# Patient Record
Sex: Male | Born: 1990 | Race: White | Hispanic: No | Marital: Single | Smoking: Former smoker
Health system: Southern US, Community
[De-identification: ages and names within clinical notes are randomized; demographics above are authoritative.]

## PROBLEM LIST (undated history)

## (undated) ENCOUNTER — Ambulatory Visit: Payer: Self-pay

## (undated) DIAGNOSIS — F988 Other specified behavioral and emotional disorders with onset usually occurring in childhood and adolescence: Secondary | ICD-10-CM

## (undated) DIAGNOSIS — Z789 Other specified health status: Secondary | ICD-10-CM

## (undated) DIAGNOSIS — F1910 Other psychoactive substance abuse, uncomplicated: Secondary | ICD-10-CM

## (undated) HISTORY — PX: NO PAST SURGERIES: SHX2092

---
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
## 2011-02-25 NOTE — Op Note (Signed)
NAMENIEVES, BARBERI               ACCOUNT NO.:  [id number]   MEDICAL RECORD NO.:  [id number]          PATIENT TYPE:  AMB   LOCATION:  DSC                          FACILITY:  [facility]   PHYSICIAN:  Etter Sjogren, M.D.     DATE OF BIRTH:  13-May-1991   DATE OF PROCEDURE:  07/23/2004  DATE OF DISCHARGE:                                 OPERATIVE REPORT   PREOPERATIVE DIAGNOSIS:  Lesion of undetermined behavior skin greater than  0.5 cm.   POSTOPERATIVE DIAGNOSES:  1.  Lesion of undetermined behavior of skin greater than 0.5 cm.  2.  Complicated wound of the lip greater than 1.0 cm.   PROCEDURES:  1.  Excision of lesion of undetermined behavior, lip, greater than 0.5 cm.  2.  Complex closure of greater than 1.0 cm.   SURGEON:  Etter Sjogren, M.D.   ANESTHESIA:  1% Xylocaine with epinephrine plus bicarbonate.   BRIEF HISTORY:  This is a 20 year old with a pigmented lesion that has  enlarged and his dermatologist recommended excision.  The nature of the  procedure and risks were well understood by the mother including scarring  and the possibility of further surgery depending upon healing as well as  final pathology report.  They wish to proceed.   DESCRIPTION OF PROCEDURE:  The patient was placed supine. He was prepped  with Betadine and draped with sterile drapes.  Local anesthesia was  performed.  The excision was performed stopping short of the vermilion  border up the lip.  Thorough irrigation and layered closure with 5-0  Monocryl interrupted deep sutures and 5-0 Monocryl interrupted deep dermal  sutures and 6-0 Prolene simple interrupted sutures.  Antibiotic ointment was  applied.  The patient tolerated the procedure well.  He will be checked in  the office next week.       DB/MEDQ  D:  07/23/2004  T:  07/23/2004  Job:  [id number]

## 2011-02-25 NOTE — Op Note (Signed)
NAMEGEOVANNIE, Brett Sanford               ACCOUNT NO.:  [id number]   MEDICAL RECORD NO.:  [id number]          PATIENT TYPE:  AMB   LOCATION:  DSC                          FACILITY:  [facility]   PHYSICIAN:  Etter Sjogren, M.D.     DATE OF BIRTH:  1991-06-04   DATE OF PROCEDURE:  01/07/2005  DATE OF DISCHARGE:                                 OPERATIVE REPORT   PREOPERATIVE DIAGNOSES:  1.  Lesion, neck, undetermined behavior, pigmented, greater than 0.5 cm.  2.  Lesion, abdomen, 0.5 cm, pigmented, undetermined behavior.   POSTOPERATIVE DIAGNOSES:  1.  Lesion, neck, undetermined behavior, pigmented, greater than 0.5 cm.  2.  Lesion, abdomen, 0.5 cm, pigmented, undetermined behavior.  3.  Complicated open wound, neck, 1.8 cm.  4.  Complicated wound, abdomen, 2.0 cm.   PROCEDURES PERFORMED:  1.  Excision lesion, neck, undetermined behavior, greater than 0.5 cm.  2.  Excision lesion, abdomen, undetermined behavior, 0.5 cm.  3.  Complex wound closure, neck, 1.8 cm.  4.  Complex wound closure, abdomen, 2.0 cm.   ANESTHESIA:  1% Xylocaine with epinephrine plus bicarb.   CLINICAL NOTE:  This 20 year old boy has been seen by his pediatrician with  two pigmented lesions that have enlarged and changed and he has been  recommended that these be excised.  Procedure and risks were discussed with  is mother, and she understood those risks and wished to proceed.   DESCRIPTION OF PROCEDURE:  The patient  was placed supine.  He was prepped  with Betadine and draped with sterile drapes.  Satisfactory local anesthesia  was achieved.  Elliptical incisions were performed.  The wounds were  irrigated thoroughly and excellent hemostasis had been confirmed.  Layered  closure was performed, using 5-0 Monocryl interrupted deep sutures, 5-0  Monocryl interrupted inverted dermal sutures and 6-0 Prolene simple running  suture.  Antibiotic ointment and dry sterile dressing were applied.  The  patient tolerated the  procedure well.   DISPOSITION:  Recheck in six to eight weeks in the office.      DB/MEDQ  D:  01/07/2005  T:  01/07/2005  Job:  [id number]

## 2011-03-09 ENCOUNTER — Emergency Department (HOSPITAL_COMMUNITY)
Admission: EM | Admit: 2011-03-09 | Discharge: 2011-03-10 | Disposition: A | Discharge status: Home / Self Care | Payer: BC Managed Care – PPO | Attending: Emergency Medicine | Admitting: Emergency Medicine

## 2011-03-09 DIAGNOSIS — R55 Syncope and collapse: Secondary | ICD-10-CM | POA: Insufficient documentation

## 2011-03-09 DIAGNOSIS — T50904A Poisoning by unspecified drugs, medicaments and biological substances, undetermined, initial encounter: Secondary | ICD-10-CM | POA: Insufficient documentation

## 2011-03-09 DIAGNOSIS — R51 Headache: Secondary | ICD-10-CM | POA: Insufficient documentation

## 2011-03-09 DIAGNOSIS — T50901A Poisoning by unspecified drugs, medicaments and biological substances, accidental (unintentional), initial encounter: Secondary | ICD-10-CM | POA: Insufficient documentation

## 2011-03-09 LAB — CBC
HCT: 40.1 % (ref 39.0–52.0)
Hemoglobin: 13.7 g/dL (ref 13.0–17.0)
MCH: 29.7 pg (ref 26.0–34.0)
MCHC: 34.2 g/dL (ref 30.0–36.0)
MCV: 87 fL (ref 78.0–100.0)
Platelets: 259 10*3/uL (ref 150–400)
RBC: 4.61 MIL/uL (ref 4.22–5.81)
RDW: 12.8 % (ref 11.5–15.5)
WBC: 11.5 10*3/uL — ABNORMAL HIGH (ref 4.0–10.5)

## 2011-03-09 LAB — RAPID URINE DRUG SCREEN, HOSP PERFORMED
Amphetamines: NOT DETECTED
Barbiturates: NOT DETECTED
Benzodiazepines: NOT DETECTED
Cocaine: NOT DETECTED
Opiates: NOT DETECTED
Tetrahydrocannabinol: NOT DETECTED

## 2011-03-09 LAB — DIFFERENTIAL
Basophils Absolute: 0 10*3/uL (ref 0.0–0.1)
Basophils Relative: 0 % (ref 0–1)
Eosinophils Absolute: 0.1 10*3/uL (ref 0.0–0.7)
Eosinophils Relative: 1 % (ref 0–5)
Lymphocytes Relative: 14 % (ref 12–46)
Lymphs Abs: 1.6 10*3/uL (ref 0.7–4.0)
Monocytes Absolute: 0.9 10*3/uL (ref 0.1–1.0)
Monocytes Relative: 8 % (ref 3–12)
Neutro Abs: 8.9 10*3/uL — ABNORMAL HIGH (ref 1.7–7.7)
Neutrophils Relative %: 77 % (ref 43–77)

## 2011-03-09 LAB — POCT I-STAT, CHEM 8
BUN: 14 mg/dL (ref 6–23)
Calcium, Ion: 1.2 mmol/L (ref 1.12–1.32)
Chloride: 103 mEq/L (ref 96–112)
Creatinine, Ser: 1.3 mg/dL (ref 0.4–1.5)
Glucose, Bld: 85 mg/dL (ref 70–99)
HCT: 42 % (ref 39.0–52.0)
Hemoglobin: 14.3 g/dL (ref 13.0–17.0)
Potassium: 4.4 mEq/L (ref 3.5–5.1)
Sodium: 139 mEq/L (ref 135–145)
TCO2: 26 mmol/L (ref 0–100)

## 2011-05-17 ENCOUNTER — Emergency Department (HOSPITAL_COMMUNITY)
Admission: EM | Admit: 2011-05-17 | Discharge: 2011-05-17 | Disposition: A | Discharge status: Home / Self Care | Payer: BC Managed Care – PPO | Attending: Emergency Medicine | Admitting: Emergency Medicine

## 2011-05-17 DIAGNOSIS — F988 Other specified behavioral and emotional disorders with onset usually occurring in childhood and adolescence: Secondary | ICD-10-CM | POA: Insufficient documentation

## 2011-05-17 DIAGNOSIS — F3289 Other specified depressive episodes: Secondary | ICD-10-CM | POA: Insufficient documentation

## 2011-05-17 DIAGNOSIS — F329 Major depressive disorder, single episode, unspecified: Secondary | ICD-10-CM | POA: Insufficient documentation

## 2011-05-17 DIAGNOSIS — Z1389 Encounter for screening for other disorder: Secondary | ICD-10-CM | POA: Insufficient documentation

## 2011-05-17 LAB — COMPREHENSIVE METABOLIC PANEL
ALT: 49 U/L (ref 0–53)
AST: 39 U/L — ABNORMAL HIGH (ref 0–37)
Albumin: 4.3 g/dL (ref 3.5–5.2)
Alkaline Phosphatase: 69 U/L (ref 39–117)
BUN: 19 mg/dL (ref 6–23)
CO2: 29 mEq/L (ref 19–32)
Calcium: 9.9 mg/dL (ref 8.4–10.5)
Chloride: 103 mEq/L (ref 96–112)
Creatinine, Ser: 0.93 mg/dL (ref 0.50–1.35)
GFR calc Af Amer: >60 mL/min (ref >60)
GFR calc non Af Amer: >60 mL/min (ref >60)
Glucose, Bld: 96 mg/dL (ref 70–99)
Potassium: 3.9 mEq/L (ref 3.5–5.1)
Sodium: 140 mEq/L (ref 135–145)
Total Bilirubin: 0.4 mg/dL (ref 0.3–1.2)
Total Protein: 7.1 g/dL (ref 6.0–8.3)

## 2011-05-17 LAB — RAPID URINE DRUG SCREEN, HOSP PERFORMED
Amphetamines: NOT DETECTED
Barbiturates: NOT DETECTED
Benzodiazepines: NOT DETECTED
Cocaine: NOT DETECTED
Opiates: NOT DETECTED
Tetrahydrocannabinol: NOT DETECTED

## 2011-05-17 LAB — DIFFERENTIAL
Basophils Absolute: 0 10*3/uL (ref 0.0–0.1)
Basophils Relative: 1 % (ref 0–1)
Eosinophils Absolute: 0.2 10*3/uL (ref 0.0–0.7)
Eosinophils Relative: 3 % (ref 0–5)
Lymphocytes Relative: 35 % (ref 12–46)
Lymphs Abs: 2.3 10*3/uL (ref 0.7–4.0)
Monocytes Absolute: 0.7 10*3/uL (ref 0.1–1.0)
Monocytes Relative: 11 % (ref 3–12)
Neutro Abs: 3.2 10*3/uL (ref 1.7–7.7)
Neutrophils Relative %: 50 % (ref 43–77)

## 2011-05-17 LAB — CBC
HCT: 39.5 % (ref 39.0–52.0)
Hemoglobin: 13.8 g/dL (ref 13.0–17.0)
MCH: 30.5 pg (ref 26.0–34.0)
MCHC: 34.9 g/dL (ref 30.0–36.0)
MCV: 87.4 fL (ref 78.0–100.0)
Platelets: 255 10*3/uL (ref 150–400)
RBC: 4.52 MIL/uL (ref 4.22–5.81)
RDW: 12.4 % (ref 11.5–15.5)
WBC: 6.5 10*3/uL (ref 4.0–10.5)

## 2011-05-17 LAB — ETHANOL: Alcohol, Ethyl (B): <11 mg/dL (ref 0–11)

## 2012-07-28 ENCOUNTER — Encounter (HOSPITAL_COMMUNITY): Payer: Self-pay | Admitting: *Deleted

## 2012-07-28 ENCOUNTER — Emergency Department (HOSPITAL_COMMUNITY)
Admission: EM | Admit: 2012-07-28 | Discharge: 2012-07-29 | Disposition: A | Discharge status: Home / Self Care | Payer: BC Managed Care – PPO | Attending: Emergency Medicine | Admitting: Emergency Medicine

## 2012-07-28 DIAGNOSIS — S161XXA Strain of muscle, fascia and tendon at neck level, initial encounter: Secondary | ICD-10-CM

## 2012-07-28 DIAGNOSIS — M542 Cervicalgia: Secondary | ICD-10-CM | POA: Insufficient documentation

## 2012-07-28 DIAGNOSIS — S239XXA Sprain of unspecified parts of thorax, initial encounter: Secondary | ICD-10-CM | POA: Insufficient documentation

## 2012-07-28 DIAGNOSIS — IMO0002 Reserved for concepts with insufficient information to code with codable children: Secondary | ICD-10-CM

## 2012-07-28 DIAGNOSIS — S139XXA Sprain of joints and ligaments of unspecified parts of neck, initial encounter: Secondary | ICD-10-CM | POA: Insufficient documentation

## 2012-07-28 DIAGNOSIS — S8000XA Contusion of unspecified knee, initial encounter: Secondary | ICD-10-CM | POA: Insufficient documentation

## 2012-07-28 DIAGNOSIS — Y92410 Unspecified street and highway as the place of occurrence of the external cause: Secondary | ICD-10-CM | POA: Insufficient documentation

## 2012-07-28 HISTORY — DX: Other specified behavioral and emotional disorders with onset usually occurring in childhood and adolescence: F98.8

## 2012-07-28 HISTORY — DX: Other psychoactive substance abuse, uncomplicated: F19.10
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
## 2012-07-29 ENCOUNTER — Emergency Department (HOSPITAL_COMMUNITY): Payer: BC Managed Care – PPO

## 2012-07-29 ENCOUNTER — Encounter (HOSPITAL_COMMUNITY): Payer: Self-pay | Admitting: Radiology

## 2012-07-29 MED ORDER — NAPROXEN 375 MG PO TABS: 375.0000 mg | ORAL_TABLET | Freq: Two times a day (BID) | ORAL | Status: DC

## 2012-07-29 MED ORDER — TRAMADOL HCL 50 MG PO TABS: 50.0000 mg | ORAL_TABLET | Freq: Four times a day (QID) | ORAL | Status: DC | PRN

## 2012-07-29 MED ORDER — KETOROLAC TROMETHAMINE 30 MG/ML IJ SOLN
30.0000 mg | Freq: Once | INTRAMUSCULAR | Status: AC
  Administered 2012-07-29: 30 mg via INTRAMUSCULAR
  Filled 2012-07-29: qty 1

## 2012-07-29 MED ORDER — TRAMADOL HCL 50 MG PO TABS
50.0000 mg | ORAL_TABLET | Freq: Once | ORAL | Status: AC
  Administered 2012-07-29: 50 mg via ORAL
  Filled 2012-07-29: qty 1
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
## 2016-10-07 ENCOUNTER — Encounter (HOSPITAL_COMMUNITY): Payer: Self-pay | Admitting: Emergency Medicine

## 2016-10-07 DIAGNOSIS — F17200 Nicotine dependence, unspecified, uncomplicated: Secondary | ICD-10-CM | POA: Diagnosis not present

## 2016-10-07 DIAGNOSIS — R935 Abnormal findings on diagnostic imaging of other abdominal regions, including retroperitoneum: Secondary | ICD-10-CM | POA: Diagnosis not present

## 2016-10-07 DIAGNOSIS — R109 Unspecified abdominal pain: Secondary | ICD-10-CM | POA: Insufficient documentation

## 2016-10-07 DIAGNOSIS — F909 Attention-deficit hyperactivity disorder, unspecified type: Secondary | ICD-10-CM | POA: Diagnosis not present

## 2016-10-07 LAB — BASIC METABOLIC PANEL
ANION GAP: 9 (ref 5–15)
BUN: 11 mg/dL (ref 6–20)
CALCIUM: 9.7 mg/dL (ref 8.9–10.3)
CO2: 27 mmol/L (ref 22–32)
Chloride: 104 mmol/L (ref 101–111)
Creatinine, Ser: 0.92 mg/dL (ref 0.61–1.24)
Glucose, Bld: 100 mg/dL — ABNORMAL HIGH (ref 65–99)
POTASSIUM: 3.7 mmol/L (ref 3.5–5.1)
SODIUM: 140 mmol/L (ref 135–145)

## 2016-10-07 LAB — CBC WITH DIFFERENTIAL/PLATELET
BASOS ABS: 0 10*3/uL (ref 0.0–0.1)
BASOS PCT: 0 %
EOS PCT: 1 %
Eosinophils Absolute: 0.1 10*3/uL (ref 0.0–0.7)
HCT: 44.1 % (ref 39.0–52.0)
Hemoglobin: 15.4 g/dL (ref 13.0–17.0)
Lymphocytes Relative: 16 %
Lymphs Abs: 1.4 10*3/uL (ref 0.7–4.0)
MCH: 31 pg (ref 26.0–34.0)
MCHC: 34.9 g/dL (ref 30.0–36.0)
MCV: 88.7 fL (ref 78.0–100.0)
MONO ABS: 0.9 10*3/uL (ref 0.1–1.0)
Monocytes Relative: 10 %
Neutro Abs: 6.5 10*3/uL (ref 1.7–7.7)
Neutrophils Relative %: 73 %
PLATELETS: 266 10*3/uL (ref 150–400)
RBC: 4.97 MIL/uL (ref 4.22–5.81)
RDW: 12.8 % (ref 11.5–15.5)
WBC: 9 10*3/uL (ref 4.0–10.5)

## 2016-10-07 LAB — URINALYSIS, ROUTINE W REFLEX MICROSCOPIC
Bilirubin Urine: NEGATIVE
GLUCOSE, UA: NEGATIVE mg/dL
HGB URINE DIPSTICK: NEGATIVE
KETONES UR: NEGATIVE mg/dL
LEUKOCYTES UA: NEGATIVE
Nitrite: NEGATIVE
PROTEIN: NEGATIVE mg/dL
Specific Gravity, Urine: 1.027 (ref 1.005–1.030)
pH: 5 (ref 5.0–8.0)

## 2016-10-07 NOTE — ED Triage Notes (Signed)
Patient here from home with complaints of right sided flank pain that started yesterday, increased today. Pain 10/10. Hx of same. Ibuprofen with no relief.

## 2016-10-08 ENCOUNTER — Emergency Department (HOSPITAL_COMMUNITY)
Admission: EM | Admit: 2016-10-08 | Discharge: 2016-10-08 | Disposition: A | Discharge status: Home / Self Care | Payer: BC Managed Care – PPO | Attending: Emergency Medicine | Admitting: Emergency Medicine

## 2016-10-08 ENCOUNTER — Emergency Department (HOSPITAL_COMMUNITY): Payer: BC Managed Care – PPO

## 2016-10-08 DIAGNOSIS — R109 Unspecified abdominal pain: Secondary | ICD-10-CM

## 2016-10-08 NOTE — ED Provider Notes (Signed)
[facility]-EMERGENCY DEPT Provider Note   CSN: [id number] Arrival date & time: 10/07/16  1957   By signing my name below, I, Brett Sanford, attest that this documentation has been prepared under the direction and in the presence of Tomasita CrumbleAdeleke Yitzel Shasteen, MD. Electronically signed, Brett Sanford, ED Scribe. 10/08/16. 2:28 AM.   History   Chief Complaint Chief Complaint  Patient presents with  . Flank Pain   The history is provided by the patient and medical records. No language interpreter was used.    HPI Comments: Brett Sanford is a 25 y.o. male who presents to the Emergency Department complaining of rapidly worsening right flank pain starting yesterday. Pt states his pain became 10/10 today. Upon waking. He believes he may have a kidney stone. Notes Hx of same. Per triage, pt has taken ibuprofen at home with no relief. Reports subjective fever and vomit 2 days ago. Pt denies dysuria, nausea or hematuria.  Past Medical History:  Diagnosis Date  . ADD (attention deficit disorder)   . Drug abuse     There are no active problems to display for this patient.   History reviewed. No pertinent surgical history.     Home Medications    Prior to Admission medications   Medication Sig Start Date End Date Taking? Authorizing Provider  ibuprofen (ADVIL,MOTRIN) 200 MG tablet Take 400 mg by mouth every 6 (six) hours as needed for headache, mild pain or moderate pain.   Yes Historical Provider, MD    Family History History reviewed. No pertinent family history.  Social History Social History  Substance Use Topics  . Smoking status: Current Every Day Smoker  . Smokeless tobacco: Never Used  . Alcohol use Yes     Allergies   Benadryl [diphenhydramine hcl]   Review of Systems Review of Systems  All other systems reviewed and are negative. A complete 10 system review of systems was obtained and all systems are negative except as noted in the HPI and PMH.     Physical Exam Updated  Vital Signs BP (!) 177/105 (BP Location: Left Arm)   Pulse 94   Temp 98.4 F (36.9 C) (Oral)   Resp 18   Wt 203 lb 8 oz (92.3 kg)   SpO2 99%   Physical Exam  Constitutional: He is oriented to person, place, and time. Vital signs are normal. He appears well-developed and well-nourished.  Non-toxic appearance. He does not appear ill. No distress.  HENT:  Head: Normocephalic and atraumatic.  Nose: Nose normal.  Mouth/Throat: Oropharynx is clear and moist. No oropharyngeal exudate.  Eyes: Conjunctivae and EOM are normal. Pupils are equal, round, and reactive to light. No scleral icterus.  Neck: Normal range of motion. Neck supple. No tracheal deviation, no edema, no erythema and normal range of motion present. No thyroid mass and no thyromegaly present.  Cardiovascular: Normal rate, regular rhythm, S1 normal, S2 normal, normal heart sounds, intact distal pulses and normal pulses.  Exam reveals no gallop and no friction rub.   No murmur heard. Pulmonary/Chest: Effort normal and breath sounds normal. No respiratory distress. He has no wheezes. He has no rhonchi. He has no rales.  Abdominal: Soft. Normal appearance and bowel sounds are normal. He exhibits no distension, no ascites and no mass. There is no hepatosplenomegaly. There is no tenderness. There is no rebound, no guarding and no CVA tenderness.  Musculoskeletal: Normal range of motion. He exhibits no edema or tenderness.  Lymphadenopathy:    He has no cervical  adenopathy.  Neurological: He is alert and oriented to person, place, and time. He has normal strength. No cranial nerve deficit or sensory deficit.  Skin: Skin is warm, dry and intact. No petechiae and no rash noted. He is not diaphoretic. No erythema. No pallor.  Nursing note and vitals reviewed.    ED Treatments / Results  DIAGNOSTIC STUDIES: Oxygen Saturation is 99% on RA, normal by my interpretation.    COORDINATION OF CARE: 2:28 AM Discussed treatment plan with pt at  bedside and pt agreed to plan.  Labs (all labs ordered are listed, but only abnormal results are displayed) Labs Reviewed  BASIC METABOLIC PANEL - Abnormal; Notable for the following:       Result Value   Glucose, Bld 100 (*)    All other components within normal limits  URINALYSIS, ROUTINE W REFLEX MICROSCOPIC  CBC WITH DIFFERENTIAL/PLATELET    EKG  EKG Interpretation None       Radiology [place] Renal  Result Date: 10/08/2016 CLINICAL DATA:  Right flank pain. EXAM: RENAL / URINARY TRACT ULTRASOUND COMPLETE COMPARISON:  None. FINDINGS: Right Kidney: Length: 9.4 cm. Echogenicity within normal limits. No mass or hydronephrosis visualized. No shadowing stone. Left Kidney: Length: 10.7 cm. Echogenicity within normal limits. No mass or hydronephrosis visualized. No shadowing stone. Bladder: Appears normal for degree of bladder distention. Both ureteral jets are visualized. IMPRESSION: Normal renal ultrasound. No hydronephrosis. Both ureteral jets are seen. Electronically Signed   By: Rubye OaksMelanie  Ehinger M.D.   On: 10/08/2016 03:32    Procedures Procedures (including critical care time)  Medications Ordered in ED Medications - No data to display   Initial Impression / Assessment and Plan / ED Course  I have reviewed the triage vital signs and the nursing notes.  Pertinent labs & imaging results that were available during my care of the patient were reviewed by me and considered in my medical decision making (see chart for details).  Will order US of the abdomen.  Clinical Course    Patient presents to the ED for flank pain and is concerned for kidney stone.  He was sleeping when I first entered the room and did not appear in acute pain throughout my interview and exam.  Labs and urine are normal.  Will obtain US to assess for any stone or hydro.     3:54 AM US neg for evidence of kidneyu stones.  Patient asking for pain medication at home.  Advised on tylenol or ibuprofen.  PCP fu  advised within 3 days.  He appears well and in NAD.  Vs remain within his normal limits and he I ssafe for DC.   Final Clinical Impressions(s) / ED Diagnoses   Final diagnoses:  Right flank pain    New Prescriptions New Prescriptions   No medications on file    I personally performed the services described in this documentation, which was scribed in my presence. The recorded information has been reviewed and is accurate.      Tomasita CrumbleAdeleke Lillyian Heidt, MD 10/08/16 [phone]

## 2018-05-18 ENCOUNTER — Encounter: Payer: Self-pay | Admitting: Emergency Medicine

## 2018-05-18 ENCOUNTER — Emergency Department
Admission: EM | Admit: 2018-05-18 | Discharge: 2018-05-18 | Disposition: A | Discharge status: Home / Self Care | Payer: BC Managed Care – PPO | Attending: Emergency Medicine | Admitting: Emergency Medicine

## 2018-05-18 ENCOUNTER — Other Ambulatory Visit: Payer: Self-pay

## 2018-05-18 DIAGNOSIS — F1210 Cannabis abuse, uncomplicated: Secondary | ICD-10-CM | POA: Insufficient documentation

## 2018-05-18 DIAGNOSIS — F17200 Nicotine dependence, unspecified, uncomplicated: Secondary | ICD-10-CM | POA: Insufficient documentation

## 2018-05-18 DIAGNOSIS — L089 Local infection of the skin and subcutaneous tissue, unspecified: Secondary | ICD-10-CM | POA: Insufficient documentation

## 2018-05-18 MED ORDER — BACITRACIN-NEOMYCIN-POLYMYXIN 400-5-5000 EX OINT: 1.0000 "application " | TOPICAL_OINTMENT | Freq: Two times a day (BID) | CUTANEOUS | 0 refills | Status: DC

## 2018-05-18 MED ORDER — BACITRACIN-NEOMYCIN-POLYMYXIN 400-5-5000 EX OINT
TOPICAL_OINTMENT | Freq: Once | CUTANEOUS | Status: AC
  Administered 2018-05-18: 1 via TOPICAL
  Filled 2018-05-18: qty 1
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
## 2018-05-18 NOTE — ED Notes (Signed)

## 2018-05-18 NOTE — ED Notes (Signed)
Small papule on left knee isnce yesterday.  Says history of mrsa years ago.
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
## 2018-05-18 NOTE — ED Triage Notes (Signed)
First nurse note: pt outside smoking at entrance of lobby prior to coming into check in

## 2018-05-29 ENCOUNTER — Emergency Department (HOSPITAL_COMMUNITY): Payer: Self-pay

## 2018-05-29 ENCOUNTER — Encounter (HOSPITAL_COMMUNITY): Payer: Self-pay | Admitting: Emergency Medicine

## 2018-05-29 ENCOUNTER — Emergency Department (HOSPITAL_COMMUNITY)
Admission: EM | Admit: 2018-05-29 | Discharge: 2018-05-29 | Disposition: A | Discharge status: Home / Self Care | Payer: Self-pay | Attending: Emergency Medicine | Admitting: Emergency Medicine

## 2018-05-29 ENCOUNTER — Other Ambulatory Visit: Payer: Self-pay

## 2018-05-29 DIAGNOSIS — M79641 Pain in right hand: Secondary | ICD-10-CM | POA: Insufficient documentation

## 2018-05-29 DIAGNOSIS — F1210 Cannabis abuse, uncomplicated: Secondary | ICD-10-CM | POA: Insufficient documentation

## 2018-05-29 DIAGNOSIS — F17200 Nicotine dependence, unspecified, uncomplicated: Secondary | ICD-10-CM | POA: Insufficient documentation

## 2018-05-29 MED ORDER — IBUPROFEN 400 MG PO TABS
600.0000 mg | ORAL_TABLET | Freq: Once | ORAL | Status: AC
  Administered 2018-05-29: 600 mg via ORAL
  Filled 2018-05-29: qty 1

## 2018-05-29 MED ORDER — IBUPROFEN 600 MG PO TABS: 600.0000 mg | ORAL_TABLET | Freq: Four times a day (QID) | ORAL | 0 refills | Status: DC | PRN

## 2018-05-29 NOTE — ED Triage Notes (Signed)
Pt reports hitting his hand against a table. Pt complains of pain and unable to move wrist without pain. No obvious deformity.

## 2018-05-29 NOTE — ED Notes (Signed)
Signature pad unavailable at time of pt discharge. Pt verbalized understanding of d/c instructions. Pt denied any further requests. Confirmed pharmacy with patient.

## 2018-05-29 NOTE — ED Provider Notes (Addendum)
[facility] Provider Note   CSN: [id number] Arrival date & time: 05/29/18  1827     History   Chief Complaint Chief Complaint  Patient presents with  . Hand Injury    HPI Brett Sanford is a 27 y.o. male with history of drug abuse who presents emergency department today for right hand pain.  Patient reports that his hand on a table earlier this evening.  He is not having pain at the base of his third and fourth digits on the right hand.  He reports it is worse when he moves his wrist but he denies any wrist pain.  There is no open wounds.  He denies any joint swelling, erythema, heat, fevers at home.  He denies any IV drug use in this area.  Patient denies any numbness/tingling/weakness.  He has tried ice for his symptoms with mild relief.    HPI  Past Medical History:  Diagnosis Date  . ADD (attention deficit disorder)   . Drug abuse (HCC)     There are no active problems to display for this patient.   History reviewed. No pertinent surgical history.      Home Medications    Prior to Admission medications   Medication Sig Start Date End Date Taking? Authorizing Provider  ibuprofen (ADVIL,MOTRIN) 200 MG tablet Take 400 mg by mouth every 6 (six) hours as needed for headache, mild pain or moderate pain.    [provider]  neomycin-bacitracin-polymyxin (NEOSPORIN) ointment Apply 1 application topically every 12 (twelve) hours. 05/18/18   Enid DerryWagner, Ashley, PA-C    Family History History reviewed. No pertinent family history.  Social History Social History   Tobacco Use  . Smoking status: Current Every Day Smoker  . Smokeless tobacco: Never Used  Substance Use Topics  . Alcohol use: Yes  . Drug use: Yes    Types: Marijuana     Allergies   Benadryl [diphenhydramine hcl]   Review of Systems Review of Systems  Constitutional: Negative for fever.  Musculoskeletal: Positive for arthralgias. Negative for joint  swelling.  Skin: Negative for color change and wound.  Neurological: Negative for weakness and numbness.     Physical Exam Updated Vital Signs BP (!) 154/85   Pulse 83   Temp 99.2 F (37.3 C) (Oral)   Resp 16   Ht 5\' 8"  (1.727 m)   Wt 66.7 kg   SpO2 100%   BMI 22.35 kg/m   Physical Exam  Constitutional: He appears well-developed and well-nourished.  HENT:  Head: Normocephalic and atraumatic.  Right Ear: External ear normal.  Left Ear: External ear normal.  Eyes: Conjunctivae are normal. Right eye exhibits no discharge. Left eye exhibits no discharge. No scleral icterus.  Cardiovascular:  Pulses:      Radial pulses are 2+ on the right side.  Pulmonary/Chest: Effort normal. No respiratory distress.  Musculoskeletal:       Right wrist: Normal. He exhibits normal range of motion, no tenderness, no bony tenderness and no swelling.       Right hand: He exhibits bony tenderness. He exhibits normal range of motion, normal two-point discrimination, normal capillary refill and no swelling. Normal sensation noted. Normal strength noted.       Hands: Right hand: No gross deformities, skin intact. Fingers appear normal. No TTP over flexor sheath. TTP as indicated in diagramn. No snuffbox TTP. Finger adduction/abduction intact with 5/5 strength.  Thumb opposition intact. Full active and resisted ROM to flexion/extension  at wrist, MCP, PIP and DIP of all fingers.  FDS/FDP intact. Radial artery 2+ with <2sec cap refill. SILT in M/U/R distributions.  Grip 5/5 strength.    Neurological: He is alert. He has normal strength. No sensory deficit.  Skin: Skin is warm, dry and intact. Capillary refill takes less than 2 seconds. No erythema. No pallor.  No joint swelling, erythema, heat.   Psychiatric: He has a normal mood and affect.  Nursing note and vitals reviewed.    ED Treatments / Results  Labs (all labs ordered are listed, but only abnormal results are displayed) Labs Reviewed - No data  to display  EKG None  Radiology Dg Hand Complete Right  Result Date: 05/29/2018 CLINICAL DATA:  Hand injury EXAM: RIGHT HAND - COMPLETE 3+ VIEW COMPARISON:  01/14/2004 FINDINGS: There is no evidence of fracture or dislocation. There is no evidence of arthropathy or other focal bone abnormality. Soft tissues are unremarkable. IMPRESSION: Negative. Electronically Signed   By: Jasmine PangKim  Fujinaga M.D.   On: 05/29/2018 19:02    Procedures Procedures (including critical care time)  Medications Ordered in ED Medications  ibuprofen (ADVIL,MOTRIN) tablet 600 mg (has no administration in time range)     Initial Impression / Assessment and Plan / ED Course  I have reviewed the triage vital signs and the nursing notes.  Pertinent labs & imaging results that were available during my care of the patient were reviewed by me and considered in my medical decision making (see chart for details).     27 y.o. male presenting with right hand pain (dominant) after hitting it on a table. He is NVI.  He does have a history of IV drug use but do not suspect septic joint at this time.  There is no joint swelling, overlying erythema, heat and he has normal range of motion.  He is afebrile in the department. Patient X-Ray negative for obvious fracture or dislocation. Pain managed in ED. Pt advised to follow up with orthopedics if symptoms persist for possibility of missed fracture diagnosis. Patient given brace while in ED, conservative therapy recommended and discussed. Return precautions discussed. Patient will be dc home & is agreeable with above plan.    Final Clinical Impressions(s) / ED Diagnoses   Final diagnoses:  Right hand pain    ED Discharge Orders         Ordered    ibuprofen (ADVIL,MOTRIN) 600 MG tablet  Every 6 hours PRN     05/29/18 2034           Princella PellegriniMaczis, Michael M, PA-C 05/29/18 2043    Jacinto HalimMaczis, Michael M, PA-C 05/29/18 2043    Virgina Norfolkuratolo, Adam, DO 05/29/18 2210

## 2018-05-29 NOTE — Discharge Instructions (Addendum)
Please read and follow all provided instructions.  You have been seen today for right hand pain  Tests performed today include: An x-ray of the affected area - does NOT show any broken bones or dislocations.  Vital signs. See below for your results today.   Home care instructions: -- *PRICE in the first 24-48 hours after injury: Protect (with brace, splint, sling), if given by your provider Rest Ice- Do not apply ice pack directly to your skin, place towel or similar between your skin and ice/ice pack. Apply ice for 20 min, then remove for 40 min while awake Compression- Wear brace, elastic bandage, splint as directed by your provider Elevate affected extremity above the level of your heart when not walking around for the first 24-48 hours   Use Ibuprofen (Motrin/Advil) 600mg  every 6 hours as needed for pain (do not exceed max dose in 24 hours, 2400mg )  Follow-up instructions: Please follow-up with your primary care provider or the provided orthopedic physician (bone specialist) if you continue to have significant pain in 1 week. In this case you may have a more severe injury that requires further care.   Return instructions:  Please return if your fingers or hand are numb or tingling, appear gray or blue, or you have severe pain (also elevate the leg and loosen splint or wrap if you were given one) Please return to the Emergency Department if you experience worsening symptoms.  Please return if you have any other emergent concerns. Additional Information:  Your vital signs today were: BP (!) 154/85    Pulse 83    Temp 99.2 F (37.3 C) (Oral)    Resp 16    Ht 5\' 8"  (1.727 m)    Wt 66.7 kg    SpO2 100%    BMI 22.35 kg/m  If your blood pressure (BP) was elevated above 135/85 this visit, please have this repeated by your doctor within one month. ---------------

## 2019-03-27 ENCOUNTER — Ambulatory Visit (HOSPITAL_COMMUNITY)
Admission: RE | Admit: 2019-03-27 | Discharge: 2019-03-27 | Disposition: A | Discharge status: Home / Self Care | Payer: BC Managed Care – PPO | Source: Home / Self Care | Attending: Psychiatry | Admitting: Psychiatry

## 2019-03-27 ENCOUNTER — Other Ambulatory Visit: Payer: Self-pay | Admitting: Behavioral Health

## 2019-03-27 ENCOUNTER — Emergency Department (HOSPITAL_COMMUNITY)
Admission: EM | Admit: 2019-03-27 | Discharge: 2019-03-28 | Disposition: A | Discharge status: Other Acute Inpatient Hospital | Payer: BC Managed Care – PPO | Source: Home / Self Care | Attending: Emergency Medicine | Admitting: Emergency Medicine

## 2019-03-27 ENCOUNTER — Other Ambulatory Visit: Payer: Self-pay

## 2019-03-27 DIAGNOSIS — F1010 Alcohol abuse, uncomplicated: Secondary | ICD-10-CM | POA: Insufficient documentation

## 2019-03-27 DIAGNOSIS — F17200 Nicotine dependence, unspecified, uncomplicated: Secondary | ICD-10-CM | POA: Insufficient documentation

## 2019-03-27 DIAGNOSIS — F909 Attention-deficit hyperactivity disorder, unspecified type: Secondary | ICD-10-CM | POA: Insufficient documentation

## 2019-03-27 DIAGNOSIS — F1020 Alcohol dependence, uncomplicated: Secondary | ICD-10-CM | POA: Insufficient documentation

## 2019-03-27 DIAGNOSIS — R45851 Suicidal ideations: Secondary | ICD-10-CM

## 2019-03-27 DIAGNOSIS — F332 Major depressive disorder, recurrent severe without psychotic features: Secondary | ICD-10-CM | POA: Insufficient documentation

## 2019-03-27 DIAGNOSIS — F1910 Other psychoactive substance abuse, uncomplicated: Secondary | ICD-10-CM

## 2019-03-27 DIAGNOSIS — F1210 Cannabis abuse, uncomplicated: Secondary | ICD-10-CM | POA: Insufficient documentation

## 2019-03-27 DIAGNOSIS — F1220 Cannabis dependence, uncomplicated: Secondary | ICD-10-CM | POA: Insufficient documentation

## 2019-03-27 DIAGNOSIS — Z888 Allergy status to other drugs, medicaments and biological substances status: Secondary | ICD-10-CM | POA: Insufficient documentation

## 2019-03-27 DIAGNOSIS — F329 Major depressive disorder, single episode, unspecified: Secondary | ICD-10-CM | POA: Insufficient documentation

## 2019-03-27 DIAGNOSIS — Z20828 Contact with and (suspected) exposure to other viral communicable diseases: Secondary | ICD-10-CM | POA: Insufficient documentation

## 2019-03-27 LAB — COMPREHENSIVE METABOLIC PANEL
ALT: 24 U/L (ref 0–44)
AST: 32 U/L (ref 15–41)
Albumin: 4.6 g/dL (ref 3.5–5.0)
Alkaline Phosphatase: 50 U/L (ref 38–126)
Anion gap: 12 (ref 5–15)
BUN: 10 mg/dL (ref 6–20)
CO2: 25 mmol/L (ref 22–32)
Calcium: 9.3 mg/dL (ref 8.9–10.3)
Chloride: 106 mmol/L (ref 98–111)
Creatinine, Ser: 0.89 mg/dL (ref 0.61–1.24)
GFR calc Af Amer: >60 mL/min (ref >60)
GFR calc non Af Amer: >60 mL/min (ref >60)
Glucose, Bld: 98 mg/dL (ref 70–99)
Potassium: 4.1 mmol/L (ref 3.5–5.1)
Sodium: 143 mmol/L (ref 135–145)
Total Bilirubin: 1.6 mg/dL — ABNORMAL HIGH (ref 0.3–1.2)
Total Protein: 7.5 g/dL (ref 6.5–8.1)

## 2019-03-27 LAB — CBC
HCT: 43.9 % (ref 39.0–52.0)
Hemoglobin: 14.3 g/dL (ref 13.0–17.0)
MCH: 31.2 pg (ref 26.0–34.0)
MCHC: 32.6 g/dL (ref 30.0–36.0)
MCV: 95.6 fL (ref 80.0–100.0)
Platelets: 229 10*3/uL (ref 150–400)
RBC: 4.59 MIL/uL (ref 4.22–5.81)
RDW: 12 % (ref 11.5–15.5)
WBC: 5.7 10*3/uL (ref 4.0–10.5)
nRBC: 0 % (ref 0.0–0.2)

## 2019-03-27 LAB — RAPID URINE DRUG SCREEN, HOSP PERFORMED
Amphetamines: NOT DETECTED
Barbiturates: NOT DETECTED
Benzodiazepines: NOT DETECTED
Cocaine: NOT DETECTED
Opiates: NOT DETECTED
Tetrahydrocannabinol: POSITIVE — AB

## 2019-03-27 LAB — ETHANOL: Alcohol, Ethyl (B): 97 mg/dL — ABNORMAL HIGH (ref <10)

## 2019-03-27 MED ORDER — THIAMINE HCL 100 MG/ML IJ SOLN: 100.0000 mg | Freq: Every day | INTRAMUSCULAR | Status: DC

## 2019-03-27 MED ORDER — LORAZEPAM 2 MG/ML IJ SOLN: 0.0000 mg | Freq: Two times a day (BID) | INTRAMUSCULAR | Status: DC

## 2019-03-27 MED ORDER — LORAZEPAM 1 MG PO TABS: 0.0000 mg | ORAL_TABLET | Freq: Two times a day (BID) | ORAL | Status: DC

## 2019-03-27 MED ORDER — LORAZEPAM 1 MG PO TABS
0.0000 mg | ORAL_TABLET | Freq: Four times a day (QID) | ORAL | Status: DC
  Administered 2019-03-27: 19:00:00 2 mg via ORAL
  Administered 2019-03-27: 1 mg via ORAL
  Filled 2019-03-27: qty 1
  Filled 2019-03-27: qty 2

## 2019-03-27 MED ORDER — NICOTINE 21 MG/24HR TD PT24
21.0000 mg | MEDICATED_PATCH | Freq: Every day | TRANSDERMAL | Status: DC
  Administered 2019-03-27 – 2019-03-28 (×2): 21 mg via TRANSDERMAL
  Filled 2019-03-27 (×3): qty 1

## 2019-03-27 MED ORDER — LORAZEPAM 2 MG/ML IJ SOLN: 0.0000 mg | Freq: Four times a day (QID) | INTRAMUSCULAR | Status: DC

## 2019-03-27 MED ORDER — VITAMIN B-1 100 MG PO TABS
100.0000 mg | ORAL_TABLET | Freq: Every day | ORAL | Status: DC
  Administered 2019-03-27 – 2019-03-28 (×2): 100 mg via ORAL
  Filled 2019-03-27 (×3): qty 1

## 2019-03-27 NOTE — H&P (Signed)
Behavioral Health Medical Screening Exam  Brett Sanford is an 28 y.o. male.who presents as a walk-in to [facility] accompanied alone. Patient endorses feeling suicidal. He states that if there was a way that he would kill himself it would be by overdosing on pills. He endorses a signficnat history of alcohol abuse. Reports drinking at least of, " 5th of Vodka dialy." He reports his last use of alcohol was today at 1 pm. Reports drinking at least a half of 5th today. He also reports daily use of mariajuana and occasional uuse of LSD and molly. Reports he has had blackouts related to his alcohol abuse and states many years ago, he had a seizure after using LSD. He denies AVH or other psychosis. Denies homicidal ideations. Reports he has a psychiatric diagnosis of depression and ADHD. Reports feeling depressed and hopeless. Reports having experienced anxiety and panic attacks. He currently has no outpatient psychiatric services.   Total Time spent with patient: 15 minutes  Psychiatric Specialty Exam: Physical Exam  Nursing note and vitals reviewed. Constitutional: He is oriented to person, place, and time.  Neurological: He is alert and oriented to person, place, and time.    Review of Systems  Psychiatric/Behavioral: Positive for depression, substance abuse and suicidal ideas. Negative for hallucinations and memory loss. The patient is nervous/anxious. The patient does not have insomnia.   All other systems reviewed and are negative.   Blood pressure 139/84, pulse 95, temperature 98.5 F (36.9 C), temperature source Oral, resp. rate 18, SpO2 100 %.There is no height or weight on file to calculate BMI.  General Appearance: Fairly Groomed  Eye Contact:  Good  Speech:  Clear and Coherent and Normal Rate  Volume:  Normal  Mood:  Anxious, Depressed and Hopeless  Affect:  Congruent  Thought Process:  Coherent, Goal Directed, Linear and Descriptions of Associations: Intact  Orientation:  Full (Time,  Place, and Person)  Thought Content:  WDL  Suicidal Thoughts:  yes. no plan or intent yetstates if he does attmpt suicide it would be by way of  overdose   Homicidal Thoughts:  No  Memory:  Immediate;   Fair Recent;   Fair  Judgement:  Fair  Insight:  Fair  Psychomotor Activity:  Normal  Concentration: Concentration: Fair and Attention Span: Fair  Recall:  [employer] of Knowledge:Fair  Language: Good  Akathisia:  Negative  Handed:  Right  AIMS (if indicated):     Assets:  Communication Skills Desire for Improvement Resilience  Sleep:       Musculoskeletal: Strength & Muscle Tone: within normal limits Gait & Station: normal Patient leans: N/A  Blood pressure 139/84, pulse 95, temperature 98.5 F (36.9 C), temperature source Oral, resp. rate 18, SpO2 100 %.  Recommendations:  Based on my evaluation the patient appears to have an emergency medical condition for which I recommend the patient be transferred to the emergency department for further evaluation.   Following medical clearance, recommendation is for inpatient psychiatric hospitalization.   Mordecai Maes, NP 03/27/2019, 2:34 PM
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
## 2019-03-27 NOTE — ED Provider Notes (Addendum)
[facility] DEPT Provider Note   CSN: [id number] Arrival date & time: 03/27/19  1530    History   Chief Complaint Chief Complaint  Patient presents with  . Suicidal    HPI Brett Sanford is a 28 y.o. male.     HPI Patient presents with alcohol abuse and suicidal thoughts.  States he does not want to live anymore.  No suicide attempt.  States he is drinking around 1/5 of vodka a day.  He has been drinking heavily for a year.  States he is gone around 3 days in the last year without drinking.  States he has some tremors but no hallucinations.  Does smoke marijuana every day.  Denies other drug use consistently but states will occasionally do something such as acid. Past Medical History:  Diagnosis Date  . ADD (attention deficit disorder)   . Drug abuse ([place])     There are no active problems to display for this patient.   No past surgical history on file.      Home Medications    Prior to Admission medications   Medication Sig Start Date End Date Taking? Authorizing Provider  ibuprofen (ADVIL,MOTRIN) 600 MG tablet Take 1 tablet (600 mg total) by mouth every 6 (six) hours as needed. Patient not taking: Reported on 03/27/2019 05/29/18   Maczis, Barth Kirks, PA-C  neomycin-bacitracin-polymyxin (NEOSPORIN) ointment Apply 1 application topically every 12 (twelve) hours. Patient not taking: Reported on 03/27/2019 05/18/18   Laban Emperor, PA-C    Family History No family history on file.  Social History Social History   Tobacco Use  . Smoking status: Current Every Day Smoker  . Smokeless tobacco: Never Used  Substance Use Topics  . Alcohol use: Yes  . Drug use: Yes    Types: Marijuana     Allergies   Benadryl [diphenhydramine hcl]   Review of Systems Review of Systems  Constitutional: Negative for appetite change.  HENT: Negative for congestion.   Respiratory: Negative for shortness of breath.   Cardiovascular: Negative for chest  pain.  Gastrointestinal: Negative for abdominal pain.  Genitourinary: Negative for flank pain.  Musculoskeletal: Negative for back pain.  Skin: Negative for rash.  Neurological: Negative for weakness.  Psychiatric/Behavioral: Positive for suicidal ideas. Negative for hallucinations.     Physical Exam Updated Vital Signs BP (!) 144/84 (BP Location: Left Arm)   Pulse 83   Temp 98 F (36.7 C)   Resp 18   Ht 5\' 8"  (1.727 m)   Wt 77.1 kg   SpO2 99%   BMI 25.85 kg/m   Physical Exam Vitals signs and nursing note reviewed.  HENT:     Head: Normocephalic.  Neck:     Musculoskeletal: Neck supple.  Cardiovascular:     Rate and Rhythm: Regular rhythm.  Pulmonary:     Effort: Pulmonary effort is normal.  Abdominal:     Tenderness: There is no abdominal tenderness.  Musculoskeletal:     Right lower leg: No edema.     Left lower leg: No edema.  Skin:    General: Skin is warm.     Capillary Refill: Capillary refill takes less than 2 seconds.  Neurological:     Mental Status: He is alert.  Psychiatric:     Comments: Patient appears somewhat depressed.      ED Treatments / Results  Labs (all labs ordered are listed, but only abnormal results are displayed) Labs Reviewed  COMPREHENSIVE METABOLIC PANEL - Abnormal;  Notable for the following components:      Result Value   Total Bilirubin 1.6 (*)    All other components within normal limits  ETHANOL - Abnormal; Notable for the following components:   Alcohol, Ethyl (B) 97 (*)    All other components within normal limits  RAPID URINE DRUG SCREEN, HOSP PERFORMED - Abnormal; Notable for the following components:   Tetrahydrocannabinol POSITIVE (*)    All other components within normal limits  SARS CORONAVIRUS 2 (HOSPITAL ORDER, PERFORMED IN  HOSPITAL LAB)  CBC    EKG None  Radiology No results found.  Procedures Procedures (including critical care time)  Medications Ordered in ED Medications  nicotine  (NICODERM CQ - dosed in mg/24 hours) patch 21 mg (21 mg Transdermal Patch Applied 03/27/19 1832)  LORazepam (ATIVAN) injection 0-4 mg ( Intravenous See Alternative 03/27/19 1837)    Or  LORazepam (ATIVAN) tablet 0-4 mg (2 mg Oral Given 03/27/19 1837)  LORazepam (ATIVAN) injection 0-4 mg (has no administration in time range)    Or  LORazepam (ATIVAN) tablet 0-4 mg (has no administration in time range)  thiamine (VITAMIN B-1) tablet 100 mg (100 mg Oral Given 03/27/19 1838)    Or  thiamine (B-1) injection 100 mg ( Intravenous See Alternative 03/27/19 1838)     Initial Impression / Assessment and Plan / ED Course  I have reviewed the triage vital signs and the nursing notes.  Pertinent labs & imaging results that were available during my care of the patient were reviewed by me and considered in my medical decision making (see chart for details).        Patient with alcohol abuse.  Marijuana abuse.  Also suicidal thoughts.  This time patient is medically cleared.  Has been seen at behavioral health and inpatient treatment has been recommended.   Date: 03/27/2019  Rate: 73  Rhythm: normal sinus rhythm  QRS Axis: normal  Intervals: normal  ST/T Wave abnormalities: normal  Conduction Disutrbances: none  Narrative Interpretation: unremarkable     Final Clinical Impressions(s) / ED Diagnoses   Final diagnoses:  Suicidal ideation  Polysubstance abuse [facility])    ED Discharge Orders    None       Benjiman CorePickering, Mayda Shippee, MD 03/27/19 1807    Benjiman CorePickering, Leeann Bady, MD 03/27/19 2203

## 2019-03-27 NOTE — ED Triage Notes (Signed)
Pt here tearful with complaints of suicidal and alcohol abuse.  He states that he does not want to live anymore.  He is tearful and unkempt.

## 2019-03-27 NOTE — Progress Notes (Signed)
Received Brett Sanford in his bed asleep with the sitter at the bedside. EKG completed without incident.  Coronavirus test- negative. He continued to sleep throughout the night.

## 2019-03-28 ENCOUNTER — Encounter (HOSPITAL_COMMUNITY): Payer: Self-pay

## 2019-03-28 ENCOUNTER — Other Ambulatory Visit: Payer: Self-pay

## 2019-03-28 ENCOUNTER — Inpatient Hospital Stay (HOSPITAL_COMMUNITY)
Admission: RE | Admit: 2019-03-28 | Discharge: 2019-03-31 | DRG: 881 | Disposition: A | Discharge status: Home / Self Care | Payer: BC Managed Care – PPO | Source: Intra-hospital | Attending: Psychiatry | Admitting: Psychiatry

## 2019-03-28 DIAGNOSIS — F1994 Other psychoactive substance use, unspecified with psychoactive substance-induced mood disorder: Secondary | ICD-10-CM | POA: Diagnosis present

## 2019-03-28 DIAGNOSIS — F17210 Nicotine dependence, cigarettes, uncomplicated: Secondary | ICD-10-CM | POA: Diagnosis present

## 2019-03-28 DIAGNOSIS — G4700 Insomnia, unspecified: Secondary | ICD-10-CM | POA: Diagnosis present

## 2019-03-28 DIAGNOSIS — F988 Other specified behavioral and emotional disorders with onset usually occurring in childhood and adolescence: Secondary | ICD-10-CM | POA: Diagnosis present

## 2019-03-28 DIAGNOSIS — Z20828 Contact with and (suspected) exposure to other viral communicable diseases: Secondary | ICD-10-CM | POA: Diagnosis present

## 2019-03-28 DIAGNOSIS — F322 Major depressive disorder, single episode, severe without psychotic features: Secondary | ICD-10-CM | POA: Diagnosis present

## 2019-03-28 DIAGNOSIS — F329 Major depressive disorder, single episode, unspecified: Principal | ICD-10-CM | POA: Diagnosis present

## 2019-03-28 DIAGNOSIS — Z818 Family history of other mental and behavioral disorders: Secondary | ICD-10-CM

## 2019-03-28 DIAGNOSIS — Z888 Allergy status to other drugs, medicaments and biological substances status: Secondary | ICD-10-CM

## 2019-03-28 DIAGNOSIS — F10239 Alcohol dependence with withdrawal, unspecified: Secondary | ICD-10-CM | POA: Diagnosis present

## 2019-03-28 HISTORY — DX: Other specified health status: Z78.9

## 2019-03-28 LAB — RAPID URINE DRUG SCREEN, HOSP PERFORMED
Amphetamines: NOT DETECTED
Barbiturates: NOT DETECTED
Benzodiazepines: POSITIVE — AB
Cocaine: NOT DETECTED
Opiates: NOT DETECTED
Tetrahydrocannabinol: POSITIVE — AB

## 2019-03-28 LAB — COMPREHENSIVE METABOLIC PANEL
ALT: 27 U/L (ref 0–44)
AST: 32 U/L (ref 15–41)
Albumin: 4.5 g/dL (ref 3.5–5.0)
Alkaline Phosphatase: 61 U/L (ref 38–126)
Anion gap: 13 (ref 5–15)
BUN: 15 mg/dL (ref 6–20)
CO2: 27 mmol/L (ref 22–32)
Calcium: 9.9 mg/dL (ref 8.9–10.3)
Chloride: 101 mmol/L (ref 98–111)
Creatinine, Ser: 0.94 mg/dL (ref 0.61–1.24)
GFR calc Af Amer: >60 mL/min (ref >60)
GFR calc non Af Amer: >60 mL/min (ref >60)
Glucose, Bld: 114 mg/dL — ABNORMAL HIGH (ref 70–99)
Potassium: 4.3 mmol/L (ref 3.5–5.1)
Sodium: 141 mmol/L (ref 135–145)
Total Bilirubin: 1.7 mg/dL — ABNORMAL HIGH (ref 0.3–1.2)
Total Protein: 7.5 g/dL (ref 6.5–8.1)

## 2019-03-28 LAB — LIPID PANEL
Cholesterol: 250 mg/dL — ABNORMAL HIGH (ref 0–200)
HDL: >135 mg/dL (ref >40)
Triglycerides: 64 mg/dL (ref <150)
VLDL: 13 mg/dL (ref 0–40)

## 2019-03-28 LAB — SARS CORONAVIRUS 2 BY RT PCR (HOSPITAL ORDER, PERFORMED IN ~~LOC~~ HOSPITAL LAB): SARS Coronavirus 2: NEGATIVE

## 2019-03-28 LAB — HEMOGLOBIN A1C
Hgb A1c MFr Bld: 4.6 % — ABNORMAL LOW (ref 4.8–5.6)
Mean Plasma Glucose: 85.32 mg/dL

## 2019-03-28 LAB — TSH: TSH: 2.619 u[IU]/mL (ref 0.350–4.500)

## 2019-03-28 LAB — ETHANOL: Alcohol, Ethyl (B): <10 mg/dL (ref <10)

## 2019-03-28 MED ORDER — VITAMIN B-1 100 MG PO TABS
100.0000 mg | ORAL_TABLET | Freq: Every day | ORAL | Status: DC
  Filled 2019-03-28: qty 1

## 2019-03-28 MED ORDER — HYDROXYZINE HCL 25 MG PO TABS
25.0000 mg | ORAL_TABLET | Freq: Four times a day (QID) | ORAL | Status: DC | PRN
  Administered 2019-03-28 – 2019-03-30 (×3): 25 mg via ORAL
  Filled 2019-03-28 (×3): qty 1

## 2019-03-28 MED ORDER — LORAZEPAM 1 MG PO TABS
1.0000 mg | ORAL_TABLET | Freq: Two times a day (BID) | ORAL | Status: DC
  Administered 2019-03-30: 1 mg via ORAL
  Filled 2019-03-28: qty 1

## 2019-03-28 MED ORDER — LOPERAMIDE HCL 2 MG PO CAPS: 2.0000 mg | ORAL_CAPSULE | ORAL | Status: DC | PRN

## 2019-03-28 MED ORDER — HYDROXYZINE HCL 25 MG PO TABS: 25.0000 mg | ORAL_TABLET | Freq: Four times a day (QID) | ORAL | Status: DC | PRN

## 2019-03-28 MED ORDER — VITAMIN B-1 100 MG PO TABS
100.0000 mg | ORAL_TABLET | Freq: Every day | ORAL | Status: DC
  Administered 2019-03-29 – 2019-03-31 (×3): 100 mg via ORAL
  Filled 2019-03-28 (×5): qty 1

## 2019-03-28 MED ORDER — ONDANSETRON 4 MG PO TBDP: 4.0000 mg | ORAL_TABLET | Freq: Four times a day (QID) | ORAL | Status: DC | PRN

## 2019-03-28 MED ORDER — ACETAMINOPHEN 325 MG PO TABS
650.0000 mg | ORAL_TABLET | Freq: Four times a day (QID) | ORAL | Status: DC | PRN
  Administered 2019-03-28 – 2019-03-31 (×5): 650 mg via ORAL
  Filled 2019-03-28 (×4): qty 2

## 2019-03-28 MED ORDER — ALUM & MAG HYDROXIDE-SIMETH 200-200-20 MG/5ML PO SUSP: 30.0000 mL | ORAL | Status: DC | PRN

## 2019-03-28 MED ORDER — LORAZEPAM 1 MG PO TABS: 1.0000 mg | ORAL_TABLET | Freq: Four times a day (QID) | ORAL | Status: DC | PRN

## 2019-03-28 MED ORDER — ADULT MULTIVITAMIN W/MINERALS CH
1.0000 | ORAL_TABLET | Freq: Every day | ORAL | Status: DC
  Filled 2019-03-28 (×2): qty 1

## 2019-03-28 MED ORDER — FOLIC ACID 1 MG PO TABS
1.0000 mg | ORAL_TABLET | Freq: Every day | ORAL | Status: DC
  Administered 2019-03-28 – 2019-03-31 (×4): 1 mg via ORAL
  Filled 2019-03-28 (×6): qty 1

## 2019-03-28 MED ORDER — THIAMINE HCL 100 MG/ML IJ SOLN: 100.0000 mg | Freq: Once | INTRAMUSCULAR | Status: DC

## 2019-03-28 MED ORDER — LORAZEPAM 1 MG PO TABS
1.0000 mg | ORAL_TABLET | Freq: Every day | ORAL | Status: AC
  Administered 2019-03-31: 1 mg via ORAL
  Filled 2019-03-28: qty 1

## 2019-03-28 MED ORDER — ADULT MULTIVITAMIN W/MINERALS CH
1.0000 | ORAL_TABLET | Freq: Every day | ORAL | Status: DC
  Administered 2019-03-28 – 2019-03-31 (×4): 1 via ORAL
  Filled 2019-03-28 (×6): qty 1

## 2019-03-28 MED ORDER — LORAZEPAM 1 MG PO TABS
1.0000 mg | ORAL_TABLET | Freq: Three times a day (TID) | ORAL | Status: AC
  Administered 2019-03-29 – 2019-03-30 (×3): 1 mg via ORAL
  Filled 2019-03-28 (×3): qty 1

## 2019-03-28 MED ORDER — LORAZEPAM 1 MG PO TABS
1.0000 mg | ORAL_TABLET | Freq: Four times a day (QID) | ORAL | Status: AC
  Administered 2019-03-28 – 2019-03-29 (×3): 1 mg via ORAL
  Filled 2019-03-28 (×3): qty 1

## 2019-03-28 NOTE — Progress Notes (Signed)
D: Pt was in hallway upon initial approach.  Pt presents with anxious, depressed affect and mood.  He states he is "making it."  His goal is to "be able to cope with issues on my own and to get the links to the right help."  Pt denies SI/HI, denies hallucinations, reports pain from headache of 6/10.  Pt has been visible in milieu interacting with peers and staff appropriately.   A: Introduced self to pt.  Met with pt 1:1.  Actively listened to pt and offered support and encouragement.  Medications administered per order.  PRN medication administered for anxiety and pain.  15 minute safety checks maintained.  R: Pt is safe on the unit.  Pt is compliant with medications.  Pt verbally contracts for safety.  Will continue to monitor and assess.

## 2019-03-28 NOTE — H&P (Signed)
Psychiatric Admission Assessment Adult  Patient Identification: Brett Sanford MRN:  [id number] Date of Evaluation:  03/28/2019 Chief Complaint:  mdd alcohol use disorder  Principal Diagnosis: <principal problem not specified> Diagnosis:  Active Problems:   MDD (major depressive disorder)  History of Present Illness: Patient is seen and examined.  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  The patient stated that he is been abusing alcohol for several years, but recently had increased significantly.  He stated he was drinking approximately 1/5 of vodka daily.  He was also using marijuana.  He stated that his last drink was a little over 24 hours ago.  He stated that his longest period of sobriety over the last 6 months was 5 days, and had moderate withdrawal symptoms including tremor.  He started drinking alcohol in high school, and then afterwards as he got older used more substances including excessive alcohol.  He admitted to previously using LSD and "K2".  He also smokes marijuana on a daily basis.  He last was in substance rehabilitation 7 years ago.  He did 2 outpatient programs as well as one inpatient treatment at [facility].  He stated that after this admission he did like to do an outpatient program.  He admitted in the past when he used LSD he suffered a seizure, but denied any previous seizures related to alcohol withdrawal.  He admitted to significant anxiety with or without alcohol.  He denied any formal treatment with that.  He stated he has used benzodiazepines infrequently as an outpatient, but does not like the way that they make him feel.  He denied any court issues, no DUIs, and is employed at [employer].  He stated that his work stress is significant there.   He denied any previous suicide attempts.  But stated he felt as though his depression and anxiety was getting to the point that he felt as though he might hurt himself and then came here.  He was admitted to the hospital for evaluation and stabilization.  Associated Signs/Symptoms: Depression Symptoms:  depressed mood, anhedonia, insomnia, psychomotor agitation, fatigue, feelings of worthlessness/guilt, difficulty concentrating, hopelessness, suicidal thoughts without plan, anxiety, panic attacks, loss of energy/fatigue, disturbed sleep, (Hypo) Manic Symptoms:  Impulsivity, Irritable Mood, Labiality of Mood, Anxiety Symptoms:  Excessive Worry, Panic Symptoms, Psychotic Symptoms:  denied PTSD Symptoms: Negative Total Time spent with patient: 30 minutes  Past Psychiatric History: Patient admitted to 3 previous treatments for substance abuse.  2 of these were outpatient at [facility], and one residential program at [facility] approximately 7 years ago.  He also has been previously diagnosed with attention deficit disorder.  He is not currently on any treatment, but is seeking treatment with stimulants.  He has abused multiple substances in the past including alcohol, LSD and "K2".  He also is a daily marijuana user.  Is the patient at risk to self? Yes.    Has the patient been a risk to self in the past 6 months? No.  Has the patient been a risk to self within the distant past? No.  Is the patient a risk to others? No.  Has the patient been a risk to others in the past 6 months? No.  Has the patient been a risk to others within the distant past? No.   Prior Inpatient Therapy:  Prior Outpatient Therapy:    Alcohol Screening:   Substance Abuse History in the last 12 months:  Yes.   Consequences of Substance Abuse: Medical Consequences:  : Previous detoxes and a residential treatment.  Also suffered a seizure when abusing LSD. Previous Psychotropic Medications: No  Psychological  Evaluations: No  Past Medical History:  Past Medical History:  Diagnosis Date  . ADD (attention deficit disorder)   . Drug abuse ([facility])    No past surgical history on file. Family History: No family history on file. Family Psychiatric  History: Patient stated he had a family history of depression, substance issues and someone with borderline personality disorder. Tobacco Screening:   Social History:  Social History   Substance and Sexual Activity  Alcohol Use Yes     Social History   Substance and Sexual Activity  Drug Use Yes  . Types: Marijuana    Additional Social History:                           Allergies:   Allergies  Allergen Reactions  . Benadryl [Diphenhydramine Hcl]    Lab Results:  Results for orders placed or performed during the hospital encounter of 03/27/19 (from the past 48 hour(s))  Comprehensive metabolic panel     Status: Abnormal   Collection Time: 03/27/19  5:03 PM  Result Value Ref Range   Sodium 143 135 - 145 mmol/L   Potassium 4.1 3.5 - 5.1 mmol/L   Chloride 106 98 - 111 mmol/L   CO2 25 22 - 32 mmol/L   Glucose, Bld 98 70 - 99 mg/dL   BUN 10 6 - 20 mg/dL   Creatinine, Ser 3.080.89 0.61 - 1.24 mg/dL   Calcium 9.3 8.9 - 65.710.3 mg/dL   Total Protein 7.5 6.5 - 8.1 g/dL   Albumin 4.6 3.5 - 5.0 g/dL   AST 32 15 - 41 U/L   ALT 24 0 - 44 U/L   Alkaline Phosphatase 50 38 - 126 U/L   Total Bilirubin 1.6 (H) 0.3 - 1.2 mg/dL   GFR calc non Af Amer >60 >60 mL/min   GFR calc Af Amer >60 >60 mL/min   Anion gap 12 5 - 15    Comment: Performed at [facility], [address] [place], [place] [address]  Ethanol     Status: Abnormal   Collection Time: 03/27/19  5:03 PM  Result Value Ref Range   Alcohol, Ethyl (B) 97 (H) <10 mg/dL    Comment: (NOTE) Lowest detectable limit for serum alcohol is 10 mg/dL. For medical purposes only. Performed at [facility], [address] [place], [place] [address]   CBC      Status: None   Collection Time: 03/27/19  5:03 PM  Result Value Ref Range   WBC 5.7 4.0 - 10.5 K/uL   RBC 4.59 4.22 - 5.81 MIL/uL   Hemoglobin 14.3 13.0 - 17.0 g/dL   HCT 41.343.9 24.439.0 - 01.052.0 %   MCV 95.6 80.0 - 100.0 fL   MCH 31.2 26.0 - 34.0 pg   MCHC 32.6 30.0 - 36.0 g/dL   RDW 27.212.0 53.611.5 - 64.415.5 %   Platelets 229 150 - 400 K/uL   nRBC 0.0 0.0 - 0.2 %    Comment: Performed at [facility], [address] [place], [place] [address]  Urine rapid drug screen (hosp performed)     Status: Abnormal  Collection Time: 03/27/19  5:05 PM  Result Value Ref Range   Opiates NONE DETECTED NONE DETECTED   Cocaine NONE DETECTED NONE DETECTED   Benzodiazepines NONE DETECTED NONE DETECTED   Amphetamines NONE DETECTED NONE DETECTED   Tetrahydrocannabinol POSITIVE (A) NONE DETECTED   Barbiturates NONE DETECTED NONE DETECTED    Comment: (NOTE) DRUG SCREEN FOR MEDICAL PURPOSES ONLY.  IF CONFIRMATION IS NEEDED FOR ANY PURPOSE, NOTIFY LAB WITHIN 5 DAYS. LOWEST DETECTABLE LIMITS FOR URINE DRUG SCREEN Drug Class                     Cutoff (ng/mL) Amphetamine and metabolites    1000 Barbiturate and metabolites    200 Benzodiazepine                 200 Tricyclics and metabolites     300 Opiates and metabolites        300 Cocaine and metabolites        300 THC                            50 Performed at [facility], [address] [place], [place] [address]   SARS Coronavirus 2 (CEPHEID - Performed in [facility] hospital lab), Hosp Order     Status: None   Collection Time: 03/27/19  9:45 PM   Specimen: Nasopharyngeal Swab  Result Value Ref Range   SARS Coronavirus 2 NEGATIVE NEGATIVE    Comment: (NOTE) If result is NEGATIVE SARS-CoV-2 target nucleic acids are NOT DETECTED. The SARS-CoV-2 RNA is generally detectable in upper and lower  respiratory specimens during the acute phase of infection. The lowest  concentration of SARS-CoV-2 viral copies this assay  can detect is 250  copies / mL. A negative result does not preclude SARS-CoV-2 infection  and should not be used as the sole basis for treatment or other  patient management decisions.  A negative result may occur with  improper specimen collection / handling, submission of specimen other  than nasopharyngeal swab, presence of viral mutation(s) within the  areas targeted by this assay, and inadequate number of viral copies  (<250 copies / mL). A negative result must be combined with clinical  observations, patient history, and epidemiological information. If result is POSITIVE SARS-CoV-2 target nucleic acids are DETECTED. The SARS-CoV-2 RNA is generally detectable in upper and lower  respiratory specimens dur ing the acute phase of infection.  Positive  results are indicative of active infection with SARS-CoV-2.  Clinical  correlation with patient history and other diagnostic information is  necessary to determine patient infection status.  Positive results do  not rule out bacterial infection or co-infection with other viruses. If result is PRESUMPTIVE POSTIVE SARS-CoV-2 nucleic acids MAY BE PRESENT.   A presumptive positive result was obtained on the submitted specimen  and confirmed on repeat testing.  While 2019 novel coronavirus  (SARS-CoV-2) nucleic acids may be present in the submitted sample  additional confirmatory testing may be necessary for epidemiological  and / or clinical management purposes  to differentiate between  SARS-CoV-2 and other Sarbecovirus currently known to infect humans.  If clinically indicated additional testing with an alternate test  methodology [phone]) is advised. The SARS-CoV-2 RNA is generally  detectable in upper and lower respiratory sp ecimens during the acute  phase of infection. The expected result is Negative. Fact Sheet for Patients:  BoilerBrush.com.cy Fact Sheet for Healthcare  Providers:  BankingDealers.co.za This test is not yet approved or cleared by the [place] and has been authorized for detection and/or diagnosis of SARS-CoV-2 by FDA under an Emergency Use Authorization (EUA).  This EUA will remain in effect (meaning this test can be used) for the duration of the COVID-19 declaration under Section 564(b)(1) of the Act, 21 U.S.C. section 360bbb-3(b)(1), unless the authorization is terminated or revoked sooner. Performed at [facility], [place] [address] [place],  [address]     Blood Alcohol level:  Lab Results  Component Value Date   ETH 97 (H) 03/27/2019   ETH <11 64/40/3474    Metabolic Disorder Labs:  No results found for: HGBA1C, MPG No results found for: PROLACTIN No results found for: CHOL, TRIG, HDL, CHOLHDL, VLDL, LDLCALC  Current Medications: Current Facility-Administered Medications  Medication Dose Route Frequency Provider Last Rate Last Dose  . acetaminophen (TYLENOL) tablet 650 mg  650 mg Oral Q6H PRN Sharma Covert, MD      . alum & mag hydroxide-simeth (MAALOX/MYLANTA) 200-200-20 MG/5ML suspension 30 mL  30 mL Oral Q4H PRN Sharma Covert, MD      . folic acid (FOLVITE) tablet 1 mg  1 mg Oral Daily Sharma Covert, MD      . hydrOXYzine (ATARAX/VISTARIL) tablet 25 mg  25 mg Oral Q6H PRN Sharma Covert, MD      . loperamide (IMODIUM) capsule 2-4 mg  2-4 mg Oral PRN Sharma Covert, MD      . LORazepam (ATIVAN) tablet 1 mg  1 mg Oral Q6H PRN Sharma Covert, MD      . LORazepam (ATIVAN) tablet 1 mg  1 mg Oral QID Sharma Covert, MD       Followed by  . [START ON 03/29/2019] LORazepam (ATIVAN) tablet 1 mg  1 mg Oral TID Sharma Covert, MD       Followed by  . [START ON 03/30/2019] LORazepam (ATIVAN) tablet 1 mg  1 mg Oral BID Sharma Covert, MD       Followed by  . [START ON 04/01/2019] LORazepam (ATIVAN) tablet 1 mg  1 mg Oral Daily Apryle Stowell, Cordie Grice, MD      . multivitamin with minerals tablet 1 tablet  1 tablet Oral Daily Sharma Covert, MD      . ondansetron (ZOFRAN-ODT) disintegrating tablet 4 mg  4 mg Oral Q6H PRN Sharma Covert, MD      . thiamine (B-1) injection 100 mg  100 mg Intramuscular Once Sharma Covert, MD      . Derrill Memo ON 03/29/2019] thiamine (VITAMIN B-1) tablet 100 mg  100 mg Oral Daily Sharma Covert, MD       PTA Medications: Medications Prior to Admission  Medication Sig Dispense Refill Last Dose  . ibuprofen (ADVIL,MOTRIN) 600 MG tablet Take 1 tablet (600 mg total) by mouth every 6 (six) hours as needed. (Patient not taking: Reported on 03/27/2019) 30 tablet 0   . neomycin-bacitracin-polymyxin (NEOSPORIN) ointment Apply 1 application topically every 12 (twelve) hours. (Patient not taking: Reported on 03/27/2019) 15 g 0     Musculoskeletal: Strength & Muscle Tone: within normal limits Gait & Station: normal Patient leans: N/A  Psychiatric Specialty Exam: Physical Exam  Nursing note and vitals reviewed. Constitutional: He is oriented to person, place, and time. He appears well-developed and well-nourished.  HENT:  Head: Normocephalic and atraumatic.  Respiratory: Effort normal.  Neurological: He is alert  and oriented to person, place, and time.    ROS  There were no vitals taken for this visit.There is no height or weight on file to calculate BMI.  General Appearance: Disheveled  Eye Contact:  Fair  Speech:  Pressured  Volume:  Increased  Mood:  Anxious, Depressed and Dysphoric  Affect:  Congruent  Thought Process:  Coherent and Descriptions of Associations: Intact  Orientation:  Full (Time, Place, and Person)  Thought Content:  Logical  Suicidal Thoughts:  Yes.  without intent/plan  Homicidal Thoughts:  No  Memory:  Immediate;   Fair Recent;   Fair Remote;   Fair  Judgement:  Intact  Insight:  Fair  Psychomotor Activity:  Increased  Concentration:  Concentration: Fair and  Attention Span: Fair  Recall:  [employer]Fair  Fund of Knowledge:  Fair  Language:  Good  Akathisia:  Negative  Handed:  Right  AIMS (if indicated):     Assets:  Desire for Improvement Resilience  ADL's:  Intact  Cognition:  WNL  Sleep:       Treatment Plan Summary: Daily contact with patient to assess and evaluate symptoms and progress in treatment, Medication management and Plan : Patient is seen and examined.  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  He will be admitted to the hospital.  He will be integrated into the milieu.  He will be encouraged to attend groups.  He has already stated that he would like to do an outpatient substance abuse treatment program after discharge, and we will begin processing that.  Given the degree of his significant agitation at this point I am concerned about the possibility of severe withdrawal.  I am going to place him on a standing lorazepam taper at this point.  He will also get a multivitamin, thiamine and folic acid.  Even though he had a seizure when he had abused LSD in the past, I am still going to place him on seizure precautions to be safe.  Otherwise we will get his laboratories this afternoon with regard to his liver function enzymes and other metabolic abnormalities that might be present.  I am going to wait on any additional medications at this point until we get his labs back, and see what might be in his system as well.  Observation Level/Precautions:  Detox 15 minute checks Seizure  Laboratory:  CBC Chemistry Profile  Psychotherapy:    Medications:    Consultations:    Discharge Concerns:    Estimated LOS:  Other:     Physician Treatment Plan for Primary Diagnosis: <principal problem not specified> Long Term Goal(s):  Improvement in symptoms so as ready for discharge  Short Term Goals: Ability to identify changes in lifestyle to reduce recurrence of condition will improve, Ability to verbalize feelings will improve, Ability to disclose and discuss suicidal ideas, Ability to demonstrate self-control will improve, Ability to identify and develop effective coping behaviors will improve, Ability to maintain clinical measurements within normal limits will improve and Ability to identify triggers associated with substance abuse/mental health issues will improve  Physician Treatment Plan for Secondary Diagnosis: Active Problems:   MDD (major depressive disorder)  Long Term Goal(s): Improvement in symptoms so as ready for discharge  Short Term Goals: Ability to identify changes in lifestyle to reduce recurrence of condition will improve, Ability to verbalize  feelings will improve, Ability to disclose and discuss suicidal ideas, Ability to demonstrate self-control will improve, Ability to identify and develop effective coping behaviors will improve, Ability to maintain clinical measurements within normal limits will improve and Ability to identify triggers associated with substance abuse/mental health issues will improve  I certify that inpatient services furnished can reasonably be expected to improve the patient's condition.    Antonieta PertGreg Lawson Marques Ericson, MD 6/18/20201:33 PM

## 2019-03-28 NOTE — BH Assessment (Signed)
[facility] Assessment Progress Note  Per L. Marcello Moores, NP, this pt requires psychiatric hospitalization at this time.  Per EPIC record, pt has been assigned pt to [facility].  Pt has signed Voluntary Admission and Consent for Treatment, as well as Consent to Release Information to no one, and signed forms have been faxed to [facility].  Pt's nurse, Eustaquio Maize, has been notified, and agrees to send original paperwork along with pt via [facility], and to call report to [phone].  Jalene Mullet, [place] Coordinator [phone]

## 2019-03-28 NOTE — Tx Team (Signed)
Initial Treatment Plan 03/28/2019 2:58 PM Brett Sanford [id number]:[id number]    PATIENT STRESSORS: Substance abuse   PATIENT STRENGTHS: Ability for insight Average or above average intelligence   PATIENT IDENTIFIED PROBLEMS: "substance abuse"  anxiety                   DISCHARGE CRITERIA:  Ability to meet basic life and health needs Improved stabilization in mood, thinking, and/or behavior  PRELIMINARY DISCHARGE PLAN: Outpatient therapy  PATIENT/FAMILY INVOLVEMENT: This treatment plan has been presented to and reviewed with the patient, Brett Sanford.  The patient and family have been given the opportunity to ask questions and make suggestions.  Megan Mans, RN 03/28/2019, 2:58 PM

## 2019-03-28 NOTE — Progress Notes (Signed)
Adult Psychoeducational Group Note  Date:  03/28/2019 Time:  9:12 PM  Group Topic/Focus:  Wrap-Up Group:   The focus of this group is to help patients review their daily goal of treatment and discuss progress on daily workbooks.  Participation Level:  Active  Participation Quality:  Appropriate  Affect:  Appropriate  Cognitive:  Alert  Insight: Appropriate  Engagement in Group:  Engaged  Modes of Intervention:  Discussion  Additional Comments:  Pt rated day 4/10. His goal was to get settled into the routine on the unit.   Wynelle Fanny R 03/28/2019, 9:12 PM

## 2019-03-28 NOTE — ED Provider Notes (Signed)
Pt accepted to [facility]. Will transfer stable.    Francine Graven, DO 03/28/19 1120

## 2019-03-28 NOTE — ED Notes (Signed)
Pt off unit to [facility] per provider. Pt alert, calm, cooperative. Pt DC information given to [facility]ham for [facility]. Pt ambulatory off unit, escorted by tech. Pt transported by pelham

## 2019-03-28 NOTE — BHH Suicide Risk Assessment (Signed)
[facility]   Nursing information obtained from:    Demographic factors:    Current Mental Status:    Loss Factors:    Historical Factors:    Risk Reduction Factors:     Total Time spent with patient: 30 minutes Principal Problem: <principal problem not specified> Diagnosis:  Active Problems:   MDD (major depressive disorder)  Subjective Data: Patient is seen and examined.  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  The patient stated that he is been abusing alcohol for several years, but recently had increased significantly.  He stated he was drinking approximately 1/5 of vodka daily.  He was also using marijuana.  He stated that his last drink was a little over 24 hours ago.  He stated that his longest period of sobriety over the last 6 months was 5 days, and had moderate withdrawal symptoms including tremor.  He started drinking alcohol in high school, and then afterwards as he got older used more substances including excessive alcohol.  He admitted to previously using LSD and "K2".  He also smokes marijuana on a daily basis.  He last was in substance rehabilitation 7 years ago.  He did 2 outpatient programs as well as [facility]  He stated that after this admission he did like to do an outpatient program.  He admitted in the past when he used LSD he suffered a seizure, but denied any previous seizures related to alcohol withdrawal.  He admitted to significant anxiety with or without alcohol.  He denied any formal treatment with that.  He stated he has used benzodiazepines infrequently as an outpatient, but does not like the way that they make him feel.  He denied any court issues, no DUIs, and is employed at  [employer].  He stated that his work stress is significant there.  He denied any previous suicide attempts.  But stated he felt as though his depression and anxiety was getting to the point that he felt as though he might hurt himself and then came here.  He was admitted to the hospital for evaluation and stabilization.  Continued Clinical Symptoms:    The "Alcohol Use Disorders Identification Test", Guidelines for Use in Primary Care, Second Edition.  World Science writerHealth Organization [facility](WHO). Score between 0-7:  no or low risk or alcohol related problems. Score between 8-15:  moderate risk of alcohol related problems. Score between 16-19:  high risk of alcohol related problems. Score 20 or above:  warrants further diagnostic evaluation for alcohol dependence and treatment.   CLINICAL FACTORS:   Severe Anxiety and/or Agitation Panic Attacks Depression:   Anhedonia Hopelessness Impulsivity Insomnia Alcohol/Substance Abuse/Dependencies   Musculoskeletal: Strength & Muscle Tone: within normal limits Gait & Station: normal Patient leans: N/A  Psychiatric Specialty Exam: Physical Exam  Nursing note and vitals reviewed. Constitutional: He is oriented to person, place, and time. He appears well-developed.  HENT:  Head: Normocephalic and atraumatic.  Respiratory: Effort normal.  Neurological: He is alert and oriented to person, place, and time.    ROS  There were no vitals taken for this visit.There is no height or weight on file to calculate BMI.  General Appearance: Disheveled  Eye Contact:  Fair  Speech:  Normal Rate  Volume:  Increased  Mood:  Anxious  and Dysphoric  Affect:  Congruent  Thought Process:  Coherent and Descriptions of Associations: Intact  Orientation:  Full (Time, Place, and Person)  Thought Content:  Logical  Suicidal Thoughts:  Yes.  without intent/plan  Homicidal Thoughts:  No  Memory:  Immediate;   Fair Recent;   Fair Remote;   Fair  Judgement:  Intact  Insight:   Fair  Psychomotor Activity:  Increased  Concentration:  Concentration: Fair and Attention Span: Fair  Recall:  [employer] of Knowledge:  Fair  Language:  Fair  Akathisia:  Negative  Handed:  Right  AIMS (if indicated):     Assets:  Desire for Improvement Resilience  ADL's:  Intact  Cognition:  WNL  Sleep:         COGNITIVE FEATURES THAT CONTRIBUTE TO RISK:  None    SUICIDE RISK:   Mild:  Suicidal ideation of limited frequency, intensity, duration, and specificity.  There are no identifiable plans, no associated intent, mild dysphoria and related symptoms, good self-control (both objective and subjective assessment), few other risk factors, and identifiable protective factors, including available and accessible social support.  PLAN OF CARE: Patient is seen and examined.  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  He will be admitted to the hospital.  He will be integrated into the milieu.  He will be encouraged to attend groups.  He has already stated that he would like to do an outpatient substance abuse treatment program after discharge, and we will begin processing that.  Given the degree of his significant agitation at this point I am concerned about the possibility of severe withdrawal.  I am going to place him on a standing lorazepam taper at this point.  He will also get a multivitamin, thiamine and folic acid.  Even though he had a seizure when he had abused LSD in the past, I am still going to place him on seizure precautions to be safe.  Otherwise we will get his laboratories this afternoon with regard to his liver function enzymes and other metabolic abnormalities that might be present.  I am going to wait on any additional medications at this point until we  get his labs back, and see what might be in his system as well.  I certify that inpatient services furnished can reasonably be expected to improve the patient's condition.   Sharma Covert, MD 03/28/2019, 1:23 PM

## 2019-03-29 LAB — CBC
HCT: 45 % (ref 39.0–52.0)
Hemoglobin: 15.1 g/dL (ref 13.0–17.0)
MCH: 32.5 pg (ref 26.0–34.0)
MCHC: 33.6 g/dL (ref 30.0–36.0)
MCV: 97 fL (ref 80.0–100.0)
Platelets: 229 10*3/uL (ref 150–400)
RBC: 4.64 MIL/uL (ref 4.22–5.81)
RDW: 12 % (ref 11.5–15.5)
WBC: 6.7 10*3/uL (ref 4.0–10.5)
nRBC: 0 % (ref 0.0–0.2)

## 2019-03-29 MED ORDER — NICOTINE 21 MG/24HR TD PT24
MEDICATED_PATCH | TRANSDERMAL | Status: AC
  Administered 2019-03-29: 09:00:00
  Filled 2019-03-29: qty 1

## 2019-03-29 NOTE — BHH Suicide Risk Assessment (Signed)
[place] INPATIENT:  Family/Significant Other Suicide Prevention Education  Suicide Prevention Education:  Patient Refusal for Family/Significant Other Suicide Prevention Education: The patient Brett Sanford has refused to provide written consent for family/significant other to be provided Family/Significant Other Suicide Prevention Education during admission and/or prior to discharge.  Physician notified.   Patient reports his supports and family are knowledgeable of suicide education and risks. Politely declines consents.  Joellen Jersey 03/29/2019, 1:13 PM

## 2019-03-29 NOTE — Tx Team (Signed)
Interdisciplinary Treatment and Diagnostic Plan Update  03/29/2019 Time of Session: 9:00am Brett GaribaldiSeth P Sanford MRN: [id number]  Principal Diagnosis: <principal problem not specified>  Secondary Diagnoses: Active Problems:   MDD (major depressive disorder)   Current Medications:  Current Facility-Administered Medications  Medication Dose Route Frequency Provider Last Rate Last Dose  . acetaminophen (TYLENOL) tablet 650 mg  650 mg Oral Q6H PRN Antonieta Pertlary, Greg Lawson, MD   650 mg at 03/28/19 1946  . alum & mag hydroxide-simeth (MAALOX/MYLANTA) 200-200-20 MG/5ML suspension 30 mL  30 mL Oral Q4H PRN Antonieta Pertlary, Greg Lawson, MD      . folic acid (FOLVITE) tablet 1 mg  1 mg Oral Daily Antonieta Pertlary, Greg Lawson, MD   1 mg at 03/29/19 [id number]  . hydrOXYzine (ATARAX/VISTARIL) tablet 25 mg  25 mg Oral Q6H PRN Antonieta Pertlary, Greg Lawson, MD   25 mg at 03/28/19 2107  . loperamide (IMODIUM) capsule 2-4 mg  2-4 mg Oral PRN Antonieta Pertlary, Greg Lawson, MD      . LORazepam (ATIVAN) tablet 1 mg  1 mg Oral Q6H PRN Antonieta Pertlary, Greg Lawson, MD      . LORazepam (ATIVAN) tablet 1 mg  1 mg Oral QID Antonieta Pertlary, Greg Lawson, MD   1 mg at 03/29/19 0829   Followed by  . LORazepam (ATIVAN) tablet 1 mg  1 mg Oral TID Antonieta Pertlary, Greg Lawson, MD   1 mg at 03/29/19 1047   Followed by  . [START ON 03/30/2019] LORazepam (ATIVAN) tablet 1 mg  1 mg Oral BID Antonieta Pertlary, Greg Lawson, MD       Followed by  . [START ON 04/01/2019] LORazepam (ATIVAN) tablet 1 mg  1 mg Oral Daily Antonieta Pertlary, Greg Lawson, MD      . multivitamin with minerals tablet 1 tablet  1 tablet Oral Daily Antonieta Pertlary, Greg Lawson, MD   1 tablet at 03/29/19 0829  . ondansetron (ZOFRAN-ODT) disintegrating tablet 4 mg  4 mg Oral Q6H PRN Antonieta Pertlary, Greg Lawson, MD      . thiamine (B-1) injection 100 mg  100 mg Intramuscular Once Antonieta Pertlary, Greg Lawson, MD      . thiamine (VITAMIN B-1) tablet 100 mg  100 mg Oral Daily Antonieta Pertlary, Greg Lawson, MD   100 mg at 03/29/19 [id number]   PTA Medications: Medications Prior to Admission  Medication Sig  Dispense Refill Last Dose  . ibuprofen (ADVIL,MOTRIN) 600 MG tablet Take 1 tablet (600 mg total) by mouth every 6 (six) hours as needed. (Patient not taking: Reported on 03/27/2019) 30 tablet 0   . neomycin-bacitracin-polymyxin (NEOSPORIN) ointment Apply 1 application topically every 12 (twelve) hours. (Patient not taking: Reported on 03/27/2019) 15 g 0     Patient Stressors: Substance abuse  Patient Strengths: Ability for insight Average or above average intelligence  Treatment Modalities: Medication Management, Group therapy, Case management,  1 to 1 session with clinician, Psychoeducation, Recreational therapy.   Physician Treatment Plan for Primary Diagnosis: <principal problem not specified> Long Term Goal(s): Improvement in symptoms so as ready for discharge Improvement in symptoms so as ready for discharge   Short Term Goals: Ability to identify changes in lifestyle to reduce recurrence of condition will improve Ability to verbalize feelings will improve Ability to disclose and discuss suicidal ideas Ability to demonstrate self-control will improve Ability to identify and develop effective coping behaviors will improve Ability to maintain clinical measurements within normal limits will improve Ability to identify triggers associated with substance abuse/mental health issues will improve Ability to identify changes in lifestyle to  reduce recurrence of condition will improve Ability to verbalize feelings will improve Ability to disclose and discuss suicidal ideas Ability to demonstrate self-control will improve Ability to identify and develop effective coping behaviors will improve Ability to maintain clinical measurements within normal limits will improve Ability to identify triggers associated with substance abuse/mental health issues will improve  Medication Management: Evaluate patient's response, side effects, and tolerance of medication regimen.  Therapeutic Interventions:  1 to 1 sessions, Unit Group sessions and Medication administration.  Evaluation of Outcomes: Progressing  Physician Treatment Plan for Secondary Diagnosis: Active Problems:   MDD (major depressive disorder)  Long Term Goal(s): Improvement in symptoms so as ready for discharge Improvement in symptoms so as ready for discharge   Short Term Goals: Ability to identify changes in lifestyle to reduce recurrence of condition will improve Ability to verbalize feelings will improve Ability to disclose and discuss suicidal ideas Ability to demonstrate self-control will improve Ability to identify and develop effective coping behaviors will improve Ability to maintain clinical measurements within normal limits will improve Ability to identify triggers associated with substance abuse/mental health issues will improve Ability to identify changes in lifestyle to reduce recurrence of condition will improve Ability to verbalize feelings will improve Ability to disclose and discuss suicidal ideas Ability to demonstrate self-control will improve Ability to identify and develop effective coping behaviors will improve Ability to maintain clinical measurements within normal limits will improve Ability to identify triggers associated with substance abuse/mental health issues will improve     Medication Management: Evaluate patient's response, side effects, and tolerance of medication regimen.  Therapeutic Interventions: 1 to 1 sessions, Unit Group sessions and Medication administration.  Evaluation of Outcomes: Progressing   RN Treatment Plan for Primary Diagnosis: <principal problem not specified> Long Term Goal(s): Knowledge of disease and therapeutic regimen to maintain health will improve  Short Term Goals: Ability to identify and develop effective coping behaviors will improve and Compliance with prescribed medications will improve  Medication Management: RN will administer medications as ordered  by provider, will assess and evaluate patient's response and provide education to patient for prescribed medication. RN will report any adverse and/or side effects to prescribing provider.  Therapeutic Interventions: 1 on 1 counseling sessions, Psychoeducation, Medication administration, Evaluate responses to treatment, Monitor vital signs and CBGs as ordered, Perform/monitor CIWA, COWS, AIMS and Fall Risk screenings as ordered, Perform wound care treatments as ordered.  Evaluation of Outcomes: Progressing   LCSW Treatment Plan for Primary Diagnosis: <principal problem not specified> Long Term Goal(s): Safe transition to appropriate next level of care at discharge, Engage patient in therapeutic group addressing interpersonal concerns.  Short Term Goals: Engage patient in aftercare planning with referrals and resources, Increase social support, Identify triggers associated with mental health/substance abuse issues and Increase skills for wellness and recovery  Therapeutic Interventions: Assess for all discharge needs, 1 to 1 time with Social worker, Explore available resources and support systems, Assess for adequacy in community support network, Educate family and significant other(s) on suicide prevention, Complete Psychosocial Assessment, Interpersonal group therapy.  Evaluation of Outcomes: Progressing   Progress in Treatment: Attending groups: Yes. Participating in groups: Yes. Taking medication as prescribed: Yes. Toleration medication: Yes. Family/Significant other contact made: No, will contact:  supports if consents are granted Patient understands diagnosis: Yes. Discussing patient identified problems/goals with staff: Yes. Medical problems stabilized or resolved: Yes. Denies suicidal/homicidal ideation: Yes. Issues/concerns per patient self-inventory: No.  New problem(s) identified: Yes, Describe:  CSW continuing to assess  New Short Term/Long Term Goal(s): detox, medication  management for mood stabilization; elimination of SI thoughts; development of comprehensive mental wellness/sobriety plan.  Patient Goals:    Discharge Plan or Barriers: CSW continuing to assess for appropriate referrals.   Reason for Continuation of Hospitalization: Anxiety Depression Withdrawal symptoms  Estimated Length of Stay: 3-5 days  Attendees: Patient: 03/29/2019 10:52 AM  Physician:  03/29/2019 10:52 AM  Nursing:  03/29/2019 10:52 AM  RN Care Manager: 03/29/2019 10:52 AM  Social Worker: Brett Sanford, LCSWA 03/29/2019 10:52 AM  Recreational Therapist:  03/29/2019 10:52 AM  Other:  03/29/2019 10:52 AM  Other:  03/29/2019 10:52 AM  Other: 03/29/2019 10:52 AM    Scribe for Treatment Team: Darreld Mcleanharlotte C Jadarius Commons, LCSWA 03/29/2019 10:52 AM

## 2019-03-29 NOTE — Progress Notes (Signed)
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 The patient stated that he is been abusing alcohol for several years, but recently had increased significantly.   Objective: Patient is seen and examined.  Patient is a 28 year old male with the above-stated past psychiatric history who is seen in follow-up.  He looks much better today.  He was significantly physically agitated yesterday.  Most likely secondary to withdrawal.  Today he is much less tremulous and feeling a bit better.  His blood pressure still elevated at 145/69, he still mildly tachycardic at a rate of 107.  He is afebrile.  He did sleep 6.75 hours last night.  His laboratories were drawn last p.m., and his electrolyte panel is normal.  CBC was completely normal.  TSH was normal at 2.619.  Drug screen was positive for benzodiazepines as well as marijuana.  His blood alcohol was negative.  His EKG showed a normal sinus rhythm.  He denied any suicidal or homicidal ideation.  He denied any auditory or visual hallucinations.  Principal Problem: <principal problem not specified> Diagnosis: Active Problems:   MDD (major depressive disorder)  Total Time spent with patient: 15 minutes  Past Psychiatric History: See admission H&P  Past Medical History:  Past Medical History:  Diagnosis Date  . ADD (attention deficit disorder)   . Drug abuse (HCC)   . Medical history non-contributory     Past Surgical History:  Procedure Laterality Date  . NO PAST SURGERIES     Family History: History reviewed. No pertinent family history. Family Psychiatric  History: See admission H&P Social History:  Social History   Substance  and Sexual Activity  Alcohol Use Yes     Social History   Substance and Sexual Activity  Drug Use Yes  . Types: Marijuana    Social History   Socioeconomic History  . Marital status: Single    Spouse name: Not on file  . Number of children: Not on file  . Years of education: Not on file  . Highest education level: Not on file  Occupational History  . Not on file  Social Needs  . Financial resource strain: Not on file  . Food insecurity    Worry: Not on file    Inability: Not on file  . Transportation needs    Medical: Not on file    Non-medical: Not on file  Tobacco Use  . Smoking status: Current Every Day Smoker  . Smokeless tobacco: Never Used  Substance and Sexual Activity  . Alcohol use: Yes  . Drug use: Yes    Types: Marijuana  . Sexual activity: Not on file  Lifestyle  . Physical activity    Days per week: Not on file    Minutes per session: Not on file  . Stress: Not on file  Relationships  . Social Musician on phone: Not on file    Gets together: Not on file    Attends religious service: Not on file    Active member of club or organization: Not on file    Attends meetings of clubs or organizations: Not on file    Relationship status: Not on file  Other Topics Concern  . Not  on file  Social History Narrative  . Not on file   Additional Social History:                         Sleep: Good  Appetite:  Fair  Current Medications: Current Facility-Administered Medications  Medication Dose Route Frequency Provider Last Rate Last Dose  . acetaminophen (TYLENOL) tablet 650 mg  650 mg Oral Q6H PRN Antonieta Pertlary, Atheena Spano Lawson, MD   650 mg at 03/28/19 1946  . alum & mag hydroxide-simeth (MAALOX/MYLANTA) 200-200-20 MG/5ML suspension 30 mL  30 mL Oral Q4H PRN Antonieta Pertlary, Tiona Ruane Lawson, MD      . folic acid (FOLVITE) tablet 1 mg  1 mg Oral Daily Antonieta Pertlary, Belmira Daley Lawson, MD   1 mg at 03/29/19 [id number]  . hydrOXYzine (ATARAX/VISTARIL) tablet 25 mg  25 mg Oral Q6H  PRN Antonieta Pertlary, Kimyetta Flott Lawson, MD   25 mg at 03/28/19 2107  . loperamide (IMODIUM) capsule 2-4 mg  2-4 mg Oral PRN Antonieta Pertlary, Lesslie Mossa Lawson, MD      . LORazepam (ATIVAN) tablet 1 mg  1 mg Oral Q6H PRN Antonieta Pertlary, Jaan Fischel Lawson, MD      . LORazepam (ATIVAN) tablet 1 mg  1 mg Oral QID Antonieta Pertlary, Malasha Kleppe Lawson, MD   1 mg at 03/29/19 0829   Followed by  . LORazepam (ATIVAN) tablet 1 mg  1 mg Oral TID Antonieta Pertlary, Starlit Raburn Lawson, MD       Followed by  . [START ON 03/30/2019] LORazepam (ATIVAN) tablet 1 mg  1 mg Oral BID Antonieta Pertlary, Dalonte Hardage Lawson, MD       Followed by  . [START ON 04/01/2019] LORazepam (ATIVAN) tablet 1 mg  1 mg Oral Daily Antonieta Pertlary, Mikael Skoda Lawson, MD      . multivitamin with minerals tablet 1 tablet  1 tablet Oral Daily Antonieta Pertlary, Lucilia Yanni Lawson, MD   1 tablet at 03/29/19 0829  . nicotine (NICODERM CQ - dosed in mg/24 hours) 21 mg/24hr patch           . ondansetron (ZOFRAN-ODT) disintegrating tablet 4 mg  4 mg Oral Q6H PRN Antonieta Pertlary, Jalaila Caradonna Lawson, MD      . thiamine (B-1) injection 100 mg  100 mg Intramuscular Once Antonieta Pertlary, Lorijean Husser Lawson, MD      . thiamine (VITAMIN B-1) tablet 100 mg  100 mg Oral Daily Antonieta Pertlary, Hayleigh Bawa Lawson, MD   100 mg at 03/29/19 [id number]    Lab Results:  Results for orders placed or performed during the hospital encounter of 03/28/19 (from the past 48 hour(s))  Ethanol     Status: None   Collection Time: 03/28/19  6:54 PM  Result Value Ref Range   Alcohol, Ethyl (B) <10 <10 mg/dL    Comment: (NOTE) Lowest detectable limit for serum alcohol is 10 mg/dL. For medical purposes only. Performed at [facility], [address] [place], [place] [address]   Hemoglobin A1c     Status: Abnormal   Collection Time: 03/28/19  6:54 PM  Result Value Ref Range   Hgb A1c MFr Bld 4.6 (L) 4.8 - 5.6 %    Comment: (NOTE) Pre diabetes:          5.7%-6.4% Diabetes:              >6.4% Glycemic control for   <7.0% adults with diabetes    Mean Plasma Glucose 85.32 mg/dL    Comment: Performed at [facility],  [address] [place], [place] [address]  Lipid panel     Status: Abnormal   Collection Time: 03/28/19  6:54 PM  Result Value Ref Range   Cholesterol 250 (H) 0 - 200 mg/dL   Triglycerides 64 <150 mg/dL   HDL >135 >40 mg/dL   Total CHOL/HDL Ratio NOT CALCULATED RATIO   VLDL 13 0 - 40 mg/dL   LDL Cholesterol NOT CALCULATED 0 - 99 mg/dL    Comment: Performed at [facility], [place] [address], [place], [place] [address]  Comprehensive metabolic panel     Status: Abnormal   Collection Time: 03/28/19  6:54 PM  Result Value Ref Range   Sodium 141 135 - 145 mmol/L   Potassium 4.3 3.5 - 5.1 mmol/L   Chloride 101 98 - 111 mmol/L   CO2 27 22 - 32 mmol/L   Glucose, Bld 114 (H) 70 - 99 mg/dL   BUN 15 6 - 20 mg/dL   Creatinine, Ser 0.94 0.61 - 1.24 mg/dL   Calcium 9.9 8.9 - 10.3 mg/dL   Total Protein 7.5 6.5 - 8.1 g/dL   Albumin 4.5 3.5 - 5.0 g/dL   AST 32 15 - 41 U/L   ALT 27 0 - 44 U/L   Alkaline Phosphatase 61 38 - 126 U/L   Total Bilirubin 1.7 (H) 0.3 - 1.2 mg/dL   GFR calc non Af Amer >60 >60 mL/min   GFR calc Af Amer >60 >60 mL/min   Anion gap 13 5 - 15    Comment: Performed at [facility], [place] [address] [place], [place] [address]  TSH     Status: None   Collection Time: 03/28/19  6:54 PM  Result Value Ref Range   TSH 2.619 0.350 - 4.500 uIU/mL    Comment: Performed by a 3rd Generation assay with a functional sensitivity of <=0.01 uIU/mL. Performed at [facility], [place] [address], [place], [place] [address]   Urine rapid drug screen (hosp performed)not at [facility]     Status: Abnormal   Collection Time: 03/28/19  7:08 PM  Result Value Ref Range   Opiates NONE DETECTED NONE DETECTED   Cocaine NONE DETECTED NONE DETECTED   Benzodiazepines POSITIVE (A) NONE DETECTED   Amphetamines NONE DETECTED NONE DETECTED   Tetrahydrocannabinol POSITIVE (A) NONE DETECTED   Barbiturates NONE DETECTED NONE DETECTED    Comment: (NOTE) DRUG  SCREEN FOR MEDICAL PURPOSES ONLY.  IF CONFIRMATION IS NEEDED FOR ANY PURPOSE, NOTIFY LAB WITHIN 5 DAYS. LOWEST DETECTABLE LIMITS FOR URINE DRUG SCREEN Drug Class                     Cutoff (ng/mL) Amphetamine and metabolites    1000 Barbiturate and metabolites    200 Benzodiazepine                 932 Tricyclics and metabolites     300 Opiates and metabolites        300 Cocaine and metabolites        300 THC                            50 Performed at [facility], [place] [address] [place], [place] [address]   CBC     Status: None   Collection Time: 03/29/19  6:11 AM  Result Value Ref Range   WBC 6.7 4.0 - 10.5 K/uL   RBC 4.64 4.22 - 5.81 MIL/uL  Hemoglobin 15.1 13.0 - 17.0 g/dL   HCT 40.945.0 81.139.0 - 91.452.0 %   MCV 97.0 80.0 - 100.0 fL   MCH 32.5 26.0 - 34.0 pg   MCHC 33.6 30.0 - 36.0 g/dL   RDW 78.212.0 95.611.5 - 21.315.5 %   Platelets 229 150 - 400 K/uL   nRBC 0.0 0.0 - 0.2 %    Comment: Performed at [facility], [address] [place], [place] [address]    Blood Alcohol level:  Lab Results  Component Value Date   ETH <10 03/28/2019   ETH 97 (H) 03/27/2019    Metabolic Disorder Labs: Lab Results  Component Value Date   HGBA1C 4.6 (L) 03/28/2019   MPG 85.32 03/28/2019   No results found for: PROLACTIN Lab Results  Component Value Date   CHOL 250 (H) 03/28/2019   TRIG 64 03/28/2019   HDL >135 03/28/2019   CHOLHDL NOT CALCULATED 03/28/2019   VLDL 13 03/28/2019   LDLCALC NOT CALCULATED 03/28/2019    Physical Findings: AIMS:  , ,  ,  ,    CIWA:  CIWA-Ar Total: 4 COWS:     Musculoskeletal: Strength & Muscle Tone: within normal limits Gait & Station: normal Patient leans: N/A  Psychiatric Specialty Exam: Physical Exam  Nursing note and vitals reviewed. Constitutional: He is oriented to person, place, and time. He appears well-developed and well-nourished.  HENT:  Head: Normocephalic and atraumatic.  Respiratory: Effort normal.   Neurological: He is alert and oriented to person, place, and time.    ROS  Blood pressure (!) 145/69, pulse (!) 107, temperature 97.7 F (36.5 C), temperature source Oral, resp. rate 18, height 5\' 8"  (1.727 m), weight 76.2 kg, SpO2 100 %.Body mass index is 25.54 kg/m.  General Appearance: Casual  Eye Contact:  Good  Speech:  Normal Rate  Volume:  Decreased  Mood:  Anxious and Dysphoric  Affect:  Congruent  Thought Process:  Coherent and Descriptions of Associations: Intact  Orientation:  Full (Time, Place, and Person)  Thought Content:  Logical  Suicidal Thoughts:  No  Homicidal Thoughts:  No  Memory:  Immediate;   Fair Recent;   Fair Remote;   Fair  Judgement:  Intact  Insight:  Fair  Psychomotor Activity:  Increased  Concentration:  Concentration: Fair and Attention Span: Fair  Recall:  Fis[employer]Fair  Fund of Knowledge:  Fair  Language:  Good  Akathisia:  Negative  Handed:  Right  AIMS (if indicated):     Assets:  Desire for Improvement Resilience  ADL's:  Intact  Cognition:  WNL  Sleep:  Number of Hours: 6.75     Treatment Plan Summary: Daily contact with patient to assess and evaluate symptoms and progress in treatment, Medication management and Plan : Patient is seen and examined.  Patient is a 28 year old male with the above-stated past psychiatric history who is seen in follow-up.   Diagnosis: #1 alcohol dependence, #2 alcohol withdrawal, #3 substance-induced mood disorder, #4 substance-induced anxiety disorder, #5 marijuana use disorder.  Patient appears to be doing better today.  He is much less tremulous than he was, and his blood pressure is mildly elevated but not significantly as bad as yesterday.  He will continue on the lorazepam taper.  We will continue seizure precautions.  If his blood pressure remains elevated I will add medication accordingly.  No change in his treatment today. 1.  Continue lorazepam detox protocol taper for alcohol dependence and  withdrawal. 2.  Continue folic  acid 1 mg p.o. daily for nutritional supplementation. 3.  Continue hydroxyzine 25 mg p.o. every 6 hours as needed anxiety. 4.  Continue multivitamin 1 tablet p.o. daily for nutritional supplementation. 5.  Continue thiamine 100 mg p.o. daily for nutritional supplementation. 6.  Monitor blood pressure. 7.  Disposition planning-in progress.  Antonieta PertGreg Lawson Laquandra Carrillo, MD 03/29/2019, 9:08 AM

## 2019-03-29 NOTE — Progress Notes (Signed)
Recreation Therapy Notes  Date:  6.19.20 Time: 0930 Location: 300 Hall Dayroom  Group Topic: Stress Management  Goal Area(s) Addresses:  Patient will identify positive stress management techniques. Patient will identify benefits of using stress management post d/c.  Intervention: Stress Management  Activity :  Progressive Muscle Relaxation.  LRT introduced the stress management technique of progressive muscle relaxation.  LRT lead patients in tensing each muscle group individually then releasing it.  Patients were to follow along as LRT lead them through the exercise.  Education:  Stress Management, Discharge Planning.   Education Outcome: Acknowledges Education  Clinical Observations/Feedback: Pt did not attend group.     Victorino Sparrow, LRT/CTRS         Ria Comment, Jerine Surles A 03/29/2019 11:11 AM

## 2019-03-29 NOTE — Progress Notes (Signed)
D: Pt was in hallway upon initial approach.  Pt presents with anxious, depressed affect and mood.  When asked about his goal, he states "I achieved it- trying new coping mechanisms."  Pt discusses how he will be getting aftercare treatment at the [facility] when he leaves [facility].  Pt denies SI/HI, denies hallucinations, reports pain from headache of 8/10.  Pt has been visible in milieu interacting with peers and staff appropriately.     A: Introduced self to pt.  Met with pt 1:1.  Actively listened to pt and offered support and encouragement.  PRN medication administered for pain and anxiety.  15 minute safety checks in place.  R: Pt is safe on the unit.  Pt is compliant with medications.  Pt verbally contracts for safety.  Will continue to monitor and assess.

## 2019-03-29 NOTE — Progress Notes (Signed)
[place] NOVEL CORONAVIRUS (COVID-19) DAILY CHECK-OFF SYMPTOMS - answer yes or no to each - every day NO YES  Have you had a fever in the past 24 hours?  . Fever (Temp > 37.80C / 100F) X   Have you had any of these symptoms in the past 24 hours? . New Cough .  Sore Throat  .  Shortness of Breath .  Difficulty Breathing .  Unexplained Body Aches   X   Have you had any one of these symptoms in the past 24 hours not related to allergies?   . Runny Nose .  Nasal Congestion .  Sneezing   X   If you have had runny nose, nasal congestion, sneezing in the past 24 hours, has it worsened?  X   EXPOSURES - check yes or no X   Have you traveled outside the state in the past 14 days?  X   Have you been in contact with someone with a confirmed diagnosis of COVID-19 or PUI in the past 14 days without wearing appropriate PPE?  X   Have you been living in the same home as a person with confirmed diagnosis of COVID-19 or a PUI (household contact)?    X   Have you been diagnosed with COVID-19?    X              What to do next: Answered NO to all: Answered YES to anything:   Proceed with unit schedule Follow the [facility] Inpatient Flowsheet.   

## 2019-03-29 NOTE — Plan of Care (Signed)
  Problem: Education: Goal: Knowledge of [facility] Education information/materials will improve Outcome: Progressing   
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
## 2019-03-29 NOTE — Progress Notes (Signed)
D Pt is observed OOB UAL on the 300 hall today. HE wears his own street clothes, he is cooperative, a little nervous, his hygiene is good. He endorses a flat, depressed affect and he says " I guess I'm fdoing ok".     A HE completed his daily assessment this am and on this he wrote he denied having active SI today and he rated his depression and anxiety " 4/2", respectively. He did not rate his hopelessenss. APprox 1045, he complained to this writer that he felt anxious, that his vision was blurred and that he was "withdrawing". His BP was checked 148/92 HRR 94 and he was given  1 mg ativan po prn per MD order.      R Safety in place.

## 2019-03-30 MED ORDER — POLYETHYLENE GLYCOL 3350 17 G PO PACK
17.0000 g | PACK | Freq: Every day | ORAL | Status: DC | PRN
  Filled 2019-03-30: qty 1

## 2019-03-30 MED ORDER — SERTRALINE HCL 25 MG PO TABS
25.0000 mg | ORAL_TABLET | Freq: Every day | ORAL | Status: DC
  Administered 2019-03-30: 25 mg via ORAL
  Filled 2019-03-30 (×3): qty 1

## 2019-03-30 MED ORDER — IBUPROFEN 600 MG PO TABS
600.0000 mg | ORAL_TABLET | Freq: Four times a day (QID) | ORAL | Status: DC | PRN
  Administered 2019-03-30: 600 mg via ORAL
  Filled 2019-03-30: qty 1

## 2019-03-30 MED ORDER — DOCUSATE SODIUM 100 MG PO CAPS
100.0000 mg | ORAL_CAPSULE | Freq: Two times a day (BID) | ORAL | Status: DC | PRN
  Administered 2019-03-30: 100 mg via ORAL
  Filled 2019-03-30: qty 1

## 2019-03-30 MED ORDER — ACETAMINOPHEN 500 MG PO TABS
ORAL_TABLET | ORAL | Status: AC
  Filled 2019-03-30: qty 1

## 2019-03-30 NOTE — Progress Notes (Addendum)
[facility] MD Progress Note  03/30/2019 11:02 AM Brett Sanford  MRN:  [id number] Subjective:  Brett Sanford reported " feeling better than on admission."  Evaluation: Brett Sanford is awake, alert and oriented x3. Currently denying suicidal or homicidal ideations.  Patient presents pleasant, calm and cooperative.  Denies auditory visual hallucinations.  Reported history of depression and alcohol abuse.  Reports he was prescribed Zoloft and Wellbutrin in the past however has been off medication for a while.  Currently rating his depression 8 out of 10 with 10 being the worst.  Discussed restarting Zoloft.  Patient was agreeable to plan.  Patient reports he has plans to discharge tomorrow and [facility] the [facility].  NP to follow-up with discharge disposition with psychiatry and social work.  Reports a good appetite.  States he is resting well throughout the night.  Denies any alcohol withdrawal symptoms and/or cravings.  Support, encouragement  and reassurance was provided.   Principal Problem: MDD (major depressive disorder) Diagnosis: Principal Problem:   MDD (major depressive disorder)  Total Time spent with patient: 15 minutes  Past Psychiatric History:   Past Medical History:  Past Medical History:  Diagnosis Date  . ADD (attention deficit disorder)   . Drug abuse ([place])   . Medical history non-contributory     Past Surgical History:  Procedure Laterality Date  . NO PAST SURGERIES     Family History: History reviewed. No pertinent family history. Family Psychiatric  History:  Social History:  Social History   Substance and Sexual Activity  Alcohol Use Yes     Social History   Substance and Sexual Activity  Drug Use Yes  . Types: Marijuana    Social History   Socioeconomic History  . Marital status: Single    Spouse name: Not on file  . Number of children: Not on file  . Years of education: Not on file  . Highest education level: Not on file  Occupational History  . Not on file   Social Needs  . Financial resource strain: Not on file  . Food insecurity    Worry: Not on file    Inability: Not on file  . Transportation needs    Medical: Not on file    Non-medical: Not on file  Tobacco Use  . Smoking status: Current Every Day Smoker  . Smokeless tobacco: Never Used  Substance and Sexual Activity  . Alcohol use: Yes  . Drug use: Yes    Types: Marijuana  . Sexual activity: Not on file  Lifestyle  . Physical activity    Days per week: Not on file    Minutes per session: Not on file  . Stress: Not on file  Relationships  . Social Herbalist on phone: Not on file    Gets together: Not on file    Attends religious service: Not on file    Active member of club or organization: Not on file    Attends meetings of clubs or organizations: Not on file    Relationship status: Not on file  Other Topics Concern  . Not on file  Social History Narrative  . Not on file   Additional Social History:                         Sleep: Fair  Appetite:  Fair  Current Medications: Current Facility-Administered Medications  Medication Dose Route Frequency Provider Last Rate Last Dose  . acetaminophen (TYLENOL)  tablet 650 mg  650 mg Oral Q6H PRN Antonieta Pertlary, Greg Lawson, MD   650 mg at 03/29/19 1934  . alum & mag hydroxide-simeth (MAALOX/MYLANTA) 200-200-20 MG/5ML suspension 30 mL  30 mL Oral Q4H PRN Antonieta Pertlary, Greg Lawson, MD      . folic acid (FOLVITE) tablet 1 mg  1 mg Oral Daily Antonieta Pertlary, Greg Lawson, MD   1 mg at 03/30/19 0908  . hydrOXYzine (ATARAX/VISTARIL) tablet 25 mg  25 mg Oral Q6H PRN Antonieta Pertlary, Greg Lawson, MD   25 mg at 03/29/19 2058  . loperamide (IMODIUM) capsule 2-4 mg  2-4 mg Oral PRN Antonieta Pertlary, Greg Lawson, MD      . LORazepam (ATIVAN) tablet 1 mg  1 mg Oral Q6H PRN Antonieta Pertlary, Greg Lawson, MD      . LORazepam (ATIVAN) tablet 1 mg  1 mg Oral BID Antonieta Pertlary, Greg Lawson, MD       Followed by  . [START ON 04/01/2019] LORazepam (ATIVAN) tablet 1 mg  1 mg Oral Daily  Antonieta Pertlary, Greg Lawson, MD      . multivitamin with minerals tablet 1 tablet  1 tablet Oral Daily Antonieta Pertlary, Greg Lawson, MD   1 tablet at 03/30/19 0908  . ondansetron (ZOFRAN-ODT) disintegrating tablet 4 mg  4 mg Oral Q6H PRN Antonieta Pertlary, Greg Lawson, MD      . sertraline (ZOLOFT) tablet 25 mg  25 mg Oral Daily Oneta RackLewis, Manali Mcelmurry N, NP      . thiamine (B-1) injection 100 mg  100 mg Intramuscular Once Antonieta Pertlary, Greg Lawson, MD      . thiamine (VITAMIN B-1) tablet 100 mg  100 mg Oral Daily Antonieta Pertlary, Greg Lawson, MD   100 mg at 03/30/19 [id number]    Lab Results:  Results for orders placed or performed during the hospital encounter of 03/28/19 (from the past 48 hour(s))  Ethanol     Status: None   Collection Time: 03/28/19  6:54 PM  Result Value Ref Range   Alcohol, Ethyl (B) <10 <10 mg/dL    Comment: (NOTE) Lowest detectable limit for serum alcohol is 10 mg/dL. For medical purposes only. Performed at [facility], [address] [place], [place] [address]   Hemoglobin A1c     Status: Abnormal   Collection Time: 03/28/19  6:54 PM  Result Value Ref Range   Hgb A1c MFr Bld 4.6 (L) 4.8 - 5.6 %    Comment: (NOTE) Pre diabetes:          5.7%-6.4% Diabetes:              >6.4% Glycemic control for   <7.0% adults with diabetes    Mean Plasma Glucose 85.32 mg/dL    Comment: Performed at [facility], [address] [place], [place] [address]  Lipid panel     Status: Abnormal   Collection Time: 03/28/19  6:54 PM  Result Value Ref Range   Cholesterol 250 (H) 0 - 200 mg/dL   Triglycerides 64 <244<150 mg/dL   HDL >010>135 >27>40 mg/dL   Total CHOL/HDL Ratio NOT CALCULATED RATIO   VLDL 13 0 - 40 mg/dL   LDL Cholesterol NOT CALCULATED 0 - 99 mg/dL    Comment: Performed at [facility], [address] [place], [place] [address]  Comprehensive metabolic panel     Status: Abnormal   Collection Time: 03/28/19  6:54 PM  Result Value Ref Range   Sodium 141 135 - 145 mmol/L   Potassium 4.3  3.5 -  5.1 mmol/L   Chloride 101 98 - 111 mmol/L   CO2 27 22 - 32 mmol/L   Glucose, Bld 114 (H) 70 - 99 mg/dL   BUN 15 6 - 20 mg/dL   Creatinine, Ser 0.980.94 0.61 - 1.24 mg/dL   Calcium 9.9 8.9 - 11.910.3 mg/dL   Total Protein 7.5 6.5 - 8.1 g/dL   Albumin 4.5 3.5 - 5.0 g/dL   AST 32 15 - 41 U/L   ALT 27 0 - 44 U/L   Alkaline Phosphatase 61 38 - 126 U/L   Total Bilirubin 1.7 (H) 0.3 - 1.2 mg/dL   GFR calc non Af Amer >60 >60 mL/min   GFR calc Af Amer >60 >60 mL/min   Anion gap 13 5 - 15    Comment: Performed at [facility], [address] [place], [place] [address]  TSH     Status: None   Collection Time: 03/28/19  6:54 PM  Result Value Ref Range   TSH 2.619 0.350 - 4.500 uIU/mL    Comment: Performed by a 3rd Generation assay with a functional sensitivity of <=0.01 uIU/mL. Performed at [facility], [address] [place], [place] [address]   Urine rapid drug screen (hosp performed)not at [facility]     Status: Abnormal   Collection Time: 03/28/19  7:08 PM  Result Value Ref Range   Opiates NONE DETECTED NONE DETECTED   Cocaine NONE DETECTED NONE DETECTED   Benzodiazepines POSITIVE (A) NONE DETECTED   Amphetamines NONE DETECTED NONE DETECTED   Tetrahydrocannabinol POSITIVE (A) NONE DETECTED   Barbiturates NONE DETECTED NONE DETECTED    Comment: (NOTE) DRUG SCREEN FOR MEDICAL PURPOSES ONLY.  IF CONFIRMATION IS NEEDED FOR ANY PURPOSE, NOTIFY LAB WITHIN 5 DAYS. LOWEST DETECTABLE LIMITS FOR URINE DRUG SCREEN Drug Class                     Cutoff (ng/mL) Amphetamine and metabolites    1000 Barbiturate and metabolites    200 Benzodiazepine                 200 Tricyclics and metabolites     300 Opiates and metabolites        300 Cocaine and metabolites        300 THC                            50 Performed at [facility], [address] [place], [place] [address]   CBC     Status: None   Collection Time: 03/29/19  6:11 AM   Result Value Ref Range   WBC 6.7 4.0 - 10.5 K/uL   RBC 4.64 4.22 - 5.81 MIL/uL   Hemoglobin 15.1 13.0 - 17.0 g/dL   HCT 78.445.0 69.639.0 - 29.552.0 %   MCV 97.0 80.0 - 100.0 fL   MCH 32.5 26.0 - 34.0 pg   MCHC 33.6 30.0 - 36.0 g/dL   RDW 28.412.0 13.211.5 - 44.015.5 %   Platelets 229 150 - 400 K/uL   nRBC 0.0 0.0 - 0.2 %    Comment: Performed at [facility], [address] [place], [place] [address]    Blood Alcohol level:  Lab Results  Component Value Date   ETH <10 03/28/2019   ETH 97 (H) 03/27/2019    Metabolic Disorder Labs: Lab Results  Component Value Date   HGBA1C 4.6 (L) 03/28/2019  MPG 85.32 03/28/2019   No results found for: PROLACTIN Lab Results  Component Value Date   CHOL 250 (H) 03/28/2019   TRIG 64 03/28/2019   HDL >135 03/28/2019   CHOLHDL NOT CALCULATED 03/28/2019   VLDL 13 03/28/2019   LDLCALC NOT CALCULATED 03/28/2019    Physical Findings: AIMS:  , ,  ,  ,    CIWA:  CIWA-Ar Total: 6 COWS:     Musculoskeletal: Strength & Muscle Tone: within normal limits Gait & Station: normal Patient leans: N/A  Psychiatric Specialty Exam: Physical Exam  Nursing note and vitals reviewed. Constitutional: He appears well-developed.  Psychiatric: He has a normal mood and affect. His behavior is normal.    Review of Systems  Psychiatric/Behavioral: Positive for depression. Negative for hallucinations and suicidal ideas. The patient is nervous/anxious and has insomnia.   All other systems reviewed and are negative.   Blood pressure 127/88, pulse (!) 104, temperature 98 F (36.7 C), temperature source Oral, resp. rate 20, height 5\' 8"  (1.727 m), weight 76.2 kg, SpO2 100 %.Body mass index is 25.54 kg/m.  General Appearance: Casual  Eye Contact:  Good  Speech:  Clear and Coherent  Volume:  Normal  Mood:  Anxious and Depressed  Affect:  Congruent  Thought Process:  Coherent  Orientation:  Full (Time, Place, and Person)  Thought Content:  Logical   Suicidal Thoughts:  No  Homicidal Thoughts:  No  Memory:  Immediate;   Fair Recent;   Fair Remote;   Fair  Judgement:  Fair  Insight:  Fair  Psychomotor Activity:  Normal  Concentration:  Concentration: Fair  Recall:  [employer]  Fund of Knowledge:  Fair  Language:  Fair  Akathisia:  No  Handed:  Right  AIMS (if indicated):     Assets:  Communication Skills Desire for Improvement Resilience Social Support  ADL's:  Intact  Cognition:  WNL  Sleep:  Number of Hours: 6.5     Treatment Plan Summary: Daily contact with patient to assess and evaluate symptoms and progress in treatment and Medication management   Continue with current treatment plan on 03/30/2019 as listed below except were noted  Restarted  Zoloft 25 mg p.o. daily Continue alcohol detox protocol Ativan- CIWA  CSW to continue working on discharge disposition. Patient encouraged to participate throughout the milieu.  Oneta Rackanika N Aleia Larocca, NP 03/30/2019, 11:02 AM

## 2019-03-30 NOTE — Progress Notes (Signed)
D Pt is observed OOB UAL on the 300 hall today. HE is bright. HE is cooperative, engaged in his recovery and this is evidenced by his statement" I am anxious to get my rehab started...ibuprofen know I gotta get some help with my drinking and Ive known for a long time I needed to do this..Internal Medicine glad to be getting help with this"> He makes good eye contact. HE is poised, articulate, his speech is organized., He is future--oriented.      A He completed hisd aily assessment and on this he wrote he denied SI today and he rated his depression, hopelessness and anxiety " 2/1/1/", respectively. He attends his scheduled groups as planned. \     R Safety is in place.

## 2019-03-30 NOTE — Progress Notes (Signed)
The patient's positive event for the day is that the staff set up his outpatient appointments for him. He also attended more groups than yesterday. His goal for tomorrow is to get discharged.

## 2019-03-30 NOTE — BHH Group Notes (Signed)
LCSW Group Therapy Note  03/30/2019     11:15AM-12:00PM  Type of Therapy and Topic:  Group Therapy:  Self Sabotage  Participation Level:  Active        . Description of Group:  Today's process group focused on the topic of Self Sabotage, what this is, and what methods of self-sabotage patients in the group have found themselves using.  Commonalities were then pointed out and the group explored possible benefits of choosing healthier coping skills.  Patients were asked to rate both their commitment to change and their confidence in their ability to change from 1 (lowest) to 10 (highest), then asked about their answers in order to provoke change talk.   Therapeutic Goals 1. Patient will be able to identify their typical methods of self sabotage. 2. Patient will list reasons they engage in these destructive behaviors, and harm that comes from them 3. Patient will be able verbalize the costs and benefits of drinking/drugging versus making the choice to change 4. Patient will rate their commitment to change and confidence about their ability to change, and will be guided to change talk.  Summary of Patient Progress: During group, patient expressed his typical manners of self-sabotage are people pleasing and alcohol consumption to the point of getting drunk as soon as he gets up, before he goes to work, while at work, after work, and before bed.  His commitment to change was rated at a 10 because he feels if he does not change the drinking he is going to be found dead in a ditch one day and confidence in his ability to change was rated 2 because he has no idea how to live a sober life.  Therapeutic Modalities Stages of Change Motivational Interviewing  Selmer Dominion, LCSW 03/30/2019, 1:12 PM

## 2019-03-30 NOTE — Progress Notes (Signed)

## 2019-03-30 NOTE — Plan of Care (Signed)
  Problem: Education: Goal: Knowledge of [facility] Education information/materials will improve Outcome: Progressing Goal: Emotional status will improve Outcome: Progressing   

## 2019-03-30 NOTE — BHH Group Notes (Signed)
[place] Group Notes:  (Nursing)  Date:  03/30/2019  Time: 130 PM Type of Therapy:  Nurse Education  Participation Level:  Active  Participation Quality:  Appropriate and Attentive  Affect:  Appropriate  Cognitive:  Alert and Appropriate  Insight:  Appropriate  Engagement in Group:  Engaged  Modes of Intervention:  Discussion and Education  Summary of Progress/Problems: Group played a non competitive learning, Data processing manager game that fosters listening skills as well as self expression.  Brett Sanford 03/30/2019, 3:21 PM

## 2019-03-31 NOTE — BHH Suicide Risk Assessment (Signed)
[facility] Discharge Suicide Risk Assessment   Principal Problem: MDD (major depressive disorder) Discharge Diagnoses: Principal Problem:   MDD (major depressive disorder)   Total Time spent with patient: 15 minutes  Musculoskeletal: Strength & Muscle Tone: within normal limits Gait & Station: normal Patient leans: N/A  Psychiatric Specialty Exam: Review of Systems  Neurological: Positive for headaches.  All other systems reviewed and are negative.   Blood pressure (!) 131/91, pulse 99, temperature 98.1 F (36.7 C), temperature source Oral, resp. rate 20, height 5\' 8"  (1.727 m), weight 76.2 kg, SpO2 100 %.Body mass index is 25.54 kg/m.  General Appearance: Casual  Eye Contact::  Good  Speech:  Normal Rate409  Volume:  Normal  Mood:  Euthymic  Affect:  Congruent  Thought Process:  Coherent and Descriptions of Associations: Intact  Orientation:  Full (Time, Place, and Person)  Thought Content:  Logical  Suicidal Thoughts:  No  Homicidal Thoughts:  No  Memory:  Immediate;   Fair Recent;   Fair Remote;   Fair  Judgement:  Intact  Insight:  Fair  Psychomotor Activity:  Increased  Concentration:  Fair  Recall:  [employer] of Knowledge:Fair  Language: Fair  Akathisia:  Negative  Handed:  Right  AIMS (if indicated):     Assets:  Desire for Improvement Resilience  Sleep:  Number of Hours: 5.5  Cognition: WNL  ADL's:  Intact   Mental Status Per Nursing Assessment::   On Admission:  Self-harm behaviors, Self-harm thoughts  Demographic Factors:  Male and Caucasian  Loss Factors: NA  Historical Factors: Impulsivity  Risk Reduction Factors:   Living with another person, especially a relative  Continued Clinical Symptoms:  Alcohol/Substance Abuse/Dependencies  Cognitive Features That Contribute To Risk:  None    Suicide Risk:  Minimal: No identifiable suicidal ideation.  Patients presenting with no risk factors but with morbid ruminations; may be classified as  minimal risk based on the severity of the depressive symptoms  [place] Follow up on 04/01/2019.   Specialty: Behavioral Health Why: Please attend your hospital follow up appointment on Monday, 6/22 at 4:00p.  Be sure to bring your photo ID, insurance card, and current medications.  Contact information: [address] [place] [address] [phone]           Plan Of Care/Follow-up recommendations:  Activity:  ad lib  Sharma Covert, MD 03/31/2019, 7:46 AM

## 2019-03-31 NOTE — Plan of Care (Signed)
  Problem: Education: Goal: Knowledge of [facility] Education information/materials will improve Outcome: Progressing Goal: Emotional status will improve Outcome: Progressing   

## 2019-03-31 NOTE — BHH Group Notes (Signed)
[place] LCSW Group Therapy Note  03/31/2019   10:00-11:00AM  Type of Therapy and Topic:  Group Therapy:  Unhealthy versus Healthy Supports, Which Am I?  Participation Level:  Active   Description of Group:  Patients in this group were introduced to the concept that additional supports including self-support are an essential part of recovery.  Initially a discussion was held about the differences between healthy versus unhealthy supports.  Patients were asked to share what unhealthy supports in their lives need to be addressed, as well as what additional healthy supports could be added for greater help in reaching their goals.   A song entitled "My Own Hero" was played and a group discussion ensued in which patients stated they could relate to the song and it inspired them to realize they have be willing to help themselves in order to succeed, because other people cannot achieve sobriety or stability for them.  We discussed adding a variety of healthy supports to address the various needs in patient lives, including becoming more self-supportive.  A song was played called "I Am Enough" toward the end of group and used to conduct an inspirational wrap-up to group.  Therapeutic Goals: 1)  Highlight the differences between healthy and unhealthy supports 2)  Suggest the importance of being a part of one's own support system 2)  Discuss reasons people in one's life may eventually be unable to be continually supportive  3)  Identify the patient's current support system   4) Elicit commitments to add healthy supports and to become more conscious of being self-supportive   Summary of Patient Progress:  The patient listed as healthy supports the following:  Mother (sometimes), sister, and fiancee.  The patient expressed that unhealthy supports to be addressed include older brother and self.  Patient feels they are an unhealthy support for themselves.  Healthy supports which could be added for increased stability  and happiness include professional supportss.  Therapeutic Modalities:   Motivational Interviewing Activity  Maretta Los , MSW, LCSW

## 2019-03-31 NOTE — Discharge Summary (Signed)
Physician Discharge Summary Note  Patient:  Brett Sanford is an 28 y.o., male MRN:  [id number] DOB:  September 11, 1991 Patient phone:  [phone] (home)  Patient address:   [place] [address],  Total Time spent with patient: 15 minutes  Date of Admission:  03/28/2019 Date of Discharge: 03/31/2019   Reason for Admission: Per admission assessment note-Brett Sanford is an 28 y.o. male.who presents as a walk-in to [facility] accompanied alone. Patient endorses feeling suicidal. He states that if there was a way that he would kill himself it would be by overdosing on pills. He endorses a signficnat history of alcohol abuse. Reports drinking at least of, " 5th of Vodka dialy." He reports his last use of alcohol was today at 1 pm. Reports drinking at least a half of 5th today. He also reports daily use of mariajuana and occasional uuse of LSD and molly. Reports he has had blackouts related to his alcohol abuse and states many years ago, he had a seizure after using LSD. He denies AVH or other psychosis. Denies homicidal ideations. Reports he has a psychiatric diagnosis of depression and ADHD. Reports feeling depressed and hopeless. Reports having experienced anxiety and panic attacks. He currently has no outpatient psychiatric services.    Principal Problem: MDD (major depressive disorder) Discharge Diagnoses: Principal Problem:   MDD (major depressive disorder)   Past Psychiatric History:   Past Medical History:  Past Medical History:  Diagnosis Date  . ADD (attention deficit disorder)   . Drug abuse ([place])   . Medical history non-contributory     Past Surgical History:  Procedure Laterality Date  . NO PAST SURGERIES     Family History: History reviewed. No pertinent family history. Family Psychiatric  History:  Social History:  Social History   Substance and Sexual Activity  Alcohol Use Yes     Social History   Substance and Sexual Activity  Drug Use Yes  . Types:  Marijuana    Social History   Socioeconomic History  . Marital status: Single    Spouse name: Not on file  . Number of children: Not on file  . Years of education: Not on file  . Highest education level: Not on file  Occupational History  . Not on file  Social Needs  . Financial resource strain: Not on file  . Food insecurity    Worry: Not on file    Inability: Not on file  . Transportation needs    Medical: Not on file    Non-medical: Not on file  Tobacco Use  . Smoking status: Current Every Day Smoker  . Smokeless tobacco: Never Used  Substance and Sexual Activity  . Alcohol use: Yes  . Drug use: Yes    Types: Marijuana  . Sexual activity: Not on file  Lifestyle  . Physical activity    Days per week: Not on file    Minutes per session: Not on file  . Stress: Not on file  Relationships  . Social Herbalist on phone: Not on file    Gets together: Not on file    Attends religious service: Not on file    Active member of club or organization: Not on file    Attends meetings of clubs or organizations: Not on file    Relationship status: Not on file  Other Topics Concern  . Not on file  Social History Narrative  . Not on file  Hospital Course:  Brett Sanford was admitted for MDD (major depressive disorder) and crisis management.  Pt was treated discharged with the medications listed below under Medication List.  Medical problems were identified and treated as needed.  Home medications were restarted as appropriate.  Improvement was monitored by observation and Brett Sanford 's daily report of symptom reduction.  Emotional and mental status was monitored by daily self-inventory reports completed by Brett GaribaldiSeth P Consalvo and clinical staff.         Brett GaribaldiSeth P Pompa was evaluated by the treatment team for stability and plans for continued recovery upon discharge. Brett Sanford 's motivation was an integral factor for scheduling further treatment. Employment,  transportation, bed availability, health status, family support, and any pending legal issues were also considered during hospital stay. Pt was offered further treatment options upon discharge including but not limited to Residential, Intensive Outpatient, and Outpatient treatment.  Brett Sanford will follow up with the services as listed below under Follow Up Information.     Upon completion of this admission the patient was both mentally and medically stable for discharge denying suicidal/homicidal ideation, auditory/visual/tactile hallucinations, delusional thoughts and paranoia.     Brett Sanford responded well to treatment with Detox protocol and group sessions without adverse effects.  Pt demonstrated improvement without reported or observed adverse effects to the point of stability appropriate for outpatient management. Pertinent labs include:  for which outpatient follow-up is necessary for lab recheck as mentioned below. Reviewed CBC, CMP, BAL, and UDS + benzodiazapine and mariajuana ; all unremarkable aside from noted exceptions.   Physical Findings: AIMS:  , ,  ,  ,    CIWA:  CIWA-Ar Total: 4 COWS:     Musculoskeletal: Strength & Muscle Tone: within normal limits Gait & Station: normal Patient leans: N/A  Psychiatric Specialty Exam: See SRA by MD  Physical Exam  Nursing note and vitals reviewed. Constitutional: He appears well-developed.  Psychiatric: He has a normal mood and affect. His behavior is normal.    Review of Systems  Psychiatric/Behavioral: Positive for substance abuse. Negative for depression and hallucinations. The patient is nervous/anxious.   All other systems reviewed and are negative.   Blood pressure (!) 131/91, pulse 99, temperature 98.1 F (36.7 C), temperature source Oral, resp. rate 20, height 5\' 8"  (1.727 m), weight 76.2 kg, SpO2 100 %.Body mass index is 25.54 kg/m.    Have you used any form of tobacco in the last 30 days? (Cigarettes, Smokeless  Tobacco, Cigars, and/or Pipes): Yes  Has this patient used any form of tobacco in the last 30 days? (Cigarettes, Smokeless Tobacco, Cigars, and/or Pipes)  No  Blood Alcohol level:  Lab Results  Component Value Date   ETH <10 03/28/2019   ETH 97 (H) 03/27/2019    Metabolic Disorder Labs:  Lab Results  Component Value Date   HGBA1C 4.6 (L) 03/28/2019   MPG 85.32 03/28/2019   No results found for: PROLACTIN Lab Results  Component Value Date   CHOL 250 (H) 03/28/2019   TRIG 64 03/28/2019   HDL >135 03/28/2019   CHOLHDL NOT CALCULATED 03/28/2019   VLDL 13 03/28/2019   LDLCALC NOT CALCULATED 03/28/2019    See Psychiatric Specialty Exam and Suicide Risk Assessment completed by Attending Physician prior to discharge.  Discharge destination:  Other:  [facility]  Is patient on multiple antipsychotic therapies at discharge:  No   Has Patient had three or more failed trials of antipsychotic  monotherapy by history:  No  Recommended Plan for Multiple Antipsychotic Therapies: NA  Discharge Instructions    Diet - low sodium heart healthy   Complete by: As directed    Discharge instructions   Complete by: As directed    Take all medications as prescribed. Keep all follow-up appointments as scheduled.  Do not consume alcohol or use illegal drugs while on prescription medications. Report any adverse effects from your medications to your primary care provider promptly.  In the event of recurrent symptoms or worsening symptoms, call 911, a crisis hotline, or go to the nearest emergency department for evaluation.   Discharge instructions   Complete by: As directed    Take all medications as prescribed. Keep all follow-up appointments as scheduled.  Do not consume alcohol or use illegal drugs while on prescription medications. Report any adverse effects from your medications to your primary care provider promptly.  In the event of recurrent symptoms or worsening symptoms, call 911, a  crisis hotline, or go to the nearest emergency department for evaluation.   Increase activity slowly   Complete by: As directed    Increase activity slowly   Complete by: As directed      Allergies as of 03/31/2019      Reactions   Benadryl [diphenhydramine Hcl]       Medication List    STOP taking these medications   ibuprofen 600 MG tablet Commonly known as: ADVIL   neomycin-bacitracin-polymyxin ointment Commonly known as: NEOSPORIN      Follow-up Information    [facility] Follow up on 04/01/2019.   Specialty: Behavioral Health Why: Please attend your hospital follow up appointment on Monday, 6/22 at 4:00p.  Be sure to bring your photo ID, insurance card, and current medications.  Contact information: [address] [place] [address] [phone]           Follow-up recommendations:  Activity:  as tolorated Diet:  heart healthy   Comments:Take all medications as prescribed. Keep all follow-up appointments as scheduled.  Do not consume alcohol or use illegal drugs while on prescription medications. Report any adverse effects from your medications to your primary care provider promptly.  In the event of recurrent symptoms or worsening symptoms, call 911, a crisis hotline, or go to the nearest emergency department for evaluation.   Signed: Oneta Rackanika N Lewis, NP 03/31/2019, 7:49 AM

## 2019-03-31 NOTE — Progress Notes (Signed)
D: Pt was in dayroom upon initial approach.  Pt presents with anxious affect and mood.  He reports his day was "good" and his goal is "just to hopefully finalize everything that's going on outside of here and hopefully discharge tomorrow."  Pt reports he feels safe to discharge.  He is seen smiling and laughing on occasion tonight.  Pt denies SI/HI, denies hallucinations, reports pain from headache of 6/10.  Pt has been visible in milieu interacting with peers and staff appropriately.  Pt attended evening group.    A: Introduced self to pt.  Met with pt 1:1.  Actively listened to pt and offered support and encouragement.  PRN medication administered for pain and anxiety.  15 minute safety checks in place.  R: Pt is safe on the unit.  Pt is compliant with medications.  Pt verbally contracts for safety.  Will continue to monitor and assess.

## 2019-03-31 NOTE — Progress Notes (Signed)
Pt completed his daily assessment and ont his he wrote he  Denied havingSI today and he rated his depression, hopelessness and anxeity " 0/0/2", respectively. HE is given his dc instrucitons by this writer,they are discussed and reviewed with him and he is givne cc of all documents ( SRA, AVS, SSP and trnasition reocrd). He is given all bleongings in his locker and he is escorted to bldg entrnace and dc'd to home ambulatory per MD orde.r

## 2019-03-31 NOTE — Progress Notes (Signed)
[facility] NOVEL CORONAVIRUS (COVID-19) DAILY CHECK-OFF SYMPTOMS - answer yes or no to each - every day NO YES  Have you had a fever in the past 24 hours?  . Fever (Temp > 37.80C / 100F) X   Have you had any of these symptoms in the past 24 hours? . New Cough .  Sore Throat  .  Shortness of Breath .  Difficulty Breathing .  Unexplained Body Aches   X   Have you had any one of these symptoms in the past 24 hours not related to allergies?   . Runny Nose .  Nasal Congestion .  Sneezing   X   If you have had runny nose, nasal congestion, sneezing in the past 24 hours, has it worsened?  X   EXPOSURES - check yes or no X   Have you traveled outside the state in the past 14 days?  X   Have you been in contact with someone with a confirmed diagnosis of COVID-19 or PUI in the past 14 days without wearing appropriate PPE?  X   Have you been living in the same home as a person with confirmed diagnosis of COVID-19 or a PUI (household contact)?    X   Have you been diagnosed with COVID-19?    X              What to do next: Answered NO to all: Answered YES to anything:   Proceed with unit schedule Follow the [facility] Inpatient Flowsheet.   

## 2019-03-31 NOTE — Progress Notes (Signed)
  [facility] Adult Case Management Discharge Plan :  Will you be returning to the same living situation after discharge:  Yes,  with parent and sibling At discharge, do you have transportation home?: Yes,  arranged by patient Do you have the ability to pay for your medications: Yes,  insurance  Release of information consent forms completed and turned in to Medical Records by CSW.   Patient to Follow up at: [place] Follow up on 04/01/2019.   Specialty: Behavioral Health Why: Please attend your hospital follow up appointment on Monday, 6/22 at 4:00p.  Be sure to bring your photo ID, insurance card, and current medications.  Contact information: [place] [address] [phone]           Next level of care provider has access to [facility] and Suicide Prevention discussed: No.  Have you used any form of tobacco in the last 30 days? (Cigarettes, Smokeless Tobacco, Cigars, and/or Pipes): Yes  Has patient been referred to the Quitline?: Patient refused referral  Patient has been referred for addiction treatment: Yes  Maretta Los, LCSW 03/31/2019, 8:28 AM

## 2019-05-06 ENCOUNTER — Encounter (HOSPITAL_COMMUNITY): Payer: Self-pay | Admitting: Emergency Medicine

## 2019-05-06 ENCOUNTER — Other Ambulatory Visit: Payer: Self-pay

## 2019-05-06 ENCOUNTER — Emergency Department (HOSPITAL_COMMUNITY)
Admission: EM | Admit: 2019-05-06 | Discharge: 2019-05-06 | Disposition: A | Discharge status: Home / Self Care | Payer: BC Managed Care – PPO | Attending: Emergency Medicine | Admitting: Emergency Medicine

## 2019-05-06 DIAGNOSIS — R197 Diarrhea, unspecified: Secondary | ICD-10-CM | POA: Insufficient documentation

## 2019-05-06 DIAGNOSIS — R112 Nausea with vomiting, unspecified: Secondary | ICD-10-CM | POA: Insufficient documentation

## 2019-05-06 DIAGNOSIS — F17200 Nicotine dependence, unspecified, uncomplicated: Secondary | ICD-10-CM | POA: Diagnosis not present

## 2019-05-06 LAB — CBC
HCT: 46 % (ref 39.0–52.0)
Hemoglobin: 15.6 g/dL (ref 13.0–17.0)
MCH: 31.5 pg (ref 26.0–34.0)
MCHC: 33.9 g/dL (ref 30.0–36.0)
MCV: 92.9 fL (ref 80.0–100.0)
Platelets: 259 10*3/uL (ref 150–400)
RBC: 4.95 MIL/uL (ref 4.22–5.81)
RDW: 11.9 % (ref 11.5–15.5)
WBC: 9.6 10*3/uL (ref 4.0–10.5)
nRBC: 0 % (ref 0.0–0.2)

## 2019-05-06 LAB — COMPREHENSIVE METABOLIC PANEL
ALT: 16 U/L (ref 0–44)
AST: 20 U/L (ref 15–41)
Albumin: 4.8 g/dL (ref 3.5–5.0)
Alkaline Phosphatase: 47 U/L (ref 38–126)
Anion gap: 11 (ref 5–15)
BUN: 14 mg/dL (ref 6–20)
CO2: 26 mmol/L (ref 22–32)
Calcium: 9.6 mg/dL (ref 8.9–10.3)
Chloride: 101 mmol/L (ref 98–111)
Creatinine, Ser: 1.01 mg/dL (ref 0.61–1.24)
GFR calc Af Amer: >60 mL/min (ref >60)
GFR calc non Af Amer: >60 mL/min (ref >60)
Glucose, Bld: 91 mg/dL (ref 70–99)
Potassium: 3.8 mmol/L (ref 3.5–5.1)
Sodium: 138 mmol/L (ref 135–145)
Total Bilirubin: 2.8 mg/dL — ABNORMAL HIGH (ref 0.3–1.2)
Total Protein: 7.9 g/dL (ref 6.5–8.1)

## 2019-05-06 LAB — LIPASE, BLOOD: Lipase: 22 U/L (ref 11–51)

## 2019-05-06 MED ORDER — MORPHINE SULFATE (PF) 4 MG/ML IV SOLN
4.0000 mg | Freq: Once | INTRAVENOUS | Status: AC
  Administered 2019-05-06: 05:00:00 4 mg via INTRAVENOUS
  Filled 2019-05-06: qty 1

## 2019-05-06 MED ORDER — SODIUM CHLORIDE 0.9 % IV BOLUS
1000.0000 mL | Freq: Once | INTRAVENOUS | Status: AC
  Administered 2019-05-06: 1000 mL via INTRAVENOUS

## 2019-05-06 MED ORDER — SODIUM CHLORIDE 0.9% FLUSH: 3.0000 mL | Freq: Once | INTRAVENOUS | Status: DC

## 2019-05-06 MED ORDER — ONDANSETRON 4 MG PO TBDP: 4.0000 mg | ORAL_TABLET | Freq: Three times a day (TID) | ORAL | 0 refills | Status: DC | PRN

## 2019-05-06 MED ORDER — ONDANSETRON HCL 4 MG/2ML IJ SOLN
4.0000 mg | Freq: Once | INTRAMUSCULAR | Status: AC
  Administered 2019-05-06: 05:00:00 4 mg via INTRAVENOUS
  Filled 2019-05-06: qty 2

## 2019-05-06 NOTE — ED Triage Notes (Signed)
Patient here from work with complaints of abd pain, n/v, diarrhea x3 days.

## 2019-05-06 NOTE — ED Provider Notes (Signed)
[facility] DEPT Provider Note   CSN: [id number] Arrival date & time: 05/06/19  0410     History   Chief Complaint Chief Complaint  Patient presents with  . Abdominal Pain  . Nausea  . Emesis  . Diarrhea    HPI Brett Sanford is a 28 y.o. male.     Patient presents to the emergency department with a chief complaint of 2 to 3 days of nausea, vomiting, and diarrhea.  He states that his symptoms initially started to improve yesterday, but then returned and he had some streaky blood in his vomit.  States that this prompted him to come to the emergency department.  He denies any fevers chills.  Denies having taken anything for symptoms.  Nothing makes his symptoms better or worse.  The history is provided by the patient. No language interpreter was used.    Past Medical History:  Diagnosis Date  . ADD (attention deficit disorder)   . Drug abuse ([place])   . Medical history non-contributory     Patient Active Problem List   Diagnosis Date Noted  . MDD (major depressive disorder) 03/28/2019    Past Surgical History:  Procedure Laterality Date  . NO PAST SURGERIES          Home Medications    Prior to Admission medications   Not on File    Family History No family history on file.  Social History Social History   Tobacco Use  . Smoking status: Current Every Day Smoker  . Smokeless tobacco: Never Used  Substance Use Topics  . Alcohol use: Yes  . Drug use: Yes    Types: Marijuana     Allergies   Benadryl [diphenhydramine hcl]   Review of Systems Review of Systems  All other systems reviewed and are negative.    Physical Exam Updated Vital Signs BP (!) 150/83 (BP Location: Left Arm)   Pulse 70   Temp 98.5 F (36.9 C) (Oral)   Resp 18   Ht 5\' 8"  (1.727 m)   Wt 77.1 kg   SpO2 100%   BMI 25.85 kg/m   Physical Exam Vitals signs and nursing note reviewed.  Constitutional:      Appearance: He is well-developed.   HENT:     Head: Normocephalic and atraumatic.  Eyes:     Conjunctiva/sclera: Conjunctivae normal.  Neck:     Musculoskeletal: Neck supple.  Cardiovascular:     Rate and Rhythm: Normal rate and regular rhythm.     Heart sounds: No murmur.  Pulmonary:     Effort: Pulmonary effort is normal. No respiratory distress.     Breath sounds: Normal breath sounds.  Abdominal:     Palpations: Abdomen is soft.     Tenderness: There is no abdominal tenderness.     Comments: No focal abdominal tenderness, no RLQ tenderness or pain at McBurney's point, no RUQ tenderness or Murphy's sign, no left-sided abdominal tenderness, no fluid wave, or signs of peritonitis   Musculoskeletal: Normal range of motion.  Skin:    General: Skin is warm and dry.  Neurological:     Mental Status: He is alert and oriented to person, place, and time.  Psychiatric:        Mood and Affect: Mood normal.        Behavior: Behavior normal.      ED Treatments / Results  Labs (all labs ordered are listed, but only abnormal results are displayed) Labs Reviewed  LIPASE, BLOOD  COMPREHENSIVE METABOLIC PANEL  CBC  URINALYSIS, ROUTINE W REFLEX MICROSCOPIC    EKG None  Radiology No results found.  Procedures Procedures (including critical care time)  Medications Ordered in ED Medications  sodium chloride flush (NS) 0.9 % injection 3 mL (has no administration in time range)  sodium chloride 0.9 % bolus 1,000 mL (has no administration in time range)  morphine 4 MG/ML injection 4 mg (has no administration in time range)  ondansetron (ZOFRAN) injection 4 mg (has no administration in time range)     Initial Impression / Assessment and Plan / ED Course  I have reviewed the triage vital signs and the nursing notes.  Pertinent labs & imaging results that were available during my care of the patient were reviewed by me and considered in my medical decision making (see chart for details).       Patient with  n/v/d x 2-3 days.  VSS.  Abdomen soft and non-tender.    Labs reassuring.  Feels better after fluids and meds.  Likely viral.  DC to home.  Return precautions given.  Final Clinical Impressions(s) / ED Diagnoses   Final diagnoses:  Nausea vomiting and diarrhea    ED Discharge Orders         Ordered    ondansetron (ZOFRAN ODT) 4 MG disintegrating tablet  Every 8 hours PRN     05/06/19 0605           Roxy HorsemanBrowning, Shashank Kwasnik, PA-C 05/06/19 [id number]    Mesner, Barbara CowerJason, MD 05/06/19 [phone]

## 2019-05-10 ENCOUNTER — Other Ambulatory Visit: Payer: Self-pay

## 2019-05-10 ENCOUNTER — Ambulatory Visit (HOSPITAL_COMMUNITY)
Admission: EM | Admit: 2019-05-10 | Discharge: 2019-05-10 | Disposition: A | Discharge status: Home / Self Care | Payer: BC Managed Care – PPO | Attending: Urgent Care | Admitting: Urgent Care

## 2019-05-10 ENCOUNTER — Encounter (HOSPITAL_COMMUNITY): Payer: Self-pay

## 2019-05-10 DIAGNOSIS — R195 Other fecal abnormalities: Secondary | ICD-10-CM | POA: Diagnosis present

## 2019-05-10 DIAGNOSIS — F1011 Alcohol abuse, in remission: Secondary | ICD-10-CM | POA: Insufficient documentation

## 2019-05-10 DIAGNOSIS — R112 Nausea with vomiting, unspecified: Secondary | ICD-10-CM | POA: Diagnosis not present

## 2019-05-10 DIAGNOSIS — R197 Diarrhea, unspecified: Secondary | ICD-10-CM

## 2019-05-10 MED ORDER — ESOMEPRAZOLE MAGNESIUM 20 MG PO CPDR: 20.0000 mg | DELAYED_RELEASE_CAPSULE | Freq: Every day | ORAL | 0 refills | Status: DC

## 2019-05-10 MED ORDER — LOPERAMIDE HCL 2 MG PO CAPS: 2.0000 mg | ORAL_CAPSULE | Freq: Every day | ORAL | 0 refills | Status: DC | PRN

## 2019-05-10 MED ORDER — ONDANSETRON 8 MG PO TBDP: 8.0000 mg | ORAL_TABLET | Freq: Three times a day (TID) | ORAL | 0 refills | Status: DC | PRN

## 2019-05-10 NOTE — ED Triage Notes (Signed)
Pt presents with diarrhea, nausea, and vomiting for over 1 week; was treated with zofran in ED a few days ago with no relief or change in symptoms.

## 2019-05-10 NOTE — ED Provider Notes (Signed)
MRN: [id number] DOB: 06/07/1991  Subjective:   Brett Sanford is a 28 y.o. male presenting for recheck on persistent nausea, vomiting, diarrhea.  Patient reports that he is having multiple loose stools per day and are dark.  He is concerned that this means he is having blood in his stool.  He admits that the nausea and vomiting have improved with the use of ondansetron as prescribed to him from his last ER visit on 05/06/2019.  He has at times gotten both nauseous and very feverish.  Reports that he recently got into rehab to help with alcohol abuse, was drinking very heavily just a few weeks ago, more than 1/5 of liquor per day.  He has not had alcohol in 3 to 4 weeks.  Has occasional marijuana use.  Denies other drug use.  No recent antibiotic use.   No current facility-administered medications for this encounter.   Current Outpatient Medications:  .  acetaminophen (TYLENOL) 325 MG tablet, Take 650 mg by mouth every 6 (six) hours as needed for moderate pain., Disp: , Rfl:  .  FLUoxetine (PROZAC) 40 MG capsule, Take 40 mg by mouth daily. , Disp: , Rfl:  .  naltrexone (DEPADE) 50 MG tablet, Take 50 mg by mouth daily., Disp: , Rfl:  .  ondansetron (ZOFRAN ODT) 4 MG disintegrating tablet, Take 1 tablet (4 mg total) by mouth every 8 (eight) hours as needed for nausea or vomiting., Disp: 10 tablet, Rfl: 0 .  VYVANSE 30 MG capsule, Take 30 mg by mouth daily. , Disp: , Rfl:  .  zaleplon (SONATA) 10 MG capsule, Take 10 mg by mouth at bedtime. , Disp: , Rfl:     Allergies  Allergen Reactions  . Benadryl [Diphenhydramine Hcl] Other (See Comments)    My pt hyper    Past Medical History:  Diagnosis Date  . ADD (attention deficit disorder)   . Drug abuse ([place])   . Medical history non-contributory      Past Surgical History:  Procedure Laterality Date  . NO PAST SURGERIES      Review of Systems  Constitutional: Negative for fever and malaise/fatigue.  HENT: Negative for congestion, ear  pain, sinus pain and sore throat.   Eyes: Negative for blurred vision, double vision, discharge and redness.  Respiratory: Negative for cough, hemoptysis, shortness of breath and wheezing.   Cardiovascular: Negative for chest pain.  Gastrointestinal: Positive for abdominal pain, blood in stool, diarrhea, nausea and vomiting.  Genitourinary: Negative for dysuria, flank pain and hematuria.  Musculoskeletal: Negative for myalgias.  Skin: Negative for rash.  Neurological: Negative for dizziness, weakness and headaches.  Psychiatric/Behavioral: Negative for depression and substance abuse.    Objective:   Vitals: BP 134/72 (BP Location: Left Arm)   Pulse 61   Temp 98.5 F (36.9 C) (Oral)   Resp 16   SpO2 100%   Physical Exam Constitutional:      Appearance: Normal appearance. He is well-developed and normal weight.  HENT:     Head: Normocephalic and atraumatic.     Right Ear: External ear normal.     Left Ear: External ear normal.     Nose: Nose normal.     Mouth/Throat:     Pharynx: Oropharynx is clear.  Eyes:     General: No scleral icterus.    Extraocular Movements: Extraocular movements intact.     Pupils: Pupils are equal, round, and reactive to light.  Cardiovascular:     Rate and Rhythm: Normal  rate and regular rhythm.     Heart sounds: No murmur. No friction rub. No gallop.   Pulmonary:     Effort: Pulmonary effort is normal. No respiratory distress.     Breath sounds: No wheezing or rales.  Abdominal:     General: Bowel sounds are normal. There is no distension.     Palpations: Abdomen is soft. There is no mass.     Tenderness: There is abdominal tenderness (Generalized upper abdominal pain). There is no guarding or rebound.  Skin:    General: Skin is warm and dry.  Neurological:     Mental Status: He is alert and oriented to person, place, and time.  Psychiatric:        Mood and Affect: Mood normal.        Behavior: Behavior normal.        Thought Content:  Thought content normal.        Judgment: Judgment normal.     Assessment and Plan :   1. Nausea vomiting and diarrhea   2. Dark stools   3. History of alcohol abuse     Stool culture pending, recommended adding loperamide and maintaining Zofran.  Will increase the Zofran to 8 mg ODT.  Encouraged to continue staying away from alcohol use.  We will have patient [facility] care [facility]. Counseled patient on potential for adverse effects with medications prescribed/recommended today, ER and return-to-clinic precautions discussed, patient verbalized understanding.    Wallis BambergMani, Jamear Carbonneau, [place]-C 05/10/19 1047

## 2019-05-13 LAB — GASTROINTESTINAL PANEL BY PCR, STOOL (REPLACES STOOL CULTURE)

## 2019-06-12 ENCOUNTER — Emergency Department (HOSPITAL_COMMUNITY)
Admission: EM | Admit: 2019-06-12 | Discharge: 2019-06-12 | Disposition: A | Discharge status: Home / Self Care | Payer: BC Managed Care – PPO | Attending: Emergency Medicine | Admitting: Emergency Medicine

## 2019-06-12 ENCOUNTER — Other Ambulatory Visit: Payer: Self-pay

## 2019-06-12 ENCOUNTER — Emergency Department (HOSPITAL_COMMUNITY): Payer: BC Managed Care – PPO

## 2019-06-12 DIAGNOSIS — F909 Attention-deficit hyperactivity disorder, unspecified type: Secondary | ICD-10-CM | POA: Diagnosis not present

## 2019-06-12 DIAGNOSIS — E876 Hypokalemia: Secondary | ICD-10-CM

## 2019-06-12 DIAGNOSIS — F1290 Cannabis use, unspecified, uncomplicated: Secondary | ICD-10-CM | POA: Diagnosis not present

## 2019-06-12 DIAGNOSIS — R197 Diarrhea, unspecified: Secondary | ICD-10-CM | POA: Insufficient documentation

## 2019-06-12 DIAGNOSIS — R251 Tremor, unspecified: Secondary | ICD-10-CM | POA: Diagnosis not present

## 2019-06-12 DIAGNOSIS — F17210 Nicotine dependence, cigarettes, uncomplicated: Secondary | ICD-10-CM | POA: Insufficient documentation

## 2019-06-12 DIAGNOSIS — Z79899 Other long term (current) drug therapy: Secondary | ICD-10-CM | POA: Insufficient documentation

## 2019-06-12 DIAGNOSIS — R112 Nausea with vomiting, unspecified: Secondary | ICD-10-CM | POA: Diagnosis not present

## 2019-06-12 DIAGNOSIS — R4182 Altered mental status, unspecified: Secondary | ICD-10-CM | POA: Diagnosis present

## 2019-06-12 LAB — CBC WITH DIFFERENTIAL/PLATELET
Abs Immature Granulocytes: 0.02 10*3/uL (ref 0.00–0.07)
Basophils Absolute: 0 10*3/uL (ref 0.0–0.1)
Basophils Relative: 1 %
Eosinophils Absolute: 0.1 10*3/uL (ref 0.0–0.5)
Eosinophils Relative: 2 %
HCT: 39.9 % (ref 39.0–52.0)
Hemoglobin: 13.3 g/dL (ref 13.0–17.0)
Immature Granulocytes: 1 %
Lymphocytes Relative: 30 %
Lymphs Abs: 1.2 10*3/uL (ref 0.7–4.0)
MCH: 31.7 pg (ref 26.0–34.0)
MCHC: 33.3 g/dL (ref 30.0–36.0)
MCV: 95 fL (ref 80.0–100.0)
Monocytes Absolute: 0.6 10*3/uL (ref 0.1–1.0)
Monocytes Relative: 15 %
Neutro Abs: 2.1 10*3/uL (ref 1.7–7.7)
Neutrophils Relative %: 51 %
Platelets: 230 10*3/uL (ref 150–400)
RBC: 4.2 MIL/uL — ABNORMAL LOW (ref 4.22–5.81)
RDW: 12.7 % (ref 11.5–15.5)
WBC: 4.1 10*3/uL (ref 4.0–10.5)
nRBC: 0 % (ref 0.0–0.2)

## 2019-06-12 LAB — COMPREHENSIVE METABOLIC PANEL
ALT: 25 U/L (ref 0–44)
AST: 29 U/L (ref 15–41)
Albumin: 3.4 g/dL — ABNORMAL LOW (ref 3.5–5.0)
Alkaline Phosphatase: 48 U/L (ref 38–126)
Anion gap: 13 (ref 5–15)
BUN: 9 mg/dL (ref 6–20)
CO2: 18 mmol/L — ABNORMAL LOW (ref 22–32)
Calcium: 7.9 mg/dL — ABNORMAL LOW (ref 8.9–10.3)
Chloride: 108 mmol/L (ref 98–111)
Creatinine, Ser: 0.82 mg/dL (ref 0.61–1.24)
GFR calc Af Amer: >60 mL/min (ref >60)
GFR calc non Af Amer: >60 mL/min (ref >60)
Glucose, Bld: 127 mg/dL — ABNORMAL HIGH (ref 70–99)
Potassium: 2.7 mmol/L — CL (ref 3.5–5.1)
Sodium: 139 mmol/L (ref 135–145)
Total Bilirubin: 2.1 mg/dL — ABNORMAL HIGH (ref 0.3–1.2)
Total Protein: 5.6 g/dL — ABNORMAL LOW (ref 6.5–8.1)

## 2019-06-12 MED ORDER — MAGNESIUM SULFATE 2 GM/50ML IV SOLN
2.0000 g | Freq: Once | INTRAVENOUS | Status: AC
  Administered 2019-06-12: 22:00:00 2 g via INTRAVENOUS
  Filled 2019-06-12: qty 50

## 2019-06-12 MED ORDER — HALOPERIDOL LACTATE 5 MG/ML IJ SOLN
5.0000 mg | Freq: Once | INTRAMUSCULAR | Status: AC
  Administered 2019-06-12: 5 mg via INTRAVENOUS
  Filled 2019-06-12: qty 1

## 2019-06-12 MED ORDER — POTASSIUM CHLORIDE CRYS ER 20 MEQ PO TBCR
40.0000 meq | EXTENDED_RELEASE_TABLET | Freq: Once | ORAL | Status: AC
  Administered 2019-06-12: 40 meq via ORAL
  Filled 2019-06-12: qty 2

## 2019-06-12 MED ORDER — LORAZEPAM 2 MG/ML IJ SOLN
INTRAMUSCULAR | Status: AC
  Filled 2019-06-12: qty 1

## 2019-06-12 MED ORDER — POTASSIUM CHLORIDE 10 MEQ/100ML IV SOLN
10.0000 meq | INTRAVENOUS | Status: AC
  Administered 2019-06-12 (×2): 10 meq via INTRAVENOUS
  Filled 2019-06-12 (×2): qty 100

## 2019-06-12 MED ORDER — NALOXONE HCL 0.4 MG/ML IJ SOLN
0.4000 mg | Freq: Once | INTRAMUSCULAR | Status: AC
  Administered 2019-06-12: 19:00:00 0.4 mg via INTRAVENOUS

## 2019-06-12 MED ORDER — SODIUM CHLORIDE 0.9 % IV BOLUS
1000.0000 mL | Freq: Once | INTRAVENOUS | Status: AC
  Administered 2019-06-12: 1000 mL via INTRAVENOUS

## 2019-06-12 MED ORDER — DROPERIDOL 2.5 MG/ML IJ SOLN: 5.0000 mg | Freq: Once | INTRAMUSCULAR | Status: DC

## 2019-06-12 NOTE — Discharge Instructions (Signed)
Your potassium level was low.  Follow up with a GI doctor for your dark stools, and a neurologist for your shaking spells.   You should not drive for 6 months or until you are cleared by a neurologist No climbing to tall heights, swimming by yourself until cleared.   Try and eat a food high in potassium each meal for the next week.

## 2019-06-12 NOTE — ED Provider Notes (Signed)
[facility] Provider Note   CSN: [id number] Arrival date & time: 06/12/19  1920     History   Chief Complaint Chief Complaint  Patient presents with  . Unresponsive    HPI Brett Sanford is a 28 y.o. male.     28 yo M with a chief complaints of altered mental status.  EMS came out and felt like the patient was hypoglycemic.  This is self-reported by the patient.  His blood sugar was 77 and so they gave him a bolus of D50 with improvement of both his mental status and his blood sugar.  He then had a repeat change in his mental status and so he was given oral glucose without significant improvement.  The patient upon arrival had seizure-like activity.    The patient was able to wake up and converse with me.  States that he has not had anything to drink all day.  Felt like his blood sugar was low.  He denies cough congestion or fever denies abdominal pain denies chest pain.  Denies narcotic abuse.  The history is provided by the patient.  Illness Severity:  Moderate Onset quality:  Gradual Duration:  2 days Timing:  Constant Progression:  Worsening Chronicity:  New Associated symptoms: diarrhea, nausea and vomiting   Associated symptoms: no abdominal pain, no chest pain, no congestion, no fever, no headaches, no myalgias, no rash and no shortness of breath     Past Medical History:  Diagnosis Date  . ADD (attention deficit disorder)   . Drug abuse (HCC)   . Medical history non-contributory     Patient Active Problem List   Diagnosis Date Noted  . MDD (major depressive disorder) 03/28/2019    Past Surgical History:  Procedure Laterality Date  . NO PAST SURGERIES          Home Medications    Prior to Admission medications   Medication Sig Start Date End Date Taking? Authorizing Provider  acetaminophen (TYLENOL) 325 MG tablet Take 650 mg by mouth every 6 (six) hours as needed for moderate pain.    [provider]   esomeprazole (NEXIUM) 20 MG capsule Take 1 capsule (20 mg total) by mouth daily before breakfast. 05/10/19   Wallis BambergMani, Mario, PA-C  FLUoxetine (PROZAC) 40 MG capsule Take 40 mg by mouth daily.  05/03/19   [provider]  loperamide (IMODIUM) 2 MG capsule Take 1 capsule (2 mg total) by mouth daily as needed for diarrhea or loose stools. 05/10/19   Wallis BambergMani, Mario, PA-C  naltrexone (DEPADE) 50 MG tablet Take 50 mg by mouth daily.    [provider]  ondansetron (ZOFRAN-ODT) 8 MG disintegrating tablet Take 1 tablet (8 mg total) by mouth every 8 (eight) hours as needed for nausea or vomiting. 05/10/19   Wallis BambergMani, Mario, PA-C  VYVANSE 30 MG capsule Take 30 mg by mouth daily.  05/02/19   [provider]  zaleplon (SONATA) 10 MG capsule Take 10 mg by mouth at bedtime.  05/03/19   [provider]    Family History Family History  Family history unknown: Yes    Social History Social History   Tobacco Use  . Smoking status: Current Every Day Smoker  . Smokeless tobacco: Never Used  Substance Use Topics  . Alcohol use: Yes  . Drug use: Yes    Types: Marijuana     Allergies   Benadryl [diphenhydramine hcl]   Review of Systems Review of Systems  Constitutional: Negative for chills and fever.  HENT: Negative for congestion and facial swelling.   Eyes: Negative for discharge and visual disturbance.  Respiratory: Negative for shortness of breath.   Cardiovascular: Negative for chest pain and palpitations.  Gastrointestinal: Positive for blood in stool, diarrhea, nausea and vomiting. Negative for abdominal pain.  Musculoskeletal: Negative for arthralgias and myalgias.  Skin: Negative for color change and rash.  Neurological: Positive for seizures. Negative for tremors, syncope and headaches.  Psychiatric/Behavioral: Negative for confusion and dysphoric mood.     Physical Exam Updated Vital Signs BP (!) 114/92   Pulse 80   Temp 98.7 F (37.1 C) (Tympanic)   Resp  16   Ht 5\' 8"  (1.727 m)   Wt 77.1 kg   SpO2 99%   BMI 25.85 kg/m   Physical Exam Vitals signs and nursing note reviewed.  Constitutional:      Appearance: He is well-developed.  HENT:     Head: Normocephalic and atraumatic.  Eyes:     Pupils: Pupils are equal, round, and reactive to light.  Neck:     Musculoskeletal: Normal range of motion and neck supple.     Vascular: No JVD.  Cardiovascular:     Rate and Rhythm: Normal rate and regular rhythm.     Heart sounds: No murmur. No friction rub. No gallop.   Pulmonary:     Effort: No respiratory distress.     Breath sounds: No wheezing.  Abdominal:     General: There is no distension.     Tenderness: There is no abdominal tenderness. There is no guarding or rebound.  Musculoskeletal: Normal range of motion.  Skin:    Coloration: Skin is not pale.     Findings: No rash.  Neurological:     Mental Status: He is alert and oriented to person, place, and time.  Psychiatric:        Behavior: Behavior normal.      ED Treatments / Results  Labs (all labs ordered are listed, but only abnormal results are displayed) Labs Reviewed  CBC WITH DIFFERENTIAL/PLATELET - Abnormal; Notable for the following components:      Result Value   RBC 4.20 (*)    All other components within normal limits  COMPREHENSIVE METABOLIC PANEL - Abnormal; Notable for the following components:   Potassium 2.7 (*)    CO2 18 (*)    Glucose, Bld 127 (*)    Calcium 7.9 (*)    Total Protein 5.6 (*)    Albumin 3.4 (*)    Total Bilirubin 2.1 (*)    All other components within normal limits    EKG EKG Interpretation  Date/Time:  Wednesday June 12 2019 19:23:31 EDT Ventricular Rate:  90 PR Interval:    QRS Duration: 88 QT Interval:  371 QTC Calculation: 454 R Axis:   63 Text Interpretation:  Sinus rhythm No significant change since last tracing Confirmed by Melene PlanFloyd, Duong Haydel [phone]) on 06/12/2019 8:06:22 PM   Radiology Ct Head Wo Contrast  Result  Date: 06/12/2019 CLINICAL DATA:  Altered level of consciousness. Unresponsive. EXAM: CT HEAD WITHOUT CONTRAST TECHNIQUE: Contiguous axial images were obtained from the base of the skull through the vertex without intravenous contrast. COMPARISON:  Head CT 07/29/2012 FINDINGS: Brain: No intracranial hemorrhage, mass effect, or midline shift. No hydrocephalus. The basilar cisterns are patent. No evidence of territorial infarct or acute ischemia. No extra-axial or intracranial fluid collection. Vascular: No hyperdense vessel. Skull: No fracture or focal lesion. Sinuses/Orbits:  Paranasal sinuses and mastoid air cells are clear. The visualized orbits are unremarkable. Other: None. IMPRESSION: Unremarkable noncontrast head CT. Electronically Signed   By: Narda Rutherford M.D.   On: 06/12/2019 21:29    Procedures Procedures (including critical care time)  Medications Ordered in ED Medications  potassium chloride 10 mEq in 100 mL IVPB (10 mEq Intravenous New Bag/Given 06/12/19 2227)  sodium chloride 0.9 % bolus 1,000 mL (0 mLs Intravenous Stopped 06/12/19 2106)  naloxone [facility]) injection 0.4 mg (0.4 mg Intravenous Given 06/12/19 1923)  haloperidol lactate (HALDOL) injection 5 mg (5 mg Intravenous Given 06/12/19 2000)  magnesium sulfate IVPB 2 g 50 mL (0 g Intravenous Stopped 06/12/19 2226)  potassium chloride SA (K-DUR) CR tablet 40 mEq (40 mEq Oral Given 06/12/19 2243)     Initial Impression / Assessment and Plan / ED Course  I have reviewed the triage vital signs and the nursing notes.  Pertinent labs & imaging results that were available during my care of the patient were reviewed by me and considered in my medical decision making (see chart for details).        28 yo M with a chief complaints of altered mental status.  This appears to be primarily psychogenic.  The patient has never been hypoglycemic.  The patient has had 2 seizure-like events since he has been here that I personally witnessed.  Both were  broken with ammonia or painful stimuli.  Patient is immediately back to his baseline afterwards.  He has had some nausea and vomiting so we will give the patient a dose of an antiemetic. Obtain lab work.  Reassesses.  The patient's potassium was 2.7.  No significant EKG changes no Q waves no prolonged QT.  We will give some potassium and magnesium here.  CT scan of the head is negative for acute intracranial pathology.  After nausea medicine the patient's seizure-like activity has ceased.  Again I feel this is unlikely to be true epilepsy.  Will refer him to outpatient neurology.  11:18 PM:  I have discussed the diagnosis/risks/treatment options with the patient and believe the pt to be eligible for discharge home to follow-up with PCP, neuro, gi. We also discussed returning to the ED immediately if new or worsening sx occur. We discussed the sx which are most concerning (e.g., sudden worsening pain, fever, inability to tolerate by mouth) that necessitate immediate return. Medications administered to the patient during their visit and any new prescriptions provided to the patient are listed below.  Medications given during this visit Medications  potassium chloride 10 mEq in 100 mL IVPB (10 mEq Intravenous New Bag/Given 06/12/19 2227)  sodium chloride 0.9 % bolus 1,000 mL (0 mLs Intravenous Stopped 06/12/19 2106)  naloxone [facility]) injection 0.4 mg (0.4 mg Intravenous Given 06/12/19 1923)  haloperidol lactate (HALDOL) injection 5 mg (5 mg Intravenous Given 06/12/19 2000)  magnesium sulfate IVPB 2 g 50 mL (0 g Intravenous Stopped 06/12/19 2226)  potassium chloride SA (K-DUR) CR tablet 40 mEq (40 mEq Oral Given 06/12/19 2243)     The patient appears reasonably screen and/or stabilized for discharge and I doubt any other medical condition or other [facility] requiring further screening, evaluation, or treatment in the ED at this time prior to discharge.   Final Clinical Impressions(s) / ED Diagnoses   Final diagnoses:   Hypokalemia  Spells of trembling    ED Discharge Orders         Ordered    Ambulatory referral to Neurology  Comments: Seizure like activity   06/12/19 Rogers, [place], DO 06/12/19 2318

## 2019-06-12 NOTE — ED Notes (Signed)
Patient having seizure-like activity; Md notified and at bedside.

## 2019-06-12 NOTE — ED Notes (Addendum)
Patient having a seizure-like activity; Md notified.

## 2019-06-12 NOTE — ED Triage Notes (Signed)
Per EMS, they received a call that pt was unresponsive. Upon arrival, his blood sugar was 77, so EMS gave 250 D10, 25 grams of oral glucose. Per EMS blood sugars were going up, then pt started to have a seizure. Pt then given 2.5 IVP mg of versed. Upon arrival, pt is unresponsive to pain/voice.

## 2019-06-18 ENCOUNTER — Encounter: Payer: Self-pay | Admitting: Family Medicine

## 2019-06-18 ENCOUNTER — Ambulatory Visit: Payer: BC Managed Care – PPO | Admitting: Family Medicine

## 2019-06-18 VITALS — BP 126/70 | HR 98 | Temp 98.8°F | Ht 68.0 in | Wt 170.8 lb

## 2019-06-18 DIAGNOSIS — Z23 Encounter for immunization: Secondary | ICD-10-CM

## 2019-06-18 DIAGNOSIS — Z114 Encounter for screening for human immunodeficiency virus [HIV]: Secondary | ICD-10-CM

## 2019-06-18 DIAGNOSIS — E876 Hypokalemia: Secondary | ICD-10-CM | POA: Diagnosis not present

## 2019-06-18 DIAGNOSIS — F331 Major depressive disorder, recurrent, moderate: Secondary | ICD-10-CM | POA: Diagnosis not present

## 2019-06-18 DIAGNOSIS — R197 Diarrhea, unspecified: Secondary | ICD-10-CM | POA: Diagnosis not present

## 2019-06-18 DIAGNOSIS — R569 Unspecified convulsions: Secondary | ICD-10-CM

## 2019-06-18 DIAGNOSIS — F909 Attention-deficit hyperactivity disorder, unspecified type: Secondary | ICD-10-CM | POA: Diagnosis not present

## 2019-06-18 DIAGNOSIS — IMO0002 Reserved for concepts with insufficient information to code with codable children: Secondary | ICD-10-CM

## 2019-06-18 DIAGNOSIS — G43709 Chronic migraine without aura, not intractable, without status migrainosus: Secondary | ICD-10-CM

## 2019-06-18 DIAGNOSIS — F1011 Alcohol abuse, in remission: Secondary | ICD-10-CM

## 2019-06-18 LAB — CBC WITH DIFFERENTIAL/PLATELET
Basophils Absolute: 0.1 10*3/uL (ref 0.0–0.1)
Basophils Relative: 0.7 % (ref 0.0–3.0)
Eosinophils Absolute: 0 10*3/uL (ref 0.0–0.7)
Eosinophils Relative: 0.4 % (ref 0.0–5.0)
HCT: 42.3 % (ref 39.0–52.0)
Hemoglobin: 14.3 g/dL (ref 13.0–17.0)
Lymphocytes Relative: 9.3 % — ABNORMAL LOW (ref 12.0–46.0)
Lymphs Abs: 0.9 10*3/uL (ref 0.7–4.0)
MCHC: 33.9 g/dL (ref 30.0–36.0)
MCV: 95 fl (ref 78.0–100.0)
Monocytes Absolute: 1.1 10*3/uL — ABNORMAL HIGH (ref 0.1–1.0)
Monocytes Relative: 11.5 % (ref 3.0–12.0)
Neutro Abs: 7.8 10*3/uL — ABNORMAL HIGH (ref 1.4–7.7)
Neutrophils Relative %: 78.1 % — ABNORMAL HIGH (ref 43.0–77.0)
Platelets: 221 10*3/uL (ref 150.0–400.0)
RBC: 4.46 Mil/uL (ref 4.22–5.81)
RDW: 14.6 % (ref 11.5–15.5)
WBC: 9.9 10*3/uL (ref 4.0–10.5)

## 2019-06-18 LAB — COMPREHENSIVE METABOLIC PANEL
ALT: 22 U/L (ref 0–53)
AST: 20 U/L (ref 0–37)
Albumin: 4.5 g/dL (ref 3.5–5.2)
Alkaline Phosphatase: 49 U/L (ref 39–117)
BUN: 12 mg/dL (ref 6–23)
CO2: 30 mEq/L (ref 19–32)
Calcium: 9.4 mg/dL (ref 8.4–10.5)
Chloride: 99 mEq/L (ref 96–112)
Creatinine, Ser: 0.78 mg/dL (ref 0.40–1.50)
GFR: 118.98 mL/min (ref >60.00)
Glucose, Bld: 78 mg/dL (ref 70–99)
Potassium: 3.9 mEq/L (ref 3.5–5.1)
Sodium: 138 mEq/L (ref 135–145)
Total Bilirubin: 0.7 mg/dL (ref 0.2–1.2)
Total Protein: 7.2 g/dL (ref 6.0–8.3)

## 2019-06-18 LAB — TSH: TSH: 1.47 u[IU]/mL (ref 0.35–4.50)

## 2019-06-18 LAB — LIPASE: Lipase: 7 U/L — ABNORMAL LOW (ref 11.0–59.0)

## 2019-06-18 NOTE — Patient Instructions (Signed)
Great to meet you! I have entered referrals as we discussed We'll be in touch with lab results.  Please call of message through [place] with any questions.

## 2019-06-18 NOTE — Progress Notes (Signed)
Brett GaribaldiSeth P Sanford - 28 y.o. male MRN [id number]  Date of birth: 25-Dec-1990  Subjective Chief Complaint  Patient presents with  . New Patient (Initial Visit)    Pt is here today to establish care. Would like to get flu shot. Requesting a referral to Psychiatry. Agrees to HIV lab draw.  Brett Sanford. Hospitalization Follow-up    Pt went to hosptial on Wednesday night. Episode of hypoglycemia and ended up having syncopal episode and again waiting for EMS.    HPI Brett Sanford is a 28 y.o. male with history of alcohol and polysubstance abuse, MDD, and ADD here today for initial visit and f/u of recent ED visit.  He was recently seen in the ED for altered mental status.  Thought to be related to hypoglycemia as he had initial improvement with bolus of D50 by EMS.  Had recurrent AMS and given oral glucose without improvement.  He was noted to have seizure like activity as well.  It was not felt that he had true epilepsy but recommended outpatient referral to neurology.  He does report history of chronic migraines since childhood.   He has also had symptoms of diarrhea for several weeks.  Had GI panel at urgent care that was negative for pathogenic cause.  He was noted to have hypokalemia in the ED of 2.7.  This was repleted.  His current medications include vyvanse, fluoxetine, and sonata.  He is prescribed naltrexone but is not taking this currently.  He reports that he is 3 months sober from EtOH and is followed at [facility].  He would [facility].  He denies opioid use.  He does continue to use marijuana on occasion to help with anxiety and appetite as this is poor due to vyvanse use.    ROS:  A comprehensive ROS was completed and negative except as noted per HPI  Allergies  Allergen Reactions  . Benadryl [Diphenhydramine Hcl] Other (See Comments)    My pt hyper    Past Medical History:  Diagnosis Date  . ADD (attention deficit disorder)   . Drug abuse (HCC)   .  Medical history non-contributory     Past Surgical History:  Procedure Laterality Date  . NO PAST SURGERIES      Social History   Socioeconomic History  . Marital status: Single    Spouse name: Not on file  . Number of children: Not on file  . Years of education: Not on file  . Highest education level: Not on file  Occupational History  . Not on file  Social Needs  . Financial resource strain: Not on file  . Food insecurity    Worry: Not on file    Inability: Not on file  . Transportation needs    Medical: Not on file    Non-medical: Not on file  Tobacco Use  . Smoking status: Current Every Day Smoker  . Smokeless tobacco: Never Used  Substance and Sexual Activity  . Alcohol use: Yes  . Drug use: Yes    Types: Marijuana  . Sexual activity: Not on file  Lifestyle  . Physical activity    Days per week: Not on file    Minutes per session: Not on file  . Stress: Not on file  Relationships  . Social Musicianconnections    Talks on phone: Not on file    Gets together: Not on file    Attends religious service: Not on file  Active member of club or organization: Not on file    Attends meetings of clubs or organizations: Not on file    Relationship status: Not on file  Other Topics Concern  . Not on file  Social History Narrative  . Not on file    Family History  Family history unknown: Yes    Health Maintenance  Topic Date Due  . HIV Screening  12/28/2006  . TETANUS/TDAP  12/28/2010  . INFLUENZA VACCINE  05/11/2019    ----------------------------------------------------------------------------------------------------------------------------------------------------------------------------------------------------------------- Physical Exam BP 126/70 (BP Location: Left Arm, Patient Position: Sitting, Cuff Size: Normal)   Pulse 98   Temp 98.8 F (37.1 C) (Oral)   Ht 5\' 8"  (1.727 m)   Wt 170 lb 12.8 oz (77.5 kg)   SpO2 98%   BMI 25.97 kg/m   Physical Exam  Constitutional:      Appearance: Normal appearance.  HENT:     Head: Normocephalic and atraumatic.     Mouth/Throat:     Mouth: Mucous membranes are moist.  Eyes:     General: No scleral icterus. Neck:     Musculoskeletal: Neck supple.  Cardiovascular:     Rate and Rhythm: Normal rate and regular rhythm.  Pulmonary:     Effort: Pulmonary effort is normal.     Breath sounds: Normal breath sounds.  Abdominal:     General: Abdomen is flat. There is no distension.     Palpations: Abdomen is soft.     Tenderness: There is no abdominal tenderness.  Skin:    General: Skin is warm and dry.  Neurological:     General: No focal deficit present.     Mental Status: He is alert.  Psychiatric:        Mood and Affect: Mood normal.        Behavior: Behavior normal.     ------------------------------------------------------------------------------------------------------------------------------------------------------------------------------------------------------------------- Assessment and Plan  Diarrhea -A recent GI panel was negative for infectious cause.  -?chronic pancreatitis with history of prior EtOH use.  -Check HIV antibody -I don't think any medications are contributing at this time.  -Will place referral to GI for further evaluation of this. Recheck postassium due to recent hypokalemia.   History of alcohol abuse -Reports sobriety x3 months.  -Attending [place] meetings.  -[facility] will [facility] however would like to see psychiatry as well.  -Recommend that he continue naltrexone to help with cravings to prevent relapse.    Seizure-like activity ([place]) -Referral  to neuro for this and recurrent migraines.   Attention deficit hyperactivity disorder (ADHD) -Referral entered for psychiatry.

## 2019-06-19 LAB — HIV ANTIBODY (ROUTINE TESTING W REFLEX): HIV 1&2 Ab, 4th Generation: NONREACTIVE

## 2019-06-23 ENCOUNTER — Encounter: Payer: Self-pay | Admitting: Family Medicine

## 2019-06-23 DIAGNOSIS — R197 Diarrhea, unspecified: Secondary | ICD-10-CM | POA: Insufficient documentation

## 2019-06-23 DIAGNOSIS — F1011 Alcohol abuse, in remission: Secondary | ICD-10-CM | POA: Insufficient documentation

## 2019-06-23 DIAGNOSIS — F909 Attention-deficit hyperactivity disorder, unspecified type: Secondary | ICD-10-CM | POA: Insufficient documentation

## 2019-06-23 DIAGNOSIS — G43709 Chronic migraine without aura, not intractable, without status migrainosus: Secondary | ICD-10-CM | POA: Insufficient documentation

## 2019-06-23 DIAGNOSIS — R569 Unspecified convulsions: Secondary | ICD-10-CM | POA: Insufficient documentation

## 2019-06-23 DIAGNOSIS — IMO0002 Reserved for concepts with insufficient information to code with codable children: Secondary | ICD-10-CM | POA: Insufficient documentation

## 2019-06-23 DIAGNOSIS — E876 Hypokalemia: Secondary | ICD-10-CM | POA: Insufficient documentation

## 2019-06-23 NOTE — Assessment & Plan Note (Addendum)
-  A recent GI panel was negative for infectious cause.  -? Possibility of chronic pancreatitis with history of prior heavy EtOH use.  -Check HIV antibody -I don't think any medications are contributing at this time.  -Will place referral to GI for further evaluation of this. Recheck postassium due to recent hypokalemia.

## 2019-06-23 NOTE — Assessment & Plan Note (Signed)
-  Referral  to neuro for this and recurrent migraines.

## 2019-06-23 NOTE — Assessment & Plan Note (Signed)
-  Referral entered for psychiatry.

## 2019-06-23 NOTE — Assessment & Plan Note (Signed)
-  Reports sobriety x3 months.  -Attending [place] meetings.  -He will continue with [facility] however would like to see psychiatry as well.  -Recommend that he continue naltrexone to help with cravings to prevent relapse.

## 2019-07-08 ENCOUNTER — Telehealth (HOSPITAL_COMMUNITY): Payer: Self-pay | Admitting: Professional

## 2019-07-23 ENCOUNTER — Encounter: Payer: Self-pay | Admitting: Family Medicine

## 2019-07-31 ENCOUNTER — Ambulatory Visit: Payer: BC Managed Care – PPO | Admitting: Neurology

## 2019-08-21 ENCOUNTER — Encounter: Payer: Self-pay | Admitting: Family Medicine

## 2019-08-21 ENCOUNTER — Other Ambulatory Visit: Payer: Self-pay

## 2019-08-21 ENCOUNTER — Emergency Department (HOSPITAL_COMMUNITY)
Admission: EM | Admit: 2019-08-21 | Discharge: 2019-08-22 | Disposition: A | Discharge status: Home / Self Care | Payer: BC Managed Care – PPO | Attending: Emergency Medicine | Admitting: Emergency Medicine

## 2019-08-21 ENCOUNTER — Ambulatory Visit (INDEPENDENT_AMBULATORY_CARE_PROVIDER_SITE_OTHER): Payer: BC Managed Care – PPO | Admitting: Family Medicine

## 2019-08-21 VITALS — Temp 98.7°F | Ht 68.0 in

## 2019-08-21 DIAGNOSIS — Z20822 Contact with and (suspected) exposure to covid-19: Secondary | ICD-10-CM

## 2019-08-21 DIAGNOSIS — Z79899 Other long term (current) drug therapy: Secondary | ICD-10-CM | POA: Insufficient documentation

## 2019-08-21 DIAGNOSIS — F909 Attention-deficit hyperactivity disorder, unspecified type: Secondary | ICD-10-CM | POA: Insufficient documentation

## 2019-08-21 DIAGNOSIS — Z20828 Contact with and (suspected) exposure to other viral communicable diseases: Secondary | ICD-10-CM

## 2019-08-21 DIAGNOSIS — F17200 Nicotine dependence, unspecified, uncomplicated: Secondary | ICD-10-CM | POA: Diagnosis not present

## 2019-08-21 DIAGNOSIS — R112 Nausea with vomiting, unspecified: Secondary | ICD-10-CM | POA: Diagnosis not present

## 2019-08-21 DIAGNOSIS — E860 Dehydration: Secondary | ICD-10-CM | POA: Diagnosis not present

## 2019-08-21 DIAGNOSIS — B349 Viral infection, unspecified: Secondary | ICD-10-CM | POA: Diagnosis not present

## 2019-08-21 DIAGNOSIS — R109 Unspecified abdominal pain: Secondary | ICD-10-CM | POA: Diagnosis present

## 2019-08-21 MED ORDER — ONDANSETRON HCL 4 MG PO TABS: 4.0000 mg | ORAL_TABLET | Freq: Three times a day (TID) | ORAL | 0 refills | Status: DC | PRN

## 2019-08-21 NOTE — Progress Notes (Signed)
Brett Sanford - 28 y.o. male MRN [id number]  Date of birth: 05-01-1991   This visit type was conducted due to national recommendations for restrictions regarding the COVID-19 Pandemic (e.g. social distancing).  This format is felt to be most appropriate for this patient at this time.  All issues noted in this document were discussed and addressed.  No physical exam was performed (except for noted visual exam findings with Video Visits).  I discussed the limitations of evaluation and management by telemedicine and the availability of in person appointments. The patient expressed understanding and agreed to proceed.  I connected with@ on 08/21/19 at  2:00 PM EST by a video enabled telemedicine application and verified that I am speaking with the correct person using two identifiers.  Present for video visit: Brett Sanford   Patient Location: Home [address] [place] [address]   Provider location:   [facility]  Chief Complaint  Patient presents with  . Abdominal Pain    pt said he had diarrhea vomitting abdominal pain and a low grade fever of 100 two days ago and today muscle aches, vomitting and abdominal pain.    HPI  Brett Sanford is a 28 y.o. male who presents via audio/video conferencing for a telehealth visit today.  He has complaint of vomiting with diarrhea and lower abdominal pain.  Reports that he was found unresponsive by his mother yesterday.  EMS called and came to with ammonia inhalant.  He was checked out and BP, HR and blood sugar were all ok.  He did have elevated temp at 100.0.  He does not recall events leading up to him passing out.  There was no witnessed seizure like activity.  He had been referred to neuro previously but unfortunately he missed in his appt.  He plans to reschedule.  In regards to his diarrhea he has not seen blood or dark stools.  He denies respiratory symptoms.    ROS:  A comprehensive ROS was completed and  negative except as noted per HPI     Past Medical History:  Diagnosis Date  . ADD (attention deficit disorder)   . Drug abuse (HCC)   . Medical history non-contributory     Past Surgical History:  Procedure Laterality Date  . NO PAST SURGERIES      Family History  Family history unknown: Yes    Social History   Socioeconomic History  . Marital status: Single    Spouse name: Not on file  . Number of children: Not on file  . Years of education: Not on file  . Highest education level: Not on file  Occupational History  . Not on file  Social Needs  . Financial resource strain: Not on file  . Food insecurity    Worry: Not on file    Inability: Not on file  . Transportation needs    Medical: Not on file    Non-medical: Not on file  Tobacco Use  . Smoking status: Current Every Day Smoker  . Smokeless tobacco: Never Used  Substance and Sexual Activity  . Alcohol use: Yes  . Drug use: Yes    Types: Marijuana  . Sexual activity: Not on file  Lifestyle  . Physical activity    Days per week: Not on file    Minutes per session: Not on file  . Stress: Not on file  Relationships  . Social Musician on phone: Not  on file    Gets together: Not on file    Attends religious service: Not on file    Active member of club or organization: Not on file    Attends meetings of clubs or organizations: Not on file    Relationship status: Not on file  . Intimate partner violence    Fear of current or ex partner: Not on file    Emotionally abused: Not on file    Physically abused: Not on file    Forced sexual activity: Not on file  Other Topics Concern  . Not on file  Social History Narrative  . Not on file     Current Outpatient Medications:  .  FLUoxetine (PROZAC) 40 MG capsule, Take 40 mg by mouth daily. , Disp: , Rfl:  .  clonazePAM (KLONOPIN) 1 MG tablet, Take 1 mg by mouth 2 (two) times daily as needed., Disp: , Rfl:  .  esomeprazole (NEXIUM) 20 MG  capsule, Take 1 capsule (20 mg total) by mouth daily before breakfast. (Patient not taking: Reported on 08/21/2019), Disp: 30 capsule, Rfl: 0 .  naltrexone (DEPADE) 50 MG tablet, Take 50 mg by mouth daily., Disp: , Rfl:  .  ondansetron (ZOFRAN) 4 MG tablet, Take 1 tablet (4 mg total) by mouth every 8 (eight) hours as needed for nausea or vomiting., Disp: 20 tablet, Rfl: 0 .  VYVANSE 30 MG capsule, Take 30 mg by mouth daily. , Disp: , Rfl:  .  zaleplon (SONATA) 10 MG capsule, Take 10 mg by mouth at bedtime. , Disp: , Rfl:   EXAM:  VITALS per patient if applicable: Temp 45.8 F (37.1 C) (Temporal) Comment: per pt. Comment (Src): per pt.  Ht 5\' 8"  (1.727 m)   BMI 25.97 kg/m   GENERAL: alert, oriented, appears well and in no acute distress  HEENT: atraumatic, conjunttiva clear, no obvious abnormalities on inspection of external nose and ears  NECK: normal movements of the head and neck  LUNGS: on inspection no signs of respiratory distress, breathing rate appears normal, no obvious gross SOB, gasping or wheezing  CV: no obvious cyanosis  MS: moves all visible extremities without noticeable abnormality  PSYCH/NEURO: pleasant and cooperative, no obvious depression or anxiety, speech and thought processing grossly intact  ASSESSMENT AND PLAN:  Discussed the following assessment and plan:  Viral illness -Viral gastroenteritis vs COVID.  COVID testing ordered.  -No further syncope, denies shortness of breath or chest pain.  I think causes such as PE are unlikely.  Recommend he stay well hydrated.  -Rx for zofran for nausea.  -Discussed that he should seek hospital care if having worsening symptoms or is feeling like he may pass out again.        I discussed the assessment and treatment plan with the patient. The patient was provided an opportunity to ask questions and all were answered. The patient agreed with the plan and demonstrated an understanding of the instructions.   The  patient was advised to call back or seek an in-person evaluation if the symptoms worsen or if the condition fails to improve as anticipated.   Luetta Nutting, DO

## 2019-08-21 NOTE — Assessment & Plan Note (Signed)
-  Viral gastroenteritis vs COVID.  COVID testing ordered.  -No further syncope, denies shortness of breath or chest pain.  I think causes such as PE are unlikely.  Recommend he stay well hydrated.  -Rx for zofran for nausea.  -Discussed that he should seek hospital care if having worsening symptoms or is feeling like he may pass out again.

## 2019-08-22 ENCOUNTER — Encounter (HOSPITAL_COMMUNITY): Payer: Self-pay | Admitting: Emergency Medicine

## 2019-08-22 ENCOUNTER — Emergency Department (HOSPITAL_COMMUNITY): Payer: BC Managed Care – PPO

## 2019-08-22 LAB — URINALYSIS, ROUTINE W REFLEX MICROSCOPIC
Bacteria, UA: NONE SEEN
Bilirubin Urine: NEGATIVE
Glucose, UA: NEGATIVE mg/dL
Hgb urine dipstick: NEGATIVE
Ketones, ur: 20 mg/dL — AB
Leukocytes,Ua: NEGATIVE
Nitrite: NEGATIVE
Protein, ur: 30 mg/dL — AB
Specific Gravity, Urine: 1.033 — ABNORMAL HIGH (ref 1.005–1.030)
pH: 6 (ref 5.0–8.0)

## 2019-08-22 LAB — COMPREHENSIVE METABOLIC PANEL
ALT: 38 U/L (ref 0–44)
AST: 56 U/L — ABNORMAL HIGH (ref 15–41)
Albumin: 4.3 g/dL (ref 3.5–5.0)
Alkaline Phosphatase: 62 U/L (ref 38–126)
Anion gap: 17 — ABNORMAL HIGH (ref 5–15)
BUN: 10 mg/dL (ref 6–20)
CO2: 25 mmol/L (ref 22–32)
Calcium: 9.2 mg/dL (ref 8.9–10.3)
Chloride: 96 mmol/L — ABNORMAL LOW (ref 98–111)
Creatinine, Ser: 1 mg/dL (ref 0.61–1.24)
GFR calc Af Amer: >60 mL/min (ref >60)
GFR calc non Af Amer: >60 mL/min (ref >60)
Glucose, Bld: 106 mg/dL — ABNORMAL HIGH (ref 70–99)
Potassium: 3.4 mmol/L — ABNORMAL LOW (ref 3.5–5.1)
Sodium: 138 mmol/L (ref 135–145)
Total Bilirubin: 1.6 mg/dL — ABNORMAL HIGH (ref 0.3–1.2)
Total Protein: 7 g/dL (ref 6.5–8.1)

## 2019-08-22 LAB — LACTIC ACID, PLASMA
Lactic Acid, Venous: 3.2 mmol/L (ref 0.5–1.9)
Lactic Acid, Venous: 2.7 mmol/L (ref 0.5–1.9)
Lactic Acid, Venous: 0.8 mmol/L (ref 0.5–1.9)

## 2019-08-22 LAB — CBC
HCT: 44.3 % (ref 39.0–52.0)
Hemoglobin: 15.2 g/dL (ref 13.0–17.0)
MCH: 30.7 pg (ref 26.0–34.0)
MCHC: 34.3 g/dL (ref 30.0–36.0)
MCV: 89.5 fL (ref 80.0–100.0)
Platelets: 283 10*3/uL (ref 150–400)
RBC: 4.95 MIL/uL (ref 4.22–5.81)
RDW: 12.3 % (ref 11.5–15.5)
WBC: 7.4 10*3/uL (ref 4.0–10.5)
nRBC: 0 % (ref 0.0–0.2)

## 2019-08-22 LAB — LIPASE, BLOOD: Lipase: 27 U/L (ref 11–51)

## 2019-08-22 MED ORDER — SODIUM CHLORIDE 0.9 % IV BOLUS
1000.0000 mL | Freq: Once | INTRAVENOUS | Status: AC
  Administered 2019-08-22: 1000 mL via INTRAVENOUS

## 2019-08-22 MED ORDER — DICYCLOMINE HCL 20 MG PO TABS: 20.0000 mg | ORAL_TABLET | Freq: Two times a day (BID) | ORAL | 0 refills | Status: DC | PRN

## 2019-08-22 MED ORDER — SODIUM CHLORIDE 0.9% FLUSH
3.0000 mL | Freq: Once | INTRAVENOUS | Status: AC
  Administered 2019-08-22: 3 mL via INTRAVENOUS

## 2019-08-22 MED ORDER — ONDANSETRON HCL 4 MG/2ML IJ SOLN
4.0000 mg | Freq: Once | INTRAMUSCULAR | Status: AC | PRN
  Administered 2019-08-22: 4 mg via INTRAVENOUS
  Filled 2019-08-22: qty 2

## 2019-08-22 MED ORDER — FAMOTIDINE IN NACL 20-0.9 MG/50ML-% IV SOLN
20.0000 mg | Freq: Once | INTRAVENOUS | Status: AC
  Administered 2019-08-22: 20 mg via INTRAVENOUS
  Filled 2019-08-22: qty 50

## 2019-08-22 MED ORDER — MORPHINE SULFATE (PF) 4 MG/ML IV SOLN
4.0000 mg | Freq: Once | INTRAVENOUS | Status: AC
  Administered 2019-08-22: 01:00:00 4 mg via INTRAVENOUS
  Filled 2019-08-22: qty 1

## 2019-08-22 MED ORDER — IOHEXOL 300 MG/ML  SOLN
100.0000 mL | Freq: Once | INTRAMUSCULAR | Status: AC | PRN
  Administered 2019-08-22: 100 mL via INTRAVENOUS

## 2019-08-22 MED ORDER — DICYCLOMINE HCL 10 MG PO CAPS
10.0000 mg | ORAL_CAPSULE | Freq: Once | ORAL | Status: AC
  Administered 2019-08-22: 10 mg via ORAL
  Filled 2019-08-22: qty 1

## 2019-08-22 NOTE — ED Provider Notes (Signed)
[facility] EMERGENCY [facility]PARTMENT Provider Note   CSN: [id number] Arrival date & time: 08/21/19  2342     History   Chief Complaint Chief Complaint  Patient presents with  . Abdominal Pain  . Dehydration    HPI Brett Sanford is a 28 y.o. 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  He has not been able to keep anything down over the past several days.  He reports 2 days ago he had an episode of being unresponsive and his mother reports that he had rapid breathing and acting like he was short of breath.  He was noted to have fever up to 100F at that time.  Since then he has not had any shortness of breath, cough, chest pain, fever.  Patient reports he has had episodes of nausea, vomiting, diarrhea, abdominal pain like this in the past and this feels similar.  He reports he has had decreased urination.  He was seen yesterday at his PCP and tested for Covid-19, pending results.     HPI  Past Medical History:  Diagnosis Date  . ADD (attention deficit disorder)   . Drug abuse (HCC)   . Medical history non-contributory     Patient Active Problem List   Diagnosis Date Noted  . Viral illness 08/21/2019  . Hypokalemia 06/23/2019  . Attention deficit hyperactivity disorder (ADHD) 06/23/2019  . Seizure-like activity (HCC) 06/23/2019  . History of alcohol abuse 06/23/2019  . Chronic migraine 06/23/2019  . Diarrhea 06/23/2019  . MDD (major depressive disorder) 03/28/2019    Past Surgical History:  Procedure Laterality Date  . NO PAST SURGERIES          Home Medications    Prior to Admission medications   Medication Sig Start Date End Date Taking? Authorizing Provider  clonazePAM (KLONOPIN) 1 MG tablet Take 1 mg by mouth 2 (two) times daily as needed. 06/03/19   [provider]  esomeprazole (NEXIUM) 20 MG capsule Take 1 capsule (20 mg total) by mouth  daily before breakfast. Patient not taking: Reported on 08/21/2019 05/10/19   Wallis BambergMani, Mario, PA-C  FLUoxetine (PROZAC) 40 MG capsule Take 40 mg by mouth daily.  05/03/19   [provider]  naltrexone (DEPADE) 50 MG tablet Take 50 mg by mouth daily.    [provider]  ondansetron (ZOFRAN) 4 MG tablet Take 1 tablet (4 mg total) by mouth every 8 (eight) hours as needed for nausea or vomiting. 08/21/19   Everrett CoombeMatthews, Cody, DO  VYVANSE 30 MG capsule Take 30 mg by mouth daily.  05/02/19   [provider]  zaleplon (SONATA) 10 MG capsule Take 10 mg by mouth at bedtime.  05/03/19   [provider]    Family History Family History  Family history unknown: Yes    Social History Social History   Tobacco Use  . Smoking status: Current Every Day Smoker  . Smokeless tobacco: Never Used  Substance Use Topics  . Alcohol use: Yes    Comment: hx of alcoholism  . Drug use: Yes    Types: Marijuana     Allergies   Benadryl [diphenhydramine hcl]   Review of Systems Review of Systems  Constitutional: Positive for fever. Negative for chills.  HENT: Negative for facial swelling and sore throat.   Respiratory: Positive for shortness of breath (one episode during syncopal episode). Negative for cough.   Cardiovascular:  Negative for chest pain.  Gastrointestinal: Positive for abdominal pain, diarrhea, nausea and vomiting. Negative for blood in stool.  Genitourinary: Negative for dysuria.  Musculoskeletal: Positive for myalgias. Negative for back pain.  Skin: Negative for rash and wound.  Neurological: Positive for syncope. Negative for headaches.  Psychiatric/Behavioral: The patient is not nervous/anxious.      Physical Exam Updated Vital Signs BP 137/74 (BP Location: Left Arm)   Pulse 78   Temp 98.7 F (37.1 C) (Oral)   Resp 17   Ht 5\' 8"  (1.727 m)   Wt 77.1 kg   SpO2 96%   BMI 25.85 kg/m   Physical Exam Vitals signs and nursing note reviewed.   Constitutional:      General: He is not in acute distress.    Appearance: He is well-developed. He is not diaphoretic.  HENT:     Head: Normocephalic and atraumatic.     Mouth/Throat:     Pharynx: No oropharyngeal exudate.  Eyes:     General: No scleral icterus.       Right eye: No discharge.        Left eye: No discharge.     Conjunctiva/sclera: Conjunctivae normal.     Pupils: Pupils are equal, round, and reactive to light.  Neck:     Musculoskeletal: Normal range of motion and neck supple.     Thyroid: No thyromegaly.  Cardiovascular:     Rate and Rhythm: Normal rate and regular rhythm.     Heart sounds: Normal heart sounds. No murmur. No friction rub. No gallop.   Pulmonary:     Effort: Pulmonary effort is normal. No respiratory distress.     Breath sounds: Normal breath sounds. No stridor. No wheezing or rales.  Abdominal:     General: Bowel sounds are normal. There is no distension.     Palpations: Abdomen is soft.     Tenderness: There is abdominal tenderness in the right lower quadrant and periumbilical area. There is no right CVA tenderness, left CVA tenderness, guarding or rebound. Positive signs include McBurney's sign.  Lymphadenopathy:     Cervical: No cervical adenopathy.  Skin:    General: Skin is warm and dry.     Coloration: Skin is not pale.     Findings: No rash.  Neurological:     Mental Status: He is alert.     Coordination: Coordination normal.      ED Treatments / Results  Labs (all labs ordered are listed, but only abnormal results are displayed) Labs Reviewed  COMPREHENSIVE METABOLIC PANEL - Abnormal; Notable for the following components:      Result Value   Potassium 3.4 (*)    Chloride 96 (*)    Glucose, Bld 106 (*)    AST 56 (*)    Total Bilirubin 1.6 (*)    Anion gap 17 (*)    All other components within normal limits  URINALYSIS, ROUTINE W REFLEX MICROSCOPIC - Abnormal; Notable for the following components:   Color, Urine AMBER (*)     APPearance HAZY (*)    Specific Gravity, Urine 1.033 (*)    Ketones, ur 20 (*)    Protein, ur 30 (*)    All other components within normal limits  LACTIC ACID, PLASMA - Abnormal; Notable for the following components:   Lactic Acid, Venous 3.2 (*)    All other components within normal limits  LACTIC ACID, PLASMA - Abnormal; Notable for the following components:   Lactic Acid, Venous  2.7 (*)    All other components within normal limits  LIPASE, BLOOD  CBC    EKG None  Radiology Ct Abdomen Pelvis W Contrast  Result Date: 08/22/2019 CLINICAL DATA:  Nausea vomiting abdominal pain, right lower quadrant EXAM: CT ABDOMEN AND PELVIS WITH CONTRAST TECHNIQUE: Multidetector CT imaging of the abdomen and pelvis was performed using the standard protocol following bolus administration of intravenous contrast. CONTRAST:  OMNIPAQUE IOHEXOL 300 MG/ML  SOLN COMPARISON:  None. FINDINGS: Lower chest: The visualized heart size within normal limits. No pericardial fluid/thickening. No hiatal hernia. The visualized portions of the lungs are clear. Hepatobiliary: The liver is normal in density without focal abnormality.The main portal vein is patent. No evidence of calcified gallstones, gallbladder wall thickening or biliary dilatation. Pancreas: Unremarkable. No pancreatic ductal dilatation or surrounding inflammatory changes. Spleen: Normal in size without focal abnormality. Adrenals/Urinary Tract: Both adrenal glands appear normal. Tiny hypodense lesion seen within the right upper kidney. The left kidney is unremarkable. No renal or collecting system calculi. The bladder is partially distended. Stomach/Bowel: The stomach and small bowel are normal in appearance. Scattered colonic diverticula are seen throughout without evidence diverticulitis. The appendix is normal in appearance. Vascular/Lymphatic: There are no enlarged mesenteric, retroperitoneal, or pelvic lymph nodes. No significant vascular findings  are present. Reproductive: The prostate is unremarkable. Other: No evidence of abdominal wall mass or hernia. Musculoskeletal: No acute or significant osseous findings. IMPRESSION: Normal appendix. Colonic diverticula without diverticulitis. No acute intra-abdominal or pelvic pathology to explain the patient's symptoms. Electronically Signed   By: Jonna Clark M.D.   On: 08/22/2019 02:16    Procedures Procedures (including critical care time)  Medications Ordered in ED Medications  dicyclomine (BENTYL) capsule 10 mg (has no administration in time range)  famotidine (PEPCID) IVPB 20 mg premix (has no administration in time range)  sodium chloride flush (NS) 0.9 % injection 3 mL (3 mLs Intravenous Given 08/22/19 0042)  ondansetron (ZOFRAN) injection 4 mg (4 mg Intravenous Given 08/22/19 0042)  morphine 4 MG/ML injection 4 mg (4 mg Intravenous Given 08/22/19 0042)  sodium chloride 0.9 % bolus 1,000 mL (0 mLs Intravenous Stopped 08/22/19 0140)  iohexol (OMNIPAQUE) 300 MG/ML solution 100 mL (100 mLs Intravenous Contrast Given 08/22/19 0155)  sodium chloride 0.9 % bolus 1,000 mL (1,000 mLs Intravenous New Bag/Given 08/22/19 0553)     Initial Impression / Assessment and Plan / ED Course  I have reviewed the triage vital signs and the nursing notes.  Pertinent labs & imaging results that were available during my care of the patient were reviewed by me and considered in my medical decision making (see chart for details).        Patient presenting with a 2 to 3-day history of nausea, vomiting, diarrhea, abdominal pain.  Patient having right lower quadrant and McBurney's tenderness on my initial exam.  CT abdomen pelvis is negative.  Lactic acid elevated and obtained initially due to patient meeting sepsis criteria, however likely all related to dehydration.  No leukocytosis and vitals improved after fluids.  Patient is afebrile here.  IV fluids and oral fluid challenge in process. At shift change,  patient care transferred to [facility], PA-C for continued evaluation, follow up of fluids, symptom control and determination of disposition. Anticipate discharge if symptom control and tolerating oral fluids, lactic clearing.    Final Clinical Impressions(s) / ED Diagnoses   Final diagnoses:  None    ED Discharge Orders    None  Emi Holes, PA-C 08/22/19 0981    Marily Memos, MD 08/22/19 [phone]

## 2019-08-22 NOTE — ED Provider Notes (Signed)
Patient presenting with a 2-day history of lower abdominal pain, nausea, vomiting, diarrhea.  He is unable to keep anything down.  He has mostly lower abdominal pain.  He has right lower quadrant and McBurney's point.  He has positive psoas sign.  He reports he has not been urinating very much.   MSE was initiated and I personally evaluated the patient and placed orders (if any) at  12:26 AM on August 22, 2019.  The patient appears stable so that the remainder of the MSE may be completed by another provider.   Frederica Kuster, PA-C 08/22/19 0029    Merrily Pew, MD 08/22/19 0600

## 2019-08-22 NOTE — ED Provider Notes (Signed)
  Physical Exam  BP 135/78   Pulse 88   Temp 98.7 F (37.1 C) (Oral)   Resp 18   Ht 5\' 8"  (1.727 m)   Wt 77.1 kg   SpO2 98%   BMI 25.85 kg/m   Physical Exam  Gen: appears nontoxic.    ED Course/Procedures     Procedures  MDM  Pt sigend out to me by A Law, PA-C. Please see previous notes for further history.   In brief, pt presenting for evaluation of n/v/d and abd pain. Has been unbale to tolerate PO for several days. Pt states this happens about x2/yr. H/o etoh, states he has been drinking less (but maybe still drinking?). States he is no longer smoking marijuana.  CT scan was negative.  Show elevated lactic, likely due to dehydration.  Patient is receiving his second liter of fluids.  Plan to recheck lactic acid after fluids and do p.o. challenge.  If lactic improved and patient tolerates p.o., plan for discharge.  Repeat lactic acid 0.8.  Patient tolerating p.o. without difficulty.  Discussed continued symptomatic treatment at home with Zofran and Bentyl.  Discussed close follow-up with primary care symptoms not improving.  At this time, patient appears safe for discharge.  Return precautions given.  Patient states he understands and agrees to plan.       Franchot Heidelberg, PA-C 08/22/19 1259    Margette Fast, MD 08/23/19 [phone]

## 2019-08-22 NOTE — ED Notes (Signed)
Patient verbalizes understanding of discharge instructions. Opportunity for questioning and answers were provided. Armband removed by staff, pt discharged from ED.  

## 2019-08-22 NOTE — ED Notes (Signed)
Patient transported to CT 

## 2019-08-22 NOTE — ED Triage Notes (Signed)
Pt reports lower abd pain that started two days ago and has gotten worse. Pt reports n/v/d. Pt has been unable to keep anything down. No blood in vomit, diarrhea. Dark urine. No urinary difficulties.

## 2019-08-22 NOTE — Discharge Instructions (Addendum)
Follow-up with your primary care doctor if your symptoms not improving. Continue taking home medications as prescribed. Use your home Zofran as needed for nausea or vomiting. Make sure you are staying well hydrated with water.  Use Tylenol as needed for abdominal pain.  You also may use Bentyl as needed for abdominal pain. Return to the emergency room if you develop persistent vomiting despite medication, blood in your vomit, severe worsening abdominal pain, any new, worsening, or concerning symptoms.

## 2019-08-24 LAB — NOVEL CORONAVIRUS, NAA: SARS-CoV-2, NAA: NOT DETECTED

## 2019-08-26 NOTE — Progress Notes (Signed)
Please inform of negative COVID-19

## 2019-10-02 ENCOUNTER — Emergency Department (HOSPITAL_COMMUNITY): Payer: BC Managed Care – PPO

## 2019-10-02 ENCOUNTER — Encounter (HOSPITAL_COMMUNITY): Payer: Self-pay

## 2019-10-02 ENCOUNTER — Emergency Department (HOSPITAL_COMMUNITY)
Admission: EM | Admit: 2019-10-02 | Discharge: 2019-10-02 | Discharge status: Against Medical Advice | Payer: BC Managed Care – PPO | Attending: Emergency Medicine | Admitting: Emergency Medicine

## 2019-10-02 ENCOUNTER — Ambulatory Visit: Payer: BC Managed Care – PPO

## 2019-10-02 ENCOUNTER — Other Ambulatory Visit: Payer: Self-pay

## 2019-10-02 DIAGNOSIS — Z20822 Contact with and (suspected) exposure to covid-19: Secondary | ICD-10-CM

## 2019-10-02 DIAGNOSIS — Z5321 Procedure and treatment not carried out due to patient leaving prior to being seen by health care provider: Secondary | ICD-10-CM | POA: Insufficient documentation

## 2019-10-02 LAB — BASIC METABOLIC PANEL
Anion gap: 15 (ref 5–15)
BUN: 17 mg/dL (ref 6–20)
CO2: 22 mmol/L (ref 22–32)
Calcium: 9.1 mg/dL (ref 8.9–10.3)
Chloride: 103 mmol/L (ref 98–111)
Creatinine, Ser: 1.09 mg/dL (ref 0.61–1.24)
GFR calc Af Amer: >60 mL/min (ref >60)
GFR calc non Af Amer: >60 mL/min (ref >60)
Glucose, Bld: 98 mg/dL (ref 70–99)
Potassium: 4.3 mmol/L (ref 3.5–5.1)
Sodium: 140 mmol/L (ref 135–145)

## 2019-10-02 LAB — CBC
HCT: 45.7 % (ref 39.0–52.0)
Hemoglobin: 15.6 g/dL (ref 13.0–17.0)
MCH: 31.1 pg (ref 26.0–34.0)
MCHC: 34.1 g/dL (ref 30.0–36.0)
MCV: 91.2 fL (ref 80.0–100.0)
Platelets: 350 10*3/uL (ref 150–400)
RBC: 5.01 MIL/uL (ref 4.22–5.81)
RDW: 13.2 % (ref 11.5–15.5)
WBC: 8.3 10*3/uL (ref 4.0–10.5)
nRBC: 0 % (ref 0.0–0.2)

## 2019-10-02 LAB — TROPONIN I (HIGH SENSITIVITY): Troponin I (High Sensitivity): <2 ng/L (ref <18)

## 2019-10-02 NOTE — ED Notes (Signed)
Pt  Left the waiting room.

## 2019-10-02 NOTE — ED Notes (Signed)
Pt has not returned to the waiting room.

## 2019-10-02 NOTE — ED Triage Notes (Addendum)
Pt had COVID test done at drive thru testing site but has not gotten results back yet. Pt reports worsening SOB, cough and chest tightness. Pt alert, coughing in triage.

## 2019-10-03 LAB — NOVEL CORONAVIRUS, NAA: SARS-CoV-2, NAA: NOT DETECTED

## 2019-10-06 ENCOUNTER — Other Ambulatory Visit: Payer: Self-pay

## 2019-10-06 ENCOUNTER — Inpatient Hospital Stay (HOSPITAL_COMMUNITY)
Admission: EM | Admit: 2019-10-06 | Discharge: 2019-10-10 | DRG: 917 | Disposition: A | Discharge status: Other Acute Inpatient Hospital | Payer: BC Managed Care – PPO | Attending: Internal Medicine | Admitting: Internal Medicine

## 2019-10-06 DIAGNOSIS — R338 Other retention of urine: Secondary | ICD-10-CM | POA: Diagnosis not present

## 2019-10-06 DIAGNOSIS — I4581 Long QT syndrome: Secondary | ICD-10-CM | POA: Diagnosis present

## 2019-10-06 DIAGNOSIS — F1011 Alcohol abuse, in remission: Secondary | ICD-10-CM | POA: Diagnosis present

## 2019-10-06 DIAGNOSIS — Y908 Blood alcohol level of 240 mg/100 ml or more: Secondary | ICD-10-CM | POA: Diagnosis present

## 2019-10-06 DIAGNOSIS — Z20828 Contact with and (suspected) exposure to other viral communicable diseases: Secondary | ICD-10-CM | POA: Diagnosis present

## 2019-10-06 DIAGNOSIS — Z79899 Other long term (current) drug therapy: Secondary | ICD-10-CM

## 2019-10-06 DIAGNOSIS — R45851 Suicidal ideations: Secondary | ICD-10-CM | POA: Diagnosis present

## 2019-10-06 DIAGNOSIS — F10120 Alcohol abuse with intoxication, uncomplicated: Secondary | ICD-10-CM | POA: Diagnosis present

## 2019-10-06 DIAGNOSIS — T50902A Poisoning by unspecified drugs, medicaments and biological substances, intentional self-harm, initial encounter: Secondary | ICD-10-CM | POA: Diagnosis present

## 2019-10-06 DIAGNOSIS — Z9114 Patient's other noncompliance with medication regimen: Secondary | ICD-10-CM

## 2019-10-06 DIAGNOSIS — Z781 Physical restraint status: Secondary | ICD-10-CM

## 2019-10-06 DIAGNOSIS — R739 Hyperglycemia, unspecified: Secondary | ICD-10-CM | POA: Diagnosis present

## 2019-10-06 DIAGNOSIS — R000 Tachycardia, unspecified: Secondary | ICD-10-CM | POA: Diagnosis present

## 2019-10-06 DIAGNOSIS — T450X2A Poisoning by antiallergic and antiemetic drugs, intentional self-harm, initial encounter: Principal | ICD-10-CM | POA: Diagnosis present

## 2019-10-06 DIAGNOSIS — F329 Major depressive disorder, single episode, unspecified: Secondary | ICD-10-CM | POA: Diagnosis present

## 2019-10-06 DIAGNOSIS — F909 Attention-deficit hyperactivity disorder, unspecified type: Secondary | ICD-10-CM | POA: Diagnosis present

## 2019-10-06 DIAGNOSIS — Y92008 Other place in unspecified non-institutional (private) residence as the place of occurrence of the external cause: Secondary | ICD-10-CM

## 2019-10-06 DIAGNOSIS — Z915 Personal history of self-harm: Secondary | ICD-10-CM

## 2019-10-06 DIAGNOSIS — F10920 Alcohol use, unspecified with intoxication, uncomplicated: Secondary | ICD-10-CM

## 2019-10-06 DIAGNOSIS — T1491XA Suicide attempt, initial encounter: Secondary | ICD-10-CM

## 2019-10-06 DIAGNOSIS — G92 Toxic encephalopathy: Secondary | ICD-10-CM | POA: Diagnosis present

## 2019-10-06 DIAGNOSIS — Z888 Allergy status to other drugs, medicaments and biological substances status: Secondary | ICD-10-CM

## 2019-10-06 DIAGNOSIS — R4020 Unspecified coma: Secondary | ICD-10-CM | POA: Diagnosis not present

## 2019-10-06 DIAGNOSIS — F17200 Nicotine dependence, unspecified, uncomplicated: Secondary | ICD-10-CM | POA: Diagnosis present

## 2019-10-06 LAB — CBC WITH DIFFERENTIAL/PLATELET
Abs Immature Granulocytes: 0.04 10*3/uL (ref 0.00–0.07)
Basophils Absolute: 0.1 10*3/uL (ref 0.0–0.1)
Basophils Relative: 1 %
Eosinophils Absolute: 0.2 10*3/uL (ref 0.0–0.5)
Eosinophils Relative: 3 %
HCT: 42.6 % (ref 39.0–52.0)
Hemoglobin: 14.5 g/dL (ref 13.0–17.0)
Immature Granulocytes: 1 %
Lymphocytes Relative: 42 %
Lymphs Abs: 2.6 10*3/uL (ref 0.7–4.0)
MCH: 31.5 pg (ref 26.0–34.0)
MCHC: 34 g/dL (ref 30.0–36.0)
MCV: 92.6 fL (ref 80.0–100.0)
Monocytes Absolute: 0.6 10*3/uL (ref 0.1–1.0)
Monocytes Relative: 9 %
Neutro Abs: 2.8 10*3/uL (ref 1.7–7.7)
Neutrophils Relative %: 44 %
Platelets: 245 10*3/uL (ref 150–400)
RBC: 4.6 MIL/uL (ref 4.22–5.81)
RDW: 13.7 % (ref 11.5–15.5)
WBC: 6.2 10*3/uL (ref 4.0–10.5)
nRBC: 0 % (ref 0.0–0.2)

## 2019-10-06 LAB — RAPID URINE DRUG SCREEN, HOSP PERFORMED
Amphetamines: NOT DETECTED
Barbiturates: NOT DETECTED
Benzodiazepines: NOT DETECTED
Cocaine: NOT DETECTED
Opiates: NOT DETECTED
Tetrahydrocannabinol: NOT DETECTED

## 2019-10-06 LAB — COMPREHENSIVE METABOLIC PANEL
ALT: 17 U/L (ref 0–44)
AST: 21 U/L (ref 15–41)
Albumin: 3.9 g/dL (ref 3.5–5.0)
Alkaline Phosphatase: 64 U/L (ref 38–126)
Anion gap: 12 (ref 5–15)
BUN: 13 mg/dL (ref 6–20)
CO2: 25 mmol/L (ref 22–32)
Calcium: 8.8 mg/dL — ABNORMAL LOW (ref 8.9–10.3)
Chloride: 105 mmol/L (ref 98–111)
Creatinine, Ser: 0.95 mg/dL (ref 0.61–1.24)
GFR calc Af Amer: >60 mL/min (ref >60)
GFR calc non Af Amer: >60 mL/min (ref >60)
Glucose, Bld: 66 mg/dL — ABNORMAL LOW (ref 70–99)
Potassium: 3.7 mmol/L (ref 3.5–5.1)
Sodium: 142 mmol/L (ref 135–145)
Total Bilirubin: 0.8 mg/dL (ref 0.3–1.2)
Total Protein: 6.6 g/dL (ref 6.5–8.1)

## 2019-10-06 LAB — SALICYLATE LEVEL: Salicylate Lvl: <7 mg/dL — ABNORMAL LOW (ref 7.0–30.0)

## 2019-10-06 LAB — ETHANOL: Alcohol, Ethyl (B): 272 mg/dL — ABNORMAL HIGH (ref <10)

## 2019-10-06 LAB — ACETAMINOPHEN LEVEL: Acetaminophen (Tylenol), Serum: <10 ug/mL — ABNORMAL LOW (ref 10–30)

## 2019-10-06 MED ORDER — SODIUM CHLORIDE 0.9% FLUSH: 3.0000 mL | INTRAVENOUS | Status: DC | PRN

## 2019-10-06 MED ORDER — SODIUM CHLORIDE 0.9 % IV SOLN: 250.0000 mL | INTRAVENOUS | Status: DC | PRN

## 2019-10-06 MED ORDER — LORAZEPAM 2 MG/ML IJ SOLN
1.0000 mg | Freq: Once | INTRAMUSCULAR | Status: AC
  Administered 2019-10-07: 1 mg via INTRAVENOUS
  Filled 2019-10-06: qty 1

## 2019-10-06 MED ORDER — LORAZEPAM 2 MG/ML IJ SOLN
1.0000 mg | Freq: Once | INTRAMUSCULAR | Status: AC
  Administered 2019-10-06: 1 mg via INTRAVENOUS
  Filled 2019-10-06: qty 1

## 2019-10-06 MED ORDER — SODIUM CHLORIDE 0.9 % IV BOLUS
1000.0000 mL | Freq: Once | INTRAVENOUS | Status: AC
  Administered 2019-10-07: 1000 mL via INTRAVENOUS

## 2019-10-06 MED ORDER — SODIUM CHLORIDE 0.9% FLUSH
3.0000 mL | Freq: Two times a day (BID) | INTRAVENOUS | Status: DC
  Administered 2019-10-06: 3 mL via INTRAVENOUS

## 2019-10-06 MED ORDER — SODIUM CHLORIDE 0.9 % IV BOLUS
1000.0000 mL | Freq: Once | INTRAVENOUS | Status: AC
  Administered 2019-10-06: 1000 mL via INTRAVENOUS

## 2019-10-06 MED ORDER — LORAZEPAM 2 MG/ML IJ SOLN: 1.0000 mg | Freq: Once | INTRAMUSCULAR | Status: DC | PRN

## 2019-10-06 MED ORDER — DEXTROSE 50 % IV SOLN
1.0000 | Freq: Once | INTRAVENOUS | Status: AC
  Administered 2019-10-06: 50 mL via INTRAVENOUS
  Filled 2019-10-06: qty 50

## 2019-10-06 NOTE — ED Provider Notes (Signed)
[facility] Provider Note   CSN: [id number] Arrival date & time: 10/06/19  2050     History Chief Complaint  Patient presents with  . Suicide Attempt    Brett Sanford is a 28 y.o. male presenting after suicide attempt.  Level 5 caveat due to altered mental status.  History obtained from EMS.  EMS states mom called after patient sent his sister a text stating that he would never see her again because he was going to overdose on sleeping pills.  When EMS arrived, patient was not responding verbally or by opening his eyes.  Will localize and respond to pain.  No pill bottles were found at the house by mom or EMS.  Mom reports patient does have a history of alcohol abuse and unknown drug abuse.  Additional history obtained from chart review.  Patient with a history of ADHD, diarrhea, seizure-like activity, depression, alcohol use, drug use.  Med list includes Sonata and Klonopin.   HPI     Past Medical History:  Diagnosis Date  . ADD (attention deficit disorder)   . Drug abuse (HCC)   . Medical history non-contributory     Patient Active Problem List   Diagnosis Date Noted  . Viral illness 08/21/2019  . Hypokalemia 06/23/2019  . Attention deficit hyperactivity disorder (ADHD) 06/23/2019  . Seizure-like activity (HCC) 06/23/2019  . History of alcohol abuse 06/23/2019  . Chronic migraine 06/23/2019  . Diarrhea 06/23/2019  . MDD (major depressive disorder) 03/28/2019    Past Surgical History:  Procedure Laterality Date  . NO PAST SURGERIES         Family History  Family history unknown: Yes    Social History   Tobacco Use  . Smoking status: Current Every Day Smoker  . Smokeless tobacco: Never Used  Substance Use Topics  . Alcohol use: Yes    Comment: hx of alcoholism  . Drug use: Yes    Types: Marijuana    Home Medications Prior to Admission medications   Medication Sig Start Date End Date Taking? Authorizing  Provider  clonazePAM (KLONOPIN) 1 MG tablet Take 1 mg by mouth 2 (two) times daily as needed for anxiety.  06/03/19   [provider]  dicyclomine (BENTYL) 20 MG tablet Take 1 tablet (20 mg total) by mouth 2 (two) times daily as needed for spasms. 08/22/19   Ady Heimann, PA-C  esomeprazole (NEXIUM) 20 MG capsule Take 1 capsule (20 mg total) by mouth daily before breakfast. Patient not taking: Reported on 08/21/2019 05/10/19   Wallis BambergMani, Mario, PA-C  FLUoxetine (PROZAC) 40 MG capsule Take 40 mg by mouth daily.  05/03/19   [provider]  lisdexamfetamine (VYVANSE) 30 MG capsule Take 30 mg by mouth daily.    [provider]  naltrexone (DEPADE) 50 MG tablet Take 50 mg by mouth daily.    [provider]  ondansetron (ZOFRAN) 4 MG tablet Take 1 tablet (4 mg total) by mouth every 8 (eight) hours as needed for nausea or vomiting. 08/21/19   Everrett CoombeMatthews, Cody, DO  zaleplon (SONATA) 10 MG capsule Take 10 mg by mouth at bedtime.  05/03/19   [provider]    Allergies    Benadryl [diphenhydramine hcl]  Review of Systems   Review of Systems  Unable to perform ROS: Mental status change  Psychiatric/Behavioral: Positive for self-injury and suicidal ideas.    Physical Exam Updated Vital Signs BP 138/82   Pulse 130   Temp  98.1 F (36.7 C)   Resp (!) 33   SpO2 100%   Physical Exam Vitals and nursing note reviewed.  Constitutional:      General: He is not in acute distress.    Appearance: He is well-developed.     Comments: Appears nontoxic  HENT:     Head: Normocephalic and atraumatic.  Eyes:     Conjunctiva/sclera: Conjunctivae normal.     Pupils: Pupils are equal, round, and reactive to light.  Cardiovascular:     Rate and Rhythm: Regular rhythm. Tachycardia present.     Pulses: Normal pulses.     Comments: Tachycardic around 130 Pulmonary:     Effort: Pulmonary effort is normal. No respiratory distress.     Breath sounds: Normal breath  sounds. No wheezing.  Abdominal:     General: There is no distension.     Palpations: Abdomen is soft. There is no mass.     Tenderness: There is no abdominal tenderness. There is no guarding or rebound.  Musculoskeletal:        General: Normal range of motion.     Cervical back: Normal range of motion and neck supple.     Right lower leg: No edema.     Left lower leg: No edema.  Skin:    General: Skin is warm and dry.     Capillary Refill: Capillary refill takes less than 2 seconds.  Neurological:     GCS: GCS eye subscore is 1. GCS verbal subscore is 1. GCS motor subscore is 5.     ED Results / Procedures / Treatments   Labs (all labs ordered are listed, but only abnormal results are displayed) Labs Reviewed  COMPREHENSIVE METABOLIC PANEL - Abnormal; Notable for the following components:      Result Value   Glucose, Bld 66 (*)    Calcium 8.8 (*)    All other components within normal limits  SALICYLATE LEVEL - Abnormal; Notable for the following components:   Salicylate Lvl <1.6 (*)    All other components within normal limits  ACETAMINOPHEN LEVEL - Abnormal; Notable for the following components:   Acetaminophen (Tylenol), Serum <10 (*)    All other components within normal limits  ETHANOL - Abnormal; Notable for the following components:   Alcohol, Ethyl (B) 272 (*)    All other components within normal limits  SARS CORONAVIRUS 2 (TAT 6-24 HRS)  CBC WITH DIFFERENTIAL/PLATELET  RAPID URINE DRUG SCREEN, HOSP PERFORMED  CBG MONITORING, ED  CBG MONITORING, ED  CBG MONITORING, ED    EKG EKG Interpretation  Date/Time:  Sunday October 06 2019 20:57:08 EST Ventricular Rate:  112 PR Interval:    QRS Duration: 90 QT Interval:  351 QTC Calculation: 480 R Axis:   43 Text Interpretation: Sinus tachycardia Borderline prolonged QT interval tachycardia new otherwise similar to previous Confirmed by Theotis Burrow [phone]) on 10/06/2019 9:58:43 PM   Radiology No results  found.  Procedures Procedures (including critical care time)  Medications Ordered in ED Medications  sodium chloride flush (NS) 0.9 % injection 3 mL (3 mLs Intravenous Given 10/06/19 2258)  sodium chloride flush (NS) 0.9 % injection 3 mL (has no administration in time range)  0.9 %  sodium chloride infusion (has no administration in time range)  LORazepam (ATIVAN) injection 1 mg (has no administration in time range)  dextrose 50 % solution 50 mL (50 mLs Intravenous Given 10/06/19 2250)  sodium chloride 0.9 % bolus 1,000 mL (1,000 mLs Intravenous New  Bag/Given 10/06/19 2253)    ED Course  I have reviewed the triage vital signs and the nursing notes.  Pertinent labs & imaging results that were available during my care of the patient were reviewed by me and considered in my medical decision making (see chart for details).    MDM Rules/Calculators/A&P                      Patient presenting for evaluation after suicide attempt.  Initial evaluation shows patient who will not respond verbally nor open his eyes, however is localizing pain and moving all extremities. gcs 7.  He is protecting his airway at this time.  He is tachycardic, but not hyperthermic or significantly tachypneic.  We will continue to monitor.  Will obtain medical screening clearance labs.  Discussed with poison control who recommends observation for 6 to 8 hours after OD.  Based on patient's med list, low suspicion for life-threatening overdose.  Recommends repeat EKG prior to clearance.   Labs show mild hyperglycemia at 66, will give D50 and continue to monitor.  Ethanol elevated at 272.  Fluid bolus given for tachycardia.  Labs otherwise reassuring.  Patient now talking, but incoherent. gcs 14. Not able to respond to questions appropriately, he is not telling me what he took tonight or if he attempted to kill himself.  He does state that he has a history of Ritalin abuse (and he is currently on vyvance).  He continues to  be tachycardic and, becoming more agitated. Will give ativan and request sitter.   Pt signed out to [employer], PA-C for f/u on obvs and TTS eval after medical clearance.   Final Clinical Impression(s) / ED Diagnoses Final diagnoses:  Suicide attempt (HCC)  Tachycardia  Alcoholic intoxication without complication [facility])    Rx / DC Orders ED Discharge Orders    None       Alveria Apley, PA-C 10/06/19 2339    Little, Ambrose Finland, MD 10/08/19 1247

## 2019-10-06 NOTE — ED Triage Notes (Signed)
Pt arrives from home via [facility].  Pt found by mom on porch, unresponsive. Per mom and sister, pt sent a text message to sister stating "I love you, you will never see me again, I am going to take three handfuls of sleeping pills."  Per EMS, neither the police nor the mother could find any substance in the house.  Pt not speaking, reactive to touch.

## 2019-10-06 NOTE — ED Notes (Signed)
Called staffing to ask if pt could have a sitter, was told by Dominica Severin that there were none they could spare at the moment.

## 2019-10-07 ENCOUNTER — Other Ambulatory Visit: Payer: Self-pay

## 2019-10-07 ENCOUNTER — Encounter (HOSPITAL_COMMUNITY): Payer: Self-pay | Admitting: Internal Medicine

## 2019-10-07 DIAGNOSIS — I4581 Long QT syndrome: Secondary | ICD-10-CM | POA: Diagnosis present

## 2019-10-07 DIAGNOSIS — Z79899 Other long term (current) drug therapy: Secondary | ICD-10-CM | POA: Diagnosis not present

## 2019-10-07 DIAGNOSIS — Z915 Personal history of self-harm: Secondary | ICD-10-CM | POA: Diagnosis not present

## 2019-10-07 DIAGNOSIS — R739 Hyperglycemia, unspecified: Secondary | ICD-10-CM | POA: Diagnosis present

## 2019-10-07 DIAGNOSIS — Z781 Physical restraint status: Secondary | ICD-10-CM | POA: Diagnosis not present

## 2019-10-07 DIAGNOSIS — Z9114 Patient's other noncompliance with medication regimen: Secondary | ICD-10-CM | POA: Diagnosis not present

## 2019-10-07 DIAGNOSIS — R338 Other retention of urine: Secondary | ICD-10-CM | POA: Diagnosis not present

## 2019-10-07 DIAGNOSIS — F10920 Alcohol use, unspecified with intoxication, uncomplicated: Secondary | ICD-10-CM | POA: Diagnosis not present

## 2019-10-07 DIAGNOSIS — Z888 Allergy status to other drugs, medicaments and biological substances status: Secondary | ICD-10-CM | POA: Diagnosis not present

## 2019-10-07 DIAGNOSIS — G92 Toxic encephalopathy: Secondary | ICD-10-CM | POA: Diagnosis present

## 2019-10-07 DIAGNOSIS — Y908 Blood alcohol level of 240 mg/100 ml or more: Secondary | ICD-10-CM | POA: Diagnosis present

## 2019-10-07 DIAGNOSIS — F909 Attention-deficit hyperactivity disorder, unspecified type: Secondary | ICD-10-CM | POA: Diagnosis present

## 2019-10-07 DIAGNOSIS — Z20828 Contact with and (suspected) exposure to other viral communicable diseases: Secondary | ICD-10-CM | POA: Diagnosis present

## 2019-10-07 DIAGNOSIS — F1011 Alcohol abuse, in remission: Secondary | ICD-10-CM | POA: Diagnosis not present

## 2019-10-07 DIAGNOSIS — R000 Tachycardia, unspecified: Secondary | ICD-10-CM | POA: Diagnosis present

## 2019-10-07 DIAGNOSIS — F10120 Alcohol abuse with intoxication, uncomplicated: Secondary | ICD-10-CM | POA: Diagnosis present

## 2019-10-07 DIAGNOSIS — T1491XA Suicide attempt, initial encounter: Secondary | ICD-10-CM | POA: Insufficient documentation

## 2019-10-07 DIAGNOSIS — T50902A Poisoning by unspecified drugs, medicaments and biological substances, intentional self-harm, initial encounter: Secondary | ICD-10-CM | POA: Diagnosis present

## 2019-10-07 DIAGNOSIS — R4020 Unspecified coma: Secondary | ICD-10-CM | POA: Diagnosis present

## 2019-10-07 DIAGNOSIS — T450X2A Poisoning by antiallergic and antiemetic drugs, intentional self-harm, initial encounter: Secondary | ICD-10-CM | POA: Diagnosis present

## 2019-10-07 DIAGNOSIS — F332 Major depressive disorder, recurrent severe without psychotic features: Secondary | ICD-10-CM | POA: Diagnosis not present

## 2019-10-07 DIAGNOSIS — F329 Major depressive disorder, single episode, unspecified: Secondary | ICD-10-CM | POA: Diagnosis present

## 2019-10-07 DIAGNOSIS — Y92008 Other place in unspecified non-institutional (private) residence as the place of occurrence of the external cause: Secondary | ICD-10-CM | POA: Diagnosis not present

## 2019-10-07 DIAGNOSIS — R45851 Suicidal ideations: Secondary | ICD-10-CM | POA: Diagnosis present

## 2019-10-07 DIAGNOSIS — F17200 Nicotine dependence, unspecified, uncomplicated: Secondary | ICD-10-CM | POA: Diagnosis present

## 2019-10-07 LAB — MAGNESIUM
Magnesium: 1.6 mg/dL — ABNORMAL LOW (ref 1.7–2.4)
Magnesium: 1.5 mg/dL — ABNORMAL LOW (ref 1.7–2.4)

## 2019-10-07 LAB — CBC WITH DIFFERENTIAL/PLATELET
Abs Immature Granulocytes: 0.06 10*3/uL (ref 0.00–0.07)
Basophils Absolute: 0.1 10*3/uL (ref 0.0–0.1)
Basophils Relative: 1 %
Eosinophils Absolute: 0 10*3/uL (ref 0.0–0.5)
Eosinophils Relative: 0 %
HCT: 38.2 % — ABNORMAL LOW (ref 39.0–52.0)
Hemoglobin: 13.2 g/dL (ref 13.0–17.0)
Immature Granulocytes: 1 %
Lymphocytes Relative: 17 %
Lymphs Abs: 1.5 10*3/uL (ref 0.7–4.0)
MCH: 31.5 pg (ref 26.0–34.0)
MCHC: 34.6 g/dL (ref 30.0–36.0)
MCV: 91.2 fL (ref 80.0–100.0)
Monocytes Absolute: 0.5 10*3/uL (ref 0.1–1.0)
Monocytes Relative: 6 %
Neutro Abs: 6.9 10*3/uL (ref 1.7–7.7)
Neutrophils Relative %: 75 %
Platelets: 236 10*3/uL (ref 150–400)
RBC: 4.19 MIL/uL — ABNORMAL LOW (ref 4.22–5.81)
RDW: 13.7 % (ref 11.5–15.5)
WBC: 9 10*3/uL (ref 4.0–10.5)
nRBC: 0 % (ref 0.0–0.2)

## 2019-10-07 LAB — BASIC METABOLIC PANEL
Anion gap: 13 (ref 5–15)
BUN: 9 mg/dL (ref 6–20)
CO2: 22 mmol/L (ref 22–32)
Calcium: 8.4 mg/dL — ABNORMAL LOW (ref 8.9–10.3)
Chloride: 107 mmol/L (ref 98–111)
Creatinine, Ser: 0.86 mg/dL (ref 0.61–1.24)
GFR calc Af Amer: >60 mL/min (ref >60)
GFR calc non Af Amer: >60 mL/min (ref >60)
Glucose, Bld: 86 mg/dL (ref 70–99)
Potassium: 3.8 mmol/L (ref 3.5–5.1)
Sodium: 142 mmol/L (ref 135–145)

## 2019-10-07 LAB — GLUCOSE, CAPILLARY
Glucose-Capillary: 83 mg/dL (ref 70–99)
Glucose-Capillary: 74 mg/dL (ref 70–99)
Glucose-Capillary: 66 mg/dL — ABNORMAL LOW (ref 70–99)
Glucose-Capillary: 94 mg/dL (ref 70–99)

## 2019-10-07 LAB — SALICYLATE LEVEL: Salicylate Lvl: <7 mg/dL — ABNORMAL LOW (ref 7.0–30.0)

## 2019-10-07 LAB — HEPATIC FUNCTION PANEL
ALT: 16 U/L (ref 0–44)
AST: 23 U/L (ref 15–41)
Albumin: 3.8 g/dL (ref 3.5–5.0)
Alkaline Phosphatase: 54 U/L (ref 38–126)
Bilirubin, Direct: 0.2 mg/dL (ref 0.0–0.2)
Indirect Bilirubin: 0.9 mg/dL (ref 0.3–0.9)
Total Bilirubin: 1.1 mg/dL (ref 0.3–1.2)
Total Protein: 6.3 g/dL — ABNORMAL LOW (ref 6.5–8.1)

## 2019-10-07 LAB — CBG MONITORING, ED: Glucose-Capillary: 100 mg/dL — ABNORMAL HIGH (ref 70–99)

## 2019-10-07 LAB — SARS CORONAVIRUS 2 (TAT 6-24 HRS): SARS Coronavirus 2: NEGATIVE

## 2019-10-07 LAB — ACETAMINOPHEN LEVEL: Acetaminophen (Tylenol), Serum: <10 ug/mL — ABNORMAL LOW (ref 10–30)

## 2019-10-07 LAB — AMMONIA: Ammonia: 13 umol/L (ref 9–35)

## 2019-10-07 LAB — PROTIME-INR
INR: 1 (ref 0.8–1.2)
Prothrombin Time: 13.4 seconds (ref 11.4–15.2)

## 2019-10-07 MED ORDER — LORAZEPAM 2 MG/ML IJ SOLN
0.0000 mg | Freq: Four times a day (QID) | INTRAMUSCULAR | Status: AC
  Administered 2019-10-07: 3 mg via INTRAVENOUS
  Administered 2019-10-07 (×2): 2 mg via INTRAVENOUS
  Administered 2019-10-07: 3 mg via INTRAVENOUS
  Filled 2019-10-07: qty 1
  Filled 2019-10-07 (×2): qty 2
  Filled 2019-10-07: qty 1

## 2019-10-07 MED ORDER — THIAMINE HCL 100 MG PO TABS: 100.0000 mg | ORAL_TABLET | Freq: Every day | ORAL | Status: DC

## 2019-10-07 MED ORDER — THIAMINE HCL 100 MG/ML IJ SOLN: 100.0000 mg | Freq: Every day | INTRAMUSCULAR | Status: DC

## 2019-10-07 MED ORDER — ADULT MULTIVITAMIN W/MINERALS CH
1.0000 | ORAL_TABLET | Freq: Every day | ORAL | Status: DC
  Administered 2019-10-07 – 2019-10-10 (×4): 1 via ORAL
  Filled 2019-10-07 (×4): qty 1

## 2019-10-07 MED ORDER — THIAMINE HCL 100 MG/ML IJ SOLN
100.0000 mg | Freq: Every day | INTRAMUSCULAR | Status: DC
  Administered 2019-10-07: 100 mg via INTRAVENOUS
  Filled 2019-10-07: qty 2

## 2019-10-07 MED ORDER — SODIUM CHLORIDE 0.9 % IV BOLUS
500.0000 mL | Freq: Once | INTRAVENOUS | Status: AC
  Administered 2019-10-07: 500 mL via INTRAVENOUS

## 2019-10-07 MED ORDER — THIAMINE HCL 100 MG PO TABS
100.0000 mg | ORAL_TABLET | Freq: Every day | ORAL | Status: DC
  Administered 2019-10-08 – 2019-10-10 (×3): 100 mg via ORAL
  Filled 2019-10-07 (×3): qty 1

## 2019-10-07 MED ORDER — LORAZEPAM 1 MG PO TABS
1.0000 mg | ORAL_TABLET | ORAL | Status: AC | PRN
  Administered 2019-10-08: 2 mg via ORAL
  Administered 2019-10-08: 3 mg via ORAL
  Administered 2019-10-08: 4 mg via ORAL
  Administered 2019-10-09: 1 mg via ORAL
  Filled 2019-10-07: qty 3
  Filled 2019-10-07: qty 1
  Filled 2019-10-07: qty 2

## 2019-10-07 MED ORDER — POTASSIUM CHLORIDE 10 MEQ/100ML IV SOLN
10.0000 meq | Freq: Once | INTRAVENOUS | Status: AC
  Administered 2019-10-07: 10 meq via INTRAVENOUS
  Filled 2019-10-07: qty 100

## 2019-10-07 MED ORDER — LORAZEPAM 2 MG/ML IJ SOLN
0.0000 mg | Freq: Two times a day (BID) | INTRAMUSCULAR | Status: DC
  Filled 2019-10-07 (×2): qty 1

## 2019-10-07 MED ORDER — LORAZEPAM 2 MG/ML IJ SOLN: 0.0000 mg | Freq: Four times a day (QID) | INTRAMUSCULAR | Status: DC

## 2019-10-07 MED ORDER — MAGNESIUM SULFATE 2 GM/50ML IV SOLN
2.0000 g | Freq: Once | INTRAVENOUS | Status: AC
  Administered 2019-10-07: 2 g via INTRAVENOUS
  Filled 2019-10-07: qty 50

## 2019-10-07 MED ORDER — LORAZEPAM 1 MG PO TABS: 0.0000 mg | ORAL_TABLET | Freq: Two times a day (BID) | ORAL | Status: DC

## 2019-10-07 MED ORDER — FOLIC ACID 1 MG PO TABS
1.0000 mg | ORAL_TABLET | Freq: Every day | ORAL | Status: DC
  Administered 2019-10-07 – 2019-10-10 (×4): 1 mg via ORAL
  Filled 2019-10-07 (×4): qty 1

## 2019-10-07 MED ORDER — LORAZEPAM 2 MG/ML IJ SOLN
1.0000 mg | INTRAMUSCULAR | Status: AC | PRN
  Administered 2019-10-07 (×5): 2 mg via INTRAVENOUS
  Administered 2019-10-07: 3 mg via INTRAVENOUS
  Administered 2019-10-07 (×2): 2 mg via INTRAVENOUS
  Administered 2019-10-08: 3 mg via INTRAVENOUS
  Administered 2019-10-09: 1 mg via INTRAVENOUS
  Filled 2019-10-07 (×2): qty 1
  Filled 2019-10-07: qty 2
  Filled 2019-10-07 (×5): qty 1
  Filled 2019-10-07: qty 2
  Filled 2019-10-07: qty 1

## 2019-10-07 MED ORDER — SODIUM CHLORIDE 0.9 % IV SOLN: INTRAVENOUS | Status: DC

## 2019-10-07 MED ORDER — DEXTROSE 50 % IV SOLN
12.5000 g | INTRAVENOUS | Status: AC
  Administered 2019-10-07: 12.5 g via INTRAVENOUS

## 2019-10-07 MED ORDER — DEXTROSE-NACL 5-0.9 % IV SOLN: INTRAVENOUS | Status: DC

## 2019-10-07 MED ORDER — DEXTROSE 50 % IV SOLN
INTRAVENOUS | Status: AC
  Filled 2019-10-07: qty 50

## 2019-10-07 MED ORDER — LORAZEPAM 2 MG/ML IJ SOLN: 0.0000 mg | Freq: Two times a day (BID) | INTRAMUSCULAR | Status: DC

## 2019-10-07 MED ORDER — LORAZEPAM 1 MG PO TABS: 0.0000 mg | ORAL_TABLET | Freq: Four times a day (QID) | ORAL | Status: DC

## 2019-10-07 NOTE — H&P (Signed)
History and Physical    Brett Sanford EAV:[id number] DOB: 05-Dec-1990 DOA: 10/06/2019  PCP: Everrett Coombe, DO  Patient coming from: Home.  Chief Complaint: Unresponsiveness.  History obtained from patient's mother.  HPI: Brett Sanford is a 28 y.o. male with history of depression and ADD who has not been taking his medications and likely drinks alcohol every day was found to be unresponsive in his house on the porch by his mother.  Few minutes prior to this he had texted his sister " goodbye".  Patient's mother states that her Benadryl bottle which was full has been missing in her medication cupboard in the bathroom and she did see him going to the bathroom.  He has not taken his regular medications which were prescribed for him last couple of months which includes Prozac.  Patient also drinks alcohol off and on but mom is not able to exactly how often he drinks.  ED Course: In the ER patient was agitated confused initially mildly sedated.  EKG was sinus tachycardia with QTC of 520 ms.  Urine drug screen was negative alcohol level was 272 acetaminophen level and salicylates were negative.  Poison control at this time was contacted and primary concern is for Benadryl overdose and requested to check EKG every 4-6 hours to make sure the QTC is not prolonging and also check metabolic panel closely for potassium and magnesium levels to be kept on the higher side.  Check bladder scan to make sure patient is not retaining urine and if it is then may need an out of cath.  Ativan for agitation.  A repeat EKG showed QTC was showing decreased elongation of 505 ms.  Patient was afebrile Covid test was negative.  Patient on my exam was completely confused and hallucinating.  Chest x-ray unremarkable.  Review of Systems: As per HPI, rest all negative.   Past Medical History:  Diagnosis Date  . ADD (attention deficit disorder)   . Drug abuse (HCC)   . Medical history non-contributory     Past  Surgical History:  Procedure Laterality Date  . NO PAST SURGERIES       reports that he has been smoking. He has never used smokeless tobacco. He reports current alcohol use. He reports current drug use. Drug: Marijuana.  Allergies  Allergen Reactions  . Benadryl [Diphenhydramine Hcl] Other (See Comments)    My pt hyper    Family History  Family history unknown: Yes    Prior to Admission medications   Medication Sig Start Date End Date Taking? Authorizing Provider  clonazePAM (KLONOPIN) 1 MG tablet Take 1 mg by mouth 2 (two) times daily as needed for anxiety.  06/03/19   [provider]  dicyclomine (BENTYL) 20 MG tablet Take 1 tablet (20 mg total) by mouth 2 (two) times daily as needed for spasms. 08/22/19   Caccavale, Sophia, PA-C  esomeprazole (NEXIUM) 20 MG capsule Take 1 capsule (20 mg total) by mouth daily before breakfast. Patient not taking: Reported on 08/21/2019 05/10/19   Wallis Bamberg, PA-C  FLUoxetine (PROZAC) 40 MG capsule Take 40 mg by mouth daily.  05/03/19   [provider]  lisdexamfetamine (VYVANSE) 30 MG capsule Take 30 mg by mouth daily.    [provider]  naltrexone (DEPADE) 50 MG tablet Take 50 mg by mouth daily.    [provider]  ondansetron (ZOFRAN) 4 MG tablet Take 1 tablet (4 mg total) by mouth every 8 (eight) hours as needed for nausea  or vomiting. 08/21/19   Luetta Nutting, DO  zaleplon (SONATA) 10 MG capsule Take 10 mg by mouth at bedtime.  05/03/19   [provider]    Physical Exam: Constitutional: Moderately built and nourished. Vitals:   10/06/19 2145 10/06/19 2243 10/06/19 2257 10/07/19 0033  BP: (!) 130/110  138/82 (!) 148/92  Pulse: (!) 112 (!) 131 (!) 154 (!) 129  Resp: 19 20 (!) 33 (!) 24  Temp:      SpO2: 100% 100% 100% 95%   Eyes: Anicteric no pallor. ENMT: No discharge from the ears eyes nose or mouth. Neck: No mass felt.  No neck rigidity. Respiratory: No rhonchi or  crepitations. Cardiovascular: S1-S2 heard. Abdomen: Soft nontender bowel sounds present. Musculoskeletal: No edema.  No joint effusion. Skin: No rash. Neurologic: Alert awake but not oriented to time place or person.  Moves all extremities. Psychiatric: Confused.   Labs on Admission: I have personally reviewed following labs and imaging studies  CBC: Recent Labs  Lab 10/02/19 1830 10/06/19 2057  WBC 8.3 6.2  NEUTROABS  --  2.8  HGB 15.6 14.5  HCT 45.7 42.6  MCV 91.2 92.6  PLT 350 818   Basic Metabolic Panel: Recent Labs  Lab 10/02/19 1830 10/06/19 2057  NA 140 142  K 4.3 3.7  CL 103 105  CO2 22 25  GLUCOSE 98 66*  BUN 17 13  CREATININE 1.09 0.95  CALCIUM 9.1 8.8*   GFR: CrCl cannot be calculated (Unknown ideal weight.). Liver Function Tests: Recent Labs  Lab 10/06/19 2057  AST 21  ALT 17  ALKPHOS 64  BILITOT 0.8  PROT 6.6  ALBUMIN 3.9   No results for input(s): LIPASE, AMYLASE in the last 168 hours. No results for input(s): AMMONIA in the last 168 hours. Coagulation Profile: No results for input(s): INR, PROTIME in the last 168 hours. Cardiac Enzymes: No results for input(s): CKTOTAL, CKMB, CKMBINDEX, TROPONINI in the last 168 hours. BNP (last 3 results) No results for input(s): PROBNP in the last 8760 hours. HbA1C: No results for input(s): HGBA1C in the last 72 hours. CBG: Recent Labs  Lab 10/07/19 0003  GLUCAP 100*   Lipid Profile: No results for input(s): CHOL, HDL, LDLCALC, TRIG, CHOLHDL, LDLDIRECT in the last 72 hours. Thyroid Function Tests: No results for input(s): TSH, T4TOTAL, FREET4, T3FREE, THYROIDAB in the last 72 hours. Anemia Panel: No results for input(s): VITAMINB12, FOLATE, FERRITIN, TIBC, IRON, RETICCTPCT in the last 72 hours. Urine analysis:    Component Value Date/Time   COLORURINE AMBER (A) 08/22/2019 0016   APPEARANCEUR HAZY (A) 08/22/2019 0016   LABSPEC 1.033 (H) 08/22/2019 0016   PHURINE 6.0 08/22/2019 0016    GLUCOSEU NEGATIVE 08/22/2019 0016   HGBUR NEGATIVE 08/22/2019 0016   BILIRUBINUR NEGATIVE 08/22/2019 0016   KETONESUR 20 (A) 08/22/2019 0016   PROTEINUR 30 (A) 08/22/2019 0016   NITRITE NEGATIVE 08/22/2019 0016   LEUKOCYTESUR NEGATIVE 08/22/2019 0016   Sepsis Labs: @LABRCNTIP (procalcitonin:4,lacticidven:4) ) Recent Results (from the past 240 hour(s))  Novel Coronavirus, NAA ([facility])     Status: None   Collection Time: 10/02/19 12:00 AM   Specimen: Nasopharyngeal(NP) swabs in vial transport medium   NASOPHARYNGE  TESTING  Result Value Ref Range Status   SARS-CoV-2, NAA Not Detected Not Detected Final    Comment: This nucleic acid amplification test was developed and its performance characteristics determined by [employer]. Nucleic acid amplification tests include PCR and TMA. This test has not been FDA cleared or approved.  This test has been authorized by FDA under an Emergency Use Authorization (EUA). This test is only authorized for the duration of time the declaration that circumstances exist justifying the authorization of the emergency use of in vitro diagnostic tests for detection of SARS-CoV-2 virus and/or diagnosis of COVID-19 infection under section 564(b)(1) of the Act, 21 U.S.C. 161WRU-0(A360bbb-3(b) (1), unless the authorization is terminated or revoked sooner. When diagnostic testing is negative, the possibility of a false negative result should be considered in the context of a patient's recent exposures and the presence of clinical signs and symptoms consistent with COVID-19. An individual without symptoms of COVID-19 and who is not shedding SARS-CoV-2 virus would  expect to have a negative (not detected) result in this assay.      Radiological Exams on Admission: No results found.  EKG: Independently reviewed.  Sinus tachycardia with repeat EKG showing QTC of 505 ms.  Assessment/Plan Principal Problem:   Drug overdose Active Problems:   Attention deficit  hyperactivity disorder (ADHD)   History of alcohol abuse    1. Intentional drug overdose with suicidal ideation presently in acute delirium -likely medication patient could have taken his Benadryl.  Poison control at this time is requested to monitor in telemetry as needed Ativan for agitation check EKG every 4 hourly to make sure QTC not prolonged.  Look for any urinary retention and also hydrate patient. 2. Possible alcohol abuse on thiamine and Ativan CIWA protocol. 3. Suicidal ideation for which patient will need psychiatric consult when patient is more stable.  Presently on suicide precautions.   DVT prophylaxis: SCDs due to drug overdose. Code Status: Full code. Family Communication: Patient's mother. Disposition Plan: To be determined. Consults called: None. Admission status: Inpatient.   Eduard ClosArshad N Jessic Standifer MD [facility] Pager [phone].  If 7PM-7AM, please contact night-coverage [email or web address] Password [email or web address]  10/07/2019, 12:51 AM

## 2019-10-07 NOTE — Progress Notes (Signed)
New orders received: -Ordered EKG in physical chart- EKG machine was having issue w/ bracelet scan, may not show up in results review. -Bladder scanned for 419mLs. Pt currently too altered to follow commands to spontaneously void. Straight cathed per order for 617mLs.  -CIWA 19- given 3mg  Ativan per orders

## 2019-10-07 NOTE — Progress Notes (Signed)
1415 bladder scan revealed 672 cc.  MD notified and new orders received.   At 2.35 pm I&O cath and 600 removed from bladder.

## 2019-10-07 NOTE — Progress Notes (Signed)
CSW CSW acknowledges consult for substance abuse/education/resoruces. Patient is not appropriate for substance abuse educations/resources at this time.   CSW will continue to follow and provide resources when the patient is less agitated and mentation has improved.    Thurmond Butts, MSW, [place] Clinical Social Worker

## 2019-10-07 NOTE — Progress Notes (Addendum)
Pt arrived to 4E04 from ED. AOx3- unable to tell me date. Knows he's at [facility], states he is here because he "fell asleep for too long". Speech very incoherent. Quite fidgety and anxious w/ frequent attempts to get out of bed, but is easily redirectable. No pain. Ativan given for a CIWA of 15. HR 130s-140s. Attempted to update mother, left voicemail. Will continue to monitor.  Jaymes Graff, RN

## 2019-10-07 NOTE — ED Notes (Signed)
ED TO INPATIENT HANDOFF REPORT  ED Nurse Name and Phone #: Mickeal Skinnerhoebe, [phone]  S Name/Age/Gender Brett GaribaldiSeth P Sanford 28 y.o. male Room/Bed: 019C/019C  Code Status   Code Status: Full Code  Home/SNF/Other Home Patient oriented to: self Is this baseline? No   Triage Complete: Triage complete  Chief Complaint Drug overdose [T50.901A]  Triage Note [facility]  Pt found by mom on porch, unresponsive. Per mom and sister, pt sent a text message to sister stating "I love you, you will never see me again, I am going to take three handfuls of sleeping pills."  Per EMS, neither the police nor the mother could find any substance in the house.  Pt not speaking, reactive to touch.      Allergies Allergies  Allergen Reactions  . Benadryl [Diphenhydramine Hcl] Other (See Comments)    My pt hyper    Level of Care/Admitting Diagnosis ED Disposition    ED Disposition Condition Comment   Admit  [facility] [[id number]]  Level of Care: Progressive [102]  Admit to Progressive based on following criteria: MULTISYSTEM THREATS such as stable sepsis, metabolic/electrolyte imbalance with or without encephalopathy that is responding to early treatment.  Covid Evaluation: Asymptomatic Screening Protocol (No Symptoms)  Diagnosis: Drug overdose [202575]  Admitting Physician: Eduard ClosKAKRAKANDY, ARSHAD N [phone][[id number]]  Attending Physician: Eduard ClosKAKRAKANDY, ARSHAD N [[id number]]  PT Class (Do Not Modify): Observation [104]  PT Acc Code (Do Not Modify): Observation [10022]       B Medical/Surgery History Past Medical History:  Diagnosis Date  . ADD (attention deficit disorder)   . Drug abuse (HCC)   . Medical history non-contributory    Past Surgical History:  Procedure Laterality Date  . NO PAST SURGERIES       A IV Location/Drains/Wounds Patient Lines/Drains/Airways Status   Active Line/Drains/Airways    Name:   Placement date:   Placement time:    Site:   Days:   Peripheral IV 08/22/19 Right Antecubital   08/22/19    0045    Antecubital   46   Peripheral IV 10/06/19 Right Antecubital   10/06/19    --    Antecubital   1          Intake/Output Last 24 hours  Intake/Output Summary (Last 24 hours) at 10/07/2019 0131 Last data filed at 10/07/2019 0123 Gross per 24 hour  Intake 2000 ml  Output --  Net 2000 ml    Labs/Imaging Results for orders placed or performed during the hospital encounter of 10/06/19 (from the past 48 hour(s))  CBC with Differential     Status: None   Collection Time: 10/06/19  8:57 PM  Result Value Ref Range   WBC 6.2 4.0 - 10.5 K/uL   RBC 4.60 4.22 - 5.81 MIL/uL   Hemoglobin 14.5 13.0 - 17.0 g/dL   HCT 98.142.6 19.139.0 - 47.852.0 %   MCV 92.6 80.0 - 100.0 fL   MCH 31.5 26.0 - 34.0 pg   MCHC 34.0 30.0 - 36.0 g/dL   RDW 29.513.7 62.111.5 - 30.815.5 %   Platelets 245 150 - 400 K/uL   nRBC 0.0 0.0 - 0.2 %   Neutrophils Relative % 44 %   Neutro Abs 2.8 1.7 - 7.7 K/uL   Lymphocytes Relative 42 %   Lymphs Abs 2.6 0.7 - 4.0 K/uL   Monocytes Relative 9 %   Monocytes Absolute 0.6 0.1 - 1.0 K/uL   Eosinophils Relative 3 %  Eosinophils Absolute 0.2 0.0 - 0.5 K/uL   Basophils Relative 1 %   Basophils Absolute 0.1 0.0 - 0.1 K/uL   Immature Granulocytes 1 %   Abs Immature Granulocytes 0.04 0.00 - 0.07 K/uL    Comment: Performed at [facility], [address], [place], [place] [address]  Comprehensive metabolic panel     Status: Abnormal   Collection Time: 10/06/19  8:57 PM  Result Value Ref Range   Sodium 142 135 - 145 mmol/L   Potassium 3.7 3.5 - 5.1 mmol/L   Chloride 105 98 - 111 mmol/L   CO2 25 22 - 32 mmol/L   Glucose, Bld 66 (L) 70 - 99 mg/dL   BUN 13 6 - 20 mg/dL   Creatinine, Ser 0.94 0.61 - 1.24 mg/dL   Calcium 8.8 (L) 8.9 - 10.3 mg/dL   Total Protein 6.6 6.5 - 8.1 g/dL   Albumin 3.9 3.5 - 5.0 g/dL   AST 21 15 - 41 U/L   ALT 17 0 - 44 U/L   Alkaline Phosphatase 64 38 - 126 U/L   Total Bilirubin 0.8 0.3  - 1.2 mg/dL   GFR calc non Af Amer >60 >60 mL/min   GFR calc Af Amer >60 >60 mL/min   Anion gap 12 5 - 15    Comment: Performed at [facility], [address], [place], [place] [address]  Salicylate level     Status: Abnormal   Collection Time: 10/06/19  8:57 PM  Result Value Ref Range   Salicylate Lvl <7.0 (L) 7.0 - 30.0 mg/dL    Comment: Performed at [facility], [address] [place], [place] [address]  Acetaminophen level     Status: Abnormal   Collection Time: 10/06/19  8:57 PM  Result Value Ref Range   Acetaminophen (Tylenol), Serum <10 (L) 10 - 30 ug/mL    Comment: (NOTE) Therapeutic concentrations vary significantly. A range of 10-30 ug/mL  may be an effective concentration for many patients. However, some  are best treated at concentrations outside of this range. Acetaminophen concentrations >150 ug/mL at 4 hours after ingestion  and >50 ug/mL at 12 hours after ingestion are often associated with  toxic reactions. Performed at [facility], [address] [place], [place] [address]   Ethanol     Status: Abnormal   Collection Time: 10/06/19  8:57 PM  Result Value Ref Range   Alcohol, Ethyl (B) 272 (H) <10 mg/dL    Comment: (NOTE) Lowest detectable limit for serum alcohol is 10 mg/dL. For medical purposes only. Performed at [facility], [address], [place], [place] [address]   Rapid urine drug screen (hospital performed)     Status: None   Collection Time: 10/06/19  8:57 PM  Result Value Ref Range   Opiates NONE DETECTED NONE DETECTED   Cocaine NONE DETECTED NONE DETECTED   Benzodiazepines NONE DETECTED NONE DETECTED   Amphetamines NONE DETECTED NONE DETECTED   Tetrahydrocannabinol NONE DETECTED NONE DETECTED   Barbiturates NONE DETECTED NONE DETECTED    Comment: (NOTE) DRUG SCREEN FOR MEDICAL PURPOSES ONLY.  IF CONFIRMATION IS NEEDED FOR ANY PURPOSE, NOTIFY LAB WITHIN 5 DAYS. LOWEST DETECTABLE LIMITS FOR URINE DRUG  SCREEN Drug Class                     Cutoff (ng/mL) Amphetamine and metabolites    1000 Barbiturate and metabolites    200 Benzodiazepine  263 Tricyclics and metabolites     300 Opiates and metabolites        300 Cocaine and metabolites        300 THC                            50 Performed at [facility], [place] [address], [place], [place] [address]   CBG monitoring, ED (now and then every hour for 3 hours)     Status: Abnormal   Collection Time: 10/07/19 12:03 AM  Result Value Ref Range   Glucose-Capillary 100 (H) 70 - 99 mg/dL  Magnesium     Status: Abnormal   Collection Time: 10/07/19 12:20 AM  Result Value Ref Range   Magnesium 1.6 (L) 1.7 - 2.4 mg/dL    Comment: Performed at [facility] [address] [place],  [address]   No results found.  Pending Labs Unresulted Labs (From admission, onward)    Start     Ordered   10/06/19 2106  SARS CORONAVIRUS 2 (TAT 6-24 HRS) Nasopharyngeal Nasopharyngeal Swab  (Tier 3 (TAT 6-24 hrs))  Once,   STAT    Question Answer Comment  Is this test for diagnosis or screening Screening   Symptomatic for COVID-19 as defined by CDC No   Hospitalized for COVID-19 No   Admitted to ICU for COVID-19 No   Previously tested for COVID-19 Yes   Resident in a congregate (group) care setting No   Employed in healthcare setting No      10/06/19 2105          Vitals/Pain Today's Vitals   10/06/19 2145 10/06/19 2243 10/06/19 2257 10/07/19 0033  BP: (!) 130/110  138/82 (!) 148/92  Pulse: (!) 112 (!) 131 (!) 154 (!) 129  Resp: 19 20 (!) 33 (!) 24  Temp:      SpO2: 100% 100% 100% 95%  PainSc:        Isolation Precautions No active isolations  Medications Medications  potassium chloride 10 mEq in 100 mL IVPB (10 mEq Intravenous New Bag/Given 10/07/19 0118)  0.9 %  sodium chloride infusion ( Intravenous New Bag/Given 10/07/19 0122)  LORazepam (ATIVAN) tablet 1-4 mg (has no administration in time range)     Or  LORazepam (ATIVAN) injection 1-4 mg (has no administration in time range)  thiamine tablet 100 mg (has no administration in time range)    Or  thiamine (B-1) injection 100 mg (has no administration in time range)  folic acid (FOLVITE) tablet 1 mg (has no administration in time range)  multivitamin with minerals tablet 1 tablet (has no administration in time range)  LORazepam (ATIVAN) injection 0-4 mg (has no administration in time range)    Followed by  LORazepam (ATIVAN) injection 0-4 mg (has no administration in time range)  dextrose 50 % solution 50 mL (50 mLs Intravenous Given 10/06/19 2250)  sodium chloride 0.9 % bolus 1,000 mL (0 mLs Intravenous Stopped 10/07/19 0123)  LORazepam (ATIVAN) injection 1 mg (1 mg Intravenous Given 10/06/19 2339)  sodium chloride 0.9 % bolus 1,000 mL (0 mLs Intravenous Stopped 10/07/19 0054)  LORazepam (ATIVAN) injection 1 mg (1 mg Intravenous Given 10/07/19 0000)    Mobility walks     Focused Assessments Neuro Assessment Handoff:  Swallow screen pass?          Neuro Assessment:   Neuro Checks:      Last Documented NIHSS Modified  Score:   Has TPA been given? No If patient is a Neuro Trauma and patient is going to OR before floor call report to 4N Charge nurse: [phone] or [phone]     R Recommendations: See Admitting Provider Note  Report given to:   Additional Notes:

## 2019-10-07 NOTE — ED Provider Notes (Signed)
Patient here with drug overdose.  Suspected sleeping pills.    Physical Exam  BP 138/82   Pulse (!) 154   Temp 98.1 F (36.7 C)   Resp (!) 33   SpO2 100%   Physical Exam Vitals and nursing note reviewed.  Constitutional:      Appearance: He is well-developed.  HENT:     Head: Normocephalic and atraumatic.  Eyes:     Conjunctiva/sclera: Conjunctivae normal.  Cardiovascular:     Rate and Rhythm: Regular rhythm. Tachycardia present.     Heart sounds: No murmur.  Pulmonary:     Effort: Pulmonary effort is normal. No respiratory distress.     Breath sounds: Normal breath sounds.  Abdominal:     General: There is no distension.  Musculoskeletal:     Cervical back: Neck supple.  Skin:    General: Skin is warm and dry.  Neurological:     Mental Status: He is alert. He is disoriented.  Psychiatric:     Comments: Nonsensical speech     ED Course/Procedures     .Critical Care Performed by: Montine Circle, PA-C Authorized by: Montine Circle, PA-C   Critical care provider statement:    Critical care time (minutes):  33   Critical care was necessary to treat or prevent imminent or life-threatening deterioration of the following conditions:  Toxidrome   Critical care was time spent personally by me on the following activities:  Discussions with consultants, evaluation of patient's response to treatment, examination of patient, ordering and performing treatments and interventions, ordering and review of laboratory studies, ordering and review of radiographic studies, pulse oximetry, re-evaluation of patient's condition, obtaining history from patient or surrogate and review of old charts    MDM  Patient reassessed by me.  He is muttering nonsensically.  He does not respond to any my questions.  His heart rate is trending up despite a couple of doses of Ativan.  Heart rate is now in the 150s.  QTC is 520.  Patient getting fluids.  Concern for overdose on sleeping pills,  but he also takes Vyvanse Prozac.  UDS negative for amphetamines.  Discussed with poison control again.  Recommend optimizing potassium and magnesium.  Observation overnight in the hospital.  We will consult hospitalist for admission.        Montine Circle, PA-C 10/07/19 4174    Veryl Speak, MD 10/07/19 [phone]

## 2019-10-07 NOTE — Progress Notes (Signed)
Mother came by today and brought a bottle of pills she found in his bathroom that were previously in her bathroom.  Reportedly his friend said he took 3 hand fulls.  [facility] brand "sleep-aid" Diphenhydramine HCL 50mg 

## 2019-10-07 NOTE — Progress Notes (Addendum)
Patient seen and examined personally, I reviewed the chart, history and physical and admission note, done by admitting physician this morning and agree with the same with following addendum.  Please refer to the morning admission note for more detailed plan of care.  Briefly,  28 y.o.m w/ depression and ADD,not been taking his meds,likely drinks alcohol every day was found to be unresponsive in his house on the porch by his mother, few mins prior to that he had texted his sister " goodbye" and mother reported her Benadryl bottle which was full has been missing in her medication cupboard in the bathroom and she did see him going to the bathroom.   In ER-agitated confused initially mildly sedated.  EKG was sinus tachycardia with QTC of 520 ms.  Urine drug screen was negative alcohol level was 272 acetaminophen level and salicylates were negative.  Poison control at this time was contacted and primary concern is for Benadryl overdose and requested to check EKG every 4-6 hours to make sure the QTC is not prolonging and also check metabolic panel closely for potassium and magnesium levels to be kept on the higher side.  Check bladder scan to make sure patient is not retaining urine and if it is then may need an out of cath.  Ativan for agitation.  A repeat EKG showed QTC was showing decreased elongation of 505 ms.  Patient was afebrile Covid test was negative.  Patient on my exam was completely confused and hallucinating.  Chest x-ray unremarkable  This am had I/o with 600 ml urine removed. HR in 121, Bp stable, on RM spo2 100%. Alert,awake, fidgety, able to tell me his name,moving all his extremities-non focal. Sitter at bedside. Tongue dry, on RA, lungs are clears  EKG in chart- Lft,cbc,bmp stable UDS neg.  Issues Drug OD, intentional and with delerium Acute toxic/metabolic encephalopathy- he is exhibiting beandryl OD symptoms and signs- cont benzo prn, Prolonged QTC-repeat EKG this am is 479  improved. Tachycardia sinus-HR improving at 110s now. SI Possible etoh abuse on ciwa Hypomagnesemia 1.5- repelting iv. Urine retention form benadryl OD- cont baldder scan and I/o cath  Keep current plan, ivf-increase to 125 ml/hr and will give 500 ml bolus to help tachycardia, keep npo, cont ciwa, supportive care, one to one and suicide precautions. Discussed with the nursing staff regarding plan of care. Patient need inpatient level of care and expected length of stay more than 2 midnights change the patient inpatient status. RN informed that Poison control called and spoke w RN- advised to cotinue benzo He is retaining urine again and ordered I.o cath w q6h bladder scan. Will need psych consult once mentation better. He will not be allowed to leave AMA

## 2019-10-07 NOTE — Progress Notes (Signed)
   10/07/19 1200  Clinical Encounter Type  Visited With Patient;Health care provider  Visit Type Initial  Referral From Nurse  Consult/Referral To Chaplain  Spiritual Encounters  Spiritual Needs Emotional  Stress Factors  Patient Stress Factors None identified  Family Stress Factors None identified   Chaplain visited with Diona Foley, and RN. Alta was very agitated and unable to carry on a coherent conversation. Occasionally Lula is able to make a statement like, "I am having chills..." "I am falling...." Taeshaun was calling out for "Hailey." Chaplain brought Marcelles a prayer shawl (called it a blanket), and a teddy bear. Cayson responded well to receiving both. Chaplains remain available for support as needs arise.   Chaplain Resident, [place] [phone] on-call

## 2019-10-08 ENCOUNTER — Encounter (HOSPITAL_COMMUNITY): Payer: Self-pay | Admitting: Internal Medicine

## 2019-10-08 DIAGNOSIS — F1011 Alcohol abuse, in remission: Secondary | ICD-10-CM

## 2019-10-08 DIAGNOSIS — T1491XA Suicide attempt, initial encounter: Secondary | ICD-10-CM

## 2019-10-08 DIAGNOSIS — T50902A Poisoning by unspecified drugs, medicaments and biological substances, intentional self-harm, initial encounter: Secondary | ICD-10-CM

## 2019-10-08 DIAGNOSIS — F10920 Alcohol use, unspecified with intoxication, uncomplicated: Secondary | ICD-10-CM

## 2019-10-08 LAB — GLUCOSE, CAPILLARY
Glucose-Capillary: 69 mg/dL — ABNORMAL LOW (ref 70–99)
Glucose-Capillary: 91 mg/dL (ref 70–99)
Glucose-Capillary: 72 mg/dL (ref 70–99)
Glucose-Capillary: 84 mg/dL (ref 70–99)
Glucose-Capillary: 118 mg/dL — ABNORMAL HIGH (ref 70–99)
Glucose-Capillary: 127 mg/dL — ABNORMAL HIGH (ref 70–99)
Glucose-Capillary: 135 mg/dL — ABNORMAL HIGH (ref 70–99)
Glucose-Capillary: 96 mg/dL (ref 70–99)

## 2019-10-08 LAB — BASIC METABOLIC PANEL
Anion gap: 11 (ref 5–15)
BUN: 8 mg/dL (ref 6–20)
CO2: 23 mmol/L (ref 22–32)
Calcium: 9 mg/dL (ref 8.9–10.3)
Chloride: 106 mmol/L (ref 98–111)
Creatinine, Ser: 0.81 mg/dL (ref 0.61–1.24)
GFR calc Af Amer: >60 mL/min (ref >60)
GFR calc non Af Amer: >60 mL/min (ref >60)
Glucose, Bld: 85 mg/dL (ref 70–99)
Potassium: 3.6 mmol/L (ref 3.5–5.1)
Sodium: 140 mmol/L (ref 135–145)

## 2019-10-08 LAB — MAGNESIUM: Magnesium: 1.9 mg/dL (ref 1.7–2.4)

## 2019-10-08 MED ORDER — FLUOXETINE HCL 20 MG PO CAPS
40.0000 mg | ORAL_CAPSULE | Freq: Every day | ORAL | Status: DC
  Administered 2019-10-08 – 2019-10-10 (×3): 40 mg via ORAL
  Filled 2019-10-08 (×3): qty 2

## 2019-10-08 MED ORDER — NICOTINE 14 MG/24HR TD PT24
14.0000 mg | MEDICATED_PATCH | Freq: Every day | TRANSDERMAL | Status: DC
  Administered 2019-10-08 – 2019-10-10 (×3): 14 mg via TRANSDERMAL
  Filled 2019-10-08 (×3): qty 1

## 2019-10-08 NOTE — Progress Notes (Signed)
PROGRESS NOTE    Brett Sanford  IRJ:[id number] DOB: 21-Apr-1991 DOA: 10/06/2019 PCP: Everrett Coombe, DO     Brief Narrative:  Brett Sanford is a 28 y.o. male w/ depression and ADD, has not been taking his meds, likely drinks alcohol every day, was found to be unresponsive in his house on the porch by his mother. Apparently, a few minutes prior to that, he had texted his sister "goodbye" and mother reported her Benadryl bottle which was full has been missing in her medication cupboard in the bathroom and she did see him going to the bathroom.  Poison control was contacted for concern for intentional Benadryl overdose.  New events last 24 hours / Subjective: Patient seen with sitter at bedside.  Alert and awake, cooperative on examination.  He states of the last thing he remember is being in an argument with his mother, taking 3 big handfuls of sleeping medicine with intent of suicidal attempt.  He had previously attempted suicide in the past.  Currently denies suicidal ideation, thinks that he is being discharged home today.  Assessment & Plan:   Principal Problem:   Drug overdose Active Problems:   Attention deficit hyperactivity disorder (ADHD)   History of alcohol abuse    Intentional drug overdose, suicidal attempt -Alert and awake, cooperative on examination -Discontinue restraints -Sitter at bedside -Appreciate poison control -Psych consult  Alcohol abuse -CIWA -Folic acid, thiamine  Tobacco abuse -Nicotine patch   DVT prophylaxis: SCD Code Status: Full code Family Communication: No family at bedside Disposition Plan: Pending psych consult   Consultants:   Psych  Procedures:   None  Antimicrobials:  Anti-infectives (From admission, onward)   None        Objective: Vitals:   10/08/19 0226 10/08/19 0525 10/08/19 0800 10/08/19 1219  BP:  (!) 143/85 (!) 141/89 (!) 148/89  Pulse: 82 91 92 98  Resp:  (!) 22 17 19   Temp:  97.6 F (36.4 C) 98.7 F  (37.1 C) 99.2 F (37.3 C)  TempSrc:  Axillary Oral Oral  SpO2:  100% 100% 99%  Weight:      Height:        Intake/Output Summary (Last 24 hours) at 10/08/2019 1244 Last data filed at 10/07/2019 2008 Gross per 24 hour  Intake --  Output 400 ml  Net -400 ml   Filed Weights   10/07/19 0218  Weight: 85.9 kg    Examination:  General exam: Appears calm and comfortable  Respiratory system: Clear to auscultation. Respiratory effort normal. No respiratory distress. No conversational dyspnea.  Cardiovascular system: S1 & S2 heard, RRR. No murmurs. No pedal edema. Gastrointestinal system: Abdomen is nondistended, soft and nontender. Normal bowel sounds heard. Central nervous system: Alert and oriented. No focal neurological deficits. Speech clear.  Extremities: Symmetric in appearance  Skin: No rashes, lesions or ulcers on exposed skin  Psychiatry: Stable   Data Reviewed: I have personally reviewed following labs and imaging studies  CBC: Recent Labs  Lab 10/02/19 1830 10/06/19 2057 10/07/19 0418  WBC 8.3 6.2 9.0  NEUTROABS  --  2.8 6.9  HGB 15.6 14.5 13.2  HCT 45.7 42.6 38.2*  MCV 91.2 92.6 91.2  PLT 350 245 236   Basic Metabolic Panel: Recent Labs  Lab 10/02/19 1830 10/06/19 2057 10/07/19 0020 10/07/19 0418 10/08/19 0827  NA 140 142  --  142 140  K 4.3 3.7  --  3.8 3.6  CL 103 105  --  107 106  CO2 22 25  --  22 23  GLUCOSE 98 66*  --  86 85  BUN 17 13  --  9 8  CREATININE 1.09 0.95  --  0.86 0.81  CALCIUM 9.1 8.8*  --  8.4* 9.0  MG  --   --  1.6* 1.5* 1.9   GFR: Estimated Creatinine Clearance: 146.1 mL/min (by C-G formula based on SCr of 0.81 mg/dL). Liver Function Tests: Recent Labs  Lab 10/06/19 2057 10/07/19 0418  AST 21 23  ALT 17 16  ALKPHOS 64 54  BILITOT 0.8 1.1  PROT 6.6 6.3*  ALBUMIN 3.9 3.8   No results for input(s): LIPASE, AMYLASE in the last 168 hours. Recent Labs  Lab 10/07/19 0418  AMMONIA 13   Coagulation Profile: Recent  Labs  Lab 10/07/19 0418  INR 1.0   Cardiac Enzymes: No results for input(s): CKTOTAL, CKMB, CKMBINDEX, TROPONINI in the last 168 hours. BNP (last 3 results) No results for input(s): PROBNP in the last 8760 hours. HbA1C: No results for input(s): HGBA1C in the last 72 hours. CBG: Recent Labs  Lab 10/07/19 1952 10/07/19 2350 10/08/19 0524 10/08/19 0808 10/08/19 1212  GLUCAP 94 91 72 84 118*   Lipid Profile: No results for input(s): CHOL, HDL, LDLCALC, TRIG, CHOLHDL, LDLDIRECT in the last 72 hours. Thyroid Function Tests: No results for input(s): TSH, T4TOTAL, FREET4, T3FREE, THYROIDAB in the last 72 hours. Anemia Panel: No results for input(s): VITAMINB12, FOLATE, FERRITIN, TIBC, IRON, RETICCTPCT in the last 72 hours. Sepsis Labs: No results for input(s): PROCALCITON, LATICACIDVEN in the last 168 hours.  Recent Results (from the past 240 hour(s))  Novel Coronavirus, NAA ([facility])     Status: None   Collection Time: 10/02/19 12:00 AM   Specimen: Nasopharyngeal(NP) swabs in vial transport medium   NASOPHARYNGE  TESTING  Result Value Ref Range Status   SARS-CoV-2, NAA Not Detected Not Detected Final    Comment: This nucleic acid amplification test was developed and its performance characteristics determined by [employer]. Nucleic acid amplification tests include PCR and TMA. This test has not been FDA cleared or approved. This test has been authorized by FDA under an Emergency Use Authorization (EUA). This test is only authorized for the duration of time the declaration that circumstances exist justifying the authorization of the emergency use of in vitro diagnostic tests for detection of SARS-CoV-2 virus and/or diagnosis of COVID-19 infection under section 564(b)(1) of the Act, 21 U.S.C. 308MVH-8(I) (1), unless the authorization is terminated or revoked sooner. When diagnostic testing is negative, the possibility of a false negative result should be considered in  the context of a patient's recent exposures and the presence of clinical signs and symptoms consistent with COVID-19. An individual without symptoms of COVID-19 and who is not shedding SARS-CoV-2 virus would  expect to have a negative (not detected) result in this assay.   SARS CORONAVIRUS 2 (TAT 6-24 HRS) Nasopharyngeal Nasopharyngeal Swab     Status: None   Collection Time: 10/06/19  9:16 PM   Specimen: Nasopharyngeal Swab  Result Value Ref Range Status   SARS Coronavirus 2 NEGATIVE NEGATIVE Final    Comment: (NOTE) SARS-CoV-2 target nucleic acids are NOT DETECTED. The SARS-CoV-2 RNA is generally detectable in upper and lower respiratory specimens during the acute phase of infection. Negative results do not preclude SARS-CoV-2 infection, do not rule out co-infections with other pathogens, and should not be used as the sole basis for treatment or other patient management decisions. Negative results  must be combined with clinical observations, patient history, and epidemiological information. The expected result is Negative. Fact Sheet for Patients: HairSlick.nohttps://www.fda.gov/media/138098/download Fact Sheet for Healthcare Providers: quierodirigir.comhttps://www.fda.gov/media/138095/download This test is not yet approved or cleared by the [place] FDA and  has been authorized for detection and/or diagnosis of SARS-CoV-2 by FDA under an Emergency Use Authorization (EUA). This EUA will remain  in effect (meaning this test can be used) for the duration of the COVID-19 declaration under Section 56 4(b)(1) of the Act, 21 U.S.C. section 360bbb-3(b)(1), unless the authorization is terminated or revoked sooner. Performed at [facility], [address] [place], [place] [address]       Radiology Studies: No results found.    Scheduled Meds: . folic acid  1 mg Oral Daily  . LORazepam  0-4 mg Intravenous Q6H   Followed by  . [START ON 10/09/2019] LORazepam  0-4 mg Intravenous Q12H  .  multivitamin with minerals  1 tablet Oral Daily  . nicotine  14 mg Transdermal Daily  . thiamine  100 mg Oral Daily   Or  . thiamine  100 mg Intravenous Daily   Continuous Infusions:   LOS: 1 day      Time spent: 35 minutes   Noralee StainJennifer Hector Venne, DO [facility] 10/08/2019, 12:44 PM   Available via Epic secure chat 7am-7pm After these hours, please refer to coverage provider listed on [email or web address]

## 2019-10-08 NOTE — Consult Note (Signed)
Telepsych Consultation   Reason for Consult:  Overdose Referring Physician:  Hinton Lovely Location of Patient: [facility] Location of Provider: [facility]  Patient Identification: Brett Sanford MRN:  [id number] Principal Diagnosis: Drug overdose Diagnosis:  Principal Problem:   Drug overdose Active Problems:   Attention deficit hyperactivity disorder (ADHD)   History of alcohol abuse   Total Time spent with patient: 30 minutes  Subjective:   Brett Sanford is a 28 y.o. male patient admitted after overdose.  HPI:  Brett Sanford, 28 y.o., male patient seen via tele psych by this provider, consulted with Dr. Lucianne Muss; and chart reviewed on 10/08/19.  On evaluation Brett Sanford reports he was brought to the hospital because he was found passed out on his mother's porch.  Patient states he does not remember what happened prior to being found to waking up in the hospital after passing out.  Patient denies suicidal/self-harm/homicidal ideations, psychosis, paranoia.  Patient also denies prior history of suicide attempt.  Patient acknowledges that he has substance use issues is being treated at the [facility] states he has been clean for several months until relapsing with alcohol.  Patient states he was last psychiatrically hospitalized at [facility] a [facility] ago.  Patient reports that he is drinking half to 1 pint of alcohol weekly.  Patient reports that he is in the intensive outpatient program for substance use does not feel that he needs any other services at this time.  When asked about overdose patient denies that he took an overdose.  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  Patient reporting recent stressors as being that he is the primary caregiver of his mother and that he has been out of work since August.  Patient states he has been compliant with his medication and that he is taking  Prozac 20 mg but feels that it may need to be increased. During evaluation Brett Sanford is alert/oriented x 4; calm/cooperative; and mood is congruent with affect.  He does not appear to be responding to internal/external stimuli or delusional thoughts.  Patient denies suicidal/self-harm/homicidal ideation, psychosis, and paranoia.  Patient answered question appropriately.  Patient possibly minimizing his actions related to overdose.  Mother and sister reporting patient overdosed on Benadryl and sent a text message telling them goodbye.   Past Psychiatric History: Major depressive disorder, substance use disorder, alcohol use disorder.  Risk to Self: Is patient at risk for suicide?: Yes Risk to Others:   Prior Inpatient Therapy:   Prior Outpatient Therapy:    Past Medical History:  Past Medical History:  Diagnosis Date  . ADD (attention deficit disorder)   . Drug abuse (HCC)   . Medical history non-contributory     Past Surgical History:  Procedure Laterality Date  . NO PAST SURGERIES     Family History:  Family History  Family history unknown: Yes   Family Psychiatric  History: Denies Social History:  Social History   Substance and Sexual Activity  Alcohol Use Yes   Comment: hx of alcoholism     Social History   Substance and Sexual Activity  Drug Use Yes  . Types: Marijuana    Social History   Socioeconomic History  . Marital status: Single    Spouse name: Not on file  . Number of children: Not on file  . Years of education: Not  on file  . Highest education level: Not on file  Occupational History  . Not on file  Tobacco Use  . Smoking status: Current Every Day Smoker  . Smokeless tobacco: Never Used  Substance and Sexual Activity  . Alcohol use: Yes    Comment: hx of alcoholism  . Drug use: Yes    Types: Marijuana  . Sexual activity: Not on file  Other Topics Concern  . Not on file  Social History Narrative  . Not on file   Social Determinants of  Health   Financial Resource Strain:   . Difficulty of Paying Living Expenses: Not on file  Food Insecurity:   . Worried About Charity fundraiser in the Last Year: Not on file  . Ran Out of Food in the Last Year: Not on file  Transportation Needs:   . Lack of Transportation (Medical): Not on file  . Lack of Transportation (Non-Medical): Not on file  Physical Activity:   . Days of Exercise per Week: Not on file  . Minutes of Exercise per Session: Not on file  Stress:   . Feeling of Stress : Not on file  Social Connections:   . Frequency of Communication with Friends and Family: Not on file  . Frequency of Social Gatherings with Friends and Family: Not on file  . Attends Religious Services: Not on file  . Active Member of Clubs or Organizations: Not on file  . Attends Archivist Meetings: Not on file  . Marital Status: Not on file   Additional Social History:    Allergies:   Allergies  Allergen Reactions  . Benadryl [Diphenhydramine Hcl] Other (See Comments)    Caused hyperactivity    Labs:  Results for orders placed or performed during the hospital encounter of 10/06/19 (from the past 48 hour(s))  CBC with Differential     Status: None   Collection Time: 10/06/19  8:57 PM  Result Value Ref Range   WBC 6.2 4.0 - 10.5 K/uL   RBC 4.60 4.22 - 5.81 MIL/uL   Hemoglobin 14.5 13.0 - 17.0 g/dL   HCT 42.6 39.0 - 52.0 %   MCV 92.6 80.0 - 100.0 fL   MCH 31.5 26.0 - 34.0 pg   MCHC 34.0 30.0 - 36.0 g/dL   RDW 13.7 11.5 - 15.5 %   Platelets 245 150 - 400 K/uL   nRBC 0.0 0.0 - 0.2 %   Neutrophils Relative % 44 %   Neutro Abs 2.8 1.7 - 7.7 K/uL   Lymphocytes Relative 42 %   Lymphs Abs 2.6 0.7 - 4.0 K/uL   Monocytes Relative 9 %   Monocytes Absolute 0.6 0.1 - 1.0 K/uL   Eosinophils Relative 3 %   Eosinophils Absolute 0.2 0.0 - 0.5 K/uL   Basophils Relative 1 %   Basophils Absolute 0.1 0.0 - 0.1 K/uL   Immature Granulocytes 1 %   Abs Immature Granulocytes 0.04 0.00 -  0.07 K/uL    Comment: Performed at [facility], [address], [place], [place] [address]  Comprehensive metabolic panel     Status: Abnormal   Collection Time: 10/06/19  8:57 PM  Result Value Ref Range   Sodium 142 135 - 145 mmol/L   Potassium 3.7 3.5 - 5.1 mmol/L   Chloride 105 98 - 111 mmol/L   CO2 25 22 - 32 mmol/L   Glucose, Bld 66 (L) 70 - 99 mg/dL   BUN 13 6 - 20  mg/dL   Creatinine, Ser 1.61 0.61 - 1.24 mg/dL   Calcium 8.8 (L) 8.9 - 10.3 mg/dL   Total Protein 6.6 6.5 - 8.1 g/dL   Albumin 3.9 3.5 - 5.0 g/dL   AST 21 15 - 41 U/L   ALT 17 0 - 44 U/L   Alkaline Phosphatase 64 38 - 126 U/L   Total Bilirubin 0.8 0.3 - 1.2 mg/dL   GFR calc non Af Amer >60 >60 mL/min   GFR calc Af Amer >60 >60 mL/min   Anion gap 12 5 - 15    Comment: Performed at [facility], [address], [place], [place] [address]  Salicylate level     Status: Abnormal   Collection Time: 10/06/19  8:57 PM  Result Value Ref Range   Salicylate Lvl <7.0 (L) 7.0 - 30.0 mg/dL    Comment: Performed at [facility], [address] [place], [place] [address]  Acetaminophen level     Status: Abnormal   Collection Time: 10/06/19  8:57 PM  Result Value Ref Range   Acetaminophen (Tylenol), Serum <10 (L) 10 - 30 ug/mL    Comment: (NOTE) Therapeutic concentrations vary significantly. A range of 10-30 ug/mL  may be an effective concentration for many patients. However, some  are best treated at concentrations outside of this range. Acetaminophen concentrations >150 ug/mL at 4 hours after ingestion  and >50 ug/mL at 12 hours after ingestion are often associated with  toxic reactions. Performed at [facility], [address], [place], [place] [address]   Ethanol     Status: Abnormal   Collection Time: 10/06/19  8:57 PM  Result Value Ref Range   Alcohol, Ethyl (B) 272 (H) <10 mg/dL    Comment: (NOTE) Lowest detectable limit for serum alcohol is 10 mg/dL. For medical purposes  only. Performed at [facility], [address] [place], [place] [address]   Rapid urine drug screen (hospital performed)     Status: None   Collection Time: 10/06/19  8:57 PM  Result Value Ref Range   Opiates NONE DETECTED NONE DETECTED   Cocaine NONE DETECTED NONE DETECTED   Benzodiazepines NONE DETECTED NONE DETECTED   Amphetamines NONE DETECTED NONE DETECTED   Tetrahydrocannabinol NONE DETECTED NONE DETECTED   Barbiturates NONE DETECTED NONE DETECTED    Comment: (NOTE) DRUG SCREEN FOR MEDICAL PURPOSES ONLY.  IF CONFIRMATION IS NEEDED FOR ANY PURPOSE, NOTIFY LAB WITHIN 5 DAYS. LOWEST DETECTABLE LIMITS FOR URINE DRUG SCREEN Drug Class                     Cutoff (ng/mL) Amphetamine and metabolites    1000 Barbiturate and metabolites    200 Benzodiazepine                 200 Tricyclics and metabolites     300 Opiates and metabolites        300 Cocaine and metabolites        300 THC                            50 Performed at [facility], [address] [place], [place] [address]   SARS CORONAVIRUS 2 (TAT 6-24 HRS) Nasopharyngeal Nasopharyngeal Swab     Status: None   Collection Time: 10/06/19  9:16 PM   Specimen: Nasopharyngeal Swab  Result Value Ref Range   SARS Coronavirus 2  NEGATIVE NEGATIVE    Comment: (NOTE) SARS-CoV-2 target nucleic acids are NOT DETECTED. The SARS-CoV-2 RNA is generally detectable in upper and lower respiratory specimens during the acute phase of infection. Negative results do not preclude SARS-CoV-2 infection, do not rule out co-infections with other pathogens, and should not be used as the sole basis for treatment or other patient management decisions. Negative results must be combined with clinical observations, patient history, and epidemiological information. The expected result is Negative. Fact Sheet for Patients: HairSlick.no Fact Sheet for Healthcare  Providers: quierodirigir.com This test is not yet approved or cleared by the [place] FDA and  has been authorized for detection and/or diagnosis of SARS-CoV-2 by FDA under an Emergency Use Authorization (EUA). This EUA will remain  in effect (meaning this test can be used) for the duration of the COVID-19 declaration under Section 56 4(b)(1) of the Act, 21 U.S.C. section 360bbb-3(b)(1), unless the authorization is terminated or revoked sooner. Performed at [facility], [address] [place], [place] [address]   CBG monitoring, ED (now and then every hour for 3 hours)     Status: Abnormal   Collection Time: 10/07/19 12:03 AM  Result Value Ref Range   Glucose-Capillary 100 (H) 70 - 99 mg/dL  Magnesium     Status: Abnormal   Collection Time: 10/07/19 12:20 AM  Result Value Ref Range   Magnesium 1.6 (L) 1.7 - 2.4 mg/dL    Comment: Performed at [facility], [address] [place], [place] [address]  Glucose, capillary     Status: None   Collection Time: 10/07/19  4:09 AM  Result Value Ref Range   Glucose-Capillary 83 70 - 99 mg/dL  Hepatic function panel     Status: Abnormal   Collection Time: 10/07/19  4:18 AM  Result Value Ref Range   Total Protein 6.3 (L) 6.5 - 8.1 g/dL   Albumin 3.8 3.5 - 5.0 g/dL   AST 23 15 - 41 U/L   ALT 16 0 - 44 U/L   Alkaline Phosphatase 54 38 - 126 U/L   Total Bilirubin 1.1 0.3 - 1.2 mg/dL   Bilirubin, Direct 0.2 0.0 - 0.2 mg/dL   Indirect Bilirubin 0.9 0.3 - 0.9 mg/dL    Comment: Performed at [facility], [address] [place], [place] [address]  Basic metabolic panel     Status: Abnormal   Collection Time: 10/07/19  4:18 AM  Result Value Ref Range   Sodium 142 135 - 145 mmol/L   Potassium 3.8 3.5 - 5.1 mmol/L   Chloride 107 98 - 111 mmol/L   CO2 22 22 - 32 mmol/L   Glucose, Bld 86 70 - 99 mg/dL   BUN 9 6 - 20 mg/dL   Creatinine, Ser 5.78 0.61 - 1.24 mg/dL   Calcium 8.4 (L) 8.9 - 10.3 mg/dL    GFR calc non Af Amer >60 >60 mL/min   GFR calc Af Amer >60 >60 mL/min   Anion gap 13 5 - 15    Comment: Performed at [facility], [address], [place], [place] [address]  CBC with Differential/Platelet     Status: Abnormal   Collection Time: 10/07/19  4:18 AM  Result Value Ref Range   WBC 9.0 4.0 - 10.5 K/uL   RBC 4.19 (L) 4.22 - 5.81 MIL/uL   Hemoglobin 13.2 13.0 - 17.0 g/dL   HCT 95.2 (L) 84.1 - 32.4 %   MCV 91.2 80.0 -  100.0 fL   MCH 31.5 26.0 - 34.0 pg   MCHC 34.6 30.0 - 36.0 g/dL   RDW 16.113.7 09.611.5 - 04.515.5 %   Platelets 236 150 - 400 K/uL   nRBC 0.0 0.0 - 0.2 %   Neutrophils Relative % 75 %   Neutro Abs 6.9 1.7 - 7.7 K/uL   Lymphocytes Relative 17 %   Lymphs Abs 1.5 0.7 - 4.0 K/uL   Monocytes Relative 6 %   Monocytes Absolute 0.5 0.1 - 1.0 K/uL   Eosinophils Relative 0 %   Eosinophils Absolute 0.0 0.0 - 0.5 K/uL   Basophils Relative 1 %   Basophils Absolute 0.1 0.0 - 0.1 K/uL   Immature Granulocytes 1 %   Abs Immature Granulocytes 0.06 0.00 - 0.07 K/uL    Comment: Performed at [facility], [address] [place], [place] [address]  Acetaminophen level     Status: Abnormal   Collection Time: 10/07/19  4:18 AM  Result Value Ref Range   Acetaminophen (Tylenol), Serum <10 (L) 10 - 30 ug/mL    Comment: (NOTE) Therapeutic concentrations vary significantly. A range of 10-30 ug/mL  may be an effective concentration for many patients. However, some  are best treated at concentrations outside of this range. Acetaminophen concentrations >150 ug/mL at 4 hours after ingestion  and >50 ug/mL at 12 hours after ingestion are often associated with  toxic reactions. Performed at [facility], [address] [place], [place] [address]   Salicylate level     Status: Abnormal   Collection Time: 10/07/19  4:18 AM  Result Value Ref Range   Salicylate Lvl <7.0 (L) 7.0 - 30.0 mg/dL    Comment: Performed at [facility], [address] [place], [place]  [address]  Protime-INR     Status: None   Collection Time: 10/07/19  4:18 AM  Result Value Ref Range   Prothrombin Time 13.4 11.4 - 15.2 seconds   INR 1.0 0.8 - 1.2    Comment: (NOTE) INR goal varies based on device and disease states. Performed at [facility], [address] [place], [place] [address]   Ammonia     Status: None   Collection Time: 10/07/19  4:18 AM  Result Value Ref Range   Ammonia 13 9 - 35 umol/L    Comment: Performed at [facility], [address] [place], [place] [address]  Magnesium     Status: Abnormal   Collection Time: 10/07/19  4:18 AM  Result Value Ref Range   Magnesium 1.5 (L) 1.7 - 2.4 mg/dL    Comment: Performed at [facility], [address] [place], [place] [address]  Glucose, capillary     Status: None   Collection Time: 10/07/19 11:08 AM  Result Value Ref Range   Glucose-Capillary 74 70 - 99 mg/dL   Comment 1 Notify RN    Comment 2 Document in Chart   Glucose, capillary     Status: Abnormal   Collection Time: 10/07/19  4:30 PM  Result Value Ref Range   Glucose-Capillary 66 (L) 70 - 99 mg/dL   Comment 1 Notify RN    Comment 2 Document in Chart   Glucose, capillary     Status: Abnormal   Collection Time: 10/07/19  5:37 PM  Result Value Ref Range   Glucose-Capillary 69 (L) 70 - 99 mg/dL  Glucose, capillary     Status: None   Collection Time:  10/07/19  7:52 PM  Result Value Ref Range   Glucose-Capillary 94 70 - 99 mg/dL   Comment 1 Notify RN    Comment 2 Document in Chart   Glucose, capillary     Status: None   Collection Time: 10/07/19 11:50 PM  Result Value Ref Range   Glucose-Capillary 91 70 - 99 mg/dL   Comment 1 Notify RN   Glucose, capillary     Status: None   Collection Time: 10/08/19  5:24 AM  Result Value Ref Range   Glucose-Capillary 72 70 - 99 mg/dL   Comment 1 Notify RN   Glucose, capillary     Status: None   Collection Time: 10/08/19  8:08 AM  Result Value Ref Range   Glucose-Capillary 84 70 - 99  mg/dL  Basic metabolic panel     Status: None   Collection Time: 10/08/19  8:27 AM  Result Value Ref Range   Sodium 140 135 - 145 mmol/L   Potassium 3.6 3.5 - 5.1 mmol/L   Chloride 106 98 - 111 mmol/L   CO2 23 22 - 32 mmol/L   Glucose, Bld 85 70 - 99 mg/dL   BUN 8 6 - 20 mg/dL   Creatinine, Ser 9.02 0.61 - 1.24 mg/dL   Calcium 9.0 8.9 - 40.9 mg/dL   GFR calc non Af Amer >60 >60 mL/min   GFR calc Af Amer >60 >60 mL/min   Anion gap 11 5 - 15    Comment: Performed at [facility], [address] [place], [place] [address]  Magnesium     Status: None   Collection Time: 10/08/19  8:27 AM  Result Value Ref Range   Magnesium 1.9 1.7 - 2.4 mg/dL    Comment: Performed at [facility], [address] [place], [place] [address]  Glucose, capillary     Status: Abnormal   Collection Time: 10/08/19 12:12 PM  Result Value Ref Range   Glucose-Capillary 118 (H) 70 - 99 mg/dL    Medications:  Current Facility-Administered Medications  Medication Dose Route Frequency Provider Last Rate Last Admin  . folic acid (FOLVITE) tablet 1 mg  1 mg Oral Daily Eduard Clos, MD   1 mg at 10/07/19 1327  . LORazepam (ATIVAN) injection 0-4 mg  0-4 mg Intravenous Q6H Eduard Clos, MD   3 mg at 10/07/19 2340   Followed by  . [START ON 10/09/2019] LORazepam (ATIVAN) injection 0-4 mg  0-4 mg Intravenous Q12H Eduard Clos, MD      . LORazepam (ATIVAN) tablet 1-4 mg  1-4 mg Oral Q1H PRN Eduard Clos, MD       Or  . LORazepam (ATIVAN) injection 1-4 mg  1-4 mg Intravenous Q1H PRN Eduard Clos, MD   3 mg at 10/08/19 0107  . multivitamin with minerals tablet 1 tablet  1 tablet Oral Daily Eduard Clos, MD   1 tablet at 10/07/19 1327  . nicotine (NICODERM CQ - dosed in mg/24 hours) patch 14 mg  14 mg Transdermal Daily Noralee Stain, DO      . thiamine tablet 100 mg  100 mg Oral Daily Eduard Clos, MD       Or  . thiamine (B-1) injection 100 mg  100 mg  Intravenous Daily Eduard Clos, MD   100 mg at 10/07/19 1006    Musculoskeletal: Strength & Muscle Tone: Unable to tell via telepsych Gait & Station: Did not see patient  ambulate Patient leans: N/A  Psychiatric Specialty Exam: Physical Exam  Review of Systems  Blood pressure (!) 148/89, pulse 98, temperature 99.2 F (37.3 C), temperature source Oral, resp. rate 19, height  (1.727 m), weight 85.9 kg, SpO2 99 %.Body mass index is 28.79 kg/m.  General Appearance: Camera not working on [employer] unable to visibly see patient  Eye Contact:  Camera not working on [employer] unable to visibly see patient  Speech:  Clear and Coherent and Normal Rate  Volume:  Normal  Mood:  "Fine"  Affect:  Camera not working on telepsych machine unable to visibly see patient  Thought Process:  Coherent, Goal Directed and Descriptions of Associations: Intact  Orientation:  Full (Time, Place, and Person)  Thought Content:  WDL  Suicidal Thoughts:  Patient denies at this time  Homicidal Thoughts:  No  Memory:  Immediate;   Fair Recent;   Fair  Judgement:  Fair  Insight:  Present  Psychomotor Activity:  Camera not working on telepsych machine unable to visibly see patient  Concentration:  Concentration: Good and Attention Span: Good  Recall:  Fair  Fund of Knowledge:  Good  Language:  Good  Akathisia:  Camera not working on telepsych machine unable to visibly see patient  Handed:  Right  AIMS (if indicated):     Assets:  Communication Skills Desire for Improvement Housing Social Support  ADL's:  Intact  Cognition:  WNL  Sleep:        Treatment Plan Summary: Plan Inpatient psychiatric treatment when medically cleared  Disposition: Recommend psychiatric Inpatient admission when medically cleared.  This service was provided via telemedicine using a 2-way, interactive audio and video technology.  Names of all persons participating in this telemedicine service and  their role in this encounter. Name: Assunta Found Role: Nurse practitioner  Name: Dr. Lucianne Muss Role: Psychiatrist  Name: Brett Sanford Role: Patient  Name:  Role:     Assunta Found, NP 10/08/2019 1:28 PM

## 2019-10-08 NOTE — Progress Notes (Signed)
1945: 3mg  Ativan IV for agitation.  2110: 2mg  IV Ativan for agitation. Patient getting more agitated.  2200: Patient increasingly agitated, hallucinating, delusional and aggressive at some points. Bilateral wrist restraints applied.  2300: Posy belt applied. Ativan has not helped agitation  2340: 3mg  IV Ativan. Agitation not relieved  0107: 3mg  IV Ativan.   1829-9371: Patient is asleep!  0515: Patient awake, A&Ox3. Alert to himself and year. Pleasant and alert. Off on the month and location initially but working on reorienting. Patient informed RN he took 3 handfuls of a sleep aid but was unaware it contained Benadryl. Patient stated he took some other pills but cannot remember which pill. Patient has no recollection of time here at the hospital but remembers EMS picking him up. Patient did state at the time he did want to commit suicide but does not currently feel that way. I explained to him he will still be under suicide precautions and will be watching for alcohol withdrawal.    RN going to trial bilateral wrist restraints off but will keep posey belt on. EKG completed and in chart. Spoke with Poison Control who suggested repeating potassium and magnesium labs due to prolonged QTC. Patient unable to urinate. Bladder scan showed 439mL, I&O was 420mL. CBG 72, patient was thirsty and RN supervised a cup of OJ. Patient swallowed without difficulty.   6967: Patient still having some hallucinations (dog in room) and mind wandering a lot. Needs redirection but pleasant and staying in bed. Some parts of conversation normal, other times says some off the wall things but overall patient going in the right direction.

## 2019-10-08 NOTE — Progress Notes (Signed)
Patient requests Mother to be designated visitor & emergency contact but does not wish anymore medical or psych information to be passed on now that he is A&O.

## 2019-10-08 NOTE — Progress Notes (Signed)
Patient reports urinating multiple times today.  Bladder scan completed showing 28ml in bladder 1 urine occurrence reported.

## 2019-10-09 NOTE — Progress Notes (Addendum)
Update: Police have signed and served, copies of IVC placed in hard chart.   CSW has initiated IVC process, completed paperwork, MD signed, faxed to [employer]. Pending return fax for patient to be served.   [place], [place]

## 2019-10-09 NOTE — Progress Notes (Signed)
PROGRESS NOTE    Brett Sanford  ZOX:[id number] DOB: 1990-10-19 DOA: 10/06/2019 PCP: Everrett CoombeMatthews, Cody, DO     Brief Narrative:  Brett Sanford is a 28 y.o. male w/ depression and ADD, has not been taking his meds, likely drinks alcohol every day, was found to be unresponsive in his house on the porch by his mother. Apparently, a few minutes prior to that, he had texted his sister "goodbye" and mother reported her Benadryl bottle which was full has been missing in her medication cupboard in the bathroom and she did see him going to the bathroom.  Poison control was contacted for concern for intentional Benadryl overdose.  Patient's mentation continued to improve.  Psychiatry evaluated patient, recommended inpatient psych placement.  New events last 24 hours / Subjective: Patient seen with sitter at bedside.  Alert and awake and appropriate on examination.  Discussed psychiatry recommendation for inpatient psych hospitalization which patient was very unhappy about.  He continued to voice his desire to continue outpatient psych follow-up instead of inpatient psych hospitalization.  Assessment & Plan:   Principal Problem:   Drug overdose Active Problems:   Attention deficit hyperactivity disorder (ADHD)   History of alcohol abuse    Intentional drug overdose, suicidal attempt -Sitter at bedside -Appreciate poison control -Psych consult, recommending inpatient psych hospitalization -Prozac  Alcohol abuse -CIWA -Continues to require Ativan, required total 12 mg yesterday -Folic acid, thiamine  Tobacco abuse -Nicotine patch   DVT prophylaxis: SCD Code Status: Full code Family Communication: No family at bedside Disposition Plan: Continue CIWA protocol and once patient is requiring less Ativan, patient will need inpatient psych hospitalization.  Patient may have to be IVC'd if he continues to refuse inpatient psych hospitalization.   Consultants:   Psych  Procedures:    None  Antimicrobials:  Anti-infectives (From admission, onward)   None       Objective: Vitals:   10/08/19 2003 10/08/19 2308 10/09/19 0354 10/09/19 0850  BP: (!) 144/82 (!) 131/104 130/86 129/78  Pulse:      Resp: 18 20 18    Temp: 98.1 F (36.7 C) 98.6 F (37 C) 98.6 F (37 C) 98.3 F (36.8 C)  TempSrc: Oral Oral Oral Oral  SpO2: 100% 98%  97%  Weight:      Height:        Intake/Output Summary (Last 24 hours) at 10/09/2019 0954 Last data filed at 10/08/2019 1338 Gross per 24 hour  Intake --  Output 200 ml  Net -200 ml   Filed Weights   10/07/19 0218  Weight: 85.9 kg    Examination: General exam: Appears calm but upset Respiratory system: Clear to auscultation. Respiratory effort normal. Cardiovascular system: S1 & S2 heard, RRR. No pedal edema. Gastrointestinal system: Abdomen is nondistended, soft and nontender. Normal bowel sounds heard. Central nervous system: Alert and oriented. Non focal exam. Speech clear, bilateral tremors of the hands Extremities: Symmetric in appearance bilaterally  Skin: No rashes, lesions or ulcers on exposed skin  Psychiatry: Stable   Data Reviewed: I have personally reviewed following labs and imaging studies  CBC: Recent Labs  Lab 10/02/19 1830 10/06/19 2057 10/07/19 0418  WBC 8.3 6.2 9.0  NEUTROABS  --  2.8 6.9  HGB 15.6 14.5 13.2  HCT 45.7 42.6 38.2*  MCV 91.2 92.6 91.2  PLT 350 245 236   Basic Metabolic Panel: Recent Labs  Lab 10/02/19 1830 10/06/19 2057 10/07/19 0020 10/07/19 0418 10/08/19 0827  NA 140 142  --  142 140  K 4.3 3.7  --  3.8 3.6  CL 103 105  --  107 106  CO2 22 25  --  22 23  GLUCOSE 98 66*  --  86 85  BUN 17 13  --  9 8  CREATININE 1.09 0.95  --  0.86 0.81  CALCIUM 9.1 8.8*  --  8.4* 9.0  MG  --   --  1.6* 1.5* 1.9   GFR: Estimated Creatinine Clearance: 146.1 mL/min (by C-G formula based on SCr of 0.81 mg/dL). Liver Function Tests: Recent Labs  Lab 10/06/19 2057 10/07/19 0418   AST 21 23  ALT 17 16  ALKPHOS 64 54  BILITOT 0.8 1.1  PROT 6.6 6.3*  ALBUMIN 3.9 3.8   No results for input(s): LIPASE, AMYLASE in the last 168 hours. Recent Labs  Lab 10/07/19 0418  AMMONIA 13   Coagulation Profile: Recent Labs  Lab 10/07/19 0418  INR 1.0   Cardiac Enzymes: No results for input(s): CKTOTAL, CKMB, CKMBINDEX, TROPONINI in the last 168 hours. BNP (last 3 results) No results for input(s): PROBNP in the last 8760 hours. HbA1C: No results for input(s): HGBA1C in the last 72 hours. CBG: Recent Labs  Lab 10/08/19 0808 10/08/19 1212 10/08/19 1605 10/08/19 1950 10/08/19 2304  GLUCAP 84 118* 127* 135* 96   Lipid Profile: No results for input(s): CHOL, HDL, LDLCALC, TRIG, CHOLHDL, LDLDIRECT in the last 72 hours. Thyroid Function Tests: No results for input(s): TSH, T4TOTAL, FREET4, T3FREE, THYROIDAB in the last 72 hours. Anemia Panel: No results for input(s): VITAMINB12, FOLATE, FERRITIN, TIBC, IRON, RETICCTPCT in the last 72 hours. Sepsis Labs: No results for input(s): PROCALCITON, LATICACIDVEN in the last 168 hours.  Recent Results (from the past 240 hour(s))  Novel Coronavirus, NAA ([facility])     Status: None   Collection Time: 10/02/19 12:00 AM   Specimen: Nasopharyngeal(NP) swabs in vial transport medium   NASOPHARYNGE  TESTING  Result Value Ref Range Status   SARS-CoV-2, NAA Not Detected Not Detected Final    Comment: This nucleic acid amplification test was developed and its performance characteristics determined by [employer]. Nucleic acid amplification tests include PCR and TMA. This test has not been FDA cleared or approved. This test has been authorized by FDA under an Emergency Use Authorization (EUA). This test is only authorized for the duration of time the declaration that circumstances exist justifying the authorization of the emergency use of in vitro diagnostic tests for detection of SARS-CoV-2 virus and/or diagnosis of  COVID-19 infection under section 564(b)(1) of the Act, 21 U.S.C. 226JFH-5(K) (1), unless the authorization is terminated or revoked sooner. When diagnostic testing is negative, the possibility of a false negative result should be considered in the context of a patient's recent exposures and the presence of clinical signs and symptoms consistent with COVID-19. An individual without symptoms of COVID-19 and who is not shedding SARS-CoV-2 virus would  expect to have a negative (not detected) result in this assay.   SARS CORONAVIRUS 2 (TAT 6-24 HRS) Nasopharyngeal Nasopharyngeal Swab     Status: None   Collection Time: 10/06/19  9:16 PM   Specimen: Nasopharyngeal Swab  Result Value Ref Range Status   SARS Coronavirus 2 NEGATIVE NEGATIVE Final    Comment: (NOTE) SARS-CoV-2 target nucleic acids are NOT DETECTED. The SARS-CoV-2 RNA is generally detectable in upper and lower respiratory specimens during the acute phase of infection. Negative results do not preclude SARS-CoV-2 infection, do not rule out co-infections  with other pathogens, and should not be used as the sole basis for treatment or other patient management decisions. Negative results must be combined with clinical observations, patient history, and epidemiological information. The expected result is Negative. Fact Sheet for Patients: SugarRoll.be Fact Sheet for Healthcare Providers: https://www.woods-mathews.com/ This test is not yet approved or cleared by the [place] FDA and  has been authorized for detection and/or diagnosis of SARS-CoV-2 by FDA under an Emergency Use Authorization (EUA). This EUA will remain  in effect (meaning this test can be used) for the duration of the COVID-19 declaration under Section 56 4(b)(1) of the Act, 21 U.S.C. section 360bbb-3(b)(1), unless the authorization is terminated or revoked sooner. Performed at [facility], [place] [address]  [place], [place] [address]       Radiology Studies: No results found.    Scheduled Meds: . FLUoxetine  40 mg Oral Daily  . folic acid  1 mg Oral Daily  . LORazepam  0-4 mg Intravenous Q12H  . multivitamin with minerals  1 tablet Oral Daily  . nicotine  14 mg Transdermal Daily  . thiamine  100 mg Oral Daily   Continuous Infusions:   LOS: 2 days      Time spent: 35 minutes   Dessa Phi, DO [facility] 10/09/2019, 9:54 AM   Available via Epic secure chat 7am-7pm After these hours, please refer to coverage provider listed on [email or web address]

## 2019-10-10 ENCOUNTER — Encounter (HOSPITAL_COMMUNITY): Payer: Self-pay | Admitting: Registered Nurse

## 2019-10-10 ENCOUNTER — Other Ambulatory Visit: Payer: Self-pay

## 2019-10-10 ENCOUNTER — Inpatient Hospital Stay (HOSPITAL_COMMUNITY)
Admission: AD | Admit: 2019-10-10 | Discharge: 2019-10-17 | DRG: 881 | Disposition: A | Discharge status: Home / Self Care | Payer: BC Managed Care – PPO | Attending: Psychiatry | Admitting: Psychiatry

## 2019-10-10 DIAGNOSIS — F332 Major depressive disorder, recurrent severe without psychotic features: Secondary | ICD-10-CM | POA: Diagnosis not present

## 2019-10-10 DIAGNOSIS — F988 Other specified behavioral and emotional disorders with onset usually occurring in childhood and adolescence: Secondary | ICD-10-CM | POA: Diagnosis present

## 2019-10-10 DIAGNOSIS — F1111 Opioid abuse, in remission: Secondary | ICD-10-CM | POA: Diagnosis present

## 2019-10-10 DIAGNOSIS — Z818 Family history of other mental and behavioral disorders: Secondary | ICD-10-CM | POA: Diagnosis not present

## 2019-10-10 DIAGNOSIS — Z9114 Patient's other noncompliance with medication regimen: Secondary | ICD-10-CM | POA: Diagnosis not present

## 2019-10-10 DIAGNOSIS — F10239 Alcohol dependence with withdrawal, unspecified: Secondary | ICD-10-CM | POA: Diagnosis not present

## 2019-10-10 DIAGNOSIS — F17200 Nicotine dependence, unspecified, uncomplicated: Secondary | ICD-10-CM | POA: Diagnosis present

## 2019-10-10 DIAGNOSIS — T450X2A Poisoning by antiallergic and antiemetic drugs, intentional self-harm, initial encounter: Secondary | ICD-10-CM | POA: Diagnosis present

## 2019-10-10 DIAGNOSIS — F329 Major depressive disorder, single episode, unspecified: Principal | ICD-10-CM | POA: Diagnosis present

## 2019-10-10 DIAGNOSIS — G4700 Insomnia, unspecified: Secondary | ICD-10-CM | POA: Diagnosis present

## 2019-10-10 DIAGNOSIS — Z79899 Other long term (current) drug therapy: Secondary | ICD-10-CM | POA: Diagnosis not present

## 2019-10-10 DIAGNOSIS — F419 Anxiety disorder, unspecified: Secondary | ICD-10-CM | POA: Diagnosis present

## 2019-10-10 DIAGNOSIS — R410 Disorientation, unspecified: Secondary | ICD-10-CM | POA: Diagnosis present

## 2019-10-10 DIAGNOSIS — Y908 Blood alcohol level of 240 mg/100 ml or more: Secondary | ICD-10-CM | POA: Diagnosis present

## 2019-10-10 DIAGNOSIS — F322 Major depressive disorder, single episode, severe without psychotic features: Secondary | ICD-10-CM | POA: Diagnosis present

## 2019-10-10 DIAGNOSIS — Z888 Allergy status to other drugs, medicaments and biological substances status: Secondary | ICD-10-CM

## 2019-10-10 MED ORDER — NICOTINE 14 MG/24HR TD PT24
14.0000 mg | MEDICATED_PATCH | Freq: Every day | TRANSDERMAL | Status: DC
  Administered 2019-10-11 – 2019-10-17 (×7): 14 mg via TRANSDERMAL
  Filled 2019-10-10 (×10): qty 1

## 2019-10-10 MED ORDER — FLUOXETINE HCL 20 MG PO CAPS
40.0000 mg | ORAL_CAPSULE | Freq: Every day | ORAL | Status: DC
  Administered 2019-10-11 – 2019-10-17 (×7): 40 mg via ORAL
  Filled 2019-10-10 (×9): qty 2

## 2019-10-10 MED ORDER — THIAMINE HCL 100 MG PO TABS: 100.0000 mg | ORAL_TABLET | Freq: Every day | ORAL | 2 refills | Status: DC

## 2019-10-10 MED ORDER — FOLIC ACID 1 MG PO TABS: 1.0000 mg | ORAL_TABLET | Freq: Every day | ORAL | 2 refills | Status: DC

## 2019-10-10 MED ORDER — TRAZODONE HCL 50 MG PO TABS
50.0000 mg | ORAL_TABLET | Freq: Every evening | ORAL | Status: DC | PRN
  Administered 2019-10-10 – 2019-10-16 (×7): 50 mg via ORAL
  Filled 2019-10-10 (×8): qty 1

## 2019-10-10 MED ORDER — THIAMINE HCL 100 MG PO TABS
100.0000 mg | ORAL_TABLET | Freq: Every day | ORAL | Status: DC
  Administered 2019-10-11 – 2019-10-17 (×7): 100 mg via ORAL
  Filled 2019-10-10 (×9): qty 1

## 2019-10-10 MED ORDER — NICOTINE 14 MG/24HR TD PT24: 14.0000 mg | MEDICATED_PATCH | Freq: Every day | TRANSDERMAL | 0 refills | Status: DC

## 2019-10-10 MED ORDER — ACETAMINOPHEN 325 MG PO TABS
650.0000 mg | ORAL_TABLET | Freq: Four times a day (QID) | ORAL | Status: DC | PRN
  Administered 2019-10-10: 650 mg via ORAL
  Filled 2019-10-10: qty 2

## 2019-10-10 MED ORDER — ADULT MULTIVITAMIN W/MINERALS CH
1.0000 | ORAL_TABLET | Freq: Every day | ORAL | Status: DC
  Administered 2019-10-11 – 2019-10-17 (×7): 1 via ORAL
  Filled 2019-10-10 (×10): qty 1

## 2019-10-10 MED ORDER — HYDROXYZINE HCL 25 MG PO TABS
25.0000 mg | ORAL_TABLET | Freq: Three times a day (TID) | ORAL | Status: DC | PRN
  Administered 2019-10-10 – 2019-10-17 (×13): 25 mg via ORAL
  Filled 2019-10-10 (×13): qty 1

## 2019-10-10 MED ORDER — FOLIC ACID 1 MG PO TABS
1.0000 mg | ORAL_TABLET | Freq: Every day | ORAL | Status: DC
  Administered 2019-10-11 – 2019-10-17 (×7): 1 mg via ORAL
  Filled 2019-10-10 (×9): qty 1

## 2019-10-10 NOTE — Tx Team (Signed)
Initial Treatment Plan 10/10/2019 5:01 PM Brett Sanford [id number]    PATIENT STRESSORS: Health problems Occupational concerns Substance abuse   PATIENT STRENGTHS: Ability for insight Capable of independent living Communication skills Supportive family/friends   PATIENT IDENTIFIED PROBLEMS: "Coping skills"  "Build support system  Suicidal Ideation  Substance Abuse  Depression             DISCHARGE CRITERIA:  Ability to meet basic life and health needs Adequate post-discharge living arrangements Motivation to continue treatment in a less acute level of care  PRELIMINARY DISCHARGE PLAN: Attend aftercare/continuing care group Outpatient therapy Return to previous living arrangement  PATIENT/FAMILY INVOLVEMENT: This treatment plan has been presented to and reviewed with the patient, Brett Sanford, and/or family member.  The patient and family have been given the opportunity to ask questions and make suggestions.  [name], RN 10/10/2019, 5:01 PM

## 2019-10-10 NOTE — Progress Notes (Signed)
[facility] Notes:  (Nursing/MHT/Case Management/Adjunct)  Date:  10/10/2019  Time:  2030  Type of Therapy:  wrap up group  Participation Level:  Active  Participation Quality:  Appropriate, Attentive, Sharing and Supportive  Affect:  Depressed  Cognitive:  Appropriate  Insight:  Improving  Engagement in Group:  Engaged  Modes of Intervention:  Clarification, Education and Support  Summary of Progress/Problems: Pt shared that she finally got some much needed sleep. Pt plans on changing his living arrangements after leaving the hospital. Pt is grateful for his life and to be alive.   Winfield Rast S 10/10/2019, 9:05 PM

## 2019-10-10 NOTE — Progress Notes (Signed)
CSW spoke with [employer], to pick up patient to transport to [facility] bed 305-2.  Nurse to call report to [phone].   CSW informed [facility] of patient's covid test resulting 12/28 as negative, they are able to accept this test.   RN informed of transport to pick up.     [place], [place]

## 2019-10-10 NOTE — Progress Notes (Signed)
   Patient medically stable for discharge to inpatient psych facility today.   Dessa Phi, DO [facility] 10/10/2019, 10:25 AM  Available via Epic secure chat 7am-7pm After these hours, please refer to coverage provider listed on [email or web address]

## 2019-10-10 NOTE — Discharge Summary (Signed)
Physician Discharge Summary  Brett Sanford [id number]:[id number] DOB: 11-10-1990 DOA: 10/06/2019  PCP: Everrett Coombe, DO  Admit date: 10/06/2019 Discharge date: 10/10/2019  Admitted From: Home Disposition:  Inpatient psych   Discharge Condition: Stable CODE STATUS: Full Diet recommendation: Regular   Brief/Interim Summary: Brett Sanford is a 28 y.o. male w/depression and ADD, has notbeen taking his meds, likely drinks alcohol every day, was found to be unresponsive in his house on the porch by his mother. Apparently, a few minutes prior to that,he had texted his sister "goodbye"and mother reportedher Benadryl bottle which was full has been missing in her medication cupboard in the bathroom and she did see him going to the bathroom.  Poison control was contacted for concern for intentional Benadryl overdose.  Patient's mentation continued to improve.  Psychiatry evaluated patient, recommended inpatient psych placement.  Discharge Diagnoses:  Principal Problem:   Drug overdose, intentional self-harm, initial encounter [facility]) Active Problems:   Attention deficit hyperactivity disorder (ADHD)   History of alcohol abuse   Intentional drug overdose, suicidal attempt -Sitter at bedside -Appreciate poison control -Psych consult, recommending inpatient psych hospitalization -Prozac  Alcohol abuse -CIWA. Improved from withdrawal standpoint -Folic acid, thiamine  Tobacco abuse -Nicotine patch   Discharge Instructions  Discharge Instructions    Diet general   Complete by: As directed    Increase activity slowly   Complete by: As directed      Allergies as of 10/10/2019      Reactions   Benadryl [diphenhydramine Hcl] Other (See Comments)   Caused hyperactivity      Medication List    STOP taking these medications   dicyclomine 20 MG tablet Commonly known as: BENTYL   esomeprazole 20 MG capsule Commonly known as: NexIUM   ondansetron 4 MG tablet Commonly known as:  Zofran     TAKE these medications   FLUoxetine 40 MG capsule Commonly known as: PROZAC Take 40 mg by mouth daily.   folic acid 1 MG tablet Commonly known as: FOLVITE Take 1 tablet (1 mg total) by mouth daily. Start taking on: October 11, 2019   nicotine 14 mg/24hr patch Commonly known as: NICODERM CQ - dosed in mg/24 hours Place 1 patch (14 mg total) onto the skin daily. Start taking on: October 11, 2019   thiamine 100 MG tablet Take 1 tablet (100 mg total) by mouth daily. Start taking on: October 11, 2019       Allergies  Allergen Reactions  . Benadryl [Diphenhydramine Hcl] Other (See Comments)    Caused hyperactivity    Consultations: Psych  Procedures/Studies: DG Chest Portable 1 View  Result Date: 10/02/2019 CLINICAL DATA:  Chest pain EXAM: PORTABLE CHEST 1 VIEW COMPARISON:  None. FINDINGS: The heart size and mediastinal contours are within normal limits. Both lungs are clear. The visualized skeletal structures are unremarkable. IMPRESSION: No active disease. Electronically Signed   By: Jonna Clark M.D.   On: 10/02/2019 19:45      Discharge Exam: Vitals:   10/09/19 2000 10/10/19 0542  BP: (!) 112/96 (!) 140/91  Pulse: (!) 108 88  Resp: 18 20  Temp: 98.2 F (36.8 C) 98 F (36.7 C)  SpO2: 99% 100%    General: Pt is alert, awake, not in acute distress Cardiovascular: RRR, S1/S2 +, no edema Respiratory: CTA bilaterally, no wheezing, no rhonchi, no respiratory distress, no conversational dyspnea  Abdominal: Soft, NT, ND, bowel sounds + Extremities: no edema, no cyanosis Psych: Stable     The  results of significant diagnostics from this hospitalization (including imaging, microbiology, ancillary and laboratory) are listed below for reference.     Microbiology: Recent Results (from the past 240 hour(s))  Novel Coronavirus, NAA ([facility])     Status: None   Collection Time: 10/02/19 12:00 AM   Specimen: Nasopharyngeal(NP) swabs in vial transport medium    NASOPHARYNGE  TESTING  Result Value Ref Range Status   SARS-CoV-2, NAA Not Detected Not Detected Final    Comment: This nucleic acid amplification test was developed and its performance characteristics determined by [employer]. Nucleic acid amplification tests include PCR and TMA. This test has not been FDA cleared or approved. This test has been authorized by FDA under an Emergency Use Authorization (EUA). This test is only authorized for the duration of time the declaration that circumstances exist justifying the authorization of the emergency use of in vitro diagnostic tests for detection of SARS-CoV-2 virus and/or diagnosis of COVID-19 infection under section 564(b)(1) of the Act, 21 U.S.C. 756EPP-2(R) (1), unless the authorization is terminated or revoked sooner. When diagnostic testing is negative, the possibility of a false negative result should be considered in the context of a patient's recent exposures and the presence of clinical signs and symptoms consistent with COVID-19. An individual without symptoms of COVID-19 and who is not shedding SARS-CoV-2 virus would  expect to have a negative (not detected) result in this assay.   SARS CORONAVIRUS 2 (TAT 6-24 HRS) Nasopharyngeal Nasopharyngeal Swab     Status: None   Collection Time: 10/06/19  9:16 PM   Specimen: Nasopharyngeal Swab  Result Value Ref Range Status   SARS Coronavirus 2 NEGATIVE NEGATIVE Final    Comment: (NOTE) SARS-CoV-2 target nucleic acids are NOT DETECTED. The SARS-CoV-2 RNA is generally detectable in upper and lower respiratory specimens during the acute phase of infection. Negative results do not preclude SARS-CoV-2 infection, do not rule out co-infections with other pathogens, and should not be used as the sole basis for treatment or other patient management decisions. Negative results must be combined with clinical observations, patient history, and epidemiological information. The  expected result is Negative. Fact Sheet for Patients: SugarRoll.be Fact Sheet for Healthcare Providers: https://www.woods-mathews.com/ This test is not yet approved or cleared by the [place] FDA and  has been authorized for detection and/or diagnosis of SARS-CoV-2 by FDA under an Emergency Use Authorization (EUA). This EUA will remain  in effect (meaning this test can be used) for the duration of the COVID-19 declaration under Section 56 4(b)(1) of the Act, 21 U.S.C. section 360bbb-3(b)(1), unless the authorization is terminated or revoked sooner. Performed at [facility], [place] [address], [place], [place] [address]      Labs: BNP (last 3 results) No results for input(s): BNP in the last 8760 hours. Basic Metabolic Panel: Recent Labs  Lab 10/06/19 2057 10/07/19 0020 10/07/19 0418 10/08/19 0827  NA 142  --  142 140  K 3.7  --  3.8 3.6  CL 105  --  107 106  CO2 25  --  22 23  GLUCOSE 66*  --  86 85  BUN 13  --  9 8  CREATININE 0.95  --  0.86 0.81  CALCIUM 8.8*  --  8.4* 9.0  MG  --  1.6* 1.5* 1.9   Liver Function Tests: Recent Labs  Lab 10/06/19 2057 10/07/19 0418  AST 21 23  ALT 17 16  ALKPHOS 64 54  BILITOT 0.8 1.1  PROT 6.6  6.3*  ALBUMIN 3.9 3.8   No results for input(s): LIPASE, AMYLASE in the last 168 hours. Recent Labs  Lab 10/07/19 0418  AMMONIA 13   CBC: Recent Labs  Lab 10/06/19 2057 10/07/19 0418  WBC 6.2 9.0  NEUTROABS 2.8 6.9  HGB 14.5 13.2  HCT 42.6 38.2*  MCV 92.6 91.2  PLT 245 236   Cardiac Enzymes: No results for input(s): CKTOTAL, CKMB, CKMBINDEX, TROPONINI in the last 168 hours. BNP: Invalid input(s): POCBNP CBG: Recent Labs  Lab 10/08/19 0808 10/08/19 1212 10/08/19 1605 10/08/19 1950 10/08/19 2304  GLUCAP 84 118* 127* 135* 96   D-Dimer No results for input(s): DDIMER in the last 72 hours. Hgb A1c No results for input(s): HGBA1C in the last 72 hours. Lipid  Profile No results for input(s): CHOL, HDL, LDLCALC, TRIG, CHOLHDL, LDLDIRECT in the last 72 hours. Thyroid function studies No results for input(s): TSH, T4TOTAL, T3FREE, THYROIDAB in the last 72 hours.  Invalid input(s): FREET3 Anemia work up No results for input(s): VITAMINB12, FOLATE, FERRITIN, TIBC, IRON, RETICCTPCT in the last 72 hours. Urinalysis    Component Value Date/Time   COLORURINE AMBER (A) 08/22/2019 0016   APPEARANCEUR HAZY (A) 08/22/2019 0016   LABSPEC 1.033 (H) 08/22/2019 0016   PHURINE 6.0 08/22/2019 0016   GLUCOSEU NEGATIVE 08/22/2019 0016   HGBUR NEGATIVE 08/22/2019 0016   BILIRUBINUR NEGATIVE 08/22/2019 0016   KETONESUR 20 (A) 08/22/2019 0016   PROTEINUR 30 (A) 08/22/2019 0016   NITRITE NEGATIVE 08/22/2019 0016   LEUKOCYTESUR NEGATIVE 08/22/2019 0016   Sepsis Labs Invalid input(s): PROCALCITONIN,  WBC,  LACTICIDVEN Microbiology Recent Results (from the past 240 hour(s))  Novel Coronavirus, NAA ([facility])     Status: None   Collection Time: 10/02/19 12:00 AM   Specimen: Nasopharyngeal(NP) swabs in vial transport medium   NASOPHARYNGE  TESTING  Result Value Ref Range Status   SARS-CoV-2, NAA Not Detected Not Detected Final    Comment: This nucleic acid amplification test was developed and its performance characteristics determined by [employer]. Nucleic acid amplification tests include PCR and TMA. This test has not been FDA cleared or approved. This test has been authorized by FDA under an Emergency Use Authorization (EUA). This test is only authorized for the duration of time the declaration that circumstances exist justifying the authorization of the emergency use of in vitro diagnostic tests for detection of SARS-CoV-2 virus and/or diagnosis of COVID-19 infection under section 564(b)(1) of the Act, 21 U.S.C. 161WRU-0(A) (1), unless the authorization is terminated or revoked sooner. When diagnostic testing is negative, the possibility of a  false negative result should be considered in the context of a patient's recent exposures and the presence of clinical signs and symptoms consistent with COVID-19. An individual without symptoms of COVID-19 and who is not shedding SARS-CoV-2 virus would  expect to have a negative (not detected) result in this assay.   SARS CORONAVIRUS 2 (TAT 6-24 HRS) Nasopharyngeal Nasopharyngeal Swab     Status: None   Collection Time: 10/06/19  9:16 PM   Specimen: Nasopharyngeal Swab  Result Value Ref Range Status   SARS Coronavirus 2 NEGATIVE NEGATIVE Final    Comment: (NOTE) SARS-CoV-2 target nucleic acids are NOT DETECTED. The SARS-CoV-2 RNA is generally detectable in upper and lower respiratory specimens during the acute phase of infection. Negative results do not preclude SARS-CoV-2 infection, do not rule out co-infections with other pathogens, and should not be used as the sole basis for treatment or other patient management decisions.  Negative results must be combined with clinical observations, patient history, and epidemiological information. The expected result is Negative. Fact Sheet for Patients: HairSlick.nohttps://www.fda.gov/media/138098/download Fact Sheet for Healthcare Providers: quierodirigir.comhttps://www.fda.gov/media/138095/download This test is not yet approved or cleared by the [place] FDA and  has been authorized for detection and/or diagnosis of SARS-CoV-2 by FDA under an Emergency Use Authorization (EUA). This EUA will remain  in effect (meaning this test can be used) for the duration of the COVID-19 declaration under Section 56 4(b)(1) of the Act, 21 U.S.C. section 360bbb-3(b)(1), unless the authorization is terminated or revoked sooner. Performed at [facility], [address] [place], [place] [address]      Patient was seen and examined on the day of discharge and was found to be in stable condition. Time coordinating discharge: 35 minutes including assessment and  coordination of care, as well as examination of the patient.   SIGNED:  Noralee StainJennifer Phyliss Hulick, DO [facility] 10/10/2019, 2:02 PM

## 2019-10-10 NOTE — Progress Notes (Signed)
CSW spoke with [facility], reports facility is reviewing patient chart for acceptance.   CSW informed [place] that per MD patient is medically ready to dc today.   [place], [place]

## 2019-10-10 NOTE — Progress Notes (Signed)
Called report to Legrand Como at [facility]. Pt with IVC paperwork transported off unit by [employer]. Mother had brought clothes for patient to wear. Sweat pants with string cut out and tee shirt. Patient initially wanted to wear but decided to wear paper scrubs. Gave plastic bag with clothes to NT who gave to [employer]. Mom in room prior to transport and aware son has left hospital. Patient complained of mild headache prior to discharge and given 650 mg tylenol.

## 2019-10-10 NOTE — Progress Notes (Addendum)
Pt accepted to [facility]; bed 305-2    Shuvon Rankin, NP is the accepting provider.    Dr. Mallie Darting is the attending provider.    Call report to [place] @ [facility] notified.     Pt is involuntary and will be transported by law enforcement  Pt is scheduled to arrive at [facility] at 3pm, pending his Covid test results return as negative.  Audree Camel, LCSW, [place] Disposition [facility]/TTS [phone]

## 2019-10-10 NOTE — Progress Notes (Signed)
Admission Note: Patient is a 28 year old male admitted to the unit for intentional overdose as a suicide attempt and substance abuse.  Patient currently denies suicidal ideation and verbally contracts for safety while in the hospital.  Reports losing his job and pandemic as stressors.  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  Admission plan of care reviewed and consent signed.  Skin assessment and personal belongings completed.  Skin is dry and intact.  No contraband found.  Patient oriented to the unit, staff and room.  Verbalizes understanding of unit rules and protocols.  Patient is safe on the unit.

## 2019-10-11 DIAGNOSIS — F332 Major depressive disorder, recurrent severe without psychotic features: Secondary | ICD-10-CM

## 2019-10-11 LAB — HEMOGLOBIN A1C
Hgb A1c MFr Bld: 4.4 % — ABNORMAL LOW (ref 4.8–5.6)
Mean Plasma Glucose: 79.58 mg/dL

## 2019-10-11 LAB — LIPID PANEL
Cholesterol: 249 mg/dL — ABNORMAL HIGH (ref 0–200)
HDL: 107 mg/dL (ref >40)
LDL Cholesterol: 115 mg/dL — ABNORMAL HIGH (ref 0–99)
Total CHOL/HDL Ratio: 2.3 RATIO
Triglycerides: 136 mg/dL (ref <150)
VLDL: 27 mg/dL (ref 0–40)

## 2019-10-11 LAB — TSH: TSH: 5.482 u[IU]/mL — ABNORMAL HIGH (ref 0.350–4.500)

## 2019-10-11 MED ORDER — TRAMADOL HCL 50 MG PO TABS
50.0000 mg | ORAL_TABLET | Freq: Once | ORAL | Status: DC
  Filled 2019-10-11: qty 1

## 2019-10-11 MED ORDER — IBUPROFEN 400 MG PO TABS
400.0000 mg | ORAL_TABLET | Freq: Four times a day (QID) | ORAL | Status: DC | PRN
  Administered 2019-10-11: 400 mg via ORAL
  Filled 2019-10-11: qty 1

## 2019-10-11 MED ORDER — LORAZEPAM 1 MG PO TABS: 1.0000 mg | ORAL_TABLET | Freq: Four times a day (QID) | ORAL | Status: DC | PRN

## 2019-10-11 MED ORDER — ACETAMINOPHEN 325 MG PO TABS: 650.0000 mg | ORAL_TABLET | Freq: Four times a day (QID) | ORAL | Status: DC | PRN

## 2019-10-11 NOTE — Progress Notes (Signed)
Pt attended AA group this evening.  

## 2019-10-11 NOTE — BHH Suicide Risk Assessment (Signed)
[facility] Admission Suicide Risk Assessment   Nursing information obtained from:  Patient Demographic factors:  Adolescent or young adult, Low socioeconomic status, Unemployed Current Mental Status:  Suicidal ideation indicated by patient Loss Factors:  Financial problems / change in socioeconomic status Historical Factors:  Prior suicide attempts Risk Reduction Factors:  Living with another person, especially a relative  Total Time spent with patient: 20 minutes Principal Problem: <principal problem not specified> Diagnosis:  Active Problems:   MDD (major depressive disorder)  Subjective Data: Patient is seen and examined.  Patient is a 29 year old male with a past psychiatric history significant of alcohol dependence who originally presented to the [facility] emergency department on 10/06/2019 secondary to an overdose of Benadryl.  Patient arrived in the emergency room with altered mental status.  He was found by his mother, and transported to the emergency department.  He was admitted to the medicine side of the hospital.  The patient admitted on history and physical to the [facility] medical unit that he had not been compliant with medications and drinks alcohol essentially on every day basis.  He was initially somewhat delirious on admission but this cleared during the course of the hospitalization.  He was noted to be confused and hallucinating during the initial evaluation.  He improved during the course hospitalization was seen by the psychiatric consultation service on 10/08/2019.  The patient stated that [facility] on a 3-day a week basis.  He had been clean for several months until relapsing on alcohol recently.  He had also last been psychiatrically hospitalized at old [place] approximately a month prior to admission.  He reported drinking somewhere between 1/2 to 1 pint of alcohol weekly.  The patient stated  in the psychiatric consultation that the overdose was not a suicide attempt.  He was discharged from the medical unit on 10/10/2019 and transferred to our facility for psychiatric treatment.  He was last admitted to our facility in June of this year.  He endorsed suicidal ideation at that time.  He stated he was planning on overdosing on pills.  He did admit to a significant history of alcohol abuse.  He stated at that time he was drinking 1/5 of alcohol daily.  He also had a history of blackouts as well as a seizure when using LSD.  He was not discharged with any medications, and was referred for outpatient treatment.  In September of this year and a primary care note it appears he was taken fluoxetine, clonazepam, naltrexone, Sonata.  And a primary care visit on 08/21/2019 again showed clonazepam 1 mg p.o. twice daily, fluoxetine 40 mg p.o. daily, Vyvanse, naltrexone, and the Sonata.  His discharge medications from the medical hospital where the fluoxetine 40 mg p.o. daily, folic acid, thiamine and a nicotine patch.  We did discuss today that his Vyvanse nor his clonazepam would be continued here at our facility.  He is in agreement with that.  He was admitted to the hospital for evaluation and stabilization.  Continued Clinical Symptoms:  Alcohol Use Disorder Identification Test Final Score (AUDIT): 0 The "Alcohol Use Disorders Identification Test", Guidelines for Use in Primary Care, Second Edition.  World Science writer [facility]). Score between 0-7:  no or low risk or alcohol related problems. Score between 8-15:  moderate risk of alcohol related problems. Score between 16-19:  high risk of alcohol related problems. Score 20 or above:  warrants further  diagnostic evaluation for alcohol dependence and treatment.   CLINICAL FACTORS:   Depression:   Anhedonia Comorbid alcohol abuse/dependence Hopelessness Impulsivity Insomnia Alcohol/Substance Abuse/Dependencies   Musculoskeletal: Strength &  Muscle Tone: within normal limits Gait & Station: normal Patient leans: N/A  Psychiatric Specialty Exam: Physical Exam  Nursing note and vitals reviewed. Constitutional: He is oriented to person, place, and time. He appears well-developed and well-nourished.  HENT:  Head: Normocephalic and atraumatic.  Respiratory: Effort normal.  Neurological: He is alert and oriented to person, place, and time.    Review of Systems  Blood pressure 128/73, pulse 89, temperature (!) 97.4 F (36.3 C), temperature source Oral, resp. rate 18, height 5\' 8"  (1.727 m), weight 81.6 kg, SpO2 100 %.Body mass index is 27.37 kg/m.  General Appearance: Disheveled  Eye Contact:  Fair  Speech:  Normal Rate  Volume:  Decreased  Mood:  Anxious, Depressed and Dysphoric  Affect:  Constricted  Thought Process:  Coherent and Descriptions of Associations: Circumstantial  Orientation:  Full (Time, Place, and Person)  Thought Content:  Logical  Suicidal Thoughts:  No  Homicidal Thoughts:  No  Memory:  Immediate;   Fair Recent;   Fair Remote;   Fair  Judgement:  Intact  Insight:  Lacking  Psychomotor Activity:  Normal  Concentration:  Concentration: Fair and Attention Span: Fair  Recall:  of Knowledge:  Fair  Language:  Good  Akathisia:  Negative  Handed:  Right  AIMS (if indicated):     Assets:  Desire for Improvement Resilience  ADL's:  Intact  Cognition:  WNL  Sleep:  Number of Hours: 5.25      COGNITIVE FEATURES THAT CONTRIBUTE TO RISK:  None    SUICIDE RISK:   Mild:  Suicidal ideation of limited frequency, intensity, duration, and specificity.  There are no identifiable plans, no associated intent, mild dysphoria and related symptoms, good self-control (both objective and subjective assessment), few other risk factors, and identifiable protective factors, including available and accessible social support.  PLAN OF CARE: Patient is seen and examined.  Patient is a 29 year old male with  the above-stated past psychiatric history was transferred to our facility for continued treatment of alcohol dependence, depression (major depression versus substance-induced mood disorder) as well as attention deficit hyperactivity disorder.  He will be admitted to the hospital.  He will be integrated in the milieu.  He will be encouraged to attend groups.  His fluoxetine will be continued at 40 mg p.o. daily.  Given his 4-day stay in the medical hospital I doubt that he will have any further withdrawal symptoms.  His vital signs are stable, and he is afebrile.  No benzodiazepines will be placed on a regular basis, but I will place 1 mg lorazepam p.o. every 6 hours as needed withdrawal symptoms given his previous treatment with the clonazepam.  We will also not restart his Vyvanse at this point, nor his Sonata.  He will have trazodone 50 mg p.o. nightly as needed insomnia if necessary.  Review of his laboratories showed normal liver function enzymes, normal electrolytes.  Lipids were stable.  His CBC was essentially normal.  His PT and INR were within normal limits.  On admission his Tylenol level and salicylate were negative.  His TSH was essentially normal at 5.482.  His blood alcohol on admission was 272.  Drug screen was negative.  3 on admission was negative.  His EKG from 12/29 appeared to have a junctional rhythm with a  normal QTc interval.  I certify that inpatient services furnished can reasonably be expected to improve the patient's condition.   Sharma Covert, MD 10/11/2019, 10:52 AM

## 2019-10-11 NOTE — H&P (Signed)
Psychiatric Admission Assessment Adult  Patient Identification: Brett GaribaldiSeth P Sanford MRN:  [id number] Date of Evaluation:  10/11/2019 Chief Complaint:  MDD (major depressive disorder) [F32.9] Principal Diagnosis: <principal problem not specified> Diagnosis:  Active Problems:   MDD (major depressive disorder)  History of Present Illness: Patient is seen and examined.  Patient is a 29 year old male with a past psychiatric history significant of alcohol dependence who originally presented to the [facility] emergency department on 10/06/2019 secondary to an overdose of Benadryl.  Patient arrived in the emergency room with altered mental status.  He was found by his mother, and transported to the emergency department.  He was admitted to the medicine side of the hospital.  The patient admitted on history and physical to the [facility] medical unit that he had not been compliant with medications and drinks alcohol essentially on every day basis.  He was initially somewhat delirious on admission but this cleared during the course of the hospitalization.  He was noted to be confused and hallucinating during the initial evaluation.  He improved during the course hospitalization was seen by the psychiatric consultation service on 10/08/2019.  The patient stated that [facility] on a 3-day a week basis.  He had been clean for several months until relapsing on alcohol recently.  He had also last been psychiatrically hospitalized at [facility] approximately a month prior to admission.  He reported drinking somewhere between 1/2 to 1 pint of alcohol weekly.  The patient stated in the psychiatric consultation that the overdose was not a suicide attempt.  He was discharged from the medical unit on 10/10/2019 and transferred to our facility for psychiatric treatment.  He was last admitted to our facility in June of this year.  He endorsed  suicidal ideation at that time.  He stated he was planning on overdosing on pills.  He did admit to a significant history of alcohol abuse.  He stated at that time he was drinking 1/5 of alcohol daily.  He also had a history of blackouts as well as a seizure when using LSD.  He was not discharged with any medications, and was referred for outpatient treatment.  In September of this year and a primary care note it appears he was taken fluoxetine, clonazepam, naltrexone, Sonata.  And a primary care visit on 08/21/2019 again showed clonazepam 1 mg p.o. twice daily, fluoxetine 40 mg p.o. daily, Vyvanse, naltrexone, and the Sonata.  His discharge medications from the medical hospital where the fluoxetine 40 mg p.o. daily, folic acid, thiamine and a nicotine patch.  We did discuss today that his Vyvanse nor his clonazepam would be continued here at our facility.  He is in agreement with that.  He was admitted to the hospital for evaluation and stabilization.  Associated Signs/Symptoms: Depression Symptoms:  depressed mood, anhedonia, insomnia, psychomotor agitation, fatigue, feelings of worthlessness/guilt, difficulty concentrating, hopelessness, suicidal thoughts with specific plan, suicidal attempt, anxiety, disturbed sleep, (Hypo) Manic Symptoms:  Impulsivity, Irritable Mood, Labiality of Mood, Anxiety Symptoms:  Excessive Worry, Psychotic Symptoms:  denied PTSD Symptoms: Negative Total Time spent with patient: 30 minutes  Past Psychiatric History: Patient's last psychiatric hospitalization in our facility was on 03/28/2019.  At that time he was having significant withdrawal symptoms from alcohol and marijuana.  He had a remote history of hallucinogens.  He did 2 outpatient programs in the past as well as one inpatient residential treatment at [facility].  He has been followed at the [facility] as an outpatient.  Is the patient at risk to self? Yes.    Has the patient been a risk to self in the  past 6 months? Yes.    Has the patient been a risk to self within the distant past? No.  Is the patient a risk to others? No.  Has the patient been a risk to others in the past 6 months? No.  Has the patient been a risk to others within the distant past? No.   Prior Inpatient Therapy:   Prior Outpatient Therapy:    Alcohol Screening: 1. How often do you have a drink containing alcohol?: Never 2. How many drinks containing alcohol do you have on a typical day when you are drinking?: 1 or 2 3. How often do you have six or more drinks on one occasion?: Never AUDIT-C Score: 0 4. How often during the last year have you found that you were not able to stop drinking once you had started?: Never 5. How often during the last year have you failed to do what was normally expected from you becasue of drinking?: Never 6. How often during the last year have you needed a first drink in the morning to get yourself going after a heavy drinking session?: Never 7. How often during the last year have you had a feeling of guilt of remorse after drinking?: Never 8. How often during the last year have you been unable to remember what happened the night before because you had been drinking?: Never 9. Have you or someone else been injured as a result of your drinking?: No 10. Has a relative or friend or a doctor or another health worker been concerned about your drinking or suggested you cut down?: No Alcohol Use Disorder Identification Test Final Score (AUDIT): 0 Substance Abuse History in the last 12 months:  Yes.   Consequences of Substance Abuse: Medical Consequences:  Recent overdose that led to hospitalization. Withdrawal Symptoms:   Cramps Diaphoresis Diarrhea Headaches Nausea Tremors Vomiting Previous Psychotropic Medications: Yes  Psychological Evaluations: Yes  Past Medical History:  Past Medical History:  Diagnosis Date  . ADD (attention deficit disorder)   . Drug abuse ([place])   . Medical  history non-contributory     Past Surgical History:  Procedure Laterality Date  . NO PAST SURGERIES     Family History:  Family History  Family history unknown: Yes   Family Psychiatric  History: Patient reported family history of depression, substance issues as well as an unspecified relative with borderline personality disorder. Tobacco Screening:   Social History:  Social History   Substance and Sexual Activity  Alcohol Use Yes   Comment: hx of alcoholism     Social History   Substance and Sexual Activity  Drug Use Yes  . Types: Marijuana    Additional Social History:                           Allergies:   Allergies  Allergen Reactions  . Benadryl [Diphenhydramine Hcl] Other (See Comments)    Caused hyperactivity   Lab Results:  Results for orders placed or performed during the hospital encounter of 10/10/19 (from the past 48 hour(s))  Hemoglobin A1c     Status: Abnormal   Collection Time: 10/11/19  6:20 AM  Result Value Ref Range   Hgb A1c MFr Bld 4.4 (L) 4.8 - 5.6 %  Comment: (NOTE) Pre diabetes:          5.7%-6.4% Diabetes:              >6.4% Glycemic control for   <7.0% adults with diabetes    Mean Plasma Glucose 79.58 mg/dL    Comment: Performed at [facility], [address], [place], [place] [address]  Lipid panel     Status: Abnormal   Collection Time: 10/11/19  6:20 AM  Result Value Ref Range   Cholesterol 249 (H) 0 - 200 mg/dL   Triglycerides 552 <080 mg/dL   HDL 223 >36 mg/dL   Total CHOL/HDL Ratio 2.3 RATIO   VLDL 27 0 - 40 mg/dL   LDL Cholesterol 122 (H) 0 - 99 mg/dL    Comment:        Total Cholesterol/HDL:CHD Risk Coronary Heart Disease Risk Table                     Men   Women  1/2 Average Risk   3.4   3.3  Average Risk       5.0   4.4  2 X Average Risk   9.6   7.1  3 X Average Risk  23.4   11.0        Use the calculated Patient Ratio above and the CHD Risk Table to determine the patient's CHD Risk.         ATP III CLASSIFICATION (LDL):  <100     mg/dL   Optimal  449-753  mg/dL   Near or Above                    Optimal  130-159  mg/dL   Borderline  005-110  mg/dL   High  >211     mg/dL   Very High Performed at [facility], [address] [place], [place] [address]   TSH     Status: Abnormal   Collection Time: 10/11/19  6:20 AM  Result Value Ref Range   TSH 5.482 (H) 0.350 - 4.500 uIU/mL    Comment: Performed by a 3rd Generation assay with a functional sensitivity of <=0.01 uIU/mL. Performed at [facility], [address], [place], [place] [address]     Blood Alcohol level:  Lab Results  Component Value Date   ETH 272 (H) 10/06/2019   ETH <10 03/28/2019    Metabolic Disorder Labs:  Lab Results  Component Value Date   HGBA1C 4.4 (L) 10/11/2019   MPG 79.58 10/11/2019   MPG 85.32 03/28/2019   No results found for: PROLACTIN Lab Results  Component Value Date   CHOL 249 (H) 10/11/2019   TRIG 136 10/11/2019   HDL 107 10/11/2019   CHOLHDL 2.3 10/11/2019   VLDL 27 10/11/2019   LDLCALC 115 (H) 10/11/2019   LDLCALC NOT CALCULATED 03/28/2019    Current Medications: Current Facility-Administered Medications  Medication Dose Route Frequency Provider Last Rate Last Admin  . acetaminophen (TYLENOL) tablet 650 mg  650 mg Oral Q6H PRN Antonieta Pert, MD      . FLUoxetine (PROZAC) capsule 40 mg  40 mg Oral Daily Denzil Magnuson, NP   40 mg at 10/11/19 0830  . folic acid (FOLVITE) tablet 1 mg  1 mg Oral Daily Denzil Magnuson, NP   1 mg at 10/11/19 0831  . hydrOXYzine (ATARAX/VISTARIL) tablet 25 mg  25 mg Oral TID PRN Anike, Adaku C, NP  25 mg at 10/11/19 0832  . ibuprofen (ADVIL) tablet 400 mg  400 mg Oral Q6H PRN Antonieta Pert, MD      . LORazepam (ATIVAN) tablet 1 mg  1 mg Oral Q6H PRN Antonieta Pert, MD      . multivitamin with minerals tablet 1 tablet  1 tablet Oral Daily Denzil Magnuson, NP   1 tablet at 10/11/19 0831  .  nicotine (NICODERM CQ - dosed in mg/24 hours) patch 14 mg  14 mg Transdermal Daily Denzil Magnuson, NP   14 mg at 10/11/19 0831  . thiamine tablet 100 mg  100 mg Oral Daily Denzil Magnuson, NP   100 mg at 10/11/19 0831  . traZODone (DESYREL) tablet 50 mg  50 mg Oral QHS PRN Anike, Adaku C, NP   50 mg at 10/10/19 2119   PTA Medications: Medications Prior to Admission  Medication Sig Dispense Refill Last Dose  . FLUoxetine (PROZAC) 40 MG capsule Take 40 mg by mouth daily.      . folic acid (FOLVITE) 1 MG tablet Take 1 tablet (1 mg total) by mouth daily. 30 tablet 2   . nicotine (NICODERM CQ - DOSED IN MG/24 HOURS) 14 mg/24hr patch Place 1 patch (14 mg total) onto the skin daily. 28 patch 0   . thiamine 100 MG tablet Take 1 tablet (100 mg total) by mouth daily. 30 tablet 2     Musculoskeletal: Strength & Muscle Tone: within normal limits Gait & Station: normal Patient leans: N/A  Psychiatric Specialty Exam: Physical Exam  Review of Systems  Blood pressure 128/73, pulse 89, temperature (!) 97.4 F (36.3 C), temperature source Oral, resp. rate 18, height 5\' 8"  (1.727 m), weight 81.6 kg, SpO2 100 %.Body mass index is 27.37 kg/m.  General Appearance: Disheveled  Eye Contact:  Fair  Speech:  Normal Rate  Volume:  Decreased  Mood:  Anxious and Depressed  Affect:  Congruent  Thought Process:  Coherent and Descriptions of Associations: Intact  Orientation:  Full (Time, Place, and Person)  Thought Content:  Logical  Suicidal Thoughts:  No  Homicidal Thoughts:  No  Memory:  Immediate;   Fair Recent;   Fair Remote;   Fair  Judgement:  Impaired  Insight:  Lacking  Psychomotor Activity:  Decreased  Concentration:  Concentration: Fair and Attention Span: Fair  Recall:  of Knowledge:  Fair  Language:  Fair  Akathisia:  Negative  Handed:  Right  AIMS (if indicated):     Assets:  Desire for Improvement Resilience Social Support  ADL's:  Intact  Cognition:  WNL  Sleep:   Number of Hours: 5.25    Treatment Plan Summary: Daily contact with patient to assess and evaluate symptoms and progress in treatment, Medication management and Plan : Patient is seen and examined.  Patient is a 29 year old male with the above-stated past psychiatric history was transferred to our facility for continued treatment of alcohol dependence, depression (major depression versus substance-induced mood disorder) as well as attention deficit hyperactivity disorder.  He will be admitted to the hospital.  He will be integrated in the milieu.  He will be encouraged to attend groups.  His fluoxetine will be continued at 40 mg p.o. daily.  Given his 4-day stay in the medical hospital I doubt that he will have any further withdrawal symptoms.  His vital signs are stable, and he is afebrile.  No benzodiazepines will be placed on a regular basis, but I will  place 1 mg lorazepam p.o. every 6 hours as needed withdrawal symptoms given his previous treatment with the clonazepam.  We will also not restart his Vyvanse at this point, nor his Sonata.  He will have trazodone 50 mg p.o. nightly as needed insomnia if necessary.  Review of his laboratories showed normal liver function enzymes, normal electrolytes.  Lipids were stable.  His CBC was essentially normal.  His PT and INR were within normal limits.  On admission his Tylenol level and salicylate were negative.  His TSH was essentially normal at 5.482.  His blood alcohol on admission was 272.  Drug screen was negative.  3 on admission was negative.  His EKG from 12/29 appeared to have a junctional rhythm with a normal QTc interval.  Observation Level/Precautions:  Detox 15 minute checks Seizure  Laboratory:  Chemistry Profile  Psychotherapy:    Medications:    Consultations:    Discharge Concerns:    Estimated LOS:  Other:     Physician Treatment Plan for Primary Diagnosis: <principal problem not specified> Long Term Goal(s): Improvement in symptoms so  as ready for discharge  Short Term Goals: Ability to identify changes in lifestyle to reduce recurrence of condition will improve, Ability to verbalize feelings will improve, Ability to disclose and discuss suicidal ideas, Ability to demonstrate self-control will improve, Ability to identify and develop effective coping behaviors will improve, Ability to maintain clinical measurements within normal limits will improve, Compliance with prescribed medications will improve and Ability to identify triggers associated with substance abuse/mental health issues will improve  Physician Treatment Plan for Secondary Diagnosis: Active Problems:   MDD (major depressive disorder)  Long Term Goal(s): Improvement in symptoms so as ready for discharge  Short Term Goals: Ability to identify changes in lifestyle to reduce recurrence of condition will improve, Ability to verbalize feelings will improve, Ability to disclose and discuss suicidal ideas, Ability to demonstrate self-control will improve, Ability to identify and develop effective coping behaviors will improve, Ability to maintain clinical measurements within normal limits will improve, Compliance with prescribed medications will improve and Ability to identify triggers associated with substance abuse/mental health issues will improve  I certify that inpatient services furnished can reasonably be expected to improve the patient's condition.    Antonieta Pert, MD 1/1/20213:08 PM
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
## 2019-10-11 NOTE — Progress Notes (Addendum)
Pt denies SI/HI. Pt denies AVH. Pt denies having any withdrawal symptoms. Pt appeared anxious on approach and was given Vistaril for anxiety.     10/11/19 0900  Psych Admission Type (Psych Patients Only)  Admission Status Involuntary  Psychosocial Assessment  Patient Complaints None  Eye Contact Fair  Facial Expression Flat  Affect Appropriate to circumstance  Speech Logical/coherent  Interaction Assertive  Motor Activity Slow  Appearance/Hygiene Unremarkable  Behavior Characteristics Appropriate to situation  Mood Anxious  Thought Process  Coherency WDL  Content WDL  Delusions None reported or observed  Perception WDL  Hallucination None reported or observed  Judgment WDL  Confusion None  Danger to Self  Current suicidal ideation? Denies  Danger to Others  Danger to Others None reported or observed

## 2019-10-11 NOTE — Tx Team (Signed)
Interdisciplinary Treatment and Diagnostic Plan Update  10/11/2019 Time of Session:  Brett Sanford MRN: [id number]  Principal Diagnosis: <principal problem not specified>  Secondary Diagnoses: Active Problems:   MDD (major depressive disorder)   Current Medications:  Current Facility-Administered Medications  Medication Dose Route Frequency Provider Last Rate Last Admin  . FLUoxetine (PROZAC) capsule 40 mg  40 mg Oral Daily Mordecai Maes, NP   40 mg at 10/11/19 0830  . folic acid (FOLVITE) tablet 1 mg  1 mg Oral Daily Mordecai Maes, NP   1 mg at 10/11/19 0831  . hydrOXYzine (ATARAX/VISTARIL) tablet 25 mg  25 mg Oral TID PRN Anike, Adaku C, NP   25 mg at 10/11/19 0832  . multivitamin with minerals tablet 1 tablet  1 tablet Oral Daily Mordecai Maes, NP   1 tablet at 10/11/19 0831  . nicotine (NICODERM CQ - dosed in mg/24 hours) patch 14 mg  14 mg Transdermal Daily Mordecai Maes, NP   14 mg at 10/11/19 0831  . thiamine tablet 100 mg  100 mg Oral Daily Mordecai Maes, NP   100 mg at 10/11/19 0831  . traZODone (DESYREL) tablet 50 mg  50 mg Oral QHS PRN Anike, Adaku C, NP   50 mg at 10/10/19 2119   PTA Medications: Medications Prior to Admission  Medication Sig Dispense Refill Last Dose  . FLUoxetine (PROZAC) 40 MG capsule Take 40 mg by mouth daily.      . folic acid (FOLVITE) 1 MG tablet Take 1 tablet (1 mg total) by mouth daily. 30 tablet 2   . nicotine (NICODERM CQ - DOSED IN MG/24 HOURS) 14 mg/24hr patch Place 1 patch (14 mg total) onto the skin daily. 28 patch 0   . thiamine 100 MG tablet Take 1 tablet (100 mg total) by mouth daily. 30 tablet 2     Patient Stressors: Health problems Occupational concerns Substance abuse  Patient Strengths: Ability for insight Capable of independent living Communication skills Supportive family/friends  Treatment Modalities: Medication Management, Group therapy, Case management,  1 to 1 session with clinician, Psychoeducation,  Recreational therapy.   Physician Treatment Plan for Primary Diagnosis: <principal problem not specified> Long Term Goal(s):     Short Term Goals:    Medication Management: Evaluate patient's response, side effects, and tolerance of medication regimen.  Therapeutic Interventions: 1 to 1 sessions, Unit Group sessions and Medication administration.  Evaluation of Outcomes: Not Met  Physician Treatment Plan for Secondary Diagnosis: Active Problems:   MDD (major depressive disorder)  Long Term Goal(s):     Short Term Goals:       Medication Management: Evaluate patient's response, side effects, and tolerance of medication regimen.  Therapeutic Interventions: 1 to 1 sessions, Unit Group sessions and Medication administration.  Evaluation of Outcomes: Not Met   RN Treatment Plan for Primary Diagnosis: <principal problem not specified> Long Term Goal(s): Knowledge of disease and therapeutic regimen to maintain health will improve  Short Term Goals: Ability to participate in decision making will improve, Ability to verbalize feelings will improve, Ability to disclose and discuss suicidal ideas, Ability to identify and develop effective coping behaviors will improve and Compliance with prescribed medications will improve  Medication Management: RN will administer medications as ordered by provider, will assess and evaluate patient's response and provide education to patient for prescribed medication. RN will report any adverse and/or side effects to prescribing provider.  Therapeutic Interventions: 1 on 1 counseling sessions, Psychoeducation, Medication administration, Evaluate responses to treatment,  Monitor vital signs and CBGs as ordered, Perform/monitor CIWA, COWS, AIMS and Fall Risk screenings as ordered, Perform wound care treatments as ordered.  Evaluation of Outcomes: Not Met   LCSW Treatment Plan for Primary Diagnosis: <principal problem not specified> Long Term Goal(s): Safe  transition to appropriate next level of care at discharge, Engage patient in therapeutic group addressing interpersonal concerns.  Short Term Goals: Engage patient in aftercare planning with referrals and resources  Therapeutic Interventions: Assess for all discharge needs, 1 to 1 time with Social worker, Explore available resources and support systems, Assess for adequacy in community support network, Educate family and significant other(s) on suicide prevention, Complete Psychosocial Assessment, Interpersonal group therapy.  Evaluation of Outcomes: Not Met   Progress in Treatment: Attending groups: No. Participating in groups: No. Taking medication as prescribed: Yes. Toleration medication: Yes. Family/Significant other contact made: No, will contact:  if patient consents to collateral contacts Patient understands diagnosis: Yes. Discussing patient identified problems/goals with staff: Yes. Medical problems stabilized or resolved: Yes. Denies suicidal/homicidal ideation: Yes. Issues/concerns per patient self-inventory: No. Other:   New problem(s) identified: None   New Short Term/Long Term Goal(s): Detox, medication stabilization, elimination of SI thoughts, development of comprehensive mental wellness plan.    Patient Goals:    Discharge Plan or Barriers: Patient recently admitted. CSW will continue to follow and assess for appropriate referrals and possible discharge planning.    Reason for Continuation of Hospitalization: Depression Medication stabilization Suicidal ideation Other; describe Substance use/abuse  Estimated Length of Stay: 3-5 days   Attendees: Patient: 10/11/2019 9:51 AM  Physician: Dr. Myles Lipps, MD 10/11/2019 9:51 AM  Nursing: Sharl Ma.Viona Gilmore, RN 10/11/2019 9:51 AM  RN Care Manager: 10/11/2019 9:51 AM  Social Worker: Radonna Ricker, LCSW  10/11/2019 9:51 AM  Recreational Therapist:  10/11/2019 9:51 AM  Other:  10/11/2019 9:51 AM  Other:  10/11/2019 9:51 AM  Other:  10/11/2019 9:51 AM    Scribe for Treatment Team: Marylee Floras, [place] 10/11/2019 9:51 AM

## 2019-10-11 NOTE — Progress Notes (Signed)
   10/11/19 2200  Psych Admission Type (Psych Patients Only)  Admission Status Involuntary  Psychosocial Assessment  Patient Complaints None  Eye Contact Fair  Facial Expression Flat  Affect Appropriate to circumstance  Speech Logical/coherent  Interaction Assertive  Motor Activity Slow  Appearance/Hygiene Unremarkable  Behavior Characteristics Appropriate to situation  Mood Anxious  Thought Process  Coherency WDL  Content WDL  Delusions None reported or observed  Perception WDL  Hallucination None reported or observed  Judgment Impaired  Confusion None  Danger to Self  Current suicidal ideation? Denies  Danger to Others  Danger to Others None reported or observed  Pt. States that he is doing well, he attended evening group and was visible in the milieu.  Pt. Denies any new complaints or needs and Was medicated as ordered.  Safety checks maintained.

## 2019-10-11 NOTE — Progress Notes (Signed)
CSW spoke with Mr.Ringer at the [facility], patient is able to resume IOP services at discharge. Sessions are Monday, Wednesday, and Friday at 10:30am. Per Mr.Ringer, patient's medication management appointment will occur during the date he resumes IOP.  Enid Cutter, LCSW-A Clinical Social Worker

## 2019-10-11 NOTE — [facility] Suicide Risk Assessment (Signed)
[facility] INPATIENT:  Family/Significant Other Suicide Prevention Education  Suicide Prevention Education:  Patient Refusal for Family/Significant Other Suicide Prevention Education: The patient Brett Sanford has refused to provide written consent for family/significant other to be provided Family/Significant Other Suicide Prevention Education during admission and/or prior to discharge.  Physician notified.   SPE reviewed with patient.  Darreld Mclean 10/11/2019, 10:11 AM

## 2019-10-11 NOTE — Progress Notes (Signed)
D.  Pt pleasant on approach, no complaints voiced at this time.  Pt was positive for evening wrap up group, observed interacting appropriately with peers on the unit.  Pt denies SI /HI/AVH at this time.  A.  Support and encouragement offered, medication given as ordered  R.  Pt remains safe on the unit, will continue to monitor.

## 2019-10-12 NOTE — Progress Notes (Signed)
   10/12/19 2223  Charting Type  Charting Type Shift assessment  Orders Chart Check (once per shift) Completed  Safety Check Verification  Has the RN verified the 15 minute safety check completion? Yes  Neurological  Neuro (WDL) WDL  HEENT  HEENT (WDL) WDL  Respiratory  Respiratory (WDL) WDL  Vascular  Vascular (WDL) WDL  Integumentary  Integumentary (WDL) WDL  Braden Scale (Ages 8 and up)  Sensory Perceptions 4  Moisture 4  Activity 4  Mobility 4  Nutrition 3  Friction and Shear 3  Braden Scale Score 22  Musculoskeletal  Musculoskeletal (WDL) WDL  Gastrointestinal  Gastrointestinal (WDL) WDL  Stool Characteristics  Bowel Incontinence No  GU Assessment  Genitourinary (WDL) WDL  Urine Characteristics  Urinary Incontinence No  D: Patient in dayroom reports he had a good day. Glad to be back on medication. A: Medications administered as prescribed. Support and encouragement provided as needed.  R: Patient remains safe on the unit. Will continue to monitor for safety and stability.

## 2019-10-12 NOTE — Progress Notes (Signed)
[facility] MD Progress Note  10/12/2019 2:56 PM STEVE YOUNGBERG  MRN:  [id number]  Subjective: Janis reports, "I was off my medications for about a month, got more depressed. Things went out of hand, I attempted suicide by overdose. But, I'm feeling better today. I slept well last night. I have started my medications again, doing well on them. I will resume treatments with [facility] after discharge. I do not have any complaints today".  Objective: Patient is a 29 year old male with a past psychiatric history significant of alcohol dependence who originally presented to the [facility] emergency department on 10/06/2019 secondary to an overdose of Benadryl. Patient arrived in the emergency room with altered mental status. He was found by his mother, and transported to the emergency department. He was admitted to the medicine side of the hospital. The patient admitted on history and physical to the [facility] medical unit that he had not been compliant with medications and drinks alcohol essentially on every day basis. He was initially somewhat delirious on admission but this cleared during the course of the hospitalization. He was noted to be confused and hallucinating during the initial evaluation. Sy is seen, chart reviewed. The chart findings discussed with the treatment team. He presents alert, oriented & aware of situation. He says he got very depressed after not being on his medications for about a month, things went out of hand, he attempted to kill himself by overdose. He says he is doing much better now that he has resumed his medications again. He denies any side effects. He denies any complaints. He denies any SIHI, AVH, delusional thoughts or paranoia. He does not appear to be responding to any internal stimuli. [place] is in agreement to continue his current plan of care as already in progress.  Principal Problem: MDD (major depressive disorder)  Diagnosis: Principal  Problem:   MDD (major depressive disorder)  Total Time spent with patient: 25 minutes  Past Psychiatric History: See H&P  Past Medical History:  Past Medical History:  Diagnosis Date  . ADD (attention deficit disorder)   . Drug abuse (HCC)   . Medical history non-contributory     Past Surgical History:  Procedure Laterality Date  . NO PAST SURGERIES     Family History:  Family History  Family history unknown: Yes   Family Psychiatric  History: See H&P  Social History:  Social History   Substance and Sexual Activity  Alcohol Use Yes   Comment: hx of alcoholism     Social History   Substance and Sexual Activity  Drug Use Yes  . Types: Marijuana    Social History   Socioeconomic History  . Marital status: Single    Spouse name: Not on file  . Number of children: Not on file  . Years of education: Not on file  . Highest education level: Not on file  Occupational History  . Not on file  Tobacco Use  . Smoking status: Current Every Day Smoker  . Smokeless tobacco: Never Used  Substance and Sexual Activity  . Alcohol use: Yes    Comment: hx of alcoholism  . Drug use: Yes    Types: Marijuana  . Sexual activity: Not on file  Other Topics Concern  . Not on file  Social History Narrative  . Not on file   Social Determinants of Health   Financial Resource Strain:   . Difficulty of Paying Living Expenses: Not on file  Food Insecurity:   .  Worried About Programme researcher, broadcasting/film/video in the Last Year: Not on file  . Ran Out of Food in the Last Year: Not on file  Transportation Needs:   . Lack of Transportation (Medical): Not on file  . Lack of Transportation (Non-Medical): Not on file  Physical Activity:   . Days of Exercise per Week: Not on file  . Minutes of Exercise per Session: Not on file  Stress:   . Feeling of Stress : Not on file  Social Connections:   . Frequency of Communication with Friends and Family: Not on file  . Frequency of Social Gatherings with  Friends and Family: Not on file  . Attends Religious Services: Not on file  . Active Member of Clubs or Organizations: Not on file  . Attends Banker Meetings: Not on file  . Marital Status: Not on file   Additional Social History:   Sleep: Good  Appetite:  Good  Current Medications: Current Facility-Administered Medications  Medication Dose Route Frequency Provider Last Rate Last Admin  . acetaminophen (TYLENOL) tablet 650 mg  650 mg Oral Q6H PRN Antonieta Pert, MD      . FLUoxetine (PROZAC) capsule 40 mg  40 mg Oral Daily Denzil Magnuson, NP   40 mg at 10/12/19 8527  . folic acid (FOLVITE) tablet 1 mg  1 mg Oral Daily Denzil Magnuson, NP   1 mg at 10/12/19 7824  . hydrOXYzine (ATARAX/VISTARIL) tablet 25 mg  25 mg Oral TID PRN Anike, Adaku C, NP   25 mg at 10/12/19 0832  . ibuprofen (ADVIL) tablet 400 mg  400 mg Oral Q6H PRN Antonieta Pert, MD   400 mg at 10/11/19 1511  . LORazepam (ATIVAN) tablet 1 mg  1 mg Oral Q6H PRN Antonieta Pert, MD      . multivitamin with minerals tablet 1 tablet  1 tablet Oral Daily Denzil Magnuson, NP   1 tablet at 10/12/19 [phone]  . nicotine (NICODERM CQ - dosed in mg/24 hours) patch 14 mg  14 mg Transdermal Daily Denzil Magnuson, NP   14 mg at 10/12/19 0834  . thiamine tablet 100 mg  100 mg Oral Daily Denzil Magnuson, NP   100 mg at 10/12/19 6144  . traZODone (DESYREL) tablet 50 mg  50 mg Oral QHS PRN Anike, Adaku C, NP   50 mg at 10/11/19 2136   Lab Results:  Results for orders placed or performed during the hospital encounter of 10/10/19 (from the past 48 hour(s))  Hemoglobin A1c     Status: Abnormal   Collection Time: 10/11/19  6:20 AM  Result Value Ref Range   Hgb A1c MFr Bld 4.4 (L) 4.8 - 5.6 %    Comment: (NOTE) Pre diabetes:          5.7%-6.4% Diabetes:              >6.4% Glycemic control for   <7.0% adults with diabetes    Mean Plasma Glucose 79.58 mg/dL    Comment: Performed at [facility], [address], [place], [place] [address]  Lipid panel     Status: Abnormal   Collection Time: 10/11/19  6:20 AM  Result Value Ref Range   Cholesterol 249 (H) 0 - 200 mg/dL   Triglycerides 086 <761 mg/dL   HDL 950 >93 mg/dL   Total CHOL/HDL Ratio 2.3 RATIO   VLDL 27 0 - 40 mg/dL   LDL Cholesterol 267 (H) 0 - 99  mg/dL    Comment:        Total Cholesterol/HDL:CHD Risk Coronary Heart Disease Risk Table                     Men   Women  1/2 Average Risk   3.4   3.3  Average Risk       5.0   4.4  2 X Average Risk   9.6   7.1  3 X Average Risk  23.4   11.0        Use the calculated Patient Ratio above and the CHD Risk Table to determine the patient's CHD Risk.        ATP III CLASSIFICATION (LDL):  <100     mg/dL   Optimal  100-129  mg/dL   Near or Above                    Optimal  130-159  mg/dL   Borderline  160-189  mg/dL   High  >190     mg/dL   Very High Performed at [facility] [address], [place], [place] [address]   TSH     Status: Abnormal   Collection Time: 10/11/19  6:20 AM  Result Value Ref Range   TSH 5.482 (H) 0.350 - 4.500 uIU/mL    Comment: Performed by a 3rd Generation assay with a functional sensitivity of <=0.01 uIU/mL. Performed at [facility], [place] [address] [place], [place] [address]    Blood Alcohol level:  Lab Results  Component Value Date   ETH 272 (H) 10/06/2019   ETH <10 82/95/6213   Metabolic Disorder Labs: Lab Results  Component Value Date   HGBA1C 4.4 (L) 10/11/2019   MPG 79.58 10/11/2019   MPG 85.32 03/28/2019   No results found for: PROLACTIN Lab Results  Component Value Date   CHOL 249 (H) 10/11/2019   TRIG 136 10/11/2019   HDL 107 10/11/2019   CHOLHDL 2.3 10/11/2019   VLDL 27 10/11/2019   LDLCALC 115 (H) 10/11/2019   LDLCALC NOT CALCULATED 03/28/2019   Physical Findings: AIMS: Facial and Oral Movements Muscles of Facial Expression: None, normal Lips and Perioral Area: None, normal Jaw:  None, normal Tongue: None, normal,Extremity Movements Upper (arms, wrists, hands, fingers): None, normal Lower (legs, knees, ankles, toes): None, normal, Trunk Movements Neck, shoulders, hips: None, normal, Overall Severity Severity of abnormal movements (highest score from questions above): None, normal Incapacitation due to abnormal movements: None, normal Patient's awareness of abnormal movements (rate only patient's report): No Awareness, Dental Status Current problems with teeth and/or dentures?: No Does patient usually wear dentures?: No  CIWA:  CIWA-Ar Total: 4 COWS:     Musculoskeletal: Strength & Muscle Tone: within normal limits Gait & Station: normal Patient leans: N/A  Psychiatric Specialty Exam: Physical Exam  Nursing note and vitals reviewed. Constitutional: He is oriented to person, place, and time. He appears well-developed.  Cardiovascular: Normal rate.  Respiratory: Effort normal.  Genitourinary:    Genitourinary Comments: Deferred   Musculoskeletal:        General: Normal range of motion.     Cervical back: Normal range of motion.  Neurological: He is alert and oriented to person, place, and time.  Skin: Skin is warm and dry.    Review of Systems  Constitutional: Negative for chills, diaphoresis and fever.  HENT: Negative for congestion, rhinorrhea, sneezing and sore throat.   Respiratory: Negative for cough, shortness  of breath and wheezing.   Cardiovascular: Negative for chest pain and palpitations.  Gastrointestinal: Negative for diarrhea, nausea and vomiting.  Musculoskeletal: Negative for myalgias.  Skin: Negative for color change.  Neurological: Negative for dizziness and headaches.  Psychiatric/Behavioral: Positive for dysphoric mood ("Improving"). Negative for agitation, behavioral problems, confusion, decreased concentration, hallucinations, self-injury (Hx of by OD), sleep disturbance and suicidal ideas. The patient is not nervous/anxious (Stable)  and is not hyperactive.     Blood pressure 120/83, pulse 89, temperature (!) 97.4 F (36.3 C), temperature source Oral, resp. rate 18, height 5\' 8"  (1.727 m), weight 81.6 kg, SpO2 100 %.Body mass index is 27.37 kg/m.  General Appearance: Disheveled  Eye Contact:  Fair  Speech:  Normal Rate  Volume:  Decreased  Mood: "Improving"  Affect:  Congruent  Thought Process:  Coherent and Descriptions of Associations: Intact  Orientation:  Full (Time, Place, and Person)  Thought Content:  Logical  Suicidal Thoughts:  No  Homicidal Thoughts:  No  Memory:  Immediate;   Fair Recent;   Fair Remote;   Fair  Judgement:  Impaired  Insight:  Lacking  Psychomotor Activity:  Decreased  Concentration:  Concentration: Fair and Attention Span: Fair  Recall:  of Knowledge:  Fair  Language:  Fair  Akathisia:  Negative  Handed:  Right  AIMS (if indicated):     Assets:  Desire for Improvement Resilience Social Support  ADL's:  Intact  Cognition:  WNL    Sleep:  Number of Hours: 6.25   Treatment Plan Summary: Daily contact with patient to assess and evaluate symptoms and progress in treatment and Medication management.  -Continue inpatient hospitalization.  -Will continue today 10/12/2019 plan as below except where it is noted.  -Depression   -Continue Prozac 40 mg po qd.  -Anxiety  -Continue atarax 25 mg po q8h prn.             -Continue Lorazepam 1 mg po Q 6 hrs prn for CIWA >10.             -Continue Folic acid 1 mg po for low folate replacement.             -Continue thiamine 100 mg po daily for thiamine deficiency.  -Insomnia  -Continue Trazodone 50 mg po qhs prn.  -Encourage participation in groups and therapeutic milieu  -Disposition planning will be ongoing  12/10/2019, NP, PMHNP, FNP-BC 10/12/2019, 2:56 PM

## 2019-10-12 NOTE — Progress Notes (Signed)
D. Pt is calm and cooperative, friendly during interactions- is visible in the milieu interacting well with peers. Per pt's self inventory, pt rated his depression, hopelessness and anxiety all 3's. Pt writes that his goal today is "planning discharge". Pt currently denies SI/HI and AVH A. Labs and vitals monitored. Pt compliant with medications. Pt supported emotionally and encouraged to express concerns and ask questions.   R. Pt remains safe with 15 minute checks. Will continue POC.

## 2019-10-12 NOTE — Progress Notes (Signed)
[place] NOVEL CORONAVIRUS (COVID-19) DAILY CHECK-OFF SYMPTOMS - answer yes or no to each - every day NO YES  Have you had a fever in the past 24 hours?  . Fever (Temp > 37.80C / 100F) X   Have you had any of these symptoms in the past 24 hours? . New Cough .  Sore Throat  .  Shortness of Breath .  Difficulty Breathing .  Unexplained Body Aches   X   Have you had any one of these symptoms in the past 24 hours not related to allergies?   . Runny Nose .  Nasal Congestion .  Sneezing   X   If you have had runny nose, nasal congestion, sneezing in the past 24 hours, has it worsened?  X   EXPOSURES - check yes or no X   Have you traveled outside the state in the past 14 days?  X   Have you been in contact with someone with a confirmed diagnosis of COVID-19 or PUI in the past 14 days without wearing appropriate PPE?  X   Have you been living in the same home as a person with confirmed diagnosis of COVID-19 or a PUI (household contact)?    X   Have you been diagnosed with COVID-19?    X              What to do next: Answered NO to all: Answered YES to anything:   Proceed with unit schedule Follow the [facility] Inpatient Flowsheet.   

## 2019-10-13 MED ORDER — ALUM & MAG HYDROXIDE-SIMETH 200-200-20 MG/5ML PO SUSP
30.0000 mL | Freq: Four times a day (QID) | ORAL | Status: DC | PRN
  Administered 2019-10-13 (×2): 30 mL via ORAL

## 2019-10-13 NOTE — [facility] Group Notes (Signed)
[facility] LCSW Group Therapy Note  Date/Time:  10/13/2019   Type of Therapy and Topic:  Group Therapy:  Healthy and Unhealthy Supports  Participation Level:  Active   Description of Group:  Patients in this group were introduced to the idea of adding a variety of healthy supports to address the various needs in their lives.Patients discussed what additional healthy supports could be helpful in their recovery and wellness after discharge in order to prevent future hospitalizations.   An emphasis was placed on using counselor, doctor, therapy groups, 12-step groups, and problem-specific support groups to expand supports.    Therapeutic Goals:   1)  discuss importance of adding supports to stay well once out of the hospital  2)  compare healthy versus unhealthy supports and identify some examples of each  3)  generate ideas and descriptions of healthy supports that can be added  4)  offer mutual support about how to address unhealthy supports  5)  encourage active participation in and adherence to discharge plan    Summary of Patient Progress:  The patient stated that current healthy supports in his life are his mother and some friends while current unhealthy supports include his brother who is resentful and blames others, and with whom he described appropriate boundaries.  The patient expressed a willingness to add doctor and therapy as support(s) to help in his recovery journey.   Therapeutic Modalities:   Motivational Interviewing Brief Solution-Focused Therapy  Ambrose Mantle, LCSW

## 2019-10-13 NOTE — Progress Notes (Addendum)
Pt c/o anxiety this am and requested Vistaril. Vistaril administered per the pt's request for anxiety. Pt denies SI/HI. Pt reports decreased depression today.    10/13/19 0900  Psych Admission Type (Psych Patients Only)  Admission Status Involuntary  Psychosocial Assessment  Patient Complaints None  Eye Contact Fair  Facial Expression Animated  Affect Anxious  Speech Logical/coherent  Interaction Assertive  Motor Activity Slow  Appearance/Hygiene Unremarkable  Behavior Characteristics Anxious  Mood Anxious  Aggressive Behavior  Effect No apparent injury  Thought Process  Coherency WDL  Content WDL  Delusions None reported or observed  Perception WDL  Hallucination None reported or observed  Judgment Impaired  Confusion None  Danger to Self  Current suicidal ideation? Denies  Danger to Others  Danger to Others None reported or observed

## 2019-10-13 NOTE — Progress Notes (Signed)
Pt stated that his goal was to think positive and he stated goal has been met   Pt stated his relationship with his family is improving since being admitted. Pt stated he felt better about himself today. Pt rated his overall day a 8 out of 10. Pt stated his appetite is good today. Pt stated his sleep last night was good.   Pt deny auditory or visual hallucinations. Pt stated that he has no thoughts of harming himself or others. Pt stated he would alert staff if anything changes.

## 2019-10-13 NOTE — Progress Notes (Signed)
[facility] MD Progress Note  10/13/2019 2:09 PM Brett Sanford  MRN:  [id number]  Subjective: Brett Sanford reports, "I'm doing better today. I slept well last night. I'm up & about relating well to the other patients like me. I'm doing well on the medications, no side effects to report. I did talk to my mother today, she says she does not know whether to allow me to come back to her home after discharge after what I tried to do. She will be coming to visit me this evening, I will know more then. I live with her, if I cannot go back to her home after discharge, I will need to start making alternative living arrangements. My hope is to get into a long term substance abuse treatment program after discharge. I need it. My mother's home is also kind of toxic for me because my brother lives there too. He has no respect for me or my mother. He calls my mother bad names like a 'witch'. I'm looking forward to tomorrow to meet with the social workers to see what can be worked out for me in terms of treatment program".  Objective: Patient is a 29 year old male with a past psychiatric history significant of alcohol dependence who originally presented to the [facility] emergency department on 10/06/2019 secondary to an overdose of Benadryl. Patient arrived in the emergency room with altered mental status. He was found by his mother, and transported to the emergency department. He was admitted to the medicine side of the hospital. The patient admitted on history and physical to the [facility] medical unit that he had not been compliant with medications and drinks alcohol essentially on every day basis. He was initially somewhat delirious on admission but this cleared during the course of the hospitalization. He was noted to be confused and hallucinating during the initial evaluation. Brett Sanford is seen, chart reviewed. The chart findings discussed with the treatment team. He presents alert, oriented & aware of  situation. He says he got very depressed after not being on his medications for about a month, things went out of hand, he attempted to kill himself by overdose. He says he is doing much better now that he has resumed his medications again. He denies any side effects. He denies any complaints. He denies any SIHI, AVH, delusional thoughts or paranoia. He does not appear to be responding to any internal stimuli. He is hoping on getting into a long term substance abuse treatment program after discharge. (See the subjective reports above). Brett Sanford is in agreement to continue his current plan of care as already in progress.  Principal Problem: MDD (major depressive disorder)  Diagnosis: Principal Problem:   MDD (major depressive disorder)  Total Time spent with patient: 15 minutes  Past Psychiatric History: See H&P  Past Medical History:  Past Medical History:  Diagnosis Date  . ADD (attention deficit disorder)   . Drug abuse (HCC)   . Medical history non-contributory     Past Surgical History:  Procedure Laterality Date  . NO PAST SURGERIES     Family History:  Family History  Family history unknown: Yes   Family Psychiatric  History: See H&P  Social History:  Social History   Substance and Sexual Activity  Alcohol Use Yes   Comment: hx of alcoholism     Social History   Substance and Sexual Activity  Drug Use Yes  . Types: Marijuana    Social History   Socioeconomic History  .  Marital status: Single    Spouse name: Not on file  . Number of children: Not on file  . Years of education: Not on file  . Highest education level: Not on file  Occupational History  . Not on file  Tobacco Use  . Smoking status: Current Every Day Smoker  . Smokeless tobacco: Never Used  Substance and Sexual Activity  . Alcohol use: Yes    Comment: hx of alcoholism  . Drug use: Yes    Types: Marijuana  . Sexual activity: Not on file  Other Topics Concern  . Not on file  Social History  Narrative  . Not on file   Social Determinants of Health   Financial Resource Strain:   . Difficulty of Paying Living Expenses: Not on file  Food Insecurity:   . Worried About Programme researcher, broadcasting/film/video in the Last Year: Not on file  . Ran Out of Food in the Last Year: Not on file  Transportation Needs:   . Lack of Transportation (Medical): Not on file  . Lack of Transportation (Non-Medical): Not on file  Physical Activity:   . Days of Exercise per Week: Not on file  . Minutes of Exercise per Session: Not on file  Stress:   . Feeling of Stress : Not on file  Social Connections:   . Frequency of Communication with Friends and Family: Not on file  . Frequency of Social Gatherings with Friends and Family: Not on file  . Attends Religious Services: Not on file  . Active Member of Clubs or Organizations: Not on file  . Attends Banker Meetings: Not on file  . Marital Status: Not on file   Additional Social History:   Sleep: Good  Appetite:  Good  Current Medications: Current Facility-Administered Medications  Medication Dose Route Frequency Provider Last Rate Last Admin  . acetaminophen (TYLENOL) tablet 650 mg  650 mg Oral Q6H PRN Antonieta Pert, MD      . alum & mag hydroxide-simeth (MAALOX/MYLANTA) 200-200-20 MG/5ML suspension 30 mL  30 mL Oral Q6H PRN Armandina Stammer I, NP      . FLUoxetine (PROZAC) capsule 40 mg  40 mg Oral Daily Denzil Magnuson, NP   40 mg at 10/13/19 0802  . folic acid (FOLVITE) tablet 1 mg  1 mg Oral Daily Denzil Magnuson, NP   1 mg at 10/13/19 0802  . hydrOXYzine (ATARAX/VISTARIL) tablet 25 mg  25 mg Oral TID PRN Anike, Adaku C, NP   25 mg at 10/13/19 0802  . ibuprofen (ADVIL) tablet 400 mg  400 mg Oral Q6H PRN Antonieta Pert, MD   400 mg at 10/11/19 1511  . LORazepam (ATIVAN) tablet 1 mg  1 mg Oral Q6H PRN Antonieta Pert, MD      . multivitamin with minerals tablet 1 tablet  1 tablet Oral Daily Denzil Magnuson, NP   1 tablet at 10/13/19  0802  . nicotine (NICODERM CQ - dosed in mg/24 hours) patch 14 mg  14 mg Transdermal Daily Denzil Magnuson, NP   14 mg at 10/13/19 0802  . thiamine tablet 100 mg  100 mg Oral Daily Denzil Magnuson, NP   100 mg at 10/13/19 0802  . traZODone (DESYREL) tablet 50 mg  50 mg Oral QHS PRN Anike, Adaku C, NP   50 mg at 10/12/19 2102   Lab Results:  No results found for this or any previous visit (from the past 48 hour(s)). Blood Alcohol level:  Lab Results  Component Value Date   ETH 272 (H) 10/06/2019   ETH <10 46/65/9935   Metabolic Disorder Labs: Lab Results  Component Value Date   HGBA1C 4.4 (L) 10/11/2019   MPG 79.58 10/11/2019   MPG 85.32 03/28/2019   No results found for: PROLACTIN Lab Results  Component Value Date   CHOL 249 (H) 10/11/2019   TRIG 136 10/11/2019   HDL 107 10/11/2019   CHOLHDL 2.3 10/11/2019   VLDL 27 10/11/2019   LDLCALC 115 (H) 10/11/2019   LDLCALC NOT CALCULATED 03/28/2019   Physical Findings: AIMS: Facial and Oral Movements Muscles of Facial Expression: None, normal Lips and Perioral Area: None, normal Jaw: None, normal Tongue: None, normal,Extremity Movements Upper (arms, wrists, hands, fingers): None, normal Lower (legs, knees, ankles, toes): None, normal, Trunk Movements Neck, shoulders, hips: None, normal, Overall Severity Severity of abnormal movements (highest score from questions above): None, normal Incapacitation due to abnormal movements: None, normal Patient's awareness of abnormal movements (rate only patient's report): No Awareness, Dental Status Current problems with teeth and/or dentures?: No Does patient usually wear dentures?: No  CIWA:  CIWA-Ar Total: 4 COWS:     Musculoskeletal: Strength & Muscle Tone: within normal limits Gait & Station: normal Patient leans: N/A  Psychiatric Specialty Exam: Physical Exam  Nursing note and vitals reviewed. Constitutional: He is oriented to person, place, and time. He appears  well-developed.  Cardiovascular: Normal rate.  Respiratory: Effort normal.  Genitourinary:    Genitourinary Comments: Deferred   Musculoskeletal:        General: Normal range of motion.     Cervical back: Normal range of motion.  Neurological: He is alert and oriented to person, place, and time.  Skin: Skin is warm and dry.    Review of Systems  Constitutional: Negative for chills, diaphoresis and fever.  HENT: Negative for congestion, rhinorrhea, sneezing and sore throat.   Respiratory: Negative for cough, shortness of breath and wheezing.   Cardiovascular: Negative for chest pain and palpitations.  Gastrointestinal: Negative for diarrhea, nausea and vomiting.  Musculoskeletal: Negative for myalgias.  Skin: Negative for color change.  Neurological: Negative for dizziness and headaches.  Psychiatric/Behavioral: Positive for dysphoric mood ("Improving"). Negative for agitation, behavioral problems, confusion, decreased concentration, hallucinations, self-injury (Hx of by OD), sleep disturbance and suicidal ideas. The patient is not nervous/anxious (Stable) and is not hyperactive.     Blood pressure 126/67, pulse 97, temperature (!) 97.4 F (36.3 C), resp. rate 18, height 5\' 8"  (1.727 m), weight 81.6 kg, SpO2 100 %.Body mass index is 27.37 kg/m.  General Appearance: Disheveled  Eye Contact:  Fair  Speech:  Normal Rate  Volume:  Decreased  Mood: "Improving"  Affect:  Congruent  Thought Process:  Coherent and Descriptions of Associations: Intact  Orientation:  Full (Time, Place, and Person)  Thought Content:  Logical  Suicidal Thoughts:  No  Homicidal Thoughts:  No  Memory:  Immediate;   Fair Recent;   Fair Remote;   Fair  Judgement:  Impaired  Insight:  Lacking  Psychomotor Activity:  Decreased  Concentration:  Concentration: Fair and Attention Span: Fair  Recall:  [employer] of Knowledge:  Fair  Language:  Fair  Akathisia:  Negative  Handed:  Right  AIMS (if indicated):      Assets:  Desire for Improvement Resilience Social Support  ADL's:  Intact  Cognition:  WNL    Sleep:  Number of Hours: 5.75   Treatment Plan Summary: Daily contact with  patient to assess and evaluate symptoms and progress in treatment and Medication management.  -Continue inpatient hospitalization.  -Will continue today 10/13/2019 plan as below except where it is noted.  -Depression   -Continue Prozac 40 mg po qd.  -Anxiety  -Continue atarax 25 mg po q8h prn.             -Continue Lorazepam 1 mg po Q 6 hrs prn for CIWA >10.             -Continue Folic acid 1 mg po for low folate replacement.             -Continue thiamine 100 mg po daily for thiamine deficiency.  -Insomnia  -Continue Trazodone 50 mg po qhs prn.  -Encourage participation in groups and therapeutic milieu  -Disposition planning will be ongoing  Armandina Stammer, NP, PMHNP, FNP-BC 10/13/2019, 2:09 PMPatient ID: Brett Sanford, male   DOB: 01-13-1991, 29 y.o.   MRN: [id number]

## 2019-10-13 NOTE — Progress Notes (Signed)
Adult Psychoeducational Group Note  Date:  10/13/2019 Time:  2:27 AM  Group Topic/Focus:  Wrap-Up Group:   The focus of this group is to help patients review their daily goal of treatment and discuss progress on daily workbooks.  Participation Level:  Active  Participation Quality:  Appropriate  Affect:  Appropriate  Cognitive:  Appropriate  Insight: Appropriate  Engagement in Group:  Developing/Improving  Modes of Intervention:  Discussion  Additional Comments:  Pt stated his goal for today was to focus on his treatment plan and to talk with his doctor about his discharge plan. Pt stated he accomplished his goal today. Pt stated the plan is for him to be discharge on Monday. Pt stated his relationship with his family has improved since he was admitted. Pt stated his mother coming for visitation improve his day. Pt stated he felt better about himself today. Pt rated his overall day an 8 out of 10. Pt stated his appetite was pretty good today. Pt stated his sleep last night was pretty good. Pt stated he was in no physical pain today.  Pt deny auditory or visual hallucinations. Pt denies thoughts of harming himself or others. Pt stated he would alert staff if anything changes.   Brett Sanford 10/13/2019, 2:27 AM

## 2019-10-14 NOTE — Progress Notes (Signed)
Adult Psychoeducational Group Note  Date:  10/14/2019 Time:  9:49 PM  Group Topic/Focus:  Wrap-Up Group:   The focus of this group is to help patients review their daily goal of treatment and discuss progress on daily workbooks.  Participation Level:  Active  Participation Quality:  Appropriate  Affect:  Appropriate  Cognitive:  Appropriate  Insight: Appropriate  Engagement in Group:  Engaged  Modes of Intervention:  Discussion  Additional Comments:  Patient attended group and participated.   Brett Sanford W Travas Schexnayder 10/14/2019, 9:49 PM

## 2019-10-14 NOTE — [facility] Group Notes (Signed)
Type of Therapy/Topic: Identifying Irrational Beliefs/Thoughts  Participation Level: Active  Description of Group: The purpose of this group is to assist patients in learning to identify irrational beliefs and thoughts that contribute to their negative emotions and experience positive emotions. Patients will be guided to discuss ways in which they have been effected by irrational thoughts and beliefs and how to transform those irrational beliefs into rational ones. Newly identified rational beliefs will be juxtaposed with experiences of positive emotions or situations, and patients will be challenged to use rational beliefs or thoughts to combat negative ones. Special emphasis will be placed on coping with irrational beliefs in conflict situations, and patients will process healthy conflict resolution skills.  Therapeutic Goals: 1. Patient will identify two irrational thoughts or beliefs  to reflect on in order to balance out those thoughts 2. Patient will label two or more irrational thoughts/beliefs that they find the most difficult to cope with 3. Patient will demonstrate positive conflict resolution skills through discussion and/or role plays that will assist in transforming irrational thoughts or beliefs into positive ones.  Summary of Patient Progress:  Due to the COVID-19 Pandemic, this group was supplemented with a worksheet and direct patient feedback.   Therapeutic Modalities: Cognitive Behavioral Therapy Feelings Identification Dialectical Behavioral Therapy 

## 2019-10-14 NOTE — Plan of Care (Signed)
Progress note  D: pt found in bed; compliant with medication administration. Pt is anxious and sullen on approach. Pt states they are just worried about where they will go when they leave here. Pt is hopeful to get into a treatment center for alcohol dependency. Pt states they slept well. Pt rates their depression/hopelessness/anxiety a 3/3/3 out of 10 respectively. Pt denies any physical complaints or pain. Pt states their goal for today is to work on their discharge plan. Pt denies si/hi/ah/vh and verbally agrees to approach staff if these become apparent or before harming themself/others while at [facility].  A: Pt provided support and encouragement. Pt given medication per protocol and standing orders. Q67m safety checks implemented and continued.  R: Pt safe on the unit. Will continue to monitor.  Pt progressing in the following metrics  Problem: Education: Goal: Knowledge of [facility] Education information/materials will improve Outcome: Progressing Goal: Emotional status will improve Outcome: Progressing Goal: Mental status will improve Outcome: Progressing Goal: Verbalization of understanding the information provided will improve Outcome: Progressing

## 2019-10-14 NOTE — Progress Notes (Signed)
[facility] MD Progress Note  10/14/2019 10:26 AM Brett Sanford  MRN:  [id number] Subjective:   Brett Sanford presented on 12/27 he was brought in by EMS he was found unresponsive and his blood alcohol level was 272 and according to family, he had threatened suicide.  At this point in time he states he may not be welcome back home and "certain things are arranged" meaning that his mother wants to be sure he is drug-free alcohol free so forth.  He is also interested in some type of rehab.  He asks appropriate questions about his medications basic risk-benefit side effects discussed.  No cravings tremors or withdrawal.  Denies wanting to harm himself now can contract for safety here.  Principal Problem: MDD (major depressive disorder) Diagnosis: Principal Problem:   MDD (major depressive disorder)  Total Time spent with patient: 20 minutes  Past Psychiatric History: Last encounter in the system 6 months ago  Past Medical History:  Past Medical History:  Diagnosis Date  . ADD (attention deficit disorder)   . Drug abuse (HCC)   . Medical history non-contributory     Past Surgical History:  Procedure Laterality Date  . NO PAST SURGERIES     Family History:  Family History  Family history unknown: Yes   Family Psychiatric  History: No new data shared today Social History:  Social History   Substance and Sexual Activity  Alcohol Use Yes   Comment: hx of alcoholism     Social History   Substance and Sexual Activity  Drug Use Yes  . Types: Marijuana    Social History   Socioeconomic History  . Marital status: Single    Spouse name: Not on file  . Number of children: Not on file  . Years of education: Not on file  . Highest education level: Not on file  Occupational History  . Not on file  Tobacco Use  . Smoking status: Current Every Day Smoker  . Smokeless tobacco: Never Used  Substance and Sexual Activity  . Alcohol use: Yes    Comment: hx of alcoholism  . Drug use: Yes   Types: Marijuana  . Sexual activity: Not on file  Other Topics Concern  . Not on file  Social History Narrative  . Not on file   Social Determinants of Health   Financial Resource Strain:   . Difficulty of Paying Living Expenses: Not on file  Food Insecurity:   . Worried About Programme researcher, broadcasting/film/video in the Last Year: Not on file  . Ran Out of Food in the Last Year: Not on file  Transportation Needs:   . Lack of Transportation (Medical): Not on file  . Lack of Transportation (Non-Medical): Not on file  Physical Activity:   . Days of Exercise per Week: Not on file  . Minutes of Exercise per Session: Not on file  Stress:   . Feeling of Stress : Not on file  Social Connections:   . Frequency of Communication with Friends and Family: Not on file  . Frequency of Social Gatherings with Friends and Family: Not on file  . Attends Religious Services: Not on file  . Active Member of Clubs or Organizations: Not on file  . Attends Banker Meetings: Not on file  . Marital Status: Not on file   Additional Social History:                         Sleep: Good  Appetite:  Good  Current Medications: Current Facility-Administered Medications  Medication Dose Route Frequency Provider Last Rate Last Admin  . acetaminophen (TYLENOL) tablet 650 mg  650 mg Oral Q6H PRN Sharma Covert, MD      . alum & mag hydroxide-simeth (MAALOX/MYLANTA) 200-200-20 MG/5ML suspension 30 mL  30 mL Oral Q6H PRN Lindell Spar I, NP   30 mL at 10/13/19 2011  . FLUoxetine (PROZAC) capsule 40 mg  40 mg Oral Daily Mordecai Maes, NP   40 mg at 10/14/19 0748  . folic acid (FOLVITE) tablet 1 mg  1 mg Oral Daily Mordecai Maes, NP   1 mg at 10/14/19 0748  . hydrOXYzine (ATARAX/VISTARIL) tablet 25 mg  25 mg Oral TID PRN Anike, Adaku C, NP   25 mg at 10/14/19 0750  . ibuprofen (ADVIL) tablet 400 mg  400 mg Oral Q6H PRN Sharma Covert, MD   400 mg at 10/11/19 1511  . LORazepam (ATIVAN) tablet 1  mg  1 mg Oral Q6H PRN Sharma Covert, MD      . multivitamin with minerals tablet 1 tablet  1 tablet Oral Daily Mordecai Maes, NP   1 tablet at 10/14/19 0748  . nicotine (NICODERM CQ - dosed in mg/24 hours) patch 14 mg  14 mg Transdermal Daily Mordecai Maes, NP   14 mg at 10/14/19 0750  . thiamine tablet 100 mg  100 mg Oral Daily Mordecai Maes, NP   100 mg at 10/14/19 0748  . traZODone (DESYREL) tablet 50 mg  50 mg Oral QHS PRN Anike, Adaku C, NP   50 mg at 10/13/19 2133    Lab Results: No results found for this or any previous visit (from the past 48 hour(s)).  Blood Alcohol level:  Lab Results  Component Value Date   ETH 272 (H) 10/06/2019   ETH <10 44/12/4740    Metabolic Disorder Labs: Lab Results  Component Value Date   HGBA1C 4.4 (L) 10/11/2019   MPG 79.58 10/11/2019   MPG 85.32 03/28/2019   No results found for: PROLACTIN Lab Results  Component Value Date   CHOL 249 (H) 10/11/2019   TRIG 136 10/11/2019   HDL 107 10/11/2019   CHOLHDL 2.3 10/11/2019   VLDL 27 10/11/2019   LDLCALC 115 (H) 10/11/2019   LDLCALC NOT CALCULATED 03/28/2019    Physical Findings: AIMS: Facial and Oral Movements Muscles of Facial Expression: None, normal Lips and Perioral Area: None, normal Jaw: None, normal Tongue: None, normal,Extremity Movements Upper (arms, wrists, hands, fingers): None, normal Lower (legs, knees, ankles, toes): None, normal, Trunk Movements Neck, shoulders, hips: None, normal, Overall Severity Severity of abnormal movements (highest score from questions above): None, normal Incapacitation due to abnormal movements: None, normal Patient's awareness of abnormal movements (rate only patient's report): No Awareness, Dental Status Current problems with teeth and/or dentures?: No Does patient usually wear dentures?: No  CIWA:  CIWA-Ar Total: 4 COWS:     Musculoskeletal: Strength & Muscle Tone: within normal limits Gait & Station: normal Patient leans:  N/A  Psychiatric Specialty Exam: Physical Exam  Review of Systems  Blood pressure 107/61, pulse 86, temperature 97.6 F (36.4 C), resp. rate 18, height 5\' 8"  (1.727 m), weight 81.6 kg, SpO2 100 %.Body mass index is 27.37 kg/m.  General Appearance: Casual  Eye Contact:  Fair  Speech:  Clear and Coherent  Volume:  Normal  Mood:  Dysphoric  Affect:  Appropriate  Thought Process:  Coherent and Goal Directed  Orientation:  Full (Time, Place, and Person)  Thought Content:  Logical  Suicidal Thoughts:  No  Homicidal Thoughts:  No  Memory:  Immediate;   Fair Recent;   Fair Remote;   Fair  Judgement:  Fair  Insight:  Fair  Psychomotor Activity:  Normal  Concentration:  Concentration: Fair and Attention Span: Fair  Recall:  [employer] of Knowledge:  Fair  Language:  Fair  Akathisia:  Negative  Handed:  Right  AIMS (if indicated):     Assets:  Resilience Social Support  ADL's:  Intact  Cognition:  WNL  Sleep:  Number of Hours: 4.5     Treatment Plan Summary: Daily contact with patient to assess and evaluate symptoms and progress in treatment and Medication management  Continue current antidepressant therapy and cognitive therapy Continue to monitor for cravings but he should not have any withdrawal further Probable discharge tomorrow continue to look for rehab facility  [facility], MD 10/14/2019, 10:26 AM

## 2019-10-14 NOTE — Progress Notes (Signed)
Patient ID: Brett Sanford, male   DOB: 13-Aug-1991, 29 y.o.   MRN: [id number] DAR Note: Pt calm/cooperative, denies SI/HI/AVH, maintained on Q15 minute checks and took all meds as ordered. Pt is currently in bed sleeping, does not seem to be in any apparent distress.

## 2019-10-14 NOTE — [facility] Counselor (Signed)
CSW met with patient to follow up with requests for residential substance use treatment. Patient declined referrals for residential treatment during PSA on 01/01.   Today, patient was initially agreeable to treatment referrals but approached CSW at the nurses station shortly afterward and reported that he wouldn't be able to enter treatment any earlier than 10/27/19, as he states he has things he needs to take care of.   Patient reports he will resume IOP with [facility] (Monday, Wednesday, and Fridays). He is agreeable to CSW providing referral information for residential programs and stated he can have his IOP provider assist him with the referral process.  He verbalized readiness for a discharge as soon as tomorrow 01/05.  Stephanie Acre, MSW, LCSW-A Clinical Social Worker [facility]

## 2019-10-14 NOTE — Progress Notes (Signed)
Recreation Therapy Notes  Date:  1.4.21 Time: 0930 Location: 300 [address] Dayroom  Group Topic: Stress Management  Goal Area(s) Addresses:  Patient will identify positive stress management techniques. Patient will identify benefits of using stress management post d/c.  Intervention: Stress Management  Activity :  Meditation.  LRT played a meditation that focused on being resilient in the face of adversity.  Patients were to listen and follow along as meditation was played.  Education:  Stress Management, Discharge Planning.   Education Outcome: Acknowledges Education  Clinical Observations/Feedback: Pt did not attend group session.    Caroll Rancher, LRT/CTRS         Caroll Rancher A 10/14/2019 10:56 AM

## 2019-10-15 MED ORDER — ACAMPROSATE CALCIUM 333 MG PO TBEC
666.0000 mg | DELAYED_RELEASE_TABLET | Freq: Three times a day (TID) | ORAL | Status: DC
  Administered 2019-10-15 – 2019-10-17 (×7): 666 mg via ORAL
  Filled 2019-10-15 (×13): qty 2

## 2019-10-15 NOTE — Progress Notes (Addendum)
[facility] MD Progress Note  10/15/2019 11:49 AM Brett Sanford  MRN:  [id number] Subjective:  "I don't feel ready to go."  Brett Sanford found sitting in the dayroom. He presents with euthymic affect but reports feeling depressed and anxious related to high cravings for alcohol. He reports the alcohol "levels me out and helps me function." His mood is improving since admission, and he is future-oriented, with plans to return to the [facility] day program before attending [facility] 28 day rehab. He continues to express concerns for cravings and possible relapse. He reports the overdose prior to admission was related to alcohol intake, and that alcohol typically makes him angry and depressed. He denies SI/HI/AVH. He is agreeable to Campral trial.  From admission H&P: Patient is a 29 year old male with a past psychiatric history significant of alcohol dependence who originally presented to the [facility] emergency department on 10/06/2019 secondary to an overdose of Benadryl. Patient arrived in the emergency room with altered mental status. The patient admitted on history and physical to the [facility] medical unit that he had not been compliant with medications and drinks alcohol essentially on every day basis.  Principal Problem: MDD (major depressive disorder) Diagnosis: Principal Problem:   MDD (major depressive disorder)  Total Time spent with patient: 15 minutes  Past Psychiatric History: See admission H&P  Past Medical History:  Past Medical History:  Diagnosis Date  . ADD (attention deficit disorder)   . Drug abuse (HCC)   . Medical history non-contributory     Past Surgical History:  Procedure Laterality Date  . NO PAST SURGERIES     Family History:  Family History  Family history unknown: Yes   Family Psychiatric  History: See admission H&P Social History:  Social History   Substance and Sexual Activity  Alcohol Use Yes   Comment: hx of alcoholism      Social History   Substance and Sexual Activity  Drug Use Yes  . Types: Marijuana    Social History   Socioeconomic History  . Marital status: Single    Spouse name: Not on file  . Number of children: Not on file  . Years of education: Not on file  . Highest education level: Not on file  Occupational History  . Not on file  Tobacco Use  . Smoking status: Current Every Day Smoker  . Smokeless tobacco: Never Used  Substance and Sexual Activity  . Alcohol use: Yes    Comment: hx of alcoholism  . Drug use: Yes    Types: Marijuana  . Sexual activity: Not on file  Other Topics Concern  . Not on file  Social History Narrative  . Not on file   Social Determinants of Health   Financial Resource Strain:   . Difficulty of Paying Living Expenses: Not on file  Food Insecurity:   . Worried About Programme researcher, broadcasting/film/video in the Last Year: Not on file  . Ran Out of Food in the Last Year: Not on file  Transportation Needs:   . Lack of Transportation (Medical): Not on file  . Lack of Transportation (Non-Medical): Not on file  Physical Activity:   . Days of Exercise per Week: Not on file  . Minutes of Exercise per Session: Not on file  Stress:   . Feeling of Stress : Not on file  Social Connections:   . Frequency of Communication with Friends and Family: Not on file  . Frequency of Social Gatherings with  Friends and Family: Not on file  . Attends Religious Services: Not on file  . Active Member of Clubs or Organizations: Not on file  . Attends Archivist Meetings: Not on file  . Marital Status: Not on file   Additional Social History:                         Sleep: Good  Appetite:  Good  Current Medications: Current Facility-Administered Medications  Medication Dose Route Frequency Provider Last Rate Last Admin  . acamprosate (CAMPRAL) tablet 666 mg  666 mg Oral TID WC Connye Burkitt, NP      . acetaminophen (TYLENOL) tablet 650 mg  650 mg Oral Q6H PRN  Sharma Covert, MD      . alum & mag hydroxide-simeth (MAALOX/MYLANTA) 200-200-20 MG/5ML suspension 30 mL  30 mL Oral Q6H PRN Lindell Spar I, NP   30 mL at 10/13/19 2011  . FLUoxetine (PROZAC) capsule 40 mg  40 mg Oral Daily Mordecai Maes, NP   40 mg at 10/15/19 7628  . folic acid (FOLVITE) tablet 1 mg  1 mg Oral Daily Mordecai Maes, NP   1 mg at 10/15/19 3151  . hydrOXYzine (ATARAX/VISTARIL) tablet 25 mg  25 mg Oral TID PRN Anike, Adaku C, NP   25 mg at 10/15/19 0946  . ibuprofen (ADVIL) tablet 400 mg  400 mg Oral Q6H PRN Sharma Covert, MD   400 mg at 10/11/19 1511  . LORazepam (ATIVAN) tablet 1 mg  1 mg Oral Q6H PRN Sharma Covert, MD      . multivitamin with minerals tablet 1 tablet  1 tablet Oral Daily Mordecai Maes, NP   1 tablet at 10/15/19 [id number]  . nicotine (NICODERM CQ - dosed in mg/24 hours) patch 14 mg  14 mg Transdermal Daily Mordecai Maes, NP   14 mg at 10/15/19 0737  . thiamine tablet 100 mg  100 mg Oral Daily Mordecai Maes, NP   100 mg at 10/15/19 1062  . traZODone (DESYREL) tablet 50 mg  50 mg Oral QHS PRN Anike, Adaku C, NP   50 mg at 10/14/19 2119    Lab Results: No results found for this or any previous visit (from the past 48 hour(s)).  Blood Alcohol level:  Lab Results  Component Value Date   ETH 272 (H) 10/06/2019   ETH <10 69/48/5462    Metabolic Disorder Labs: Lab Results  Component Value Date   HGBA1C 4.4 (L) 10/11/2019   MPG 79.58 10/11/2019   MPG 85.32 03/28/2019   No results found for: PROLACTIN Lab Results  Component Value Date   CHOL 249 (H) 10/11/2019   TRIG 136 10/11/2019   HDL 107 10/11/2019   CHOLHDL 2.3 10/11/2019   VLDL 27 10/11/2019   LDLCALC 115 (H) 10/11/2019   LDLCALC NOT CALCULATED 03/28/2019    Physical Findings: AIMS: Facial and Oral Movements Muscles of Facial Expression: None, normal Lips and Perioral Area: None, normal Jaw: None, normal Tongue: None, normal,Extremity Movements Upper (arms, wrists,  hands, fingers): None, normal Lower (legs, knees, ankles, toes): None, normal, Trunk Movements Neck, shoulders, hips: None, normal, Overall Severity Severity of abnormal movements (highest score from questions above): None, normal Incapacitation due to abnormal movements: None, normal Patient's awareness of abnormal movements (rate only patient's report): No Awareness, Dental Status Current problems with teeth and/or dentures?: No Does patient usually wear dentures?: No  CIWA:  CIWA-Ar Total: 4 COWS:  Musculoskeletal: Strength & Muscle Tone: within normal limits Gait & Station: normal Patient leans: N/A  Psychiatric Specialty Exam: Physical Exam  Nursing note and vitals reviewed. Constitutional: He is oriented to person, place, and time. He appears well-developed and well-nourished.  Cardiovascular: Normal rate.  Respiratory: Effort normal.  Neurological: He is alert and oriented to person, place, and time.    Review of Systems  Constitutional: Negative.   Respiratory: Negative for cough and shortness of breath.   Psychiatric/Behavioral: Positive for dysphoric mood. Negative for agitation, behavioral problems, hallucinations, self-injury, sleep disturbance and suicidal ideas. The patient is nervous/anxious.     Blood pressure 107/61, pulse 86, temperature 97.6 F (36.4 C), resp. rate 18, height 5\' 8"  (1.727 m), weight 81.6 kg, SpO2 100 %.Body mass index is 27.37 kg/m.  General Appearance: Casual  Eye Contact:  Good  Speech:  Normal Rate  Volume:  Normal  Mood:  Anxious and Depressed  Affect:  Euthymic  Thought Process:  Coherent and Goal Directed  Orientation:  Full (Time, Place, and Person)  Thought Content:  Logical  Suicidal Thoughts:  No  Homicidal Thoughts:  No  Memory:  Immediate;   Fair Recent;   Fair  Judgement:  Intact  Insight:  Fair  Psychomotor Activity:  Normal  Concentration:  Concentration: Good and Attention Span: Good  Recall:  of  Knowledge:  Fair  Language:  Good  Akathisia:  No  Handed:  Right  AIMS (if indicated):     Assets:  Communication Skills Desire for Improvement Financial Resources/Insurance Housing Resilience  ADL's:  Intact  Cognition:  WNL  Sleep:  Number of Hours: 6.25     Treatment Plan Summary: Daily contact with patient to assess and evaluate symptoms and progress in treatment and Medication management   Continue inpatient hospitalization.  Start Campral 666 mg PO TID for alcohol cravings Continue Prozac 40 mg PO daily for anxiety/depression Continue thiamine 100 mg PO daily for supplementation Continue folic acid 1 mg PO daily for supplementation Continue Vistaril 25 mg PO TID PRN anxiety Continue trazodone 50 mg PO QHS PRN insomnia  Patient will participate in the therapeutic group milieu.  Discharge disposition in progress.   [employer], NP 10/15/2019, 11:49 AM   Agree with NP Note

## 2019-10-15 NOTE — Progress Notes (Signed)
Adult Psychoeducational Group Note  Date:  10/15/2019 Time:  9:09 PM  Group Topic/Focus:  Wrap-Up Group:   The focus of this group is to help patients review their daily goal of treatment and discuss progress on daily workbooks.  Participation Level:  Active  Participation Quality:  Appropriate  Affect:  Appropriate  Cognitive:  Appropriate  Insight: Appropriate  Engagement in Group:  Engaged  Modes of Intervention:  Discussion  Additional Comments:  Patient attended group and participated  Brett Sanford W Ferrin Liebig 10/15/2019, 9:09 PM

## 2019-10-15 NOTE — Progress Notes (Signed)
    10/15/19 1000  Psych Admission Type (Psych Patients Only)  Admission Status Involuntary  Psychosocial Assessment  Patient Complaints Anxiety  Eye Contact Fair  Facial Expression Animated;Anxious  Affect Appropriate to circumstance  Speech Logical/coherent  Interaction Assertive  Motor Activity Slow  Appearance/Hygiene Unremarkable  Behavior Characteristics Cooperative  Mood Anxious  Aggressive Behavior  Effect No apparent injury  Thought Process  Coherency WDL  Content WDL  Delusions None reported or observed  Perception WDL  Hallucination None reported or observed  Judgment WDL  Confusion None  Danger to Self  Current suicidal ideation? Denies  Danger to Others  Danger to Others None reported or observed

## 2019-10-15 NOTE — Progress Notes (Signed)
   10/14/19 2115  Psych Admission Type (Psych Patients Only)  Admission Status Involuntary  Psychosocial Assessment  Patient Complaints None  Eye Contact Fair  Facial Expression Animated;Anxious  Affect Appropriate to circumstance  Speech Logical/coherent  Interaction Assertive  Motor Activity Slow  Appearance/Hygiene Unremarkable  Behavior Characteristics Cooperative;Appropriate to situation  Mood Pleasant  Aggressive Behavior  Effect No apparent injury  Thought Process  Coherency WDL  Content WDL  Delusions None reported or observed  Perception WDL  Hallucination None reported or observed  Judgment WDL  Confusion None  Danger to Self  Current suicidal ideation? Denies  Danger to Others  Danger to Others None reported or observed   Pt pleasant and cooperative. Pt denies SI, HI, AVH and pain. Pt endorses vivid dreams last night that interrupted his sleep. Pt given trazodone 50 mg for sleep. Pt contracts for safety.

## 2019-10-15 NOTE — Progress Notes (Signed)
Recreation Therapy Notes  Animal-Assisted Activity (AAA) Program Checklist/Progress Notes Patient Eligibility Criteria Checklist & Daily Group note for Rec Tx Intervention  Date: 1.5.21 Time: 1430 Location: 300 Hall Dayroom   AAA/T Program Assumption of Risk Form signed by Patient/ or Parent Legal Guardian  YES   Patient is free of allergies or sever asthma  YES   Patient reports no fear of animals  YES   Patient reports no history of cruelty to animals  YES   Patient understands his/her participation is voluntary  YES   Patient washes hands before animal contact  YES   Patient washes hands after animal contact  YES   Behavioral Response: Engaged  Education: Hand Washing, Appropriate Animal Interaction   Education Outcome: Acknowledges understanding/In group clarification offered/Needs additional education.   Clinical Observations/Feedback:  Pt attended and participated in activity.    Brett Sanford, LRT/CTRS         Brett Sanford 10/15/2019 3:38 PM 

## 2019-10-16 LAB — T4, FREE: Free T4: 0.74 ng/dL (ref 0.61–1.12)

## 2019-10-16 NOTE — Progress Notes (Addendum)
D:  Patient's self inventory sheet, patient sleeps good, sleep medication helpful.  Good appetite, normal energy level, good concentration.  Rated depression, hopeless and anxiety 3.  Denied withdrawals.  Denied SI.  Denied physical problems.  Denied physical pain.  Goal is discharge.  Plans to talk to MD.  Does have discharge plans. A:  Medications administered per MD orders.  Emotional support and encouragement given patient. R:  Denied SI and HI, contracts for safety.  Denied A/V hallucinations.  Safety maintained with 15 minute checks.  Patient stated he will be discharged tomorrow 10/17/2019.

## 2019-10-16 NOTE — Progress Notes (Signed)
Recreation Therapy Notes  Date:  1.6.21 Time: 0930 Location: 300 [address] Dayroom  Group Topic: Stress Management  Goal Area(s) Addresses:  Patient will identify positive stress management techniques. Patient will identify benefits of using stress management post d/c.  Behavioral Response:  Engaged  Intervention: Stress Management  Activity :  Guided Imagery.  LRT read Sanford script that encouraged patients to enjoy the sounds of waves while relaxing on the beach. Patients were to listen and follow along as meditation.  Education:  Stress Management, Discharge Planning.   Education Outcome: Acknowledges Education  Clinical Observations/Feedback: Pt attended and participated in activity.    [name], LRT/CTRS         Brett Sanford 10/16/2019 11:04 AM 

## 2019-10-16 NOTE — Tx Team (Signed)
Interdisciplinary Treatment and Diagnostic Plan Update  10/16/2019 Time of Session: 10:00am Brett Sanford MRN: [id number]  Principal Diagnosis: MDD (major depressive disorder)  Secondary Diagnoses: Principal Problem:   MDD (major depressive disorder)   Current Medications:  Current Facility-Administered Medications  Medication Dose Route Frequency Provider Last Rate Last Admin  . acamprosate (CAMPRAL) tablet 666 mg  666 mg Oral TID WC Connye Burkitt, NP   666 mg at 10/16/19 2355  . acetaminophen (TYLENOL) tablet 650 mg  650 mg Oral Q6H PRN Sharma Covert, MD      . alum & mag hydroxide-simeth (MAALOX/MYLANTA) 200-200-20 MG/5ML suspension 30 mL  30 mL Oral Q6H PRN Lindell Spar I, NP   30 mL at 10/13/19 2011  . FLUoxetine (PROZAC) capsule 40 mg  40 mg Oral Daily Mordecai Maes, NP   40 mg at 10/16/19 0806  . folic acid (FOLVITE) tablet 1 mg  1 mg Oral Daily Mordecai Maes, NP   1 mg at 10/16/19 0806  . hydrOXYzine (ATARAX/VISTARIL) tablet 25 mg  25 mg Oral TID PRN Anike, Adaku C, NP   25 mg at 10/16/19 0809  . ibuprofen (ADVIL) tablet 400 mg  400 mg Oral Q6H PRN Sharma Covert, MD   400 mg at 10/11/19 1511  . multivitamin with minerals tablet 1 tablet  1 tablet Oral Daily Mordecai Maes, NP   1 tablet at 10/16/19 0809  . nicotine (NICODERM CQ - dosed in mg/24 hours) patch 14 mg  14 mg Transdermal Daily Mordecai Maes, NP   14 mg at 10/16/19 0807  . thiamine tablet 100 mg  100 mg Oral Daily Mordecai Maes, NP   100 mg at 10/16/19 0806  . traZODone (DESYREL) tablet 50 mg  50 mg Oral QHS PRN Anike, Adaku C, NP   50 mg at 10/15/19 2132   PTA Medications: Medications Prior to Admission  Medication Sig Dispense Refill Last Dose  . FLUoxetine (PROZAC) 40 MG capsule Take 40 mg by mouth daily.      . folic acid (FOLVITE) 1 MG tablet Take 1 tablet (1 mg total) by mouth daily. 30 tablet 2   . nicotine (NICODERM CQ - DOSED IN MG/24 HOURS) 14 mg/24hr patch Place 1 patch (14 mg  total) onto the skin daily. 28 patch 0   . thiamine 100 MG tablet Take 1 tablet (100 mg total) by mouth daily. 30 tablet 2     Patient Stressors: Health problems Occupational concerns Substance abuse  Patient Strengths: Ability for insight Capable of independent living Communication skills Supportive family/friends  Treatment Modalities: Medication Management, Group therapy, Case management,  1 to 1 session with clinician, Psychoeducation, Recreational therapy.   Physician Treatment Plan for Primary Diagnosis: MDD (major depressive disorder) Long Term Goal(s): Improvement in symptoms so as ready for discharge Improvement in symptoms so as ready for discharge   Short Term Goals: Ability to identify changes in lifestyle to reduce recurrence of condition will improve Ability to verbalize feelings will improve Ability to disclose and discuss suicidal ideas Ability to demonstrate self-control will improve Ability to identify and develop effective coping behaviors will improve Ability to maintain clinical measurements within normal limits will improve Compliance with prescribed medications will improve Ability to identify triggers associated with substance abuse/mental health issues will improve Ability to identify changes in lifestyle to reduce recurrence of condition will improve Ability to verbalize feelings will improve Ability to disclose and discuss suicidal ideas Ability to demonstrate self-control will improve Ability to  identify and develop effective coping behaviors will improve Ability to maintain clinical measurements within normal limits will improve Compliance with prescribed medications will improve Ability to identify triggers associated with substance abuse/mental health issues will improve  Medication Management: Evaluate patient's response, side effects, and tolerance of medication regimen.  Therapeutic Interventions: 1 to 1 sessions, Unit Group sessions and  Medication administration.  Evaluation of Outcomes: Progressing  Physician Treatment Plan for Secondary Diagnosis: Principal Problem:   MDD (major depressive disorder)  Long Term Goal(s): Improvement in symptoms so as ready for discharge Improvement in symptoms so as ready for discharge   Short Term Goals: Ability to identify changes in lifestyle to reduce recurrence of condition will improve Ability to verbalize feelings will improve Ability to disclose and discuss suicidal ideas Ability to demonstrate self-control will improve Ability to identify and develop effective coping behaviors will improve Ability to maintain clinical measurements within normal limits will improve Compliance with prescribed medications will improve Ability to identify triggers associated with substance abuse/mental health issues will improve Ability to identify changes in lifestyle to reduce recurrence of condition will improve Ability to verbalize feelings will improve Ability to disclose and discuss suicidal ideas Ability to demonstrate self-control will improve Ability to identify and develop effective coping behaviors will improve Ability to maintain clinical measurements within normal limits will improve Compliance with prescribed medications will improve Ability to identify triggers associated with substance abuse/mental health issues will improve     Medication Management: Evaluate patient's response, side effects, and tolerance of medication regimen.  Therapeutic Interventions: 1 to 1 sessions, Unit Group sessions and Medication administration.  Evaluation of Outcomes: Progressing   RN Treatment Plan for Primary Diagnosis: MDD (major depressive disorder) Long Term Goal(s): Knowledge of disease and therapeutic regimen to maintain health will improve  Short Term Goals: Ability to participate in decision making will improve, Ability to verbalize feelings will improve, Ability to disclose and  discuss suicidal ideas and Ability to identify and develop effective coping behaviors will improve  Medication Management: RN will administer medications as ordered by provider, will assess and evaluate patient's response and provide education to patient for prescribed medication. RN will report any adverse and/or side effects to prescribing provider.  Therapeutic Interventions: 1 on 1 counseling sessions, Psychoeducation, Medication administration, Evaluate responses to treatment, Monitor vital signs and CBGs as ordered, Perform/monitor CIWA, COWS, AIMS and Fall Risk screenings as ordered, Perform wound care treatments as ordered.  Evaluation of Outcomes: Progressing   LCSW Treatment Plan for Primary Diagnosis: MDD (major depressive disorder) Long Term Goal(s): Safe transition to appropriate next level of care at discharge, Engage patient in therapeutic group addressing interpersonal concerns.  Short Term Goals: Engage patient in aftercare planning with referrals and resources, Increase social support, Increase emotional regulation, Identify triggers associated with mental health/substance abuse issues and Increase skills for wellness and recovery  Therapeutic Interventions: Assess for all discharge needs, 1 to 1 time with Social worker, Explore available resources and support systems, Assess for adequacy in community support network, Educate family and significant other(s) on suicide prevention, Complete Psychosocial Assessment, Interpersonal group therapy.  Evaluation of Outcomes: Progressing   Progress in Treatment: Attending groups: Yes. Participating in groups: Yes. Taking medication as prescribed: Yes. Toleration medication: Yes. Family/Significant other contact made: No, will contact:  declined consents, SPE reviewed with patient. Patient understands diagnosis: Yes. Discussing patient identified problems/goals with staff: Yes. Medical problems stabilized or resolved: Yes. Denies  suicidal/homicidal ideation: Yes. Issues/concerns per patient self-inventory: No. Other:  New problem(s) identified: None   New Short Term/Long Term Goal(s): Detox, medication stabilization, elimination of SI thoughts, development of comprehensive mental wellness plan.    Patient Goals:    Discharge Plan or Barriers: Declined residential treatment referrals. Expected to return home with family and resume IOP and medication management through [facility].   Reason for Continuation of Hospitalization: Depression Medication stabilization Suicidal ideation Other; describe Substance use/abuse  Estimated Length of Stay: 1 day   Attendees: Patient: 10/16/2019 10:36 AM  Physician: Dr. Landry Mellow, MD Dr.Cobos 10/16/2019 10:36 AM  Nursing: Rodell Perna.W, RN [place], RN 10/16/2019 10:36 AM  RN Care Manager: 10/16/2019 10:36 AM  Social Worker: Baldo Daub, LCSW  [place], [place] 10/16/2019 10:36 AM  Recreational Therapist:  10/16/2019 10:36 AM  Other:  10/16/2019 10:36 AM  Other:  10/16/2019 10:36 AM  Other: 10/16/2019 10:36 AM    Scribe for Treatment Team: Darreld Mclean, LCSWA 10/16/2019 10:36 AM

## 2019-10-16 NOTE — Progress Notes (Addendum)
[facility] MD Progress Note  10/16/2019 10:08 AM Brett Sanford  MRN:  [id number] Subjective:  Patient reports partial improvement compared to admission, but reports he continues to experience some depression. Denies suicidal ideations. He presents future oriented and is hoping to return to IOP program after discharge and eventually to a residential rehab setting once he completes IOP. Denies medication side effects. Tolerating Campral well thus far . Objective : I have discussed case with treatment team and have met with patient.  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 Denies SI and does present future oriented and focusing on disposition planning options as above. Denies medication side effects.  Behavior on unit in good control, pleasant on approach. Visible in day room, attending groups.   Principal Problem: MDD (major depressive disorder) Diagnosis: Principal Problem:   MDD (major depressive disorder)  Total Time spent with patient: 15 minutes  Past Psychiatric History: See admission H&P  Past Medical History:  Past Medical History:  Diagnosis Date  . ADD (attention deficit disorder)   . Drug abuse ([place])   . Medical history non-contributory     Past Surgical History:  Procedure Laterality Date  . NO PAST SURGERIES     Family History:  Family History  Family history unknown: Yes   Family Psychiatric  History: See admission H&P Social History:  Social History   Substance and Sexual Activity  Alcohol Use Yes   Comment: hx of alcoholism     Social History   Substance and Sexual Activity  Drug Use Yes  . Types: Marijuana    Social History   Socioeconomic History  . Marital status: Single    Spouse name: Not on file  . Number of children:  Not on file  . Years of education: Not on file  . Highest education level: Not on file  Occupational History  . Not on file  Tobacco Use  . Smoking status: Current Every Day Smoker  . Smokeless tobacco: Never Used  Substance and Sexual Activity  . Alcohol use: Yes    Comment: hx of alcoholism  . Drug use: Yes    Types: Marijuana  . Sexual activity: Not on file  Other Topics Concern  . Not on file  Social History Narrative  . Not on file   Social Determinants of Health   Financial Resource Strain:   . Difficulty of Paying Living Expenses: Not on file  Food Insecurity:   . Worried About Charity fundraiser in the Last Year: Not on file  . Ran Out of Food in the Last Year: Not on file  Transportation Needs:   . Lack of Transportation (Medical): Not on file  . Lack of Transportation (Non-Medical): Not on file  Physical Activity:   . Days of Exercise per Week: Not on file  . Minutes of Exercise per Session: Not on file  Stress:   . Feeling of Stress : Not on file  Social Connections:   . Frequency of Communication with Friends and Family: Not on file  . Frequency of Social Gatherings with Friends and Family: Not on file  . Attends Religious Services: Not on file  . Active Member of Clubs or Organizations: Not on file  . Attends Archivist Meetings: Not on file  . Marital  Status: Not on file   Additional Social History:   Sleep: Good  Appetite:  Good  Current Medications: Current Facility-Administered Medications  Medication Dose Route Frequency Provider Last Rate Last Admin  . acamprosate (CAMPRAL) tablet 666 mg  666 mg Oral TID WC Connye Burkitt, NP   666 mg at 10/16/19 0938  . acetaminophen (TYLENOL) tablet 650 mg  650 mg Oral Q6H PRN Sharma Covert, MD      . alum & mag hydroxide-simeth (MAALOX/MYLANTA) 200-200-20 MG/5ML suspension 30 mL  30 mL Oral Q6H PRN Lindell Spar I, NP   30 mL at 10/13/19 2011  . FLUoxetine (PROZAC) capsule 40 mg  40 mg Oral  Daily Mordecai Maes, NP   40 mg at 10/16/19 0806  . folic acid (FOLVITE) tablet 1 mg  1 mg Oral Daily Mordecai Maes, NP   1 mg at 10/16/19 0806  . hydrOXYzine (ATARAX/VISTARIL) tablet 25 mg  25 mg Oral TID PRN Anike, Adaku C, NP   25 mg at 10/16/19 0809  . ibuprofen (ADVIL) tablet 400 mg  400 mg Oral Q6H PRN Sharma Covert, MD   400 mg at 10/11/19 1511  . multivitamin with minerals tablet 1 tablet  1 tablet Oral Daily Mordecai Maes, NP   1 tablet at 10/16/19 0809  . nicotine (NICODERM CQ - dosed in mg/24 hours) patch 14 mg  14 mg Transdermal Daily Mordecai Maes, NP   14 mg at 10/16/19 0807  . thiamine tablet 100 mg  100 mg Oral Daily Mordecai Maes, NP   100 mg at 10/16/19 0806  . traZODone (DESYREL) tablet 50 mg  50 mg Oral QHS PRN Anike, Adaku C, NP   50 mg at 10/15/19 2132    Lab Results:  Results for orders placed or performed during the hospital encounter of 10/10/19 (from the past 48 hour(s))  T4, free     Status: None   Collection Time: 10/16/19  6:25 AM  Result Value Ref Range   Free T4 0.74 0.61 - 1.12 ng/dL    Comment: (NOTE) Biotin ingestion may interfere with free T4 tests. If the results are inconsistent with the TSH level, previous test results, or the clinical presentation, then consider biotin interference. If needed, order repeat testing after stopping biotin. Performed at [facility], [place] [address], [place], [place] [address]     Blood Alcohol level:  Lab Results  Component Value Date   ETH 272 (H) 10/06/2019   ETH <10 37/16/9678    Metabolic Disorder Labs: Lab Results  Component Value Date   HGBA1C 4.4 (L) 10/11/2019   MPG 79.58 10/11/2019   MPG 85.32 03/28/2019   No results found for: PROLACTIN Lab Results  Component Value Date   CHOL 249 (H) 10/11/2019   TRIG 136 10/11/2019   HDL 107 10/11/2019   CHOLHDL 2.3 10/11/2019   VLDL 27 10/11/2019   LDLCALC 115 (H) 10/11/2019   LDLCALC NOT CALCULATED 03/28/2019    Physical  Findings: AIMS: Facial and Oral Movements Muscles of Facial Expression: None, normal Lips and Perioral Area: None, normal Jaw: None, normal Tongue: None, normal,Extremity Movements Upper (arms, wrists, hands, fingers): None, normal Lower (legs, knees, ankles, toes): None, normal, Trunk Movements Neck, shoulders, hips: None, normal, Overall Severity Severity of abnormal movements (highest score from questions above): None, normal Incapacitation due to abnormal movements: None, normal Patient's awareness of abnormal movements (rate only patient's report): No Awareness, Dental Status Current problems with teeth and/or dentures?: No  Does patient usually wear dentures?: No  CIWA:  CIWA-Ar Total: 4 COWS:     Musculoskeletal: Strength & Muscle Tone: within normal limits Gait & Station: normal Patient leans: N/A  Psychiatric Specialty Exam: Physical Exam  Nursing note and vitals reviewed. Constitutional: He is oriented to person, place, and time. He appears well-developed and well-nourished.  Cardiovascular: Normal rate.  Respiratory: Effort normal.  Neurological: He is alert and oriented to person, place, and time.    Review of Systems  Constitutional: Negative.   Respiratory: Negative for cough and shortness of breath.   Psychiatric/Behavioral: Positive for dysphoric mood. Negative for agitation, behavioral problems, hallucinations, self-injury, sleep disturbance and suicidal ideas. The patient is nervous/anxious.   no chest pain, no shortness of breath, no vomiting   Blood pressure 113/75, pulse 83, temperature (!) 97.3 F (36.3 C), temperature source Oral, resp. rate 18, height '5\' 8"'  (1.727 m), weight 81.6 kg, SpO2 100 %.Body mass index is 27.37 kg/m.  General Appearance: improving grooming   Eye Contact:  Good  Speech:  Normal Rate  Volume:  Normal  Mood:  partially improved but describes some lingering depression  Affect:  vaguely anxious , reactive, smiles at times  appropriately during session  Thought Process:  Linear and Descriptions of Associations: Intact  Orientation:  Full (Time, Place, and Person)  Thought Content:  no hallucinations, no delusions, not internally preoccupied   Suicidal Thoughts:  No denies suicidal or self injurious ideations  Homicidal Thoughts:  No  Memory:  recent and remote grossly intact   Judgement:  Other:  improving  Insight:  improving   Psychomotor Activity:  Normal- no tremors, no restlessness, no psychomotor agitation   Concentration:  Concentration: Good and Attention Span: Good  Recall:  Good  Fund of Knowledge:  Good  Language:  Good  Akathisia:  No  Handed:  Right  AIMS (if indicated):     Assets:  Communication Skills Desire for Improvement Financial Resources/Insurance Housing Resilience  ADL's:  Intact  Cognition:  WNL  Sleep:  Number of Hours: 5.75   Assessment -  43 y old male , history of alcohol use disorder, 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  Patient is currently reporting partially improved mood but remains vaguely depressed and apprehensive. Denies suicidal ideations and is future oriented, planning on attending IOP and then going to a rehab after discharge. Tolerating Prozac and Campral well thus far . Denies side effects. No symptoms of alcohol WDL at this time.   Treatment Plan Summary: Daily contact with patient to assess and evaluate symptoms and progress in treatment and Medication management  Treatment Plan reviewed as below today 1/6 Encourage efforts to work on sobriety and relapse prevention Encourage group and milieu participation Treatment team working on disposition planning options Continue Campral 666 mg PO TID for alcohol cravings Continue Prozac 40 mg PO daily for anxiety/depression Continue Vistaril 25 mg PO TID PRN anxiety Continue Trazodone 50 mg PO QHS PRN insomnia T3/T4 ordered to  follow up on mildly elevated TSH- results pending   Jenne Campus, MD 10/16/2019, 10:08 AM   Patient ID: Mertie Moores, male   DOB: 13-Feb-1991, 29 y.o.   MRN: [id number]

## 2019-10-16 NOTE — Progress Notes (Signed)
   10/15/19 2100  Psych Admission Type (Psych Patients Only)  Admission Status Involuntary  Psychosocial Assessment  Patient Complaints Anxiety  Eye Contact Fair  Facial Expression Animated;Anxious  Affect Euphoric  Speech Logical/coherent  Interaction Assertive  Motor Activity Other (Comment) (WNL)  Appearance/Hygiene Unremarkable  Behavior Characteristics Cooperative  Mood Pleasant;Anxious  Aggressive Behavior  Effect No apparent injury  Thought Process  Coherency WDL  Content WDL  Delusions None reported or observed  Perception WDL  Hallucination None reported or observed  Judgment WDL  Confusion None  Danger to Self  Current suicidal ideation? Denies  Danger to Others  Danger to Others None reported or observed   Pt animated but anxious. Says he will be discharged tomorrow. Anxious about it d/t the fact that he will have to deal with the same issues as before. Expressed plan to go to [facility] for a program and might stay there because he has a job lined up. Pt given encouragement about his plans for discharge. Pt denies SI, HI, AVH and pain. Contracts for safety.

## 2019-10-16 NOTE — [facility] Group Notes (Signed)
LCSW Group Therapy Note 10/16/2019 2:32 PM  Type of Therapy and Topic: Group Therapy: Overcoming Obstacles  Participation Level: Active  Description of Group:  In this group patients will be encouraged to explore what they see as obstacles to their own wellness and recovery. They will be guided to discuss their thoughts, feelings, and behaviors related to these obstacles. The group will process together ways to cope with barriers, with attention given to specific choices patients can make. Each patient will be challenged to identify changes they are motivated to make in order to overcome their obstacles. This group will be process-oriented, with patients participating in exploration of their own experiences as well as giving and receiving support and challenge from other group members.  Therapeutic Goals: 1. Patient will identify personal and current obstacles as they relate to admission. 2. Patient will identify barriers that currently interfere with their wellness or overcoming obstacles.  3. Patient will identify feelings, thought process and behaviors related to these barriers. 4. Patient will identify two changes they are willing to make to overcome these obstacles:   Summary of Patient Progress  Brett Sanford was engaged and participated throughout the group session. Brett Sanford reports his only obstacle is his ongoing depressive symptoms. He reports "I feel better, but I'm still a little depressed"    Therapeutic Modalities:  Cognitive Behavioral Therapy Solution Focused Therapy Motivational Interviewing Relapse Prevention Therapy   Brett Sanford Clinical Social Worker

## 2019-10-17 LAB — T3, FREE: T3, Free: 3 pg/mL (ref 2.0–4.4)

## 2019-10-17 MED ORDER — NICOTINE 14 MG/24HR TD PT24: 14.0000 mg | MEDICATED_PATCH | Freq: Every day | TRANSDERMAL | 0 refills | Status: DC

## 2019-10-17 MED ORDER — FOLIC ACID 1 MG PO TABS: 1.0000 mg | ORAL_TABLET | Freq: Every day | ORAL | 0 refills | Status: DC

## 2019-10-17 MED ORDER — ACAMPROSATE CALCIUM 333 MG PO TBEC: 666.0000 mg | DELAYED_RELEASE_TABLET | Freq: Three times a day (TID) | ORAL | 0 refills | Status: DC

## 2019-10-17 MED ORDER — IBUPROFEN 400 MG PO TABS: 400.0000 mg | ORAL_TABLET | Freq: Four times a day (QID) | ORAL | 0 refills | Status: DC | PRN

## 2019-10-17 MED ORDER — TRAZODONE HCL 50 MG PO TABS: 50.0000 mg | ORAL_TABLET | Freq: Every evening | ORAL | 0 refills | Status: DC | PRN

## 2019-10-17 MED ORDER — FLUOXETINE HCL 40 MG PO CAPS: 40.0000 mg | ORAL_CAPSULE | Freq: Every day | ORAL | 0 refills | Status: DC

## 2019-10-17 MED ORDER — HYDROXYZINE HCL 25 MG PO TABS: 25.0000 mg | ORAL_TABLET | Freq: Three times a day (TID) | ORAL | 0 refills | Status: DC | PRN

## 2019-10-17 NOTE — Progress Notes (Signed)
   10/16/19 2326  Psych Admission Type (Psych Patients Only)  Admission Status Involuntary  Psychosocial Assessment  Patient Complaints None  Eye Contact Fair  Facial Expression Animated;Anxious  Affect Euphoric  Speech Logical/coherent  Interaction Assertive  Motor Activity Other (Comment) (WNL)  Appearance/Hygiene Unremarkable  Behavior Characteristics Cooperative;Calm  Mood Pleasant  Aggressive Behavior  Effect No apparent injury  Thought Process  Coherency WDL  Content WDL  Delusions None reported or observed  Perception WDL  Hallucination None reported or observed  Judgment WDL  Confusion None  Danger to Self  Current suicidal ideation? Denies  Danger to Others  Danger to Others None reported or observed

## 2019-10-17 NOTE — Discharge Summary (Addendum)
Physician Discharge Summary Note  Patient:  Brett Sanford is an 29 y.o., male  MRN:  [id number]  DOB:  1991/02/17  Patient phone:  [phone] (home)   Patient address:   [address] [place] [address],   Total Time spent with patient: Greater than 30 minutes  Date of Admission:  10/10/2019  Date of Discharge: 10-17-19  Reason for Admission: Suicide attempt by overdose on Benadryl.  Principal Problem: MDD (major depressive disorder)  Discharge Diagnoses: Principal Problem:   MDD (major depressive disorder)  Past Psychiatric History: Major depressive disorder.  Past Medical History:  Past Medical History:  Diagnosis Date  . ADD (attention deficit disorder)   . Drug abuse (HCC)   . Medical history non-contributory     Past Surgical History:  Procedure Laterality Date  . NO PAST SURGERIES     Family History:  Family History  Family history unknown: Yes   Family Psychiatric  History: See H&P  Social History:  Social History   Substance and Sexual Activity  Alcohol Use Yes   Comment: hx of alcoholism     Social History   Substance and Sexual Activity  Drug Use Yes  . Types: Marijuana    Social History   Socioeconomic History  . Marital status: Single    Spouse name: Not on file  . Number of children: Not on file  . Years of education: Not on file  . Highest education level: Not on file  Occupational History  . Not on file  Tobacco Use  . Smoking status: Current Every Day Smoker  . Smokeless tobacco: Never Used  Substance and Sexual Activity  . Alcohol use: Yes    Comment: hx of alcoholism  . Drug use: Yes    Types: Marijuana  . Sexual activity: Not on file  Other Topics Concern  . Not on file  Social History Narrative  . Not on file   Social Determinants of Health   Financial Resource Strain:   . Difficulty of Paying Living Expenses: Not on file  Food Insecurity:   . Worried About Programme researcher, broadcasting/film/video in the Last Year: Not on  file  . Ran Out of Food in the Last Year: Not on file  Transportation Needs:   . Lack of Transportation (Medical): Not on file  . Lack of Transportation (Non-Medical): Not on file  Physical Activity:   . Days of Exercise per Week: Not on file  . Minutes of Exercise per Session: Not on file  Stress:   . Feeling of Stress : Not on file  Social Connections:   . Frequency of Communication with Friends and Family: Not on file  . Frequency of Social Gatherings with Friends and Family: Not on file  . Attends Religious Services: Not on file  . Active Member of Clubs or Organizations: Not on file  . Attends Banker Meetings: Not on file  . Marital Status: Not on file   Hospital Course: (Per Md's admission SRA Notes): Patient is a 29 year old male with a past psychiatric history significant of alcohol dependence who originally presented to the [facility] emergency department on 10/06/2019 secondary to an overdose of Benadryl. Patient arrived in the emergency room with altered mental status. He was found by his mother, and transported to the emergency department. He was admitted to the medicine side of the hospital. The patient admitted on history and physical to the [facility] medical unit that he had not  been compliant with medications and drinks alcohol essentially on every day basis. He was initially somewhat delirious on admission but this cleared during the course of the hospitalization. He was noted to be confused and hallucinating during the initial evaluation. He improved during the course hospitalization was seen by the psychiatric consultation service on 10/08/2019. The patient stated that [facility] on a 3-day a week basis. He had been clean for several months until relapsing on alcohol recently. He had also last been psychiatrically hospitalized at old [facility] approximately a month prior to  admission. He reported drinking somewhere between 1/2 to 1 pint of alcohol weekly. The patient stated in the psychiatric consultation that the overdose was not a suicide attempt. He was discharged from the medical unit on 10/10/2019 and transferred to our facility for psychiatric treatment. He was last admitted to our facility in June of this year. He endorsed suicidal ideation at that time. He stated he was planning on overdosing on pills. He did admit to a significant history of alcohol abuse. He stated at that time he was drinking 1/5 of alcohol daily. He also had a history of blackouts as well as a seizure when using LSD. He was not discharged with any medications, and was referred for outpatient treatment. In September of this year and a primary care note it appears he was taken fluoxetine, clonazepam, naltrexone, Sonata. And a primary care visit on 08/21/2019 again showed clonazepam 1 mg p.o. twice daily, fluoxetine 40 mg p.o. daily, Vyvanse, naltrexone, and the Sonata. His discharge medications from the medical hospital where the fluoxetine 40 mg p.o. daily, folic acid, thiamine and a nicotine patch. We did discuss today that his Vyvanse nor his clonazepam would be continued here at our facility. He is in agreement with that. He was admitted to the hospital for evaluation and stabilization.  This is the second psychiatric discharge summary from this [facility] for this 29 year old Caucasian male with hx of mental illness & substance use disorder. He was brought to the hospital ED for crisis management after a suicide attempt by overdose on benadryl tablets. He was found by his mother & arrived to the hospital with an altered mental status. He was brought to the hospital for evaluation & mood stabilization treatments.    After evaluation of his presenting symptoms, Brett Sanford was recommended for mood stabilization treatments. The medication regimen for his presenting symptoms were discussed & with his  consent initiated. He received, stabilized & was discharged on the medications as listed below on his discharge medication lists. He was also enrolled & participated in the group counseling sessions being offered & held on this unit. He learned coping skills. He presented on this admission, no other pre-existing medical conditions other than minor aches & pain that required treatment. He tolerated his treatment regimen without any adverse effects or reactions reported.   During the course of his hospitalization, the 15-minute checks were adequate to ensure Brett Sanford's safety. Patient did not display any dangerous, violent or suicidal behavior on the unit.  He interacted with patients & staff appropriately, participated appropriately in the group sessions/therapies. His medications were addressed & adjusted to meet his needs. He was recommended for outpatient follow-up care & medication management upon discharge to assure his continuity of care.  At the time of discharge patient is not reporting any acute suicidal/homicidal ideations. He feels more confident about his self & mental health care. He  currently denies any new issues or concerns. Education & supportive counseling provided throughout her hospital stay & upon discharge.   Today upon his discharge evaluation with the attending psychiatrist, Brett Sanford shares he is doing well. He denies any other specific concerns. He is sleeping well. His appetite is good. He denies other physical complaints. He denies AH/VH. He feels that his medications have been helpful & is in agreement to continue his current treatment regimen as recommended. He was able to engage in safety planning including plan to return to [facility] or contact emergency services if he feels unable to maintain his own safety or the safety of others. Pt had no further questions, comments, or concerns. He left [facility] with all personal belongings in no apparent distress. Transportation per his family  (mother).  Physical Findings: AIMS: Facial and Oral Movements Muscles of Facial Expression: None, normal Lips and Perioral Area: None, normal Jaw: None, normal Tongue: None, normal,Extremity Movements Upper (arms, wrists, hands, fingers): None, normal Lower (legs, knees, ankles, toes): None, normal, Trunk Movements Neck, shoulders, hips: None, normal, Overall Severity Severity of abnormal movements (highest score from questions above): None, normal Incapacitation due to abnormal movements: None, normal Patient's awareness of abnormal movements (rate only patient's report): No Awareness, Dental Status Current problems with teeth and/or dentures?: No Does patient usually wear dentures?: No  CIWA:  CIWA-Ar Total: 1 COWS:  COWS Total Score: 2  Musculoskeletal: Strength & Muscle Tone: within normal limits Gait & Station: normal Patient leans: N/A  Psychiatric Specialty Exam: Physical Exam  Nursing note and vitals reviewed. Constitutional: He is oriented to person, place, and time. He appears well-developed.  Cardiovascular: Normal rate.  Respiratory: Effort normal.  Genitourinary:    Genitourinary Comments: Deferred   Musculoskeletal:        General: Normal range of motion.     Cervical back: Normal range of motion.  Neurological: He is alert and oriented to person, place, and time.  Skin: Skin is warm and dry.    Review of Systems  Constitutional: Negative for chills, diaphoresis and fever.  HENT: Negative for congestion, rhinorrhea, sneezing and sore throat.   Eyes: Negative for discharge.  Respiratory: Negative for cough, choking, shortness of breath and wheezing.   Cardiovascular: Negative for chest pain and palpitations.  Gastrointestinal: Negative for diarrhea, nausea and vomiting.  Genitourinary: Negative for difficulty urinating.  Musculoskeletal: Negative for arthralgias and myalgias.  Skin: Negative for color change.  Neurological: Negative for dizziness and  headaches.  Psychiatric/Behavioral: Positive for dysphoric mood (Stabilized with medication prior to discharge). Negative for agitation, behavioral problems, confusion, hallucinations, self-injury and sleep disturbance (Stable). The patient is not nervous/anxious (Stable) and is not hyperactive.     Blood pressure 130/78, pulse 81, temperature (!) 97.4 F (36.3 C), temperature source Oral, resp. rate 16, height 5\' 8"  (1.727 m), weight 81.6 kg, SpO2 100 %.Body mass index is 27.37 kg/m.  See Md's discharge SRA   Has this patient used any form of tobacco in the last 30 days? (Cigarettes, Smokeless Tobacco, Cigars, and/or Pipes): Yes, an FDA-approved tobacco cessation medication was recommended at discharge.  Blood Alcohol level:  Lab Results  Component Value Date   ETH 272 (H) 10/06/2019   ETH <10 03/28/2019   Metabolic Disorder Labs:  Lab Results  Component Value Date   HGBA1C 4.4 (L) 10/11/2019   MPG 79.58 10/11/2019   MPG 85.32 03/28/2019   No results found for: PROLACTIN Lab Results  Component Value Date   CHOL 249 (  H) 10/11/2019   TRIG 136 10/11/2019   HDL 107 10/11/2019   CHOLHDL 2.3 10/11/2019   VLDL 27 10/11/2019   LDLCALC 115 (H) 10/11/2019   LDLCALC NOT CALCULATED 03/28/2019   See Psychiatric Specialty Exam and Suicide Risk Assessment completed by Attending Physician prior to discharge.  Discharge destination:  Home  Is patient on multiple antipsychotic therapies at discharge:  No   Has Patient had three or more failed trials of antipsychotic monotherapy by history:  No  Recommended Plan for Multiple Antipsychotic Therapies: NA  Allergies as of 10/17/2019      Reactions   Benadryl [diphenhydramine Hcl] Other (See Comments)   Caused hyperactivity      Medication List    STOP taking these medications   thiamine 100 MG tablet     TAKE these medications     Indication  acamprosate 333 MG tablet Commonly known as: CAMPRAL Take 2 tablets (666 mg total) by  mouth 3 (three) times daily with meals. For alcoholism  Indication: Abuse or Misuse of Alcohol   FLUoxetine 40 MG capsule Commonly known as: PROZAC Take 1 capsule (40 mg total) by mouth daily. For depression What changed: additional instructions  Indication: Depression   folic acid 1 MG tablet Commonly known as: FOLVITE Take 1 tablet (1 mg total) by mouth daily. For Folate replacement Start taking on: October 18, 2019 What changed: additional instructions  Indication: Anemia From Inadequate Folic Acid   hydrOXYzine 25 MG tablet Commonly known as: ATARAX/VISTARIL Take 1 tablet (25 mg total) by mouth 3 (three) times daily as needed for anxiety.  Indication: Feeling Anxious   ibuprofen 400 MG tablet Commonly known as: ADVIL Take 1 tablet (400 mg total) by mouth every 6 (six) hours as needed. (May buy from over the counter): For pain  Indication: Pain   nicotine 14 mg/24hr patch Commonly known as: NICODERM CQ - dosed in mg/24 hours Place 1 patch (14 mg total) onto the skin daily. (May buy from over the counter): For smoking cessation What changed: additional instructions  Indication: Nicotine Addiction   traZODone 50 MG tablet Commonly known as: DESYREL Take 1 tablet (50 mg total) by mouth at bedtime as needed for sleep.  Indication: Trouble Sleeping      Follow-up [facility]. Go to.   Specialty: Behavioral Health Why: You are able to to resume IOP services at discharge. Sessions are Monday, Wednesday, and Friday at 10:30am. Your medication management appointment will occur on the date you resume IOP. Bring your hospital discharge paperwork with you. Contact information: [address] [place] [address] [phone]        [facility] Call.   Why: If you wish to pursue residential substance use treatment, please call and ask to speak with an intake clinician. Contact information: [address] [place]  [phone]        Services, [facility]. Call.   Why: If you wish to pursue residential substance use treatment, please call and ask to speak with an intake cooridinator, Ms.June. This facility is able to accept clients who do not have private health insurance.  Contact information: [address] [place] [id number] [phone]          Follow-up recommendations:  Activity:  As tolerated Diet: As recommended by your primary care doctor. Keep all scheduled follow-up appointments as recommended.  Comments: Prescriptions given at discharge.  Patient agreeable to plan.  Given  opportunity to ask questions.  Appears to feel comfortable with discharge denies any current suicidal or homicidal thought. Patient is also instructed prior to discharge to: Take all medications as prescribed by his/her mental healthcare provider. Report any adverse effects and or reactions from the medicines to his/her outpatient provider promptly. Patient has been instructed & cautioned: To not engage in alcohol and or illegal drug use while on prescription medicines. In the event of worsening symptoms, patient is instructed to call the crisis hotline, 911 and or go to the nearest ED for appropriate evaluation and treatment of symptoms. To follow-up with his/her primary care provider for your other medical issues, concerns and or health care needs.  Signed: Armandina Stammer, NP, PMHNP, FNP-BC 10/17/2019, 3:20 PM   Patient seen, Suicide Assessment Completed.  Disposition Plan Reviewed

## 2019-10-17 NOTE — Progress Notes (Signed)
Patient ID: Brett Sanford, male   DOB: March 30, 1991, 29 y.o.   MRN: [id number] Patient discharged to home/self care on her his own accord.  Patient denies SI, HI and AVH upon discharge.  Patient acknowledged understanding of all discharge instructions and receipt of personal belongings upon discharge.

## 2019-10-17 NOTE — BHH Suicide Risk Assessment (Signed)
[facility] Discharge Suicide Risk Assessment   Principal Problem: MDD (major depressive disorder) Discharge Diagnoses: Principal Problem:   MDD (major depressive disorder)   Total Time spent with patient: 30 minutes  Musculoskeletal: Strength & Muscle Tone: within normal limits Gait & Station: normal Patient leans: N/A  Psychiatric Specialty Exam: Review of Systems denies headache, no chest pain, no shortness of breath, no vomiting , no fever or chills   Blood pressure 130/78, pulse 81, temperature (!) 97.4 F (36.3 C), temperature source Oral, resp. rate 16, height 5\' 8"  (1.727 m), weight 81.6 kg, SpO2 100 %.Body mass index is 27.37 kg/m.  General Appearance: Well Groomed  Eye Contact::  Good  Speech:  Normal Rate409  Volume:  Normal  Mood:  reports he is feeling better than on admission, describes mood as 7/10 with 10 being best   Affect:  Appropriate and appropriate  Thought Process:  Linear and Descriptions of Associations: Intact  Orientation:  Full (Time, Place, and Person)  Thought Content:  no hallucinations, no delusions   Suicidal Thoughts:  No denies suicidal or self injurious ideations, denies violent or homicidal ideations  Homicidal Thoughts:  No  Memory:  recent and remote grossly intact   Judgement:  Other:  improving   Insight:  fair- improving   Psychomotor Activity:  Normal  Concentration:  Good  Recall:  Good  Fund of Knowledge:Good  Language: Good  Akathisia:  Negative  Handed:  Right  AIMS (if indicated):     Assets:  Communication Skills Desire for Improvement Resilience  Sleep:  Number of Hours: 6.25  Cognition: WNL  ADL's:  Intact   Mental Status Per Nursing Assessment::   On Admission:  Suicidal ideation indicated by patient  Demographic Factors:  28, no children, currently unemployed , lives with mother  Loss Factors: Unemployment, 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, alcohol use disorder  Historical Factors: History of depression,  history of alcohol use disorder , past history of opiate use disorder, 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 Denies medication side effects- currently on Prozac and Campral. Side effects reviewed. Behavior on unit calm and in good control, polite on approach. T3, T4 WNL.   Cognitive Features That Contribute To Risk:  No gross cognitive deficits noted upon discharge. Is alert , attentive, and oriented x 3   Suicide Risk:  Mild:  Suicidal ideation of limited frequency, intensity, duration, and specificity.  There are no identifiable plans, no associated intent, mild dysphoria and related symptoms, good self-control (both objective and subjective assessment), few other risk factors, and identifiable protective factors, including available and accessible social support.  Follow-up Information    [facility]. Go to.   Specialty: Behavioral Health Why: You are able to to resume IOP services at discharge. Sessions are Monday, Wednesday, and Friday at 10:30am. Your medication management appointment will occur on the date you resume IOP. Bring your hospital discharge paperwork with you. Contact information: [address] [place] [phone]        [facility] Call.  Why: If you wish to pursue residential substance use treatment, please call and ask to speak with an intake clinician. Contact information: [place] [address] [phone]        Services, [facility].  Call.   Why: If you wish to pursue residential substance use treatment, please call and ask to speak with an intake cooridinator, Ms.June. This facility is able to accept clients who do not have private health insurance.  Contact information: [place] [address] [phone]           Plan Of Care/Follow-up recommendations:  Activity:  as tolerated  Diet:  regular Tests:  NA Other:  See below  Patient is expressing readiness for discharge and is leaving unit in good spirits  Plans to return home- mother will pick him up later today Plans to follow up as above   Jenne Campus, MD 10/17/2019, 1:14 PM

## 2019-10-17 NOTE — Progress Notes (Signed)
  [facility] Adult Case Management Discharge Plan :  Will you be returning to the same living situation after discharge:  Yes,  home with family. At discharge, do you have transportation home?: Yes,  mother will pick up after 12pm. Do you have the ability to pay for your medications: Yes,  BCBS.  Release of information consent forms completed and in the chart.  Patient to Follow up at: Follow-up Information    Inc[facility]. Go to.   Specialty: Behavioral Health Why: You are able to to resume IOP services at discharge. Sessions are Monday, Wednesday, and Friday at 10:30am. Your medication management appointment will occur on the date you resume IOP. Bring your hospital discharge paperwork with you. Contact information: [address] [place] [address] [phone]        [facility] Call.   Why: If you wish to pursue residential substance use treatment, please call and ask to speak with an intake clinician. Contact information: [address] [place] [address] [phone]        Services, [facility]. Call.   Why: If you wish to pursue residential substance use treatment, please call and ask to speak with an intake cooridinator, Ms.June. This facility is able to accept clients who do not have private health insurance.  Contact information: [address] [place] [address] [phone]           Next level of care provider has access to [facility] Link:no  Safety Planning and Suicide Prevention discussed: Yes,  with patient. Patient declined consents.   Has patient been referred to the Quitline?: N/A patient is not a smoker  Patient has been referred for addiction treatment: Yes  Darreld Mclean, LCSWA 10/17/2019, 9:17 AM

## 2020-04-14 ENCOUNTER — Ambulatory Visit (HOSPITAL_COMMUNITY)
Admission: AD | Admit: 2020-04-14 | Discharge: 2020-04-14 | Disposition: A | Discharge status: Home / Self Care | Payer: Self-pay | Attending: Psychiatry | Admitting: Psychiatry

## 2020-04-14 DIAGNOSIS — Z01812 Encounter for preprocedural laboratory examination: Secondary | ICD-10-CM | POA: Insufficient documentation

## 2020-04-14 DIAGNOSIS — Z20822 Contact with and (suspected) exposure to covid-19: Secondary | ICD-10-CM | POA: Insufficient documentation

## 2020-04-14 NOTE — BH Assessment (Signed)
Comprehensive Clinical Assessment (CCA) Screening, Triage and Referral Note  04/14/2020 Brett Sanford [id number]   Patient presenting to [facility] as a walk-in due to current SI with plan. Patient reported onset for past 3 days. Patient reported ongoing stressors include,  Relationships and Patient reported worsening depressive symptoms. Patient reported suicidal attempt on 6 months ago by attempted overdose on sleeping medications. Patient reported self-harming behaviors of burning self with cigarette. Patient reported 4-5 hours sleep nightly and normal appetite. Patient not able to contract for safety, stating "if I was in my right mind I could contract for safety". Clinician asked, are in your right mind, patient said "no". Patient reported worsening depressive symptoms, stating inability to get out of bed and groom himself within the past 4 days.  Collateral contact with permission   Brett Rakers, NP, patient meets inpatient criteria. AC accepted patient to [facility] Adult Unit.   Visit Diagnosis: Major depressive disorder  Patient Reported Information How did you hear about [place]? Self   Referral name: No data recorded  Referral phone number: No data recorded Whom do you see for routine medical problems? Primary Care   Practice/Facility Name: [facility]   Practice/Facility Phone Number: No data recorded  Name of Contact: No data recorded  Contact Number: No data recorded  Contact Fax Number: No data recorded  Prescriber Name: No data recorded  Prescriber Address (if known): No data recorded What Is the Reason for Your Visit/Call Today? No data recorded How Long Has This Been Causing You Problems? <Week  Have You Recently Been in Any Inpatient Treatment (Hospital/Detox/Crisis Center/28-Day Program)? No   Name/Location of Program/Hospital:No data recorded  How Long Were You There? No data recorded  When Were You Discharged? No data recorded Have You Ever Received Services From [facility]  Before? No   Who Do You See at [facility]? No data recorded Have You Recently Had Any Thoughts About Hurting Yourself? Yes   Are You Planning to Commit Suicide/Harm Yourself At This time?  Yes  Have you Recently Had Thoughts About Hurting Someone Brett Sanford? No   Explanation: No data recorded Have You Used Any Alcohol or Drugs in the Past 24 Hours? Yes   How Long Ago Did You Use Drugs or Alcohol?  0300   What Did You Use and How Much? vodka, unable to assess how much  What Do You Feel Would Help You the Most Today? Other (Comment) (inpatient)  Do You Currently Have a Therapist/Psychiatrist? No   Name of Therapist/Psychiatrist: No data recorded  Have You Been Recently Discharged From Any Office Practice or Programs? No   Explanation of Discharge From Practice/Program:  No data recorded    CCA Screening Triage Referral Assessment Type of Contact: Face-to-Face   Is this Initial or Reassessment? No data recorded  Date Telepsych consult ordered in [facility]:  No data recorded  Time Telepsych consult ordered in [facility]:  No data recorded Patient Reported Information Reviewed? Yes   Patient Left Without Being Seen? No data recorded  Reason for Not Completing Assessment: No data recorded Collateral Involvement: Brett Sanford, mother, [phone], consent recieved from patient to speak with mother  Does Patient Have a Court Appointed Legal Guardian? No data recorded  Name and Contact of Legal Guardian:  No data recorded If Minor and Not Living with Parent(s), Who has Custody? No data recorded Is CPS involved or ever been involved? Never  Is APS involved or ever been involved? Never  Patient Determined To Be  At Risk for Harm To Self or Others Based on Review of Patient Reported Information or Presenting Complaint? Yes, for Self-Harm   Method: No data recorded  Availability of Means: No data recorded  Intent: No data recorded  Notification Required: No data recorded  Additional Information  for Danger to Others Potential:  No data recorded  Additional Comments for Danger to Others Potential:  No data recorded  Are There Guns or Other Weapons in Your Home?  No data recorded   Types of Guns/Weapons: No data recorded   Are These Weapons Safely Secured?                              No data recorded   Who Could Verify You Are Able To Have These Secured:    No data recorded Do You Have any Outstanding Charges, Pending Court Dates, Parole/Probation? No data recorded Contacted To Inform of Risk of Harm To Self or Others: Family/Significant Other:  Location of Assessment: No data recorded Does Patient Present under Involuntary Commitment? No   IVC Papers Initial File Date: No data recorded  [place] of Residence: [place]  Patient Currently Receiving the Following Services: Not Receiving Services   Determination of Need: Emergent (2 hours)   Options For Referral: Inpatient Hospitalization   Brett Sanford, [facility]

## 2020-04-15 ENCOUNTER — Other Ambulatory Visit: Payer: Self-pay

## 2020-04-15 ENCOUNTER — Encounter (HOSPITAL_COMMUNITY): Payer: Self-pay | Admitting: Behavioral Health

## 2020-04-15 ENCOUNTER — Inpatient Hospital Stay (HOSPITAL_COMMUNITY)
Admission: AD | Admit: 2020-04-15 | Discharge: 2020-04-18 | DRG: 881 | Disposition: A | Discharge status: Home / Self Care | Payer: Federal, State, Local not specified - Other | Attending: Psychiatry | Admitting: Psychiatry

## 2020-04-15 DIAGNOSIS — F17210 Nicotine dependence, cigarettes, uncomplicated: Secondary | ICD-10-CM | POA: Diagnosis present

## 2020-04-15 DIAGNOSIS — F411 Generalized anxiety disorder: Secondary | ICD-10-CM | POA: Diagnosis present

## 2020-04-15 DIAGNOSIS — F329 Major depressive disorder, single episode, unspecified: Principal | ICD-10-CM | POA: Diagnosis present

## 2020-04-15 DIAGNOSIS — F1020 Alcohol dependence, uncomplicated: Secondary | ICD-10-CM | POA: Diagnosis present

## 2020-04-15 DIAGNOSIS — G4700 Insomnia, unspecified: Secondary | ICD-10-CM | POA: Diagnosis present

## 2020-04-15 DIAGNOSIS — Z20822 Contact with and (suspected) exposure to covid-19: Secondary | ICD-10-CM | POA: Diagnosis present

## 2020-04-15 DIAGNOSIS — F332 Major depressive disorder, recurrent severe without psychotic features: Secondary | ICD-10-CM

## 2020-04-15 DIAGNOSIS — F322 Major depressive disorder, single episode, severe without psychotic features: Secondary | ICD-10-CM | POA: Diagnosis present

## 2020-04-15 DIAGNOSIS — R45851 Suicidal ideations: Secondary | ICD-10-CM | POA: Diagnosis present

## 2020-04-15 DIAGNOSIS — F1220 Cannabis dependence, uncomplicated: Secondary | ICD-10-CM | POA: Diagnosis present

## 2020-04-15 DIAGNOSIS — Z915 Personal history of self-harm: Secondary | ICD-10-CM

## 2020-04-15 LAB — HEPATIC FUNCTION PANEL
ALT: 22 U/L (ref 0–44)
AST: 23 U/L (ref 15–41)
Albumin: 4.3 g/dL (ref 3.5–5.0)
Alkaline Phosphatase: 55 U/L (ref 38–126)
Bilirubin, Direct: 0.2 mg/dL (ref 0.0–0.2)
Indirect Bilirubin: 1.7 mg/dL — ABNORMAL HIGH (ref 0.3–0.9)
Total Bilirubin: 1.9 mg/dL — ABNORMAL HIGH (ref 0.3–1.2)
Total Protein: 7.3 g/dL (ref 6.5–8.1)

## 2020-04-15 LAB — LIPID PANEL
Cholesterol: 258 mg/dL — ABNORMAL HIGH (ref 0–200)
HDL: 85 mg/dL (ref >40)
Total CHOL/HDL Ratio: 3 RATIO
Triglycerides: 244 mg/dL — ABNORMAL HIGH (ref <150)
VLDL: 49 mg/dL — ABNORMAL HIGH (ref 0–40)

## 2020-04-15 LAB — COMPREHENSIVE METABOLIC PANEL
ALT: 23 U/L (ref 0–44)
AST: 22 U/L (ref 15–41)
Albumin: 4.5 g/dL (ref 3.5–5.0)
Alkaline Phosphatase: 56 U/L (ref 38–126)
Anion gap: 10 (ref 5–15)
BUN: 19 mg/dL (ref 6–20)
CO2: 25 mmol/L (ref 22–32)
Calcium: 9 mg/dL (ref 8.9–10.3)
Chloride: 103 mmol/L (ref 98–111)
Creatinine, Ser: 1.07 mg/dL (ref 0.61–1.24)
GFR calc Af Amer: >60 mL/min (ref >60)
GFR calc non Af Amer: >60 mL/min (ref >60)
Glucose, Bld: 99 mg/dL (ref 70–99)
Potassium: 3.9 mmol/L (ref 3.5–5.1)
Sodium: 138 mmol/L (ref 135–145)
Total Bilirubin: 1.6 mg/dL — ABNORMAL HIGH (ref 0.3–1.2)
Total Protein: 7.6 g/dL (ref 6.5–8.1)

## 2020-04-15 LAB — URINALYSIS, COMPLETE (UACMP) WITH MICROSCOPIC
Bilirubin Urine: NEGATIVE
Glucose, UA: NEGATIVE mg/dL
Hgb urine dipstick: NEGATIVE
Ketones, ur: NEGATIVE mg/dL
Leukocytes,Ua: NEGATIVE
Nitrite: NEGATIVE
Protein, ur: NEGATIVE mg/dL
Specific Gravity, Urine: 1.023 (ref 1.005–1.030)
pH: 6 (ref 5.0–8.0)

## 2020-04-15 LAB — MAGNESIUM: Magnesium: 2.2 mg/dL (ref 1.7–2.4)

## 2020-04-15 LAB — ETHANOL: Alcohol, Ethyl (B): <10 mg/dL (ref <10)

## 2020-04-15 LAB — RAPID URINE DRUG SCREEN, HOSP PERFORMED
Amphetamines: NOT DETECTED
Barbiturates: NOT DETECTED
Benzodiazepines: NOT DETECTED
Cocaine: NOT DETECTED
Opiates: NOT DETECTED
Tetrahydrocannabinol: POSITIVE — AB

## 2020-04-15 LAB — HEMOGLOBIN A1C
Hgb A1c MFr Bld: 5 % (ref 4.8–5.6)
Mean Plasma Glucose: 96.8 mg/dL

## 2020-04-15 LAB — TSH: TSH: 2.832 u[IU]/mL (ref 0.350–4.500)

## 2020-04-15 LAB — SARS CORONAVIRUS 2 BY RT PCR (HOSPITAL ORDER, PERFORMED IN ~~LOC~~ HOSPITAL LAB): SARS Coronavirus 2: NEGATIVE

## 2020-04-15 MED ORDER — MAGNESIUM HYDROXIDE 400 MG/5ML PO SUSP: 30.0000 mL | Freq: Every day | ORAL | Status: DC | PRN

## 2020-04-15 MED ORDER — VENLAFAXINE HCL ER 37.5 MG PO CP24
37.5000 mg | ORAL_CAPSULE | Freq: Every day | ORAL | Status: DC
  Administered 2020-04-15 – 2020-04-16 (×2): 37.5 mg via ORAL
  Filled 2020-04-15 (×5): qty 1

## 2020-04-15 MED ORDER — HYDROXYZINE HCL 25 MG PO TABS
25.0000 mg | ORAL_TABLET | Freq: Once | ORAL | Status: AC
  Administered 2020-04-15: 25 mg via ORAL
  Filled 2020-04-15 (×2): qty 1

## 2020-04-15 MED ORDER — ALUM & MAG HYDROXIDE-SIMETH 200-200-20 MG/5ML PO SUSP: 30.0000 mL | ORAL | Status: DC | PRN

## 2020-04-15 MED ORDER — ADULT MULTIVITAMIN W/MINERALS CH
1.0000 | ORAL_TABLET | Freq: Every day | ORAL | Status: DC
  Administered 2020-04-15 – 2020-04-18 (×4): 1 via ORAL
  Filled 2020-04-15 (×7): qty 1

## 2020-04-15 MED ORDER — TRAZODONE HCL 50 MG PO TABS
50.0000 mg | ORAL_TABLET | Freq: Every evening | ORAL | Status: DC | PRN
  Administered 2020-04-15 – 2020-04-17 (×3): 50 mg via ORAL
  Filled 2020-04-15: qty 1
  Filled 2020-04-15: qty 7
  Filled 2020-04-15: qty 1

## 2020-04-15 MED ORDER — ACETAMINOPHEN 325 MG PO TABS: 650.0000 mg | ORAL_TABLET | Freq: Four times a day (QID) | ORAL | Status: DC | PRN

## 2020-04-15 MED ORDER — THIAMINE HCL 100 MG/ML IJ SOLN
100.0000 mg | Freq: Once | INTRAMUSCULAR | Status: AC
  Administered 2020-04-15: 100 mg via INTRAMUSCULAR
  Filled 2020-04-15: qty 2

## 2020-04-15 MED ORDER — ONDANSETRON 4 MG PO TBDP: 4.0000 mg | ORAL_TABLET | Freq: Four times a day (QID) | ORAL | Status: DC | PRN

## 2020-04-15 MED ORDER — THIAMINE HCL 100 MG PO TABS
100.0000 mg | ORAL_TABLET | Freq: Every day | ORAL | Status: DC
  Administered 2020-04-16 – 2020-04-18 (×3): 100 mg via ORAL
  Filled 2020-04-15 (×5): qty 1

## 2020-04-15 MED ORDER — HYDROXYZINE HCL 25 MG PO TABS
25.0000 mg | ORAL_TABLET | Freq: Four times a day (QID) | ORAL | Status: DC | PRN
Start: 2020-04-15 — End: 2020-04-15

## 2020-04-15 MED ORDER — NICOTINE 21 MG/24HR TD PT24
21.0000 mg | MEDICATED_PATCH | Freq: Every day | TRANSDERMAL | Status: DC
  Administered 2020-04-15 – 2020-04-18 (×4): 21 mg via TRANSDERMAL
  Filled 2020-04-15 (×7): qty 1

## 2020-04-15 MED ORDER — HYDROXYZINE HCL 25 MG PO TABS: 25.0000 mg | ORAL_TABLET | Freq: Three times a day (TID) | ORAL | Status: DC | PRN

## 2020-04-15 MED ORDER — LOPERAMIDE HCL 2 MG PO CAPS: 2.0000 mg | ORAL_CAPSULE | ORAL | Status: DC | PRN

## 2020-04-15 MED ORDER — LORAZEPAM 1 MG PO TABS: 1.0000 mg | ORAL_TABLET | Freq: Four times a day (QID) | ORAL | Status: DC | PRN

## 2020-04-15 NOTE — BHH Counselor (Signed)
Adult Comprehensive Assessment  Patient ID: Brett Sanford, male   DOB: 12/04/1990, 29 y.o.   MRN: [id number]  Information Source: Information source: Patient  Current Stressors:  Patient states their primary concerns and needs for treatment are:: "having some bad thoughts" Patient states their goals for this hospitilization and ongoing recovery are:: "get back on medications" Educational / Learning stressors: Pt denies. Employment / Job issues: "just the typical stuff" Family Relationships: Pt denies. Financial / Lack of resources (include bankruptcy): "not financially where I want to be" Housing / Lack of housing: "I live with my mom" Physical health (include injuries & life threatening diseases): Pt denies. Social relationships: "just butting heads" Substance abuse: "Alcohol" Bereavement / Loss: Pt denies.  Living/Environment/Situation:  Living Arrangements: Parent Who else lives in the home?: Mother How long has patient lived in current situation?: "1 year" What is atmosphere in current home: Loving, Supportive, Comfortable  Family History:  Marital status: Single Are you sexually active?: Yes What is your sexual orientation?: "straight" Has your sexual activity been affected by drugs, alcohol, medication, or emotional stress?: Pt denies. Does patient have children?: No  Childhood History:  By whom was/is the patient raised?: Both parents, Mother Additional childhood history information: Mother and father until father passed away when patient was 23. Description of patient's relationship with caregiver when they were a child: "pretty good" Patient's description of current relationship with people who raised him/her: "decent" How were you disciplined when you got in trouble as a child/adolescent?: "depends, I could get a spanking or be grounded" Does patient have siblings?: Yes Number of Siblings: 2 Description of patient's current relationship with siblings: "pretty  good" Did patient suffer from severe childhood neglect?: No Has patient ever been sexually abused/assaulted/raped as an adolescent or adult?: No Was the patient ever a victim of a crime or a disaster?: No Witnessed domestic violence?: No Has patient been affected by domestic violence as an adult?: No  Education:  Highest grade of school patient has completed: 12th Currently a student?: No Learning disability?: Yes What learning problems does patient have?: Pt reports that he had a 504 plan for ADHD.  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 Has patient ever been in the [employer]?: No  Financial Resources:   Surveyor, quantity resources: [employer], Income from employment Does patient have a Lawyer or guardian?: No  Alcohol/Substance Abuse:   What has been your use of drugs/alcohol within the last 12 months?: Alcohol: "a 5th a week" last use 04/14/2020 If attempted suicide, did drugs/alcohol play a role in this?: Yes (Pt reports that he attempted suicide in December 2020 by overdose.) Alcohol/Substance Abuse Treatment Hx: Past Tx, Inpatient, Past Tx, Outpatient If yes, describe treatment: [facility], [facility] and IOP at [facility] Has alcohol/substance abuse ever caused legal problems?: No  Social Support System:   Patient's Community Support System: Good Describe Community Support System: "my mom and my friend" Type of faith/religion: Pt denies.  Leisure/Recreation:   Do You Have Hobbies?: Yes Leisure and Hobbies: "fishing"  Strengths/Needs:   What is the patient's perception of their  strengths?: "I'm a Counselling psychologist, dedicated and  have a good heart.  I care." Patient states they can use these personal strengths during their treatment to contribute to their recovery: Pt denies. Patient states these barriers may affect/interfere with their treatment: Pt denies. Patient states these barriers may affect their return to the community: Pt denies.  Discharge Plan:   Currently receiving community mental health services: No Patient states concerns and preferences for aftercare planning are: Pt reports that he is open to a referral for outpatient. Patient states they will know when they are safe and ready for discharge when: "when I stop thinking stupid" Does patient have access to transportation?: Yes Does patient have financial barriers related to discharge medications?: Yes Patient description of barriers related to discharge medications: Pt reports he does not have insurance. Will patient be returning to same living situation after discharge?: Yes  Summary/Recommendations:   Summary and Recommendations (to be completed by the evaluator): Patient is a 29 year old male from [place], [place] [facility] [place]).  He reports that he is currently employed at [employer] and does not have insurance at this time.  He presents to the hospital voluntarily for suicidal ideations.  He has a primary diagnosis of Major Depressive Disorder.  Recommendations include: crisis stabilization, therapeutic milieu, encourage group attendance and participation, medication management for detox/mood stabilization and development of comprehensive mental wellness/sobriety plan.  Harden Mo. 04/15/2020

## 2020-04-15 NOTE — Progress Notes (Signed)
[place] NOVEL CORONAVIRUS (COVID-19) DAILY CHECK-OFF SYMPTOMS - answer yes or no to each - every day NO YES  Have you had a fever in the past 24 hours?  . Fever (Temp > 37.80C / 100F) X   Have you had any of these symptoms in the past 24 hours? . New Cough .  Sore Throat  .  Shortness of Breath .  Difficulty Breathing .  Unexplained Body Aches   X   Have you had any one of these symptoms in the past 24 hours not related to allergies?   . Runny Nose .  Nasal Congestion .  Sneezing   X   If you have had runny nose, nasal congestion, sneezing in the past 24 hours, has it worsened?  X   EXPOSURES - check yes or no X   Have you traveled outside the state in the past 14 days?  X   Have you been in contact with someone with a confirmed diagnosis of COVID-19 or PUI in the past 14 days without wearing appropriate PPE?  X   Have you been living in the same home as a person with confirmed diagnosis of COVID-19 or a PUI (household contact)?    X   Have you been diagnosed with COVID-19?    X              What to do next: Answered NO to all: Answered YES to anything:   Proceed with unit schedule Follow the [facility] Inpatient Flowsheet.   

## 2020-04-15 NOTE — Tx Team (Signed)
Initial Treatment Plan 04/15/2020 3:11 AM Brett Sanford ZSW:[id number]    PATIENT STRESSORS: Financial difficulties Marital or family conflict Occupational concerns   PATIENT STRENGTHS: Average or above average intelligence Motivation for treatment/growth Supportive family/friends   PATIENT IDENTIFIED PROBLEMS: Suicidal ideations  Depression   Not taking medications for past 6 months  (Pt wants to work on staying calm and getting back on meds)               DISCHARGE CRITERIA:  Improved stabilization in mood, thinking, and/or behavior Motivation to continue treatment in a less acute level of care Verbal commitment to aftercare and medication compliance  PRELIMINARY DISCHARGE PLAN: Attend aftercare/continuing care group Attend PHP/IOP Outpatient therapy Return to previous living arrangement Return to previous work or school arrangements  PATIENT/FAMILY INVOLVEMENT: This treatment plan has been presented to and reviewed with the patient, Brett Sanford, and/or family member.  The patient and family have been given the opportunity to ask questions and make suggestions.  Victorino December, RN 04/15/2020, 3:11 AM

## 2020-04-15 NOTE — Progress Notes (Signed)
   04/15/20 0015  Psych Admission Type (Psych Patients Only)  Admission Status Voluntary  Psychosocial Assessment  Patient Complaints Agitation;Depression;Anxiety  Eye Contact Brief  Facial Expression Sad  Affect Depressed  Speech Logical/coherent  Interaction Assertive  Motor Activity Other (Comment) (WNL)  Appearance/Hygiene Unremarkable  Behavior Characteristics Cooperative  Mood Depressed;Sad;Pleasant  Thought Process  Coherency WDL  Content WDL  Delusions None reported or observed  Perception WDL  Hallucination None reported or observed  Judgment Poor  Confusion None  Danger to Self  Current suicidal ideation? Passive  Self-Injurious Behavior No self-injurious ideation or behavior indicators observed or expressed   Agreement Not to Harm Self Yes  Description of Agreement verbal  Danger to Others  Danger to Others None reported or observed   Pt endorses passive SI but agrees to approach this Clinical research associate or other staff member if those feelings turn into wanting to harm himself.

## 2020-04-15 NOTE — H&P (Addendum)
Psychiatric Admission Assessment Adult  Patient Identification: Brett Sanford MRN:  [id number] Date of Evaluation:  04/15/2020 Chief Complaint: SI Principal Diagnosis: MDD (major depressive disorder) Diagnosis:  Principal Problem:   MDD (major depressive disorder)  History of Present Illness:   Brett Sanford is a 29 y.o male who presents to [facility] voluntraily as a walk-in for SI. Pt reports he has been feeling suicidal for 3 days due to family issues and work stress. Pt reports he works 56 hours a week and has been doing this for 6 months now. Pt reports symptoms of depression, anxiety, irritability, isolation, tearfulness, guilt, worthlessness, helplessness and hopelessness. Pt endorses SI with plans to overdose on pills, cut self, suffocate or drink self to death. Pt endorses a history of self harm, states he burn himself on the arm with cigarettes a week ago. Pt reports a history of prior suicidal attempt, last was 6 months ago where he overdose on OTC sleep medication. Pt denies AVH and paranoia. Pt denies any access to guns or weapons. He reports using 1/4 liquor a week and less than a gram of THC daily. Pt does not see a therapist or psychiatrist due to not having insurance. Pt states he was on Zoloft, Klonazepam and Vyvanse in the past. Pt states he sleeps 4-5 hours daily and has a good appetite. Pt reports she is unable to contract for safety.  During evaluation pt is sitting; he is alert/oriented x 4; cooperative; and mood is depressed/anxious congruent with affect.  Pt is speaking in a clear tone at moderate volume, and normal pace; with good eye contact. His thought process is coherent and relevant; There is no indication that he is currently responding to internal/external stimuli or experiencing delusional thought content.  Pt's insight is fair, judgement and impulse control is poor.  Associated Signs/Symptoms: Depression Symptoms:  depressed mood, feelings of  worthlessness/guilt, hopelessness, suicidal thoughts with specific plan, anxiety, disturbed sleep, (Hypo) Manic Symptoms:  Irritable Mood, Anxiety Symptoms:  Excessive Worry, Psychotic Symptoms:  NA PTSD Symptoms: NA Total Time spent with patient: 30 minutes  Past Psychiatric History: MDD, alcohol abuse and ADHD  Is the patient at risk to self? Yes.    Has the patient been a risk to self in the past 6 months? No.  Has the patient been a risk to self within the distant past? No.  Is the patient a risk to others? No.  Has the patient been a risk to others in the past 6 months? No.  Has the patient been a risk to others within the distant past? No.   Prior Inpatient Therapy:   Prior Outpatient Therapy:    Alcohol Screening: 1. How often do you have a drink containing alcohol?: 4 or more times a week (drinks shots of liquor 5-6 days a week) 2. How many drinks containing alcohol do you have on a typical day when you are drinking?: 3 or 4 3. How often do you have six or more drinks on one occasion?: Less than monthly AUDIT-C Score: 6 4. How often during the last year have you found that you were not able to stop drinking once you had started?: Never 5. How often during the last year have you failed to do what was normally expected from you because of drinking?: Never 6. How often during the last year have you needed a first drink in the morning to get yourself going after a heavy drinking session?: Never 7. How often during the last  year have you had a feeling of guilt of remorse after drinking?: Less than monthly 8. How often during the last year have you been unable to remember what happened the night before because you had been drinking?: Never 9. Have you or someone else been injured as a result of your drinking?: No 10. Has a relative or friend or a doctor or another health worker been concerned about your drinking or suggested you cut down?: Yes, but not in the last year Alcohol  Use Disorder Identification Test Final Score (AUDIT): 9 Alcohol Brief Interventions/Follow-up: Alcohol Education (pt cut back on his drinking about a year ago d/t bloodwork results) Substance Abuse History in the last 12 months:  Yes.   Consequences of Substance Abuse: Withdrawal Symptoms:   Diaphoresis Diarrhea Headaches Tremors Previous Psychotropic Medications: Yes  Psychological Evaluations: Yes  Past Medical History:  Past Medical History:  Diagnosis Date  . ADD (attention deficit disorder)   . Drug abuse (HCC)   . Medical history non-contributory     Past Surgical History:  Procedure Laterality Date  . NO PAST SURGERIES     Family History:  Family History  Family history unknown: Yes   Family Psychiatric  History: Unknown Tobacco Screening: Have you used any form of tobacco in the last 30 days? (Cigarettes, Smokeless Tobacco, Cigars, and/or Pipes): Yes Tobacco use, Select all that apply: 5 or more cigarettes per day Are you interested in Tobacco Cessation Medications?: Yes, will notify MD for an order Social History:  Social History   Substance and Sexual Activity  Alcohol Use Yes   Comment: a few shots/5-6 days/week     Social History   Substance and Sexual Activity  Drug Use Yes  . Types: Marijuana   Comment: once a week    Additional Social History:                           Allergies:   Allergies  Allergen Reactions  . Benadryl [Diphenhydramine Hcl] Other (See Comments)    Caused hyperactivity   Lab Results:  Results for orders placed or performed during the hospital encounter of 04/14/20 (from the past 48 hour(s))  SARS Coronavirus 2 by RT PCR (hospital order, performed in [facility] hospital lab) Nasopharyngeal Nasopharyngeal Swab     Status: None   Collection Time: 04/14/20 11:30 PM   Specimen: Nasopharyngeal Swab  Result Value Ref Range   SARS Coronavirus 2 NEGATIVE NEGATIVE    Comment: (NOTE) SARS-CoV-2 target nucleic acids are  NOT DETECTED.  The SARS-CoV-2 RNA is generally detectable in upper and lower respiratory specimens during the acute phase of infection. The lowest concentration of SARS-CoV-2 viral copies this assay can detect is 250 copies / mL. A negative result does not preclude SARS-CoV-2 infection and should not be used as the sole basis for treatment or other patient management decisions.  A negative result may occur with improper specimen collection / handling, submission of specimen other than nasopharyngeal swab, presence of viral mutation(s) within the areas targeted by this assay, and inadequate number of viral copies (<250 copies / mL). A negative result must be combined with clinical observations, patient history, and epidemiological information.  Fact Sheet for Patients:   BoilerBrush.com.cy  Fact Sheet for Healthcare Providers: https://pope.com/  This test is not yet approved or  cleared by the [place] FDA and has been authorized for detection and/or diagnosis of SARS-CoV-2 by FDA under an Emergency Use Authorization (  EUA).  This EUA will remain in effect (meaning this test can be used) for the duration of the COVID-19 declaration under Section 564(b)(1) of the Act, 21 U.S.C. section 360bbb-3(b)(1), unless the authorization is terminated or revoked sooner.  Performed at [facility], [address] [place], [place] [address]     Blood Alcohol level:  Lab Results  Component Value Date   ETH 272 (H) 10/06/2019   ETH <10 03/28/2019    Metabolic Disorder Labs:  Lab Results  Component Value Date   HGBA1C 4.4 (L) 10/11/2019   MPG 79.58 10/11/2019   MPG 85.32 03/28/2019   No results found for: PROLACTIN Lab Results  Component Value Date   CHOL 249 (H) 10/11/2019   TRIG 136 10/11/2019   HDL 107 10/11/2019   CHOLHDL 2.3 10/11/2019   VLDL 27 10/11/2019   LDLCALC 115 (H) 10/11/2019   LDLCALC NOT  CALCULATED 03/28/2019    Current Medications: Current Facility-Administered Medications  Medication Dose Route Frequency Provider Last Rate Last Admin  . acetaminophen (TYLENOL) tablet 650 mg  650 mg Oral Q6H PRN Anike, Adaku C, NP      . alum & mag hydroxide-simeth (MAALOX/MYLANTA) 200-200-20 MG/5ML suspension 30 mL  30 mL Oral Q4H PRN Anike, Adaku C, NP      . hydrOXYzine (ATARAX/VISTARIL) tablet 25 mg  25 mg Oral TID PRN Anike, Adaku C, NP      . magnesium hydroxide (MILK OF MAGNESIA) suspension 30 mL  30 mL Oral Daily PRN Anike, Adaku C, NP      . traZODone (DESYREL) tablet 50 mg  50 mg Oral QHS PRN Anike, Adaku C, NP       PTA Medications: Medications Prior to Admission  Medication Sig Dispense Refill Last Dose  . acamprosate (CAMPRAL) 333 MG tablet Take 2 tablets (666 mg total) by mouth 3 (three) times daily with meals. For alcoholism 180 tablet 0   . FLUoxetine (PROZAC) 40 MG capsule Take 1 capsule (40 mg total) by mouth daily. For depression 30 capsule 0   . folic acid (FOLVITE) 1 MG tablet Take 1 tablet (1 mg total) by mouth daily. For Folate replacement 30 tablet 0   . hydrOXYzine (ATARAX/VISTARIL) 25 MG tablet Take 1 tablet (25 mg total) by mouth 3 (three) times daily as needed for anxiety. 75 tablet 0   . ibuprofen (ADVIL) 400 MG tablet Take 1 tablet (400 mg total) by mouth every 6 (six) hours as needed. (May buy from over the counter): For pain 30 tablet 0   . nicotine (NICODERM CQ - DOSED IN MG/24 HOURS) 14 mg/24hr patch Place 1 patch (14 mg total) onto the skin daily. (May buy from over the counter): For smoking cessation 28 patch 0   . traZODone (DESYREL) 50 MG tablet Take 1 tablet (50 mg total) by mouth at bedtime as needed for sleep. 30 tablet 0     Musculoskeletal: Strength & Muscle Tone: within normal limits Gait & Station: normal Patient leans: N/A  Psychiatric Specialty Exam: Physical Exam HENT:     Head: Normocephalic.  Pulmonary:     Effort: Pulmonary effort  is normal.  Musculoskeletal:        General: Normal range of motion.     Cervical back: Normal range of motion.  Skin:    General: Skin is warm and dry.  Neurological:     Mental Status: He is alert.  Psychiatric:        Attention and Perception:  Attention normal.        Mood and Affect: Mood is anxious and depressed.        Speech: Speech normal.        Behavior: Behavior normal.        Thought Content: Thought content includes suicidal ideation.        Cognition and Memory: Cognition normal.        Judgment: Judgment is inappropriate.     Review of Systems  Psychiatric/Behavioral: Positive for dysphoric mood, self-injury, sleep disturbance and suicidal ideas. Negative for agitation, behavioral problems, confusion, decreased concentration and hallucinations. The patient is nervous/anxious. The patient is not hyperactive.   All other systems reviewed and are negative.   Blood pressure (!) 133/96, pulse 87, temperature 98.4 F (36.9 C), temperature source Oral, resp. rate 16, height 5\' 8"  (1.727 m), weight 98.9 kg, SpO2 99 %.Body mass index is 33.15 kg/m.  General Appearance: Casual  Eye Contact:  Fair  Speech:  Normal Rate  Volume:  Normal  Mood:  Anxious, Depressed and Dysphoric  Affect:  Congruent and Depressed  Thought Process:  Coherent and Descriptions of Associations: Intact  Orientation:  Full (Time, Place, and Person)  Thought Content:  Logical  Suicidal Thoughts:  Yes.  with intent/plan  Homicidal Thoughts:  No  Memory:  Recent;   Good  Judgement:  Poor  Insight:  Fair  Psychomotor Activity:  Normal  Concentration:  Concentration: Good  Recall:  Good  Fund of Knowledge:  Good  Language:  Good  Akathisia:  No  Handed:  Right  AIMS (if indicated):     Assets:  Communication Skills Desire for Improvement Housing  ADL's:  Intact  Cognition:  WNL  Sleep:      Disposition: Recommend psychiatric Inpatient admission when medically cleared. Supportive therapy  provided about ongoing stressors.   Treatment Plan Summary: Daily contact with patient to assess and evaluate symptoms and progress in treatment and Medication management  Observation Level/Precautions:  15 minute checks  Laboratory:  Chemistry Profile Folic Acid HbAIC UDS  Psychotherapy:    Medications:    Consultations:    Discharge Concerns:    Estimated LOS:  Other:     Physician Treatment Plan for Primary Diagnosis: MDD (major depressive disorder) Long Term Goal(s): Improvement in symptoms so as ready for discharge  Short Term Goals: Ability to identify changes in lifestyle to reduce recurrence of condition will improve, Ability to verbalize feelings will improve, Ability to demonstrate self-control will improve, Ability to identify and develop effective coping behaviors will improve, Ability to maintain clinical measurements within normal limits will improve and Ability to identify triggers associated with substance abuse/mental health issues will improve  Physician Treatment Plan for Secondary Diagnosis: Principal Problem:   MDD (major depressive disorder)  Long Term Goal(s): Improvement in symptoms so as ready for discharge  Short Term Goals: Ability to identify changes in lifestyle to reduce recurrence of condition will improve, Ability to disclose and discuss suicidal ideas, Ability to demonstrate self-control will improve, Ability to identify and develop effective coping behaviors will improve, Ability to maintain clinical measurements within normal limits will improve and Ability to identify triggers associated with substance abuse/mental health issues will improve  I certify that inpatient services furnished can reasonably be expected to improve the patient's condition.    , NP 7/7/20213:60 AM   29 year old male, single, no children, lives with mother, employed at 27. Presented voluntarily reporting worsening depression, anxiety and  suicidal  ideations over recent days. States he has been having thoughts of overdosing or driving car off a bridge . Denies psychotic symptoms. Endorses some neurovegetative symptoms to include poor sleep, low energy level, some anhedonia.  He attributes worsening depression to being off his psychiatric medications for a period of several months, long working hours , and strained relationship with a good friend. Patient reports history of alcohol use disorder. States he has significantly cut down on his alcohol consumption, but was drinking about a fifth of liquor per week over recent weeks. ( No current BAL or UDS in chart). He has a history of prior psychiatric admissions, most recently  in early January 2021 here at [facility]. At the time presented following a suicide attempt by overdosing on Benadryl and alcohol relapse. He was discharged on Campral and on Prozac at the time . He states he stopped taking medications several months ago due to insurance constraints and also because Fluoxetine was causing sexual dysfunction.Patient currently denies history of mania or of psychosis. History of alcohol use disorder . As noted, reports he has been drinking regularly but significantly less than before He denies history of medical illnesses, no history of seizures . He remembers paradoxical response to antihistamines as a child.   Dx- MDD, no psychotic features, Alcohol Use Disorder by history, consider Alcohol Induced Mood Disorder .  Plan- Inpatient admission. We discussed options- currently prefers not to resume Fluoxetine, which had been prescribed in the past, as it was associated with sexual dysfunction. He has been on Wellbutrin in the past but did not like this medication. Agrees to Effexor XR trial, which he has not been on in the past . Side effects discussed. Start Effexor XR 37.5 mgrs QDAY Ativan PRN for alcohol WDL as per CIWA protocol.

## 2020-04-15 NOTE — Progress Notes (Signed)
   04/15/20 0900  Psych Admission Type (Psych Patients Only)  Admission Status Voluntary  Psychosocial Assessment  Patient Complaints None  Eye Contact Brief  Facial Expression Sad  Affect Depressed  Speech Logical/coherent  Interaction Assertive  Motor Activity Other (Comment) (WNL)  Appearance/Hygiene Unremarkable  Behavior Characteristics Cooperative  Mood Depressed  Thought Process  Coherency WDL  Content WDL  Delusions None reported or observed  Perception WDL  Hallucination None reported or observed  Judgment Poor  Confusion None  Danger to Self  Current suicidal ideation? Passive  Self-Injurious Behavior No self-injurious ideation or behavior indicators observed or expressed   Agreement Not to Harm Self Yes  Description of Agreement verbal  Danger to Others  Danger to Others None reported or observed

## 2020-04-15 NOTE — [facility] Suicide Risk Assessment (Signed)
[facility] INPATIENT:  Family/Significant Other Suicide Prevention Education  Suicide Prevention Education:  Patient Refusal for Family/Significant Other Suicide Prevention Education: The patient Brett Sanford has refused to provide written consent for family/significant other to be provided Family/Significant Other Suicide Prevention Education during admission and/or prior to discharge.  Physician notified.  SPE completed with pt, as pt refused to consent to family contact. SPI pamphlet provided to pt and pt was encouraged to share information with support network, ask questions, and talk about any concerns relating to SPE. Pt denies access to guns/firearms and verbalized understanding of information provided. Mobile Crisis information also provided to pt.   Harden Mo 04/15/2020, 12:45 PM

## 2020-04-15 NOTE — Progress Notes (Signed)
   04/15/20 2007  Charting Type  Charting Type Shift assessment  Orders Chart Check (once per shift) Completed  Safety Check Verification  Has the RN verified the 15 minute safety check completion? Yes  Neurological  Neuro (WDL) WDL  HEENT  HEENT (WDL) WDL  Respiratory  Respiratory (WDL) WDL  Cardiac  Cardiac (WDL) WDL  Vascular  Vascular (WDL) WDL  Integumentary  Integumentary (WDL) X  Braden Scale (Ages 8 and up)  Sensory Perceptions 4  Moisture 4  Activity 4  Mobility 4  Nutrition 3  Friction and Shear 3  Braden Scale Score 22  Musculoskeletal  Musculoskeletal (WDL) WDL  Gastrointestinal  Gastrointestinal (WDL) WDL  GU Assessment  Genitourinary (WDL) WDL

## 2020-04-15 NOTE — [facility] Suicide Risk Assessment (Signed)
[facility] Admission Suicide Risk Assessment   Nursing information obtained from:  Patient Demographic factors:  Male, Caucasian, Low socioeconomic status Current Mental Status:  Suicidal ideation indicated by patient Loss Factors:  Financial problems / change in socioeconomic status Historical Factors:  Prior suicide attempts Risk Reduction Factors:  Positive social support, Employed, Living with another person, especially a relative  Total Time spent with patient: 45 minutes Principal Problem: MDD (major depressive disorder) Diagnosis:  Principal Problem:   MDD (major depressive disorder)  Subjective Data:   Continued Clinical Symptoms:  Alcohol Use Disorder Identification Test Final Score (AUDIT): 9 The "Alcohol Use Disorders Identification Test", Guidelines for Use in Primary Care, Second Edition.  World Science writer [facility]). Score between 0-7:  no or low risk or alcohol related problems. Score between 8-15:  moderate risk of alcohol related problems. Score between 16-19:  high risk of alcohol related problems. Score 20 or above:  warrants further diagnostic evaluation for alcohol dependence and treatment.   CLINICAL FACTORS:  29 year old male, single, no children, lives with mother, employed at [employer]. Presented voluntarily reporting worsening depression, anxiety and suicidal ideations over recent days. States he has been having thoughts of overdosing or driving car off a bridge . Denies psychotic symptoms. Endorses some neurovegetative symptoms to include poor sleep, low energy level, some anhedonia.  He attributes worsening depression to being off his psychiatric medications for a period of several months, long working hours , and strained relationship with a good friend. Patient reports history of alcohol use disorder. States he has significantly cut down on his alcohol consumption, but was drinking about a fifth of liquor per week over recent weeks. ( No current BAL or UDS  in chart). He has a history of prior psychiatric admissions, most recently  in early January 2021 here at [facility]. At the time presented following a suicide attempt by overdosing on Benadryl and alcohol relapse. He was discharged on Campral and on Prozac at the time . He states he stopped taking medications several months ago due to insurance constraints and also because Fluoxetine was causing sexual dysfunction.Patient currently denies history of mania or of psychosis. History of alcohol use disorder . As noted, reports he has been drinking regularly but significantly less than before He denies history of medical illnesses, no history of seizures . He remembers paradoxical response to antihistamines as a child.   Dx- MDD, no psychotic features, Alcohol Use Disorder by history, consider Alcohol Induced Mood Disorder .  Plan- Inpatient admission. We discussed options- currently prefers not to resume Fluoxetine, which had been prescribed in the past, as it was associated with sexual dysfunction. He has been on Wellbutrin in the past but did not like this medication. Agrees to Effexor XR trial, which he has not been on in the past . Side effects discussed. Start Effexor XR 37.5 mgrs QDAY Ativan PRN for alcohol WDL as per CIWA protocol.        Musculoskeletal: Strength & Muscle Tone: within normal limits- no tremors, no diaphoresis, no restlessness or agitation Gait & Station: normal Patient leans: N/A  Psychiatric Specialty Exam: Physical Exam  Review of Systems no headache, no chest pain, no shortness of breath at room air, no vomiting .   Blood pressure (!) 133/96, pulse 87, temperature 98.4 F (36.9 C), temperature source Oral, resp. rate 16, height 5\' 8"  (1.727 m), weight 98.9 kg, SpO2 99 %.Body mass index is 33.15 kg/m.  General Appearance: Fairly Groomed  Eye Contact:  Good  Speech:  Normal Rate  Volume:  Normal  Mood:  Depressed  Affect:  constricted, but improves during session,  smiles at times during session  Thought Process:  Linear and Descriptions of Associations: Intact  Orientation:  Full (Time, Place, and Person)  Thought Content:  no hallucinations, no delusions   Suicidal Thoughts:  Yes.  without intent/plan reports lingering passive thoughts of death,  but denies any plan or intention and contracts for safety on unit   Homicidal Thoughts:  No  Memory:  recent and remote grossly intact   Judgement:  Fair  Insight:  Fair  Psychomotor Activity:  Normal  Concentration:  Concentration: Good and Attention Span: Good  Recall:  Good  Fund of Knowledge:  Good  Language:  Good  Akathisia:  Negative  Handed:  Right  AIMS (if indicated):     Assets:  Communication Skills Desire for Improvement Resilience  ADL's:  Intact  Cognition:  WNL  Sleep:  Number of Hours: 3      COGNITIVE FEATURES THAT CONTRIBUTE TO RISK:  Closed-mindedness, Loss of executive function and Polarized thinking    SUICIDE RISK:   Moderate:  Frequent suicidal ideation with limited intensity, and duration, some specificity in terms of plans, no associated intent, good self-control, limited dysphoria/symptomatology, some risk factors present, and identifiable protective factors, including available and accessible social support.  PLAN OF CARE: Patient will be admitted to inpatient psychiatric unit for stabilization and safety. Will provide and encourage milieu participation. Provide medication management and maked adjustments as needed. Will also prescribe medication to address potential alcohol WDL , if needed.  Will follow daily.    I certify that inpatient services furnished can reasonably be expected to improve the patient's condition.   Craige Cotta, MD 04/15/2020, 12:45 PM

## 2020-04-15 NOTE — Tx Team (Signed)
Interdisciplinary Treatment and Diagnostic Plan Update  04/15/2020 Time of Session: 1:30PM Brett Sanford MRN: [id number]  Principal Diagnosis: MDD (major depressive disorder)  Secondary Diagnoses: Principal Problem:   MDD (major depressive disorder)   Current Medications:  Current Facility-Administered Medications  Medication Dose Route Frequency Provider Last Rate Last Admin  . acetaminophen (TYLENOL) tablet 650 mg  650 mg Oral Q6H PRN Anike, Adaku C, NP      . loperamide (IMODIUM) capsule 2-4 mg  2-4 mg Oral PRN Cobos, Myer Peer, MD      . LORazepam (ATIVAN) tablet 1 mg  1 mg Oral Q6H PRN Cobos, Myer Peer, MD      . magnesium hydroxide (MILK OF MAGNESIA) suspension 30 mL  30 mL Oral Daily PRN Anike, Adaku C, NP      . multivitamin with minerals tablet 1 tablet  1 tablet Oral Daily Cobos, Fernando A, MD      . nicotine (NICODERM CQ - dosed in mg/24 hours) patch 21 mg  21 mg Transdermal Daily Anike, Adaku C, NP   21 mg at 04/15/20 1241  . thiamine (B-1) injection 100 mg  100 mg Intramuscular Once Cobos, Myer Peer, MD      . Derrill Memo ON 04/16/2020] thiamine tablet 100 mg  100 mg Oral Daily Cobos, Myer Peer, MD      . traZODone (DESYREL) tablet 50 mg  50 mg Oral QHS PRN Anike, Adaku C, NP      . venlafaxine XR (EFFEXOR-XR) 24 hr capsule 37.5 mg  37.5 mg Oral Q breakfast Cobos, Myer Peer, MD       PTA Medications: No medications prior to admission.    Patient Stressors: Financial difficulties Marital or family conflict Occupational concerns  Patient Strengths: Average or above average intelligence Motivation for treatment/growth Supportive family/friends  Treatment Modalities: Medication Management, Group therapy, Case management,  1 to 1 session with clinician, Psychoeducation, Recreational therapy.   Physician Treatment Plan for Primary Diagnosis: MDD (major depressive disorder) Long Term Goal(s): Improvement in symptoms so as ready for discharge Improvement in symptoms so  as ready for discharge   Short Term Goals: Ability to identify changes in lifestyle to reduce recurrence of condition will improve Ability to verbalize feelings will improve Ability to demonstrate self-control will improve Ability to identify and develop effective coping behaviors will improve Ability to maintain clinical measurements within normal limits will improve Ability to identify triggers associated with substance abuse/mental health issues will improve Ability to identify changes in lifestyle to reduce recurrence of condition will improve Ability to disclose and discuss suicidal ideas Ability to demonstrate self-control will improve Ability to identify and develop effective coping behaviors will improve Ability to maintain clinical measurements within normal limits will improve Ability to identify triggers associated with substance abuse/mental health issues will improve  Medication Management: Evaluate patient's response, side effects, and tolerance of medication regimen.  Therapeutic Interventions: 1 to 1 sessions, Unit Group sessions and Medication administration.  Evaluation of Outcomes: Not Met  Physician Treatment Plan for Secondary Diagnosis: Principal Problem:   MDD (major depressive disorder)  Long Term Goal(s): Improvement in symptoms so as ready for discharge Improvement in symptoms so as ready for discharge   Short Term Goals: Ability to identify changes in lifestyle to reduce recurrence of condition will improve Ability to verbalize feelings will improve Ability to demonstrate self-control will improve Ability to identify and develop effective coping behaviors will improve Ability to maintain clinical measurements within normal limits will improve Ability  to identify triggers associated with substance abuse/mental health issues will improve Ability to identify changes in lifestyle to reduce recurrence of condition will improve Ability to disclose and discuss  suicidal ideas Ability to demonstrate self-control will improve Ability to identify and develop effective coping behaviors will improve Ability to maintain clinical measurements within normal limits will improve Ability to identify triggers associated with substance abuse/mental health issues will improve     Medication Management: Evaluate patient's response, side effects, and tolerance of medication regimen.  Therapeutic Interventions: 1 to 1 sessions, Unit Group sessions and Medication administration.  Evaluation of Outcomes: Not Met   RN Treatment Plan for Primary Diagnosis: MDD (major depressive disorder) Long Term Goal(s): Knowledge of disease and therapeutic regimen to maintain health will improve  Short Term Goals: Ability to participate in decision making will improve, Ability to verbalize feelings will improve, Ability to disclose and discuss suicidal ideas, Ability to identify and develop effective coping behaviors will improve and Compliance with prescribed medications will improve  Medication Management: RN will administer medications as ordered by provider, will assess and evaluate patient's response and provide education to patient for prescribed medication. RN will report any adverse and/or side effects to prescribing provider.  Therapeutic Interventions: 1 on 1 counseling sessions, Psychoeducation, Medication administration, Evaluate responses to treatment, Monitor vital signs and CBGs as ordered, Perform/monitor CIWA, COWS, AIMS and Fall Risk screenings as ordered, Perform wound care treatments as ordered.  Evaluation of Outcomes: Not Met   LCSW Treatment Plan for Primary Diagnosis: MDD (major depressive disorder) Long Term Goal(s): Safe transition to appropriate next level of care at discharge, Engage patient in therapeutic group addressing interpersonal concerns.  Short Term Goals: Engage patient in aftercare planning with referrals and resources, Increase social  support, Increase ability to appropriately verbalize feelings, Increase emotional regulation, Facilitate acceptance of mental health diagnosis and concerns and Increase skills for wellness and recovery  Therapeutic Interventions: Assess for all discharge needs, 1 to 1 time with Social worker, Explore available resources and support systems, Assess for adequacy in community support network, Educate family and significant other(s) on suicide prevention, Complete Psychosocial Assessment, Interpersonal group therapy.  Evaluation of Outcomes: Not Met   Progress in Treatment: Attending groups: No. Participating in groups: No. Taking medication as prescribed: Yes. Toleration medication: Yes. Family/Significant other contact made: Yes, individual(s) contacted:  SPE completed wiht pt, pt declined collateral with family. Patient understands diagnosis: Yes. Discussing patient identified problems/goals with staff: Yes. Medical problems stabilized or resolved: Yes. Denies suicidal/homicidal ideation: Yes. Issues/concerns per patient self-inventory: No. Other: none  New problem(s) identified: No, Describe:  none  New Short Term/Long Term Goal(s): detox, elimination of symptoms of psychosis, medication management for mood stabilization; elimination of SI thoughts; development of comprehensive mental wellness/sobriety plan.   Patient Goals:  "get back on my medications"  Discharge Plan or Barriers: Patient reports plans to begin outpatient with [facility].  Patient reports plans to go to his home with his mother.  Reason for Continuation of Hospitalization: Depression Medication stabilization Suicidal ideation  Estimated Length of Stay: 1-7 days  Attendees: Patient: Brett Sanford 04/15/2020 2:15 PM  Physician: Dr. Mallie Darting, MD 04/15/2020 2:15 PM  Nursing:  04/15/2020 2:15 PM  RN Care Manager: 04/15/2020 2:15 PM  Social Worker: Assunta Curtis, [place] 04/15/2020 2:15 PM  Recreational Therapist:  04/15/2020 2:15  PM  Other: Audree Camel, LCSW 04/15/2020 2:15 PM  Other:  04/15/2020 2:15 PM  Other: 04/15/2020 2:15 PM    Scribe for Treatment Team:  Rozann Lesches, LCSW 04/15/2020 2:15 PM

## 2020-04-15 NOTE — Plan of Care (Signed)
Patient stayed in the milieu and remained cooperative. Became restless and anxious toward bedtime and requested medication. Vistaril 25mg  given per provider's recommendation. Had no additional concern. Emotional support provided. Currently in bed and safety precautions maintained.

## 2020-04-15 NOTE — Progress Notes (Signed)
Patient ID: Brett Sanford, male   DOB: [birth year], 28 y.o.   MRN: [id number] D: Pt brought here voluntarily as walk-in with his mother. Pt denies HI/AVH and pain at this time. Pt endorses passive SI with plan to either hang himself, cut his throat or take an overdose. Pt verbally contracts for safety. Pt endorses feeling depressed and anxious. Since he was last an inpatient at [facility] in 09/2019, "I have gotten a job and I live with my mother. But things have been going downhill. It's one thing after another." Pt doesn't elaborate. States that his relationship with his mother is "alright."  His mother is his primary social support. When asked about friends, pt says he doesn't have any right now that he can call a support system. 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 Pt has been off of his medications for the past 6 months. Pt states that he wants to work on: staying calm and getting back on his medication.   Pt endorses weekly marijuana use and smoking 1-2 packs of cigarettes a day (for past 5 years). Pt also states that a year ago he cut back on his alcohol. "I used to drink a pint every couple days. Now I have a few shots of liquor 5-6 days a week." Pt cut back on his drinking when he examined his lab work on [employer] and came to understand that the elevated numbers meant he was headed for liver disease.  A: Pt was offered support and encouragement. Pt is cooperative during assessment. VS assessed and admission paperwork signed. Belongings searched and contraband items placed in locker. Non-invasive skin search completed: pt has abrasion on right foot and left flank. Pt also has a scar of the left knee and tattoo on the left shoulder. Pt offered food and drink and drink accepted. Pt introduced to unit milieu by nursing staff. Q 15 minute checks were started for safety.   R: Pt in room. Pt safety maintained on unit.

## 2020-04-15 NOTE — Progress Notes (Signed)
The patient shared with the group that he met his new peers as today was his first day on the hallway. His goal for tomorrow is to speak with the doctor.

## 2020-04-16 MED ORDER — VENLAFAXINE HCL ER 75 MG PO CP24
75.0000 mg | ORAL_CAPSULE | Freq: Every day | ORAL | Status: DC
  Administered 2020-04-17 – 2020-04-18 (×2): 75 mg via ORAL
  Filled 2020-04-16: qty 7
  Filled 2020-04-16 (×4): qty 1

## 2020-04-16 NOTE — Progress Notes (Signed)
   04/16/20 2300  Psych Admission Type (Psych Patients Only)  Admission Status Voluntary  Psychosocial Assessment  Patient Complaints Anxiety  Eye Contact Fair  Facial Expression Anxious;Pensive  Affect Anxious;Depressed;Sad;Sullen  Furniture conservator/restorer  Appearance/Hygiene Disheveled  Behavior Characteristics Cooperative  Mood Depressed  Thought Process  Coherency WDL  Content Blaming self  Delusions None reported or observed  Perception WDL  Hallucination None reported or observed  Judgment Poor  Confusion None  Danger to Self  Current suicidal ideation? Denies  Danger to Others  Danger to Others None reported or observed

## 2020-04-16 NOTE — Plan of Care (Signed)
Progress note  D: tp found in bed; compliant with medication administration. Pt denies any physical complaints or pain. Pt is sad/sullen on approach. Pt denies any detox symptoms at this time. Pt still expresses concerns with stressors in their life, but is vague with these stressors. Pt is pleasant though. Pt denies si/hi/ah/vh and verbally agrees to approach staff if these become apparent or before harming themself/others while at [facility].  A: Pt provided support and encouragement. Pt given medication per protocol and standing orders. Q48m safety checks implemented and continued.  R: Pt safe on the unit. Will continue to monitor.  Pt progressing in the following metrics  Problem: Education: Goal: Knowledge of [facility] Education information/materials will improve Outcome: Progressing   Problem: Coping: Goal: Coping ability will improve Outcome: Progressing   Problem: Medication: Goal: Compliance with prescribed medication regimen will improve Outcome: Progressing   Problem: Self-Concept: Goal: Ability to disclose and discuss suicidal ideas will improve Outcome: Progressing

## 2020-04-16 NOTE — Progress Notes (Signed)
[facility] MD Progress Note  04/16/2020 3:12 PM Brett Sanford  MRN:  [id number] Subjective:  He reports he is doing " a little better" today. Currently denies suicidal ideations. Denies medication side effects at this time. Objective : I have discussed case with treatment team and have met with patient. 29 year old male, lives with mother, employed . Presented for worsening depression, anxiety, suicidal ideations, neuro-vegetative symptoms of depression. He has a history of alcohol use disorder and reports he had been drinking regularly but significantly less amounts than before. History of prior psychiatric admission for depression, suicidal attempt, alcohol dependence in January 2021.  Today patient reports he is feeling better than he did prior to admission- currently denies suicidal ideations and presents future oriented, hoping for discharge soon. Denies significant symptoms of alcohol WDL and does not appear to be in any acute distress or discomfort . No tremors or diaphoresis noted . Vitals are stable ( 128/79, pulse 80)  Denies medication side effects- currently on Effexor XR , which is a new antidepressant trial for him. No disruptive or agitated behaviors on unit, going to some groups , pleasant on approach. EKG NSR, QTc 434.  CMP unremarkable ( total Bilirubin slightly increased 1.6) . Lipid panel- cholesterol 258, Triglycerides 244.  Principal Problem: MDD (major depressive disorder) Diagnosis: Principal Problem:   MDD (major depressive disorder)  Total Time spent with patient: 20 minutes  Past Psychiatric History:   Past Medical History:  Past Medical History:  Diagnosis Date  . ADD (attention deficit disorder)   . Drug abuse ([place])   . Medical history non-contributory     Past Surgical History:  Procedure Laterality Date  . NO PAST SURGERIES     Family History:  Family History  Family history unknown: Yes   Family Psychiatric  History:  Social History:  Social History    Substance and Sexual Activity  Alcohol Use Yes   Comment: a few shots/5-6 days/week     Social History   Substance and Sexual Activity  Drug Use Yes  . Types: Marijuana   Comment: once a week    Social History   Socioeconomic History  . Marital status: Single    Spouse name: Not on file  . Number of children: Not on file  . Years of education: Not on file  . Highest education level: Not on file  Occupational History  . Not on file  Tobacco Use  . Smoking status: Current Every Day Smoker    Packs/day: 1.50    Years: 5.00    Pack years: 7.50  . Smokeless tobacco: Never Used  . Tobacco comment: 1-2 packs per day/5 years  Substance and Sexual Activity  . Alcohol use: Yes    Comment: a few shots/5-6 days/week  . Drug use: Yes    Types: Marijuana    Comment: once a week  . Sexual activity: Not on file  Other Topics Concern  . Not on file  Social History Narrative   Pt employed and living with mother who he identifies as his main social support. Pt has not taken his meds for the last 6 months. Pt has trouble getting medications because he does not have insurance.   Social Determinants of Health   Financial Resource Strain:   . Difficulty of Paying Living Expenses:   Food Insecurity:   . Worried About Charity fundraiser in the Last Year:   . [place] in the Last Year:  Transportation Needs:   . Film/video editor (Medical):   [name] Lack of Transportation (Non-Medical):   Physical Activity:   . Days of Exercise per Week:   . Minutes of Exercise per Session:   Stress:   . Feeling of Stress :   Social Connections:   . Frequency of Communication with Friends and Family:   . Frequency of Social Gatherings with Friends and Family:   . Attends Religious Services:   . Active Member of Clubs or Organizations:   . Attends Archivist Meetings:   [name] Marital Status:    Additional Social History:   Sleep: Good  Appetite:  Good  Current  Medications: Current Facility-Administered Medications  Medication Dose Route Frequency Provider Last Rate Last Admin  . acetaminophen (TYLENOL) tablet 650 mg  650 mg Oral Q6H PRN Anike, Adaku C, NP      . loperamide (IMODIUM) capsule 2-4 mg  2-4 mg Oral PRN Latoiya Maradiaga, Myer Peer, MD      . LORazepam (ATIVAN) tablet 1 mg  1 mg Oral Q6H PRN Ashima Shrake, Myer Peer, MD      . magnesium hydroxide (MILK OF MAGNESIA) suspension 30 mL  30 mL Oral Daily PRN Anike, Adaku C, NP      . multivitamin with minerals tablet 1 tablet  1 tablet Oral Daily Myrle Wanek, Myer Peer, MD   1 tablet at 04/16/20 0729  . nicotine (NICODERM CQ - dosed in mg/24 hours) patch 21 mg  21 mg Transdermal Daily Anike, Adaku C, NP   21 mg at 04/16/20 0728  . thiamine tablet 100 mg  100 mg Oral Daily Jermanie Minshall, Myer Peer, MD   100 mg at 04/16/20 0729  . traZODone (DESYREL) tablet 50 mg  50 mg Oral QHS PRN Anike, Adaku C, NP   50 mg at 04/15/20 2113  . venlafaxine XR (EFFEXOR-XR) 24 hr capsule 37.5 mg  37.5 mg Oral Q breakfast Aiyah Scarpelli, Myer Peer, MD   37.5 mg at 04/16/20 8242    Lab Results:  Results for orders placed or performed during the hospital encounter of 04/15/20 (from the past 48 hour(s))  Urinalysis, Complete w Microscopic     Status: Abnormal   Collection Time: 04/15/20 12:20 PM  Result Value Ref Range   Color, Urine YELLOW YELLOW   APPearance CLOUDY (A) CLEAR   Specific Gravity, Urine 1.023 1.005 - 1.030   pH 6.0 5.0 - 8.0   Glucose, UA NEGATIVE NEGATIVE mg/dL   Hgb urine dipstick NEGATIVE NEGATIVE   Bilirubin Urine NEGATIVE NEGATIVE   Ketones, ur NEGATIVE NEGATIVE mg/dL   Protein, ur NEGATIVE NEGATIVE mg/dL   Nitrite NEGATIVE NEGATIVE   Leukocytes,Ua NEGATIVE NEGATIVE   WBC, UA 0-5 0 - 5 WBC/hpf   Bacteria, UA RARE (A) NONE SEEN    Comment: Performed at [facility], [place] [address], [place], [place] [address]  Urine rapid drug screen (hosp performed)not at [facility]     Status: Abnormal   Collection Time:  04/15/20 12:20 PM  Result Value Ref Range   Opiates NONE DETECTED NONE DETECTED   Cocaine NONE DETECTED NONE DETECTED   Benzodiazepines NONE DETECTED NONE DETECTED   Amphetamines NONE DETECTED NONE DETECTED   Tetrahydrocannabinol POSITIVE (A) NONE DETECTED   Barbiturates NONE DETECTED NONE DETECTED    Comment: (NOTE) DRUG SCREEN FOR MEDICAL PURPOSES ONLY.  IF CONFIRMATION IS NEEDED FOR ANY PURPOSE, NOTIFY LAB WITHIN 5 DAYS.  LOWEST DETECTABLE LIMITS FOR URINE DRUG SCREEN Drug Class  Cutoff (ng/mL) Amphetamine and metabolites    1000 Barbiturate and metabolites    200 Benzodiazepine                 161 Tricyclics and metabolites     300 Opiates and metabolites        300 Cocaine and metabolites        300 THC                            50 Performed at [facility], [place] [address] [place], [place] [address]   Comprehensive metabolic panel     Status: Abnormal   Collection Time: 04/15/20  5:58 PM  Result Value Ref Range   Sodium 138 135 - 145 mmol/L   Potassium 3.9 3.5 - 5.1 mmol/L   Chloride 103 98 - 111 mmol/L   CO2 25 22 - 32 mmol/L   Glucose, Bld 99 70 - 99 mg/dL    Comment: Glucose reference range applies only to samples taken after fasting for at least 8 hours.   BUN 19 6 - 20 mg/dL   Creatinine, Ser 1.07 0.61 - 1.24 mg/dL   Calcium 9.0 8.9 - 10.3 mg/dL   Total Protein 7.6 6.5 - 8.1 g/dL   Albumin 4.5 3.5 - 5.0 g/dL   AST 22 15 - 41 U/L   ALT 23 0 - 44 U/L   Alkaline Phosphatase 56 38 - 126 U/L   Total Bilirubin 1.6 (H) 0.3 - 1.2 mg/dL   GFR calc non Af Amer >60 >60 mL/min   GFR calc Af Amer >60 >60 mL/min   Anion gap 10 5 - 15    Comment: Performed at [facility], [place] [address], [place], [place] [address]  Hemoglobin A1c     Status: None   Collection Time: 04/15/20  5:58 PM  Result Value Ref Range   Hgb A1c MFr Bld 5.0 4.8 - 5.6 %    Comment: (NOTE) Pre diabetes:          5.7%-6.4%  Diabetes:               >6.4%  Glycemic control for   <7.0% adults with diabetes    Mean Plasma Glucose 96.8 mg/dL    Comment: Performed at [place] [address], [place], [place] [address]  Magnesium     Status: None   Collection Time: 04/15/20  5:58 PM  Result Value Ref Range   Magnesium 2.2 1.7 - 2.4 mg/dL    Comment: Performed at [facility], [place] [address] [place], [place] [address]  Ethanol     Status: None   Collection Time: 04/15/20  5:58 PM  Result Value Ref Range   Alcohol, Ethyl (B) <10 <10 mg/dL    Comment: (NOTE) Lowest detectable limit for serum alcohol is 10 mg/dL.  For medical purposes only. Performed at [facility], [place] [address] [place],  [address]   Lipid panel     Status: Abnormal   Collection Time: 04/15/20  5:58 PM  Result Value Ref Range   Cholesterol 258 (H) 0 - 200 mg/dL   Triglycerides 244 (H) <150 mg/dL   HDL 85 >40 mg/dL   Total CHOL/HDL Ratio 3.0 RATIO   VLDL 49 (H) 0 - 40 mg/dL   LDL Cholesterol NOT CALCULATED 0 - 99 mg/dL    Comment: Performed at [facility], [place] [address], [place], [place] [address]  Hepatic function panel     Status: Abnormal   Collection Time: 04/15/20  5:58 PM  Result Value Ref Range   Total Protein 7.3 6.5 - 8.1 g/dL   Albumin 4.3 3.5 - 5.0 g/dL   AST 23 15 - 41 U/L   ALT 22 0 - 44 U/L   Alkaline Phosphatase 55 38 - 126 U/L   Total Bilirubin 1.9 (H) 0.3 - 1.2 mg/dL   Bilirubin, Direct 0.2 0.0 - 0.2 mg/dL   Indirect Bilirubin 1.7 (H) 0.3 - 0.9 mg/dL    Comment: Performed at [facility], [place] [address] [place], [place] [address]  TSH     Status: None   Collection Time: 04/15/20  5:58 PM  Result Value Ref Range   TSH 2.832 0.350 - 4.500 uIU/mL    Comment: Performed by a 3rd Generation assay with a functional sensitivity of <=0.01 uIU/mL. Performed at [facility], [place] [address], [place], [place] [address]      Blood Alcohol level:  Lab Results  Component Value Date   ETH <10 04/15/2020   ETH 272 (H) 40/81/4481    Metabolic Disorder Labs: Lab Results  Component Value Date   HGBA1C 5.0 04/15/2020   MPG 96.8 04/15/2020   MPG 79.58 10/11/2019   No results found for: PROLACTIN Lab Results  Component Value Date   CHOL 258 (H) 04/15/2020   TRIG 244 (H) 04/15/2020   HDL 85 04/15/2020   CHOLHDL 3.0 04/15/2020   VLDL 49 (H) 04/15/2020   LDLCALC NOT CALCULATED 04/15/2020   LDLCALC 115 (H) 10/11/2019    Physical Findings: AIMS:  , ,  ,  ,    CIWA:  CIWA-Ar Total: 0 COWS:     Musculoskeletal: Strength & Muscle Tone: within normal limits no tremors, no diaphoresis, no restlessness or agitation Gait & Station: normal Patient leans: N/A  Psychiatric Specialty Exam: Physical Exam  Review of Systems no headache, no chest pain, no shortness of breath, no nausea or vomiting  Blood pressure 128/79, pulse 80, temperature 97.8 F (36.6 C), temperature source Oral, resp. rate 16, height '5\' 8"'  (1.727 m), weight 98.9 kg, SpO2 99 %.Body mass index is 33.15 kg/m.  General Appearance: Fairly Groomed  Eye Contact:  Good  Speech:  Normal Rate  Volume:  Normal  Mood:  improving mood , reports he is feeling better  Affect:  more reactive, smiles at times appropriately during session  Thought Process:  Linear and Descriptions of Associations: Intact  Orientation:  Full (Time, Place, and Person)  Thought Content:  no hallucinations, no delusions, not internally preoccupied   Suicidal Thoughts:  No denies suicidal or self injurious ideations , denies homicidal or violent ideations   Homicidal Thoughts:  No  Memory:  recent and remote grossly intact   Judgement:  Fair/ improving  Insight:  Fair  Psychomotor Activity:  Normal  Concentration:  Concentration: Good and Attention Span: Good  Recall:  Good  Fund of Knowledge:  Good  Language:  Good  Akathisia:  Negative  Handed:  Right  AIMS (if  indicated):     Assets:  Communication Skills Desire for Improvement Resilience  ADL's:  Intact  Cognition:  WNL  Sleep:  Number of Hours: 5.75   Assessment-  29 year old male, lives with mother, employed . Presented for worsening depression, anxiety, suicidal ideations, neuro-vegetative symptoms of depression. He has a history of alcohol use  disorder and reports he had been drinking regularly but significantly less amounts than before. History of prior psychiatric admission for depression, suicidal attempt, alcohol dependence in January 2021.  Currently patient reports feeling better and denies SI. He presents future oriented at this time.  He is not presenting with significant symptoms of alcohol WDL and vitals are stable.  Thus far tolerating Effexor XR trial well .     Treatment Plan Summary: Daily contact with patient to assess and evaluate symptoms and progress in treatment, Medication management, Plan inpatient treatment  and medications as below Treatment team working on disposition planning options Encourage efforts to work on sobriety and relapse prevention Encourage group participation Increase Effexor XR to 75 mgrs QDAY for depression Continue Ativan 1 mgr Q 6 hours PRN for alcohol WDL as needed, continue Thiamine and MVI supplementation Continue Trazodone 50 mgrs QHS PRN for insomnia  Jenne Campus, MD 04/16/2020, 3:12 PM

## 2020-04-16 NOTE — Progress Notes (Addendum)
[facility]  04/16/2020 2:27 PM Brett GaribaldiSeth P Sanford  MRN:  [id number] Subjective:  Patient sated his mood is good toady which he explained further that he does not feel like he is in a dark hole.When asked to put it on the scale of 1 to 10 about depression, he said when he came to hospital it was about 9 and today it is at 3/4. He stated he had difficulty sleeping last night but after taking Vistaril for his anxiety he slept well and woke up fresh in the morning. He denied any suicidal ideation. He also denied any AVH or HI. 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 He talked about his suicidal ideation and explained he was out of his medications because of no insurance. He added he lives with his mother and living with family is stressful along with his drinking 5-6 shots daily at night. He said he smokes marijuana 3-4 times/week to unwind himself and to go to sleep. He stated he does not sleep well at home. He ate his breakfast and stated his appetite is good. He mentioned that he is a social person and he tries to talk to everyone in the unit. Also, he plans to limit his alcohol intake on discharge, if he will feel the need, he will just go night fishing which is his favorite hobby or will try to read a book.  Objective: Patient is see and examined. His mood seemed fine, had good eye contact and was a good historian. He had good insight about his diagnosis and treatment. He looked casual, active, alert and concentrated. He slept 5.75 hours and showed no self-injurious behavior. No withdrawal signs were observed. No new labs today. Vitals are normal. Principal Problem: MDD (major depressive disorder) Diagnosis: Principal Problem:   MDD (major depressive disorder)  Total Time spent with patient: 20 minutes  Past Psychiatric History: See H & P  Past Medical History:  Past Medical History:  Diagnosis Date  . ADD (attention deficit disorder)   . Drug  abuse (HCC)   . Medical history non-contributory     Past Surgical History:  Procedure Laterality Date  . NO PAST SURGERIES     Family History:  Family History  Family history unknown: Yes   Family Psychiatric  History: See H & P Social History:  Social History   Substance and Sexual Activity  Alcohol Use Yes   Comment: a few shots/5-6 days/week     Social History   Substance and Sexual Activity  Drug Use Yes  . Types: Marijuana   Comment: once a week    Social History   Socioeconomic History  . Marital status: Single    Spouse name: Not on file  . Number of children: Not on file  . Years of education: Not on file  . Highest education level: Not on file  Occupational History  . Not on file  Tobacco Use  . Smoking status: Current Every Day Smoker    Packs/day: 1.50    Years: 5.00    Pack years: 7.50  . Smokeless tobacco: Never Used  . Tobacco comment: 1-2 packs per day/5 years  Substance and Sexual Activity  . Alcohol use: Yes    Comment: a few shots/5-6 days/week  . Drug use: Yes    Types: Marijuana    Comment: once a week  . Sexual activity: Not on file  Other Topics Concern  .  Not on file  Social History Narrative   Pt employed and living with mother who he identifies as his main social support. Pt has not taken his meds for the last 6 months. Pt has trouble getting medications because he does not have insurance.   Social Determinants of Health   Financial Resource Strain:   . Difficulty of Paying Living Expenses:   Food Insecurity:   . Worried About Programme researcher, broadcasting/film/video in the Last Year:   . Barista in the Last Year:   Transportation Needs:   . Freight forwarder (Medical):   [name] Lack of Transportation (Non-Medical):   Physical Activity:   . Days of Exercise per Week:   . Minutes of Exercise per Session:   Stress:   . Feeling of Stress :   Social Connections:   . Frequency of Communication with Friends and Family:   . Frequency of  Social Gatherings with Friends and Family:   . Attends Religious Services:   . Active Member of Clubs or Organizations:   . Attends Banker Meetings:   [name] Marital Status:    Additional Social History:                         Sleep: Good  Appetite:  Good  Current Medications: Current Facility-Administered Medications  Medication Dose Route Frequency Provider Last Rate Last Admin  . acetaminophen (TYLENOL) tablet 650 mg  650 mg Oral Q6H PRN Anike, Adaku C, NP      . loperamide (IMODIUM) capsule 2-4 mg  2-4 mg Oral PRN Cobos, Rockey Situ, MD      . LORazepam (ATIVAN) tablet 1 mg  1 mg Oral Q6H PRN Cobos, Rockey Situ, MD      . magnesium hydroxide (MILK OF MAGNESIA) suspension 30 mL  30 mL Oral Daily PRN Anike, Adaku C, NP      . multivitamin with minerals tablet 1 tablet  1 tablet Oral Daily Cobos, Rockey Situ, MD   1 tablet at 04/16/20 0729  . nicotine (NICODERM CQ - dosed in mg/24 hours) patch 21 mg  21 mg Transdermal Daily Anike, Adaku C, NP   21 mg at 04/16/20 0728  . thiamine tablet 100 mg  100 mg Oral Daily Cobos, Rockey Situ, MD   100 mg at 04/16/20 0729  . traZODone (DESYREL) tablet 50 mg  50 mg Oral QHS PRN Anike, Adaku C, NP   50 mg at 04/15/20 2113  . venlafaxine XR (EFFEXOR-XR) 24 hr capsule 37.5 mg  37.5 mg Oral Q breakfast Cobos, Rockey Situ, MD   37.5 mg at 04/16/20 3664    Lab Results:  Results for orders placed or performed during the hospital encounter of 04/15/20 (from the past 48 hour(s))  Urinalysis, Complete w Microscopic     Status: Abnormal   Collection Time: 04/15/20 12:20 PM  Result Value Ref Range   Color, Urine YELLOW YELLOW   APPearance CLOUDY (A) CLEAR   Specific Gravity, Urine 1.023 1.005 - 1.030   pH 6.0 5.0 - 8.0   Glucose, UA NEGATIVE NEGATIVE mg/dL   Hgb urine dipstick NEGATIVE NEGATIVE   Bilirubin Urine NEGATIVE NEGATIVE   Ketones, ur NEGATIVE NEGATIVE mg/dL   Protein, ur NEGATIVE NEGATIVE mg/dL   Nitrite NEGATIVE NEGATIVE    Leukocytes,Ua NEGATIVE NEGATIVE   WBC, UA 0-5 0 - 5 WBC/hpf   Bacteria, UA RARE (A) NONE SEEN    Comment: Performed at  [facility], [address], [place], [place] [address]  Urine rapid drug screen (hosp performed)not at [facility]     Status: Abnormal   Collection Time: 04/15/20 12:20 PM  Result Value Ref Range   Opiates NONE DETECTED NONE DETECTED   Cocaine NONE DETECTED NONE DETECTED   Benzodiazepines NONE DETECTED NONE DETECTED   Amphetamines NONE DETECTED NONE DETECTED   Tetrahydrocannabinol POSITIVE (A) NONE DETECTED   Barbiturates NONE DETECTED NONE DETECTED    Comment: (NOTE) DRUG SCREEN FOR MEDICAL PURPOSES ONLY.  IF CONFIRMATION IS NEEDED FOR ANY PURPOSE, NOTIFY LAB WITHIN 5 DAYS.  LOWEST DETECTABLE LIMITS FOR URINE DRUG SCREEN Drug Class                     Cutoff (ng/mL) Amphetamine and metabolites    1000 Barbiturate and metabolites    200 Benzodiazepine                 200 Tricyclics and metabolites     300 Opiates and metabolites        300 Cocaine and metabolites        300 THC                            50 Performed at [facility], [address], [place], [place] [address]   Comprehensive metabolic panel     Status: Abnormal   Collection Time: 04/15/20  5:58 PM  Result Value Ref Range   Sodium 138 135 - 145 mmol/L   Potassium 3.9 3.5 - 5.1 mmol/L   Chloride 103 98 - 111 mmol/L   CO2 25 22 - 32 mmol/L   Glucose, Bld 99 70 - 99 mg/dL    Comment: Glucose reference range applies only to samples taken after fasting for at least 8 hours.   BUN 19 6 - 20 mg/dL   Creatinine, Ser 1.47 0.61 - 1.24 mg/dL   Calcium 9.0 8.9 - 82.9 mg/dL   Total Protein 7.6 6.5 - 8.1 g/dL   Albumin 4.5 3.5 - 5.0 g/dL   AST 22 15 - 41 U/L   ALT 23 0 - 44 U/L   Alkaline Phosphatase 56 38 - 126 U/L   Total Bilirubin 1.6 (H) 0.3 - 1.2 mg/dL   GFR calc non Af Amer >60 >60 mL/min   GFR calc Af Amer >60 >60 mL/min   Anion gap 10 5 - 15    Comment:  Performed at [facility], [address] [place], [place] [address]  Hemoglobin A1c     Status: None   Collection Time: 04/15/20  5:58 PM  Result Value Ref Range   Hgb A1c MFr Bld 5.0 4.8 - 5.6 %    Comment: (NOTE) Pre diabetes:          5.7%-6.4%  Diabetes:              >6.4%  Glycemic control for   <7.0% adults with diabetes    Mean Plasma Glucose 96.8 mg/dL    Comment: Performed at [facility], [address], [place], [place] [address]  Magnesium     Status: None   Collection Time: 04/15/20  5:58 PM  Result Value Ref Range   Magnesium 2.2 1.7 - 2.4 mg/dL    Comment: Performed at [facility], [address], [place], [place] [address]  Ethanol     Status:  None   Collection Time: 04/15/20  5:58 PM  Result Value Ref Range   Alcohol, Ethyl (B) <10 <10 mg/dL    Comment: (NOTE) Lowest detectable limit for serum alcohol is 10 mg/dL.  For medical purposes only. Performed at [facility], [address], [place], [place] [address]   Lipid panel     Status: Abnormal   Collection Time: 04/15/20  5:58 PM  Result Value Ref Range   Cholesterol 258 (H) 0 - 200 mg/dL   Triglycerides 262 (H) <150 mg/dL   HDL 85 >03 mg/dL   Total CHOL/HDL Ratio 3.0 RATIO   VLDL 49 (H) 0 - 40 mg/dL   LDL Cholesterol NOT CALCULATED 0 - 99 mg/dL    Comment: Performed at [facility], [address] [place], [place] [address]  Hepatic function panel     Status: Abnormal   Collection Time: 04/15/20  5:58 PM  Result Value Ref Range   Total Protein 7.3 6.5 - 8.1 g/dL   Albumin 4.3 3.5 - 5.0 g/dL   AST 23 15 - 41 U/L   ALT 22 0 - 44 U/L   Alkaline Phosphatase 55 38 - 126 U/L   Total Bilirubin 1.9 (H) 0.3 - 1.2 mg/dL   Bilirubin, Direct 0.2 0.0 - 0.2 mg/dL   Indirect Bilirubin 1.7 (H) 0.3 - 0.9 mg/dL    Comment: Performed at [facility], [address] [place], [place] [address]  TSH     Status:  None   Collection Time: 04/15/20  5:58 PM  Result Value Ref Range   TSH 2.832 0.350 - 4.500 uIU/mL    Comment: Performed by a 3rd Generation assay with a functional sensitivity of <=0.01 uIU/mL. Performed at [facility], [address], [place], [place] [address]     Blood Alcohol level:  Lab Results  Component Value Date   ETH <10 04/15/2020   ETH 272 (H) 10/06/2019    Metabolic Disorder Labs: Lab Results  Component Value Date   HGBA1C 5.0 04/15/2020   MPG 96.8 04/15/2020   MPG 79.58 10/11/2019   No results found for: PROLACTIN Lab Results  Component Value Date   CHOL 258 (H) 04/15/2020   TRIG 244 (H) 04/15/2020   HDL 85 04/15/2020   CHOLHDL 3.0 04/15/2020   VLDL 49 (H) 04/15/2020   LDLCALC NOT CALCULATED 04/15/2020   LDLCALC 115 (H) 10/11/2019    Physical Findings: AIMS:  , ,  ,  ,    CIWA:  CIWA-Ar Total: 0 COWS:     Musculoskeletal: Strength & Muscle Tone: within normal limits Gait & Station: normal Patient leans: N/A  Psychiatric Specialty Exam: Physical Exam  Review of Systems  Blood pressure 128/79, pulse 80, temperature 97.8 F (36.6 C), temperature source Oral, resp. rate 16, height 5\' 8"  (1.727 m), weight 98.9 kg, SpO2 99 %.Body mass index is 33.15 kg/m.  General Appearance: Casual  Eye Contact:  Good  Speech:  Normal Rate  Volume:  Normal  Mood:  Good  Affect:  Appropriate  Thought Process:  Goal Directed  Orientation:  Full (Time, Place, and Person)  Thought Content:  WDL  Suicidal Thoughts:  No  Homicidal Thoughts:  No  Memory:  Immediate;   Good  Judgement:  Fair  Insight:  Fair  Psychomotor Activity:  NA  Concentration:  Concentration: Good  Recall:  Good  Fund of Knowledge:  Fair  Language:  Good  Akathisia:  NA  Handed:  Right  AIMS (if indicated):     Assets:  Desire for Improvement Social Support  ADL's:  Intact  Cognition:  WNL  Sleep:  Number of Hours: 5.75    Assessment: Patient is a 29 y.o male  who presented to [facility] voluntraily as a walk-in for SI. Pt reports he has been feeling suicidal for last 3 days due to family issues and work stress. He has depressed mood, insomnia, hopelessness.  D/D:  1. Major Depressive Disorder 2. Alcohol dependence 3. Cannabis dependence     Treatment Plan Summary: Daily contact with patient to assess and evaluate symptoms and progress in treatment   Plan:  1. Continue Lorazepam Q6H PRN 2. Continue Trazodone 50 mg  PO QHS PRN 3. Increase Venlafaxine to 75 mg PO daily for depression 4. Disposition in progress.  Arnoldo Lenis, MD 04/16/2020, 2:27 PM

## 2020-04-16 NOTE — Progress Notes (Signed)
Patient slept throughout the night and woke up on time for vital signs. No withdrawal symptoms noted.

## 2020-04-17 NOTE — Plan of Care (Signed)
Progress note  D: pt found in bed; compliant with medication administration. Pt presents animated and appropriate to circumstance on approach. Pt denies any physical complaints or pain. Pt voices readiness for discharge. Pt is pleasant. Pt has been viewed in the dayroom interacting appropriately with peers. Pt denies si/hi/ah/vh and verbally agrees to approach staff if these become apparent or before harming themself/others while at [facility].  A: Pt provided support and encouragement. Pt given medication per protocol and standing orders. Q36m safety checks implemented and continued.  R: Pt safe on the unit. Will continue to monitor.  Pt progressing in the following metrics  Problem: Health Behavior/Discharge Planning: Goal: Identification of resources available to assist in meeting health care needs will improve Outcome: Progressing   Problem: Medication: Goal: Compliance with prescribed medication regimen will improve Outcome: Progressing   Problem: Self-Concept: Goal: Ability to disclose and discuss suicidal ideas will improve Outcome: Progressing Goal: Will verbalize positive feelings about self Outcome: Progressing

## 2020-04-17 NOTE — Progress Notes (Signed)
[facility] MD Progress Note  04/17/2020 1:44 PM Brett Sanford  MRN:  [id number] Subjective:  Patients states his mood is good. On the scale of 1 to 10 for depression, his mood is at 2. He added he had difficulty going to sleep but he was given Trazodone and that helped him to get some sleep. He denied any suicidal ideation, homicidal ideation or any AVH. He states he does not have any side effects from his medications. He complained of loose bowel movements but stated that this is normal for him. He states he has a plan after discharge. He is going to go live his mother, start his job back at [employer] and will stay clear from alcohol and marijuana.He talked about his possible discharge.  Objective: Patient is seen and examined. His mood looks good. He was alert, oriented and cooperative. He had a fair insight about his diagnosis and treatment. He talked about his medications. No withdrawal symptoms were observed. No new labs today. His blood pressure is little high, apart from that his vitals are stable.   Principal Problem: MDD (major depressive disorder) Diagnosis: Principal Problem:   MDD (major depressive disorder)  Total Time spent with patient: 20 minutes  Past Psychiatric History: See H & P  Past Medical History:  Past Medical History:  Diagnosis Date  . ADD (attention deficit disorder)   . Drug abuse (HCC)   . Medical history non-contributory     Past Surgical History:  Procedure Laterality Date  . NO PAST SURGERIES     Family History:  Family History  Family history unknown: Yes   Family Psychiatric  History: See H & P Social History:  Social History   Substance and Sexual Activity  Alcohol Use Yes   Comment: a few shots/5-6 days/week     Social History   Substance and Sexual Activity  Drug Use Yes  . Types: Marijuana   Comment: once a week    Social History   Socioeconomic History  . Marital status: Single    Spouse name: Not on file  . Number of children: Not  on file  . Years of education: Not on file  . Highest education level: Not on file  Occupational History  . Not on file  Tobacco Use  . Smoking status: Current Every Day Smoker    Packs/day: 1.50    Years: 5.00    Pack years: 7.50  . Smokeless tobacco: Never Used  . Tobacco comment: 1-2 packs per day/5 years  Substance and Sexual Activity  . Alcohol use: Yes    Comment: a few shots/5-6 days/week  . Drug use: Yes    Types: Marijuana    Comment: once a week  . Sexual activity: Not on file  Other Topics Concern  . Not on file  Social History Narrative   Pt employed and living with mother who he identifies as his main social support. Pt has not taken his meds for the last 6 months. Pt has trouble getting medications because he does not have insurance.   Social Determinants of Health   Financial Resource Strain:   . Difficulty of Paying Living Expenses:   Food Insecurity:   . Worried About Programme researcher, broadcasting/film/video in the Last Year:   . Barista in the Last Year:   Transportation Needs:   . Freight forwarder (Medical):   [name] Lack of Transportation (Non-Medical):   Physical Activity:   . Days of Exercise per Week:   .  Minutes of Exercise per Session:   Stress:   . Feeling of Stress :   Social Connections:   . Frequency of Communication with Friends and Family:   . Frequency of Social Gatherings with Friends and Family:   . Attends Religious Services:   . Active Member of Clubs or Organizations:   . Attends Banker Meetings:   [name] Marital Status:    Additional Social History:                         Sleep: Good  Appetite:  Good  Current Medications: Current Facility-Administered Medications  Medication Dose Route Frequency Provider Last Rate Last Admin  . loperamide (IMODIUM) capsule 2-4 mg  2-4 mg Oral PRN Cobos, Rockey Situ, MD      . LORazepam (ATIVAN) tablet 1 mg  1 mg Oral Q6H PRN Cobos, Rockey Situ, MD      . magnesium hydroxide (MILK  OF MAGNESIA) suspension 30 mL  30 mL Oral Daily PRN Anike, Adaku C, NP      . multivitamin with minerals tablet 1 tablet  1 tablet Oral Daily Cobos, Rockey Situ, MD   1 tablet at 04/17/20 0746  . nicotine (NICODERM CQ - dosed in mg/24 hours) patch 21 mg  21 mg Transdermal Daily Anike, Adaku C, NP   21 mg at 04/17/20 0747  . thiamine tablet 100 mg  100 mg Oral Daily Cobos, Rockey Situ, MD   100 mg at 04/17/20 0747  . traZODone (DESYREL) tablet 50 mg  50 mg Oral QHS PRN Anike, Adaku C, NP   50 mg at 04/16/20 2113  . venlafaxine XR (EFFEXOR-XR) 24 hr capsule 75 mg  75 mg Oral Q breakfast Cobos, Rockey Situ, MD   75 mg at 04/17/20 0747    Lab Results:  Results for orders placed or performed during the hospital encounter of 04/15/20 (from the past 48 hour(s))  Comprehensive metabolic panel     Status: Abnormal   Collection Time: 04/15/20  5:58 PM  Result Value Ref Range   Sodium 138 135 - 145 mmol/L   Potassium 3.9 3.5 - 5.1 mmol/L   Chloride 103 98 - 111 mmol/L   CO2 25 22 - 32 mmol/L   Glucose, Bld 99 70 - 99 mg/dL    Comment: Glucose reference range applies only to samples taken after fasting for at least 8 hours.   BUN 19 6 - 20 mg/dL   Creatinine, Ser 3.24 0.61 - 1.24 mg/dL   Calcium 9.0 8.9 - 40.1 mg/dL   Total Protein 7.6 6.5 - 8.1 g/dL   Albumin 4.5 3.5 - 5.0 g/dL   AST 22 15 - 41 U/L   ALT 23 0 - 44 U/L   Alkaline Phosphatase 56 38 - 126 U/L   Total Bilirubin 1.6 (H) 0.3 - 1.2 mg/dL   GFR calc non Af Amer >60 >60 mL/min   GFR calc Af Amer >60 >60 mL/min   Anion gap 10 5 - 15    Comment: Performed at [facility], [address] [place], [place] [address]  Hemoglobin A1c     Status: None   Collection Time: 04/15/20  5:58 PM  Result Value Ref Range   Hgb A1c MFr Bld 5.0 4.8 - 5.6 %    Comment: (NOTE) Pre diabetes:          5.7%-6.4%  Diabetes:              >  6.4%  Glycemic control for   <7.0% adults with diabetes    Mean Plasma Glucose 96.8 mg/dL     Comment: Performed at [facility], [address] [place], [place] [address]  Magnesium     Status: None   Collection Time: 04/15/20  5:58 PM  Result Value Ref Range   Magnesium 2.2 1.7 - 2.4 mg/dL    Comment: Performed at [facility], [address] [place], [place] [address]  Ethanol     Status: None   Collection Time: 04/15/20  5:58 PM  Result Value Ref Range   Alcohol, Ethyl (B) <10 <10 mg/dL    Comment: (NOTE) Lowest detectable limit for serum alcohol is 10 mg/dL.  For medical purposes only. Performed at [facility], [address], [place], [place] [address]   Lipid panel     Status: Abnormal   Collection Time: 04/15/20  5:58 PM  Result Value Ref Range   Cholesterol 258 (H) 0 - 200 mg/dL   Triglycerides 235 (H) <150 mg/dL   HDL 85 >57 mg/dL   Total CHOL/HDL Ratio 3.0 RATIO   VLDL 49 (H) 0 - 40 mg/dL   LDL Cholesterol NOT CALCULATED 0 - 99 mg/dL    Comment: Performed at [facility], [address] [place], [place] [address]  Hepatic function panel     Status: Abnormal   Collection Time: 04/15/20  5:58 PM  Result Value Ref Range   Total Protein 7.3 6.5 - 8.1 g/dL   Albumin 4.3 3.5 - 5.0 g/dL   AST 23 15 - 41 U/L   ALT 22 0 - 44 U/L   Alkaline Phosphatase 55 38 - 126 U/L   Total Bilirubin 1.9 (H) 0.3 - 1.2 mg/dL   Bilirubin, Direct 0.2 0.0 - 0.2 mg/dL   Indirect Bilirubin 1.7 (H) 0.3 - 0.9 mg/dL    Comment: Performed at [facility], [address], [place], [place] [address]  TSH     Status: None   Collection Time: 04/15/20  5:58 PM  Result Value Ref Range   TSH 2.832 0.350 - 4.500 uIU/mL    Comment: Performed by a 3rd Generation assay with a functional sensitivity of <=0.01 uIU/mL. Performed at [facility], [address] [place], [place] [address]     Blood Alcohol level:  Lab Results  Component Value Date   ETH <10 04/15/2020   ETH 272 (H) 10/06/2019     Metabolic Disorder Labs: Lab Results  Component Value Date   HGBA1C 5.0 04/15/2020   MPG 96.8 04/15/2020   MPG 79.58 10/11/2019   No results found for: PROLACTIN Lab Results  Component Value Date   CHOL 258 (H) 04/15/2020   TRIG 244 (H) 04/15/2020   HDL 85 04/15/2020   CHOLHDL 3.0 04/15/2020   VLDL 49 (H) 04/15/2020   LDLCALC NOT CALCULATED 04/15/2020   LDLCALC 115 (H) 10/11/2019    Physical Findings: AIMS:  , ,  ,  ,    CIWA:  CIWA-Ar Total: 0 COWS:     Musculoskeletal: Strength & Muscle Tone: within normal limits Gait & Station: normal Patient leans: N/A  Psychiatric Specialty Exam: Physical Exam  Review of Systems  Blood pressure (!) 131/93, pulse 83, temperature 97.8 F (36.6 C), temperature source Oral, resp. rate 16, height 5\' 8"  (1.727 m), weight 98.9 kg, SpO2 99 %.Body mass index is 33.15 kg/m.  General Appearance: Casual  Eye Contact:  Good  Speech:  Normal Rate  Volume:  Normal  Mood:  Good  Affect:  Appropriate  Thought Process:  Goal Directed  Orientation:  Full (Time, Place, and Person)  Thought Content:  WDL  Suicidal Thoughts:  No  Homicidal Thoughts:  No  Memory:  Immediate;   Good  Judgement:  Intact  Insight:  Present  Psychomotor Activity:  NA  Concentration:  Concentration: Good  Recall:  Good  Fund of Knowledge:  Fair  Language:  Good  Akathisia:  NA  Handed:  Right  AIMS (if indicated):     Assets:  Desire for Improvement Social Support  ADL's:  Intact  Cognition:  WNL  Sleep:  Number of Hours: 5.75    Assessment: Patient is a 29 y.o male [facility] SI. Pt reported he has been feeling suicidal for last 3 days due to family issues and work stress. He had depressed mood, insomnia, hopelessness.  D/D:  1. Major Depressive Disorder 2. Alcohol dependence 3. Cannabis dependence   Treatment Plan Summary: Daily contact with patient to assess and evaluate symptoms and progress in  treatment.  Plan:  1. Continue Lorazepam Q6H PRN 2. Continue Trazodone 50 mg  PO QHS PRN 3. Continue Venlafaxine  75 mg PO daily for depression 4. Take Loperamide 2-4 mg PRN. 5. Disposition in progress.    Arnoldo LenisAnjali  Triton Heidrich, MD 04/17/2020, 1:44 PM

## 2020-04-18 MED ORDER — NICOTINE 21 MG/24HR TD PT24: 21.0000 mg | MEDICATED_PATCH | Freq: Every day | TRANSDERMAL | 0 refills | Status: AC

## 2020-04-18 MED ORDER — THIAMINE HCL 100 MG PO TABS: 100.0000 mg | ORAL_TABLET | Freq: Every day | ORAL | 0 refills | Status: AC

## 2020-04-18 MED ORDER — VENLAFAXINE HCL ER 75 MG PO CP24: 75.0000 mg | ORAL_CAPSULE | Freq: Every day | ORAL | 0 refills | Status: DC

## 2020-04-18 MED ORDER — TRAZODONE HCL 50 MG PO TABS: 50.0000 mg | ORAL_TABLET | Freq: Every evening | ORAL | 0 refills | Status: DC | PRN

## 2020-04-18 NOTE — Progress Notes (Signed)
Pt denies SI/HI. Pt received both written and verbal discharge instructions. Pt verbalized understanding of discharge instructions. Pt agreed to f/u appt and med regimen. Pt received an AVS, transitional record, SRA, and a prescription for trazodone. All other medications were sent electronically to the pt's pharmacy on file. Pt belongings returned from the locker. Pt safely discharged to the lobby.

## 2020-04-18 NOTE — BHH Suicide Risk Assessment (Addendum)
[facility] Discharge Suicide Risk Assessment   Principal Problem: MDD (major depressive disorder) Discharge Diagnoses: Principal Problem:   MDD (major depressive disorder)   Total Time spent with patient: 30 minutes  Musculoskeletal: Strength & Muscle Tone: within normal limits no tremors, no psychomotor agitation or restlessness, no diaphoresis Gait & Station: normal Patient leans: N/A  Psychiatric Specialty Exam: Review of Systems no headache, no visual disturbances, no chest pain, no shortness of breath, no vomiting  Blood pressure (!) 144/84, pulse 96, temperature 97.8 F (36.6 C), temperature source Oral, resp. rate 16, height 5\' 8"  (1.727 m), weight 98.9 kg, SpO2 99 %.Body mass index is 33.15 kg/m.  Manual BP 130/92   General Appearance: Casual  Eye Contact::  Good  Speech:  Normal Rate409  Volume:  Normal  Mood:  reports improved mood, states " I am feeling a lot better", describes mood as 7/10 with 10 being best   Affect:  Appropriate and fuller in range   Thought Process:  Linear and Descriptions of Associations: Intact  Orientation:  Full (Time, Place, and Person)  Thought Content:  no hallucinations, no delusions, not internally preoccupied   Suicidal Thoughts:  No  Homicidal Thoughts:  No  Memory:  recent and remote grossly intact  Judgement:  Other:  improving   Insight:  fair- improving   Psychomotor Activity:  Normal  Concentration:  Good  Recall:  Good  Fund of Knowledge:Good  Language: Good  Akathisia:  Negative  Handed:  Right  AIMS (if indicated):     Assets:  Communication Skills Desire for Improvement Resilience  Sleep:  Number of Hours: 6.75  Cognition: WNL  ADL's:  Intact   Mental Status Per Nursing Assessment::   On Admission:  Suicidal ideation indicated by patient  Demographic Factors:  29 year old male, single, no children, lives with mother, employed at 27.  Loss Factors: Reports he was off his psychiatric medications for a  period of time  Historical Factors: History of prior psychiatric admissions, history of depression, history of alcohol use disorder   Risk Reduction Factors:   Employed, Living with another person, especially a relative and Positive coping skills or problem solving skills  Continued Clinical Symptoms:  At this time patient presents alert, attentive, calm, pleasant on approach.  Reports he is feeling better, and at this time presents euthymic, with a full range of affect . Denies SI or HI, denies hallucinations, no delusions expressed and presents future oriented. Interacting appropriately with peers , pleasant on approach. No symptoms of alcohol WDL.  He denies side effects on Effexor XR . BP has been mildly elevated , today 144/84. Denies associated symptoms. We reviewed, and patient is aware that Venlafaxine may be associated with elevated BP. We discussed options. At this time prefers to continue Effexor trial as he feels it is well tolerated and helping ,  and states he will monitor his BP regularly ( has BP cuff at home) and discuss with his outpatient provider if BP continues to trend high.  With his express consent I spoke with his mother on the phone, who corroborates patient is improved . She agrees with discharge today .  Cognitive Features That Contribute To Risk:  No gross cognitive deficits noted upon discharge. Is alert , attentive, and oriented x 3    Suicide Risk:  Mild:  Suicidal ideation of limited frequency, intensity, duration, and specificity.  There are no identifiable plans, no associated intent, mild dysphoria and related symptoms, good self-control (  both objective and subjective assessment), few other risk factors, and identifiable protective factors, including available and accessible social support.   Follow-up Information    [facility] Follow up on 05/01/2020.   Specialty: Behavioral Health Why: You have an appointment on 05/01/20 at  8:00 am for therapy.  You also have an appointment on 05/13/20 at 9:30 am for medication management.  Both of these appointments will be held Virtually via Mychart. Contact information: [address] [place] [address] [phone]              Plan Of Care/Follow-up recommendations:  Activity:  as tolerated  Diet:  regular Tests:  NA Other:  See below  Patient is expressing readiness for discharge and requesting to be discharged. No grounds for involuntary commitment, leaving unit in good spirits. Plans to return home.  Plans to follow up as above .   Craige Cotta, MD 04/18/2020, 9:01 AM

## 2020-04-18 NOTE — Discharge Summary (Addendum)
Physician Discharge Summary Note  Patient:  Brett Sanford is an 29 y.o., male who was admitted to the on 04/15/20 who has a hx of MDD  for suicide ideation. MRN:  [id number] DOB:  01/26/1991 Patient phone:  [phone] (home)  Patient address:   [address] [place] [address],  Total Time spent with patient: 30 minutes  Date of Admission:  04/15/2020 Date of Discharge: 04/18/20  Reason for Admission:  Suicide ideation  Principal Problem: MDD (major depressive disorder) Discharge Diagnoses: Principal Problem:   MDD (major depressive disorder)   Past Psychiatric History: Depression, Alcohol use disorder with intoxication.  Past Medical History:  Past Medical History:  Diagnosis Date  . ADD (attention deficit disorder)   . Drug abuse (HCC)   . Medical history non-contributory     Past Surgical History:  Procedure Laterality Date  . NO PAST SURGERIES     Family History:  Family History  Family history unknown: Yes   Family Psychiatric  History: Denies Social History:  Social History   Substance and Sexual Activity  Alcohol Use Yes   Comment: a few shots/5-6 days/week     Social History   Substance and Sexual Activity  Drug Use Yes  . Types: Marijuana   Comment: once a week    Social History   Socioeconomic History  . Marital status: Single    Spouse name: Not on file  . Number of children: Not on file  . Years of education: Not on file  . Highest education level: Not on file  Occupational History  . Not on file  Tobacco Use  . Smoking status: Current Every Day Smoker    Packs/day: 1.50    Years: 5.00    Pack years: 7.50  . Smokeless tobacco: Never Used  . Tobacco comment: 1-2 packs per day/5 years  Substance and Sexual Activity  . Alcohol use: Yes    Comment: a few shots/5-6 days/week  . Drug use: Yes    Types: Marijuana    Comment: once a week  . Sexual activity: Not on file  Other Topics Concern  . Not on file  Social History Narrative    Pt employed and living with mother who he identifies as his main social support. Pt has not taken his meds for the last 6 months. Pt has trouble getting medications because he does not have insurance.   Social Determinants of Health   Financial Resource Strain:   . Difficulty of Paying Living Expenses:   Food Insecurity:   . Worried About Programme researcher, broadcasting/film/video in the Last Year:   . Barista in the Last Year:   Transportation Needs:   . Freight forwarder (Medical):   [name] Lack of Transportation (Non-Medical):   Physical Activity:   . Days of Exercise per Week:   . Minutes of Exercise per Session:   Stress:   . Feeling of Stress :   Social Connections:   . Frequency of Communication with Friends and Family:   . Frequency of Social Gatherings with Friends and Family:   . Attends Religious Services:   . Active Member of Clubs or Organizations:   . Attends Banker Meetings:   [name] Marital Status:     Hospital Course:  Patient voluntarily admitted self to the hospital for suicide ideation.  He has been hospitalized in the past after suicide attempt by OD on medications. Past suicide attempts includes an attempt to suffocate  self and he tried cutting himself.  This time he felt suicidal for 3 days due to work stress.  He currently lives with his parents and states their relationship gets rough at times.  He does not have insurance to be able to get his medications so was not taking medications before coming to the hospital.  We started him on Venlafaxine and Trazodone for depression and anxiety.  We also offered Trazodone for sleep and Nicotine patch for Nicotine craving.  He was encouraged to participate in group activities which he did.  We placed him on CIWA assessment Protocol. Since his hospitalizations he has been calm, cooperative and compliant with his medications. He denies feeling suicidal this morning and has agreed to seek psychiatric care in the community.  He is  alert and oriented x4, insight and judgement is good.  Patient plans to seek outpatient psychiatric care.  He is discharged home today and Pharmacist will offer him samples of 7 days supply of his medications. Physical Findings: Negative ROS, Physical exam negative AIMS:  na, ,  ,  ,    CIWA:  CIWA-Ar Total: 0 COWS: na    Musculoskeletal: Strength & Muscle Tone: within normal limits Gait & Station: normal Patient leans: N/A  Psychiatric Specialty Exam: Physical Exam  Review of Systems  All other systems reviewed and are negative.   Blood pressure (!) 144/84, pulse 96, temperature 97.8 F (36.6 C), temperature source Oral, resp. rate 16, height 5\' 8"  (1.727 m), weight 98.9 kg, SpO2 99 %.Body mass index is 33.15 kg/m.  General Appearance: Well Groomed  Eye Contact:  Good  Speech:  Clear and Coherent  Volume:  Normal  Mood:  Euthymic  Affect:  Appropriate and Congruent  Thought Process:  Coherent  Orientation:  Full (Time, Place, and Person)  Thought Content:  Logical  Suicidal Thoughts:  No  Homicidal Thoughts:  No  Memory:  intact  Judgement:  Good  Insight:  Good  Psychomotor Activity:  Normal  Concentration:  Concentration: Good and Attention Span: Good  Recall:  Good  Fund of Knowledge:  Good  Language:  Good  Akathisia:  NA  Handed:  Right  AIMS (if indicated):     Assets:  Communication Skills Desire for Improvement Housing Physical Health Talents/Skills  ADL's:  Intact  Cognition:  WNL  Sleep:  Number of Hours: 6.75     Have you used any form of tobacco in the last 30 days? (Cigarettes, Smokeless Tobacco, Cigars, and/or Pipes): Yes  Has this patient used any form of tobacco in the last 30 days? (Cigarettes, Smokeless Tobacco, Cigars, and/or Pipes) Yes, Yes, A prescription for an FDA-approved tobacco cessation medication was offered at discharge and the patient refused  Blood Alcohol level:  Lab Results  Component Value Date   ETH <10 04/15/2020   ETH  272 (H) 10/06/2019    Metabolic Disorder Labs:  Lab Results  Component Value Date   HGBA1C 5.0 04/15/2020   MPG 96.8 04/15/2020   MPG 79.58 10/11/2019   No results found for: PROLACTIN Lab Results  Component Value Date   CHOL 258 (H) 04/15/2020   TRIG 244 (H) 04/15/2020   HDL 85 04/15/2020   CHOLHDL 3.0 04/15/2020   VLDL 49 (H) 04/15/2020   LDLCALC NOT CALCULATED 04/15/2020   LDLCALC 115 (H) 10/11/2019    See Psychiatric Specialty Exam and Suicide Risk Assessment completed by Attending Physician prior to discharge.  Discharge destination:  Home  Is patient on multiple  antipsychotic therapies at discharge:  No   Has Patient had three or more failed trials of antipsychotic monotherapy by history:  No  Recommended Plan for Multiple Antipsychotic Therapies: NA   Allergies as of 04/18/2020      Reactions   Benadryl [diphenhydramine Hcl] Other (See Comments)   Caused hyperactivity      Medication List    TAKE these medications     Indication  nicotine 21 mg/24hr patch Commonly known as: NICODERM CQ - dosed in mg/24 hours Place 1 patch (21 mg total) onto the skin daily for 7 days. Start taking on: April 19, 2020  Indication: Nicotine Addiction   thiamine 100 MG tablet Take 1 tablet (100 mg total) by mouth daily for 7 days. Start taking on: April 19, 2020  Indication: alcoholuse   traZODone 50 MG tablet Commonly known as: DESYREL Take 1 tablet (50 mg total) by mouth at bedtime as needed for up to 7 days for sleep.  Indication: Trouble Sleeping   venlafaxine XR 75 MG 24 hr capsule Commonly known as: EFFEXOR-XR Take 1 capsule (75 mg total) by mouth daily with breakfast for 7 days. Start taking on: April 19, 2020  Indication: Generalized Anxiety Disorder, Major Depressive Disorder       Follow-up Information    [facility] Follow up on 05/01/2020.   Specialty: Behavioral Health Why: You have an appointment on 05/01/20 at 8:00 am for  therapy.  You also have an appointment on 05/13/20 at 9:30 am for medication management.  Both of these appointments will be held Virtually via Mychart. Contact information: [address] [place] [address] [phone]              Follow-up recommendations:  Activity:  as tolerated  Comments:  Will be evaluated at the [facility] for outpatient counseling, substance abuse care and counseling.  SW will assist patient to secure assistance with his medications.  Signed: Earney Navy, NP 04/18/2020, 11:42 AM   Patient seen, Suicide Assessment Completed.  Disposition Plan Reviewed

## 2020-04-18 NOTE — Progress Notes (Signed)
  [facility] Adult Case Management Discharge Plan :  Will you be returning to the same living situation after discharge:  Yes,  with mother At discharge, do you have transportation home?: Yes,  arranged by patient Do you have the ability to pay for your medications: No.  No insurance, will need assistance from local agency  Release of information consent forms completed and emailed to Medical Records, then turned in to Medical Records by CSW.   Patient to Follow up at:  Follow-up Information    [facility] Follow up on 05/01/2020.   Specialty: Behavioral Health Why: You have an appointment on 05/01/20 at 8:00 am for therapy.  You also have an appointment on 05/13/20 at 9:30 am for medication management.  Both of these appointments will be held Virtually via Mychart. Contact information: [address] [place] [address] [phone]              Next level of care provider has access to [facility] Link:yes  Safety Planning and Suicide Prevention discussed: No.  Declined  Have you used any form of tobacco in the last 30 days? (Cigarettes, Smokeless Tobacco, Cigars, and/or Pipes): Yes  Has patient been referred to the Quitline?: Patient refused referral  Patient has been referred for addiction treatment: Yes  Lynnell Chad, LCSW 04/18/2020, 9:34 AM

## 2020-04-25 ENCOUNTER — Other Ambulatory Visit: Payer: Self-pay

## 2020-04-25 ENCOUNTER — Encounter (HOSPITAL_COMMUNITY): Payer: Self-pay | Admitting: Emergency Medicine

## 2020-04-25 ENCOUNTER — Emergency Department (HOSPITAL_COMMUNITY)
Admission: EM | Admit: 2020-04-25 | Discharge: 2020-04-25 | Disposition: A | Discharge status: Home / Self Care | Payer: Self-pay | Attending: Emergency Medicine | Admitting: Emergency Medicine

## 2020-04-25 ENCOUNTER — Emergency Department (HOSPITAL_COMMUNITY): Payer: Self-pay

## 2020-04-25 DIAGNOSIS — R42 Dizziness and giddiness: Secondary | ICD-10-CM | POA: Insufficient documentation

## 2020-04-25 DIAGNOSIS — F17210 Nicotine dependence, cigarettes, uncomplicated: Secondary | ICD-10-CM | POA: Insufficient documentation

## 2020-04-25 DIAGNOSIS — Z79899 Other long term (current) drug therapy: Secondary | ICD-10-CM | POA: Insufficient documentation

## 2020-04-25 LAB — URINALYSIS, ROUTINE W REFLEX MICROSCOPIC
Bilirubin Urine: NEGATIVE
Glucose, UA: NEGATIVE mg/dL
Hgb urine dipstick: NEGATIVE
Ketones, ur: NEGATIVE mg/dL
Leukocytes,Ua: NEGATIVE
Nitrite: NEGATIVE
Protein, ur: NEGATIVE mg/dL
Specific Gravity, Urine: 1.024 (ref 1.005–1.030)
pH: 6 (ref 5.0–8.0)

## 2020-04-25 LAB — CBC WITH DIFFERENTIAL/PLATELET
Abs Immature Granulocytes: 0.12 10*3/uL — ABNORMAL HIGH (ref 0.00–0.07)
Basophils Absolute: 0.1 10*3/uL (ref 0.0–0.1)
Basophils Relative: 1 %
Eosinophils Absolute: 0.3 10*3/uL (ref 0.0–0.5)
Eosinophils Relative: 4 %
HCT: 45.9 % (ref 39.0–52.0)
Hemoglobin: 15.5 g/dL (ref 13.0–17.0)
Immature Granulocytes: 2 %
Lymphocytes Relative: 38 %
Lymphs Abs: 2.7 10*3/uL (ref 0.7–4.0)
MCH: 30.7 pg (ref 26.0–34.0)
MCHC: 33.8 g/dL (ref 30.0–36.0)
MCV: 90.9 fL (ref 80.0–100.0)
Monocytes Absolute: 0.7 10*3/uL (ref 0.1–1.0)
Monocytes Relative: 9 %
Neutro Abs: 3.2 10*3/uL (ref 1.7–7.7)
Neutrophils Relative %: 46 %
Platelets: 259 10*3/uL (ref 150–400)
RBC: 5.05 MIL/uL (ref 4.22–5.81)
RDW: 12.3 % (ref 11.5–15.5)
WBC: 7.1 10*3/uL (ref 4.0–10.5)
nRBC: 0 % (ref 0.0–0.2)

## 2020-04-25 LAB — BASIC METABOLIC PANEL
Anion gap: 11 (ref 5–15)
BUN: 9 mg/dL (ref 6–20)
CO2: 25 mmol/L (ref 22–32)
Calcium: 9.2 mg/dL (ref 8.9–10.3)
Chloride: 102 mmol/L (ref 98–111)
Creatinine, Ser: 1.01 mg/dL (ref 0.61–1.24)
GFR calc Af Amer: >60 mL/min (ref >60)
GFR calc non Af Amer: >60 mL/min (ref >60)
Glucose, Bld: 83 mg/dL (ref 70–99)
Potassium: 3.8 mmol/L (ref 3.5–5.1)
Sodium: 138 mmol/L (ref 135–145)

## 2020-04-25 LAB — RAPID URINE DRUG SCREEN, HOSP PERFORMED
Amphetamines: NOT DETECTED
Barbiturates: NOT DETECTED
Benzodiazepines: NOT DETECTED
Cocaine: NOT DETECTED
Opiates: NOT DETECTED
Tetrahydrocannabinol: POSITIVE — AB

## 2020-04-25 LAB — TSH: TSH: 1.02 u[IU]/mL (ref 0.350–4.500)

## 2020-04-25 MED ORDER — VENLAFAXINE HCL ER 37.5 MG PO CP24
37.5000 mg | ORAL_CAPSULE | Freq: Every day | ORAL | 0 refills | Status: DC
Start: 2020-04-25 — End: 2020-05-13

## 2020-04-25 NOTE — Discharge Instructions (Signed)
You were seen today for dizziness. Overall your workup was reassuring. This may be caused from your effexor so we will try a decreased dose for now. Your EKG was unchanged from previous EKG but pease note that you have some Ectopy (skipped beats) which have been occurring for some time now. They often occur without a clear cause and are most often harmless. You can follow up with your primary care provider for this. Thank you for allowing me to care for you today. Please return to the emergency department if you have new or worsening symptoms. Take your medications as instructed.

## 2020-04-25 NOTE — ED Provider Notes (Signed)
[facility] EMERGENCY DEPARTMENT Provider Note   CSN: [id number] Arrival date & time: 04/25/20  4854     History Chief Complaint  Patient presents with  . Allergic Reaction  . Headache  . Excessive Sweating    Brett Sanford is a 29 y.o. male.  Patient is a 29 year old male with past medical history of drug abuse, alcohol abuse, ADD, depression and anxiety who was recently released from a behavioral health inpatient admission for suicidal ideation presenting to the emergency department for concerns of side effects from his medication.  While in the behavioral health hospital he was started on Effexor.  It was titrated up to 75 mg.  Reports that since yesterday he has been feeling more dizzy and almost had a fall at work.  He has also had increased sweating.  He thinks this is from the Effexor.  His mother did not want to drive him to work for fear that he might fall at work and have an injury so she brought him to be seen in the emergency department.  He also admits to a headache when he was initially discharged from behavioral health hospital on the 10th of this month but reports that that has now resolved.  He has not been in touch with his psychiatrist.  He has a follow-up appointment for medication management on 4 August.  He denies any chest pain, shortness of breath, coughing, fever, blurry vision, syncope, lightheadedness.        Past Medical History:  Diagnosis Date  . ADD (attention deficit disorder)   . Drug abuse (HCC)   . Medical history non-contributory     Patient Active Problem List   Diagnosis Date Noted  . Drug overdose, intentional self-harm, initial encounter (HCC) 10/07/2019  . Alcoholic intoxication without complication (HCC)   . Suicide attempt (HCC)   . Viral illness 08/21/2019  . Hypokalemia 06/23/2019  . Attention deficit hyperactivity disorder (ADHD) 06/23/2019  . Seizure-like activity (HCC) 06/23/2019  . History of alcohol abuse  06/23/2019  . Chronic migraine 06/23/2019  . Diarrhea 06/23/2019  . MDD (major depressive disorder) 03/28/2019    Past Surgical History:  Procedure Laterality Date  . NO PAST SURGERIES         Family History  Family history unknown: Yes    Social History   Tobacco Use  . Smoking status: Current Every Day Smoker    Packs/day: 1.50    Years: 5.00    Pack years: 7.50  . Smokeless tobacco: Never Used  . Tobacco comment: 1-2 packs per day/5 years  Substance Use Topics  . Alcohol use: Yes    Comment: a few shots/5-6 days/week  . Drug use: Yes    Types: Marijuana    Comment: once a week    Home Medications Prior to Admission medications   Medication Sig Start Date End Date Taking? Authorizing Provider  traZODone (DESYREL) 50 MG tablet Take 1 tablet (50 mg total) by mouth at bedtime as needed for up to 7 days for sleep. 04/18/20 04/25/20  Earney Navy, NP  venlafaxine XR (EFFEXOR XR) 37.5 MG 24 hr capsule Take 1 capsule (37.5 mg total) by mouth daily with breakfast for 21 days. 04/25/20 05/16/20  Arlyn Dunning, PA-C    Allergies    Benadryl [diphenhydramine hcl]  Review of Systems   Review of Systems  Constitutional: Positive for diaphoresis. Negative for appetite change, chills, fatigue and fever.  HENT: Negative for congestion and sore throat.  Eyes: Negative for visual disturbance.  Respiratory: Negative for cough and shortness of breath.   Cardiovascular: Negative for chest pain, palpitations and leg swelling.  Gastrointestinal: Negative.   Genitourinary: Negative for dysuria.  Musculoskeletal: Negative for back pain.  Skin: Negative for rash.  Neurological: Positive for dizziness and headaches (After admission but now resolved). Negative for light-headedness and numbness.    Physical Exam Updated Vital Signs BP 127/77   Pulse 72   Temp 97.7 F (36.5 C) (Oral)   Resp 17   Ht 5\' 8"  (1.727 m)   Wt 90.7 kg   SpO2 99%   BMI 30.41 kg/m   Physical  Exam Vitals and nursing note reviewed.  Constitutional:      General: He is not in acute distress.    Appearance: Normal appearance. He is well-developed. He is not ill-appearing, toxic-appearing or diaphoretic.  HENT:     Head: Normocephalic.     Mouth/Throat:     Mouth: Mucous membranes are moist.  Eyes:     General: No visual field deficit.    Extraocular Movements: Extraocular movements intact.     Right eye: Normal extraocular motion and no nystagmus.     Left eye: Normal extraocular motion and no nystagmus.     Conjunctiva/sclera: Conjunctivae normal.  Cardiovascular:     Rate and Rhythm: Normal rate and regular rhythm.     Heart sounds: Normal heart sounds.  Pulmonary:     Effort: Pulmonary effort is normal.     Breath sounds: Normal breath sounds.  Abdominal:     General: Bowel sounds are normal.     Palpations: Abdomen is soft.  Skin:    General: Skin is warm and dry.  Neurological:     Mental Status: He is alert and oriented to person, place, and time.     Cranial Nerves: No cranial nerve deficit, dysarthria or facial asymmetry.     Sensory: No sensory deficit.     Motor: No weakness.     Coordination: Coordination normal.     Deep Tendon Reflexes: Reflexes normal.  Psychiatric:        Mood and Affect: Mood normal.     ED Results / Procedures / Treatments   Labs (all labs ordered are listed, but only abnormal results are displayed) Labs Reviewed  CBC WITH DIFFERENTIAL/PLATELET - Abnormal; Notable for the following components:      Result Value   Abs Immature Granulocytes 0.12 (*)    All other components within normal limits  RAPID URINE DRUG SCREEN, HOSP PERFORMED - Abnormal; Notable for the following components:   Tetrahydrocannabinol POSITIVE (*)    All other components within normal limits  BASIC METABOLIC PANEL  URINALYSIS, ROUTINE W REFLEX MICROSCOPIC  TSH    EKG EKG Interpretation  Date/Time:  Saturday April 25 2020 10:22:38 EDT Ventricular  Rate:  85 PR Interval:    QRS Duration: 87 QT Interval:  356 QTC Calculation: 424 R Axis:   36 Text Interpretation: Ectopic atrial rhythm Confirmed by 08[birth year]1991 [phone]) on 04/27/2020 3:10:46 PM   Radiology No results found.  Procedures Procedures (including critical care time)  Medications Ordered in ED Medications - No data to display  ED Course  I have reviewed the triage vital signs and the nursing notes.  Pertinent labs & imaging results that were available during my care of the patient were reviewed by me and considered in my medical decision making (see chart for details).  Clinical Course as of Apr 28 2205  Sat Apr 25, 2020  1009 Patient presenting with dizziness, headache and diaphoresis which he attributes to recent new increase in Effexor medication.  On exam he appears well with a normal neurological exam.  It is a possibility that these symptoms could be medication related.  However we will do a work-up to rule out other causes of dizziness.  He currently does not have any headache.  Awaiting labs, chest x-ray, EKG.   [KM]  1219 Patient work-up is overall reassuring. His EKG shows signs of atrial ectopy. His heart rate is now normal. He has had these on his EKG for several years now has not had any palpitations, chest pain. Advised patient to follow-up with his psychiatrist as well as his primary medical doctor. Advised patient we should do a trial of decreasing his Effexor until he sees his psychiatrist for medication management on 4 August. He agrees with this. Otherwise he feels fine at this time. Advised on return precautions   [KM]    Clinical Course User Index [KM] Jeral Pinch   MDM Rules/Calculators/A&P                          Based on review of vitals, medical screening exam, lab work and/or imaging, there does not appear to be an acute, emergent etiology for the patient's symptoms. Counseled pt on good return precautions and encouraged both PCP  and ED follow-up as needed.  Prior to discharge, I also discussed incidental imaging findings with patient in detail and advised appropriate, recommended follow-up in detail.  Clinical Impression: 1. Dizziness     Disposition: Discharge  Prior to providing a prescription for a controlled substance, I independently reviewed the patient's recent prescription history on the [place] Controlled Substance Reporting System. The patient had no recent or regular prescriptions and was deemed appropriate for a brief, less than 3 day prescription of narcotic for acute analgesia.  This note was prepared with assistance of Conservation officer, historic buildings. Occasional wrong-word or sound-a-like substitutions may have occurred due to the inherent limitations of voice recognition software.  Final Clinical Impression(s) / ED Diagnoses Final diagnoses:  Dizziness    Rx / DC Orders ED Discharge Orders         Ordered    venlafaxine XR (EFFEXOR XR) 37.5 MG 24 hr capsule  Daily with breakfast     Discontinue  Reprint     04/25/20 1235           Jeral Pinch 04/28/20 2207    Rolan Bucco, MD 04/30/20 1504

## 2020-04-25 NOTE — ED Triage Notes (Signed)
Pt. Stated, I think Im having reactions to the Effexor . Ive had a headache, sweating more and lightheadedness. I was discharged form Behavior Health  Week ago and put on the medication.

## 2020-04-25 NOTE — ED Notes (Signed)
Patient verbalizes understanding of discharge instructions . Opportunity for questions and answers were provided . Armband removed by staff ,Pt discharged from ED. W/C  offered at D/C  and Declined W/C at D/C and was escorted to lobby by RN.  

## 2020-04-25 NOTE — ED Notes (Signed)
Pt reports he has been taking Effexor for 12 days. Pt reports feeling dizzy for last 2 days. Pt has not had any N/V/D . Pt was able to ambulate to room with out assistance.

## 2020-04-29 ENCOUNTER — Other Ambulatory Visit: Payer: Self-pay

## 2020-04-29 ENCOUNTER — Emergency Department (HOSPITAL_COMMUNITY)
Admission: EM | Admit: 2020-04-29 | Discharge: 2020-04-29 | Disposition: A | Discharge status: Home / Self Care | Payer: Medicaid Other

## 2020-05-01 ENCOUNTER — Ambulatory Visit (HOSPITAL_COMMUNITY): Payer: Federal, State, Local not specified - Other | Admitting: Clinical

## 2020-05-01 ENCOUNTER — Other Ambulatory Visit: Payer: Self-pay

## 2020-05-13 ENCOUNTER — Telehealth (INDEPENDENT_AMBULATORY_CARE_PROVIDER_SITE_OTHER): Payer: No Payment, Other | Admitting: Psychiatric/Mental Health

## 2020-05-13 ENCOUNTER — Encounter (HOSPITAL_COMMUNITY): Payer: Self-pay | Admitting: Psychiatric/Mental Health

## 2020-05-13 ENCOUNTER — Other Ambulatory Visit: Payer: Self-pay

## 2020-05-13 DIAGNOSIS — F332 Major depressive disorder, recurrent severe without psychotic features: Secondary | ICD-10-CM | POA: Diagnosis not present

## 2020-05-13 MED ORDER — VENLAFAXINE HCL ER 37.5 MG PO CP24: 37.5000 mg | ORAL_CAPSULE | Freq: Two times a day (BID) | ORAL | 3 refills | Status: DC

## 2020-05-13 MED ORDER — TRAZODONE HCL 50 MG PO TABS: 100.0000 mg | ORAL_TABLET | Freq: Every evening | ORAL | 3 refills | Status: DC | PRN

## 2020-05-13 NOTE — Progress Notes (Signed)
Psychiatric Initial Adult Assessment   Virtual Visit via Video Note  I connected with   on 05/13/20  by a video enabled telemedicine application and verified that I am speaking with the correct person using two identifiers.  Location: Patient: Home Provider: Home office  I discussed the limitations of evaluation and management by telemedicine and the availability of in person appointments. The patient expressed understanding and agreed to proceed.  I provided 45 minutes of non-face-to-face time during this encounter.  Patient Identification: Brett Sanford MRN:  [id number] Date of Evaluation:  05/13/2020 Referral Source: Vesta Mixer  Chief Complaint:  "Im fine" Visit Diagnosis:    ICD-10-CM   1. Severe episode of recurrent major depressive disorder, without psychotic features (HCC)  F33.2 traZODone (DESYREL) 50 MG tablet    venlafaxine XR (EFFEXOR XR) 37.5 MG 24 hr capsule    Patient  reports his medications are still working well for him, "They keep me calm". He's still motivated and productive. Denies any recent flashbacks or nightmares related to trauma.  Denies HI/SI/AVH. Denies side effects or problems with current medication. Associated Signs/Symptoms: Depression Symptoms:  anxiety, (Hypo) Manic Symptoms:  Labiality of Mood, Anxiety Symptoms:  na Psychotic Symptoms:  Paranoia, PTSD Symptoms: NA  Past Psychiatric History:   Previous Psychotropic Medications: No   Substance Abuse History in the last 12 months:  No.  Consequences of Substance Abuse: NA  Past Medical History:  Past Medical History:  Diagnosis Date  . ADD (attention deficit disorder)   . Drug abuse (HCC)   . Medical history non-contributory     Past Surgical History:  Procedure Laterality Date  . NO PAST SURGERIES      Family Psychiatric History:   Family History:  Family History  Family history unknown: Yes    Social History:   Social History   Socioeconomic History  . Marital status: Single     Spouse name: Not on file  . Number of children: Not on file  . Years of education: Not on file  . Highest education level: Not on file  Occupational History  . Not on file  Tobacco Use  . Smoking status: Current Every Day Smoker    Packs/day: 1.50    Years: 5.00    Pack years: 7.50  . Smokeless tobacco: Never Used  . Tobacco comment: 1-2 packs per day/5 years  Substance and Sexual Activity  . Alcohol use: Yes    Comment: a few shots/5-6 days/week  . Drug use: Yes    Types: Marijuana    Comment: once a week  . Sexual activity: Not on file  Other Topics Concern  . Not on file  Social History Narrative   Pt employed and living with mother who he identifies as his main social support. Pt has not taken his meds for the last 6 months. Pt has trouble getting medications because he does not have insurance.   Social Determinants of Health   Financial Resource Strain:   . Difficulty of Paying Living Expenses:   Food Insecurity:   . Worried About Programme researcher, broadcasting/film/video in the Last Year:   . Barista in the Last Year:   Transportation Needs:   . Freight forwarder (Medical):   [name] Lack of Transportation (Non-Medical):   Physical Activity:   . Days of Exercise per Week:   . Minutes of Exercise per Session:   Stress:   . Feeling of Stress :   Social Connections:   .  Frequency of Communication with Friends and Family:   . Frequency of Social Gatherings with Friends and Family:   . Attends Religious Services:   . Active Member of Clubs or Organizations:   . Attends Banker Meetings:   [name] Marital Status:     Additional Social History:   Allergies:   Allergies  Allergen Reactions  . Benadryl [Diphenhydramine Hcl] Other (See Comments)    Caused hyperactivity    Metabolic Disorder Labs: Lab Results  Component Value Date   HGBA1C 5.0 04/15/2020   MPG 96.8 04/15/2020   MPG 79.58 10/11/2019   No results found for: PROLACTIN Lab Results  Component  Value Date   CHOL 258 (H) 04/15/2020   TRIG 244 (H) 04/15/2020   HDL 85 04/15/2020   CHOLHDL 3.0 04/15/2020   VLDL 49 (H) 04/15/2020   LDLCALC NOT CALCULATED 04/15/2020   LDLCALC 115 (H) 10/11/2019   Lab Results  Component Value Date   TSH 1.020 04/25/2020    Therapeutic Level Labs: No results found for: LITHIUM No results found for: CBMZ No results found for: VALPROATE  Current Medications: Current Outpatient Medications  Medication Sig Dispense Refill  . traZODone (DESYREL) 50 MG tablet Take 2 tablets (100 mg total) by mouth at bedtime as needed for sleep. 60 tablet 3  . venlafaxine XR (EFFEXOR XR) 37.5 MG 24 hr capsule Take 1 capsule (37.5 mg total) by mouth 2 (two) times daily. Take one tablet by mouth in the am and one tablet by mouth in the evening 60 capsule 3   No current facility-administered medications for this visit.    Musculoskeletal: Strength & Muscle Tone: within normal limits Gait & Station: normal Patient leans: N/A  Psychiatric Specialty Exam: Review of Systems  Psychiatric/Behavioral: Negative for dysphoric mood, hallucinations and suicidal ideas.  All other systems reviewed and are negative.   There were no vitals taken for this visit.There is no height or weight on file to calculate BMI.  General Appearance: NA  Eye Contact:  NA  Speech:  NA  Volume:  Normal  Mood:  NA  Affect:  Congruent  Thought Process:  Coherent and Descriptions of Associations: Intact  Orientation:  Full (Time, Place, and Person)  Thought Content:  WDL  Suicidal Thoughts:  No  Homicidal Thoughts:  No  Memory:  Immediate;   Fair  Judgement:  Fair  Insight:  Fair  Psychomotor Activity:  Normal  Concentration:  Attention Span: Fair  Recall:  [employer] of Knowledge:Fair  Language: Good  Akathisia:  NA  Handed:  Right  AIMS (if indicated):  not done  Assets:  Communication Skills Desire for Improvement Financial Resources/Insurance Housing Resilience Social  Support  ADL's:  Intact  Cognition: WNL  Sleep:  Good   Screenings: AIMS     [facility]  AIMS Total Score 0    AUDIT     [facility]) [facility] Ad[facility] Admission (Dis[facility]  Alcohol Use Disorder Identification Test Final Score (AUDIT) 9 0 33    GAD-7     Office Visit from 06/18/2019 in [facility]  Total GAD-7 Score 16    PHQ2-9     Office Visit from 06/18/2019 in [facility]  PHQ-2 Total Score 2  PHQ-9 Total Score  10      Assessment and Plan: continue with medication regimen.   ICD-10-CM   1. Severe episode of recurrent major depressive disorder, without psychotic features (HCC)  F33.2 traZODone (DESYREL) 50 MG tablet    venlafaxine XR (EFFEXOR XR) 37.5 MG 24 hr capsule    Jearld Lesch, NP 8/4/20219:55 AM

## 2020-05-21 ENCOUNTER — Encounter (HOSPITAL_COMMUNITY): Payer: Self-pay

## 2020-05-21 ENCOUNTER — Other Ambulatory Visit: Payer: Self-pay

## 2020-05-21 ENCOUNTER — Emergency Department (HOSPITAL_COMMUNITY)
Admission: EM | Admit: 2020-05-21 | Discharge: 2020-05-21 | Disposition: A | Discharge status: Home / Self Care | Payer: Medicaid Other | Attending: Emergency Medicine | Admitting: Emergency Medicine

## 2020-05-21 DIAGNOSIS — R1011 Right upper quadrant pain: Secondary | ICD-10-CM | POA: Insufficient documentation

## 2020-05-21 DIAGNOSIS — R112 Nausea with vomiting, unspecified: Secondary | ICD-10-CM | POA: Insufficient documentation

## 2020-05-21 DIAGNOSIS — Z5321 Procedure and treatment not carried out due to patient leaving prior to being seen by health care provider: Secondary | ICD-10-CM | POA: Insufficient documentation

## 2020-05-21 LAB — CBC
HCT: 44.4 % (ref 39.0–52.0)
Hemoglobin: 15 g/dL (ref 13.0–17.0)
MCH: 31.1 pg (ref 26.0–34.0)
MCHC: 33.8 g/dL (ref 30.0–36.0)
MCV: 91.9 fL (ref 80.0–100.0)
Platelets: 259 10*3/uL (ref 150–400)
RBC: 4.83 MIL/uL (ref 4.22–5.81)
RDW: 12.9 % (ref 11.5–15.5)
WBC: 7 10*3/uL (ref 4.0–10.5)
nRBC: 0 % (ref 0.0–0.2)

## 2020-05-21 LAB — COMPREHENSIVE METABOLIC PANEL
ALT: 29 U/L (ref 0–44)
AST: 30 U/L (ref 15–41)
Albumin: 3.8 g/dL (ref 3.5–5.0)
Alkaline Phosphatase: 51 U/L (ref 38–126)
Anion gap: 12 (ref 5–15)
BUN: 15 mg/dL (ref 6–20)
CO2: 20 mmol/L — ABNORMAL LOW (ref 22–32)
Calcium: 9 mg/dL (ref 8.9–10.3)
Chloride: 106 mmol/L (ref 98–111)
Creatinine, Ser: 0.9 mg/dL (ref 0.61–1.24)
GFR calc Af Amer: >60 mL/min (ref >60)
GFR calc non Af Amer: >60 mL/min (ref >60)
Glucose, Bld: 97 mg/dL (ref 70–99)
Potassium: 4.1 mmol/L (ref 3.5–5.1)
Sodium: 138 mmol/L (ref 135–145)
Total Bilirubin: 1 mg/dL (ref 0.3–1.2)
Total Protein: 6.5 g/dL (ref 6.5–8.1)

## 2020-05-21 LAB — LIPASE, BLOOD: Lipase: 27 U/L (ref 11–51)

## 2020-05-21 NOTE — ED Notes (Signed)
Patient left AMA.

## 2020-05-21 NOTE — ED Triage Notes (Signed)
Pt arrives to ED w/ c/o 7/10 R/LUQ abdominal pain that started 3 weeks ago. Pt endorses n/v, denies diarrhea.

## 2020-07-23 ENCOUNTER — Other Ambulatory Visit: Payer: Medicaid Other

## 2020-07-23 DIAGNOSIS — Z20822 Contact with and (suspected) exposure to covid-19: Secondary | ICD-10-CM

## 2020-07-24 LAB — NOVEL CORONAVIRUS, NAA: SARS-CoV-2, NAA: NOT DETECTED

## 2020-07-24 LAB — SARS-COV-2, NAA 2 DAY TAT

## 2020-08-12 ENCOUNTER — Telehealth (HOSPITAL_COMMUNITY): Payer: Federal, State, Local not specified - Other | Admitting: Psychiatry

## 2020-08-14 ENCOUNTER — Other Ambulatory Visit: Payer: Self-pay

## 2020-08-14 ENCOUNTER — Telehealth (HOSPITAL_COMMUNITY): Payer: Federal, State, Local not specified - Other | Admitting: Physician Assistant

## 2021-10-12 IMAGING — CT CT ABD-PELV W/ CM
2 of 4 series · 16 of 46 positions shown, 18 images · IV contrast (omnipaque)
Comparison: None.

CLINICAL DATA: Nausea vomiting abdominal pain, right lower quadrant

EXAM:
CT ABDOMEN AND PELVIS WITH CONTRAST
TECHNIQUE: Multidetector CT imaging of the abdomen and pelvis was performed
using the standard protocol following bolus administration of
intravenous contrast.
CONTRAST:  100mL OMNIPAQUE IOHEXOL 300 MG/ML  SOLN

[Series 3: abdomen 5.0 · axial · 0.79mm/px · z∈[-769,-359]mm · 13 of 94 slices shown, 15 images]
[im 6/94  soft-tissue]
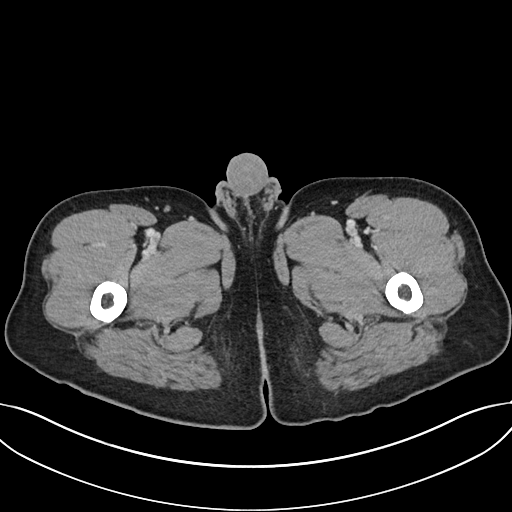
[im 6/94  bone]
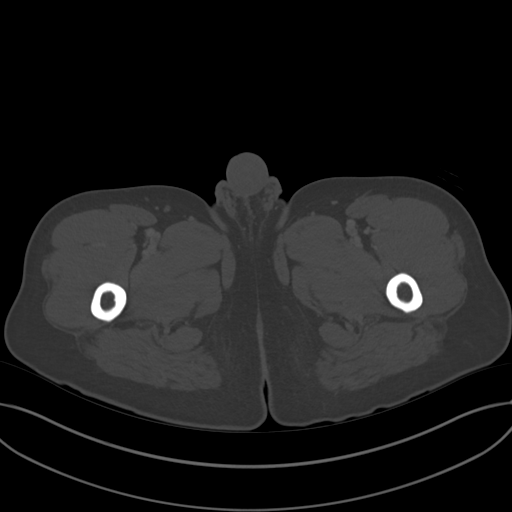
[im 11/94  soft-tissue]
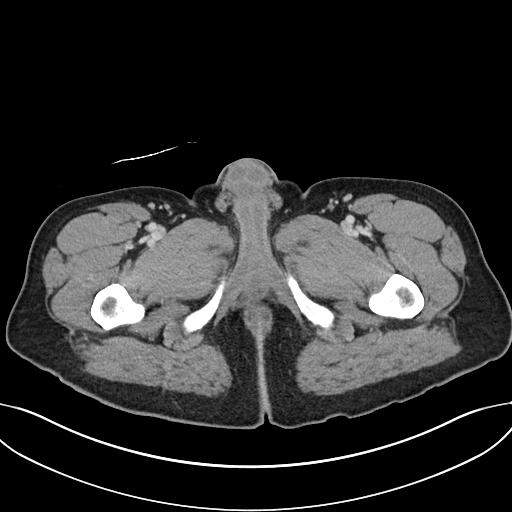
[im 22/94  soft-tissue]
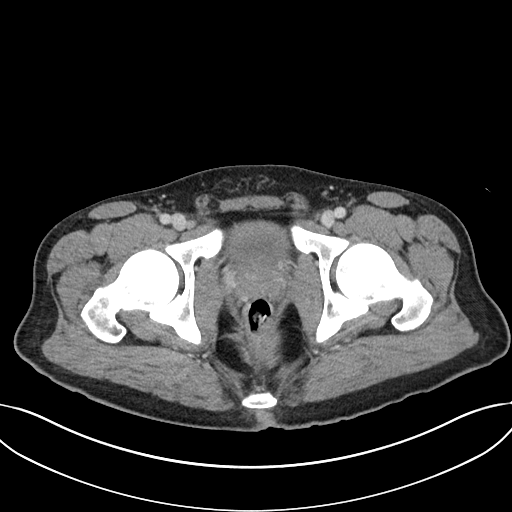
[im 28/94  soft-tissue]
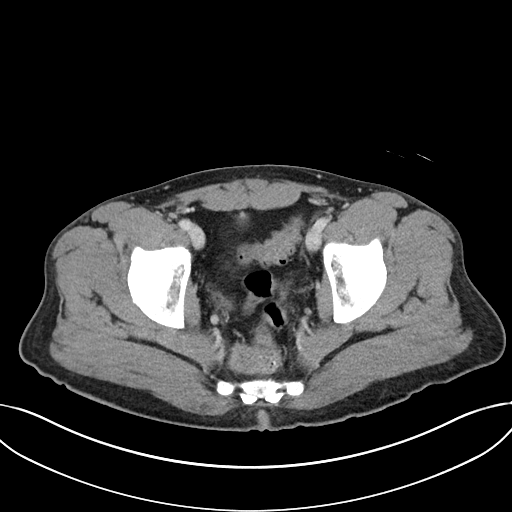
[im 33/94  soft-tissue]
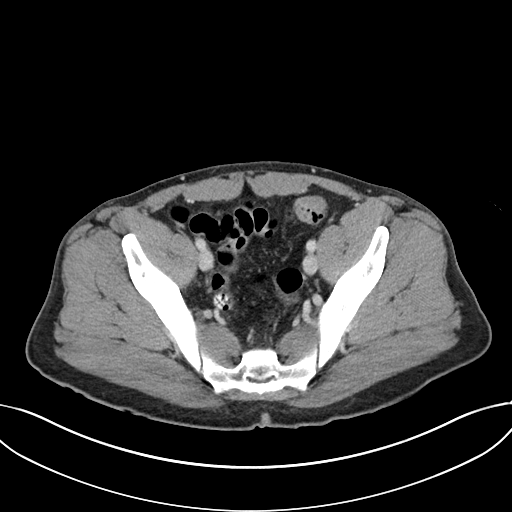
[im 39/94  soft-tissue]
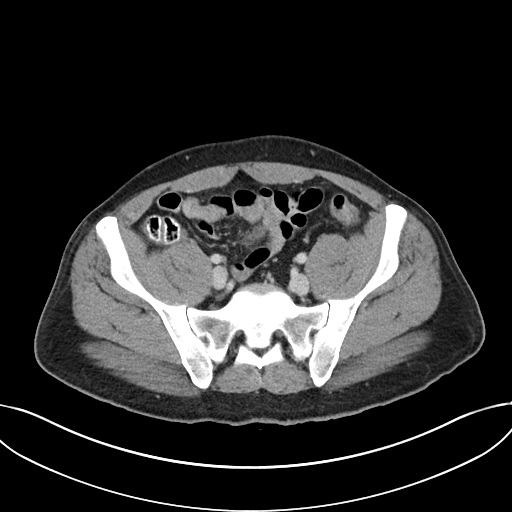
[im 50/94  soft-tissue]
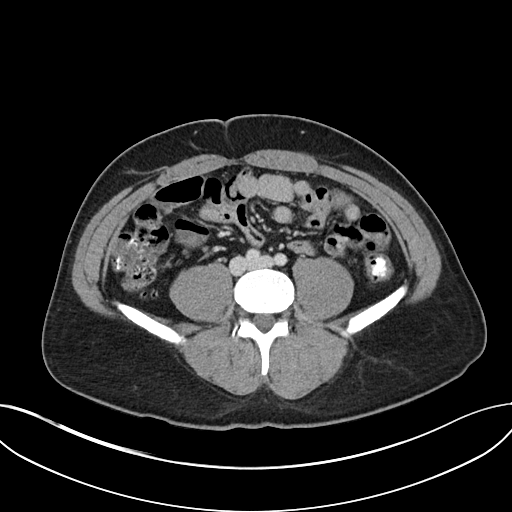
[im 55/94  soft-tissue]
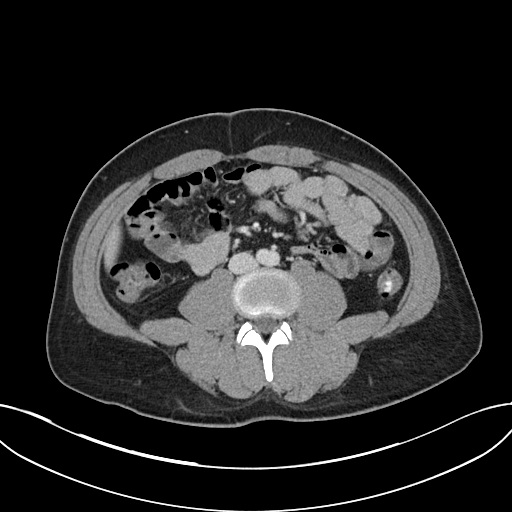
[im 61/94  soft-tissue]
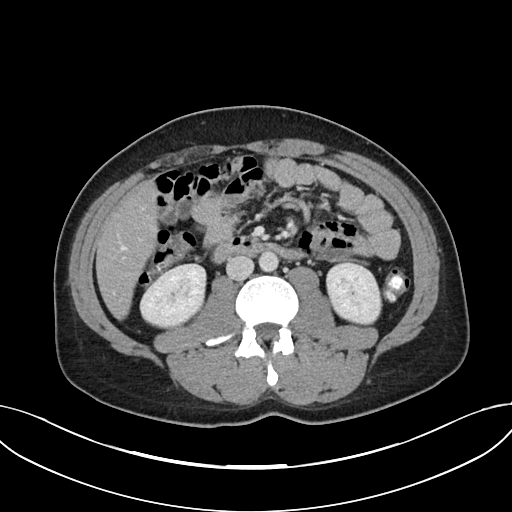
[im 61/94  bone]
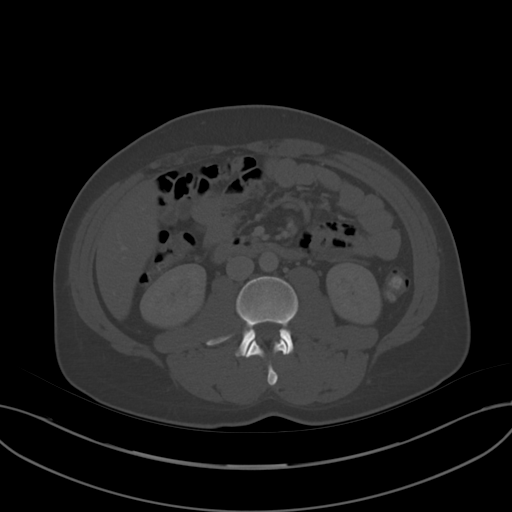
[im 66/94  soft-tissue]
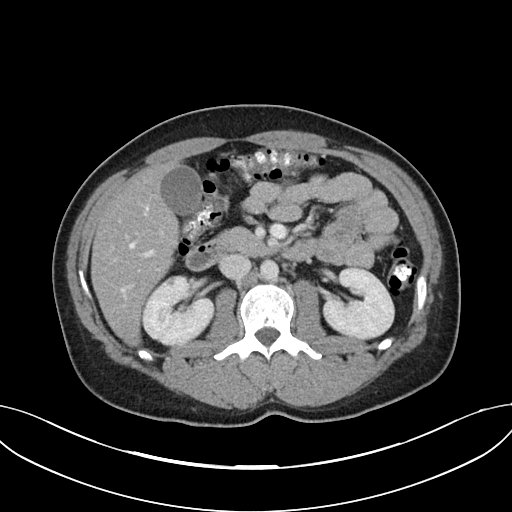
[im 72/94  soft-tissue]
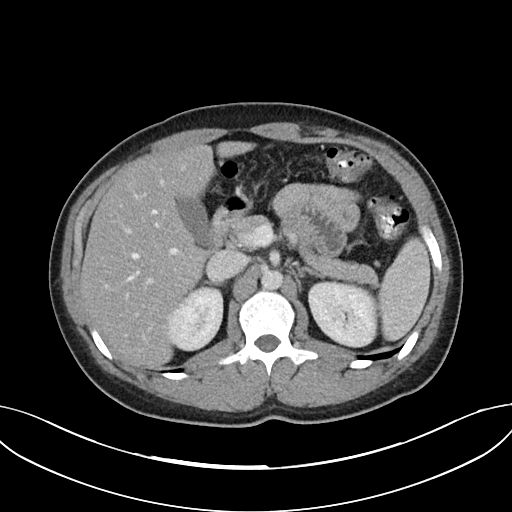
[im 83/94  soft-tissue]
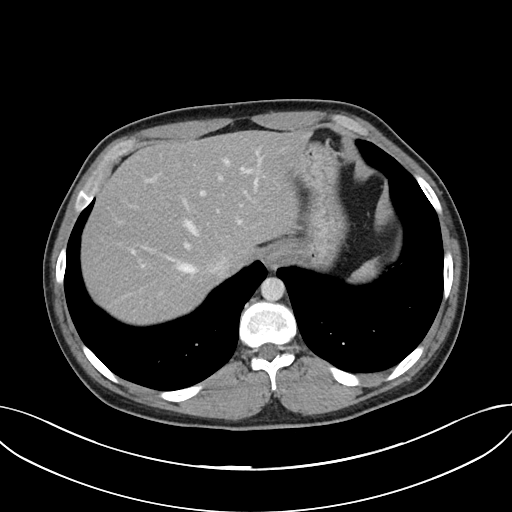
[im 88/94  soft-tissue]
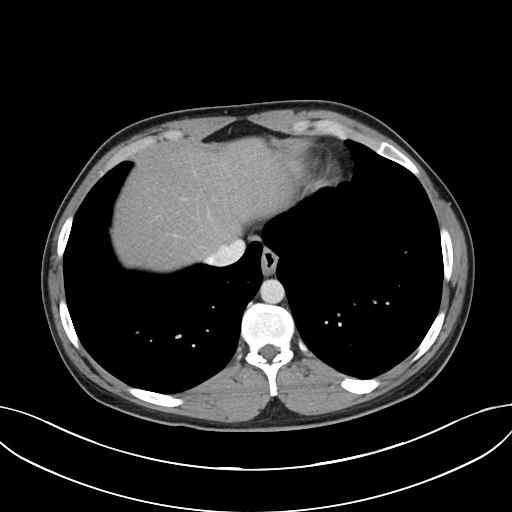

[Series 6: abdomen 3.0 mpr cor · coronal · 0.82mm/px · 3 of 93 slices shown]
[im 31/93  soft-tissue]
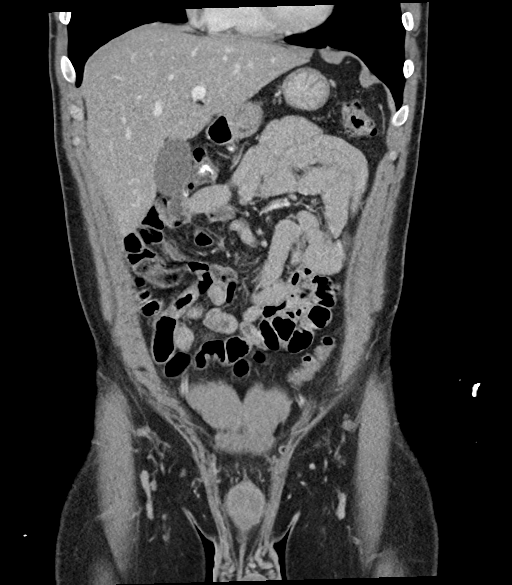
[im 41/93  soft-tissue]
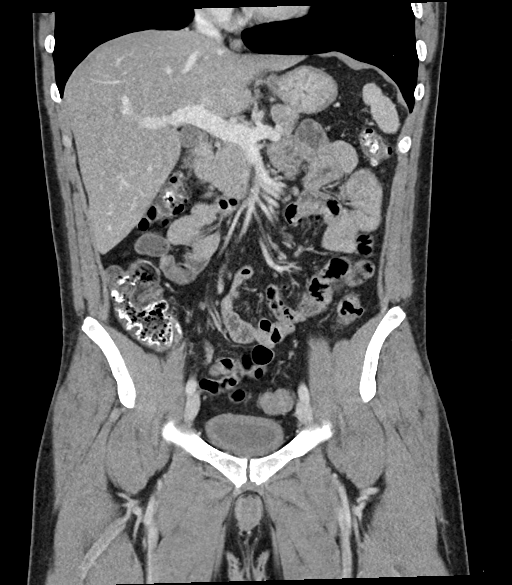
[im 52/93  soft-tissue]
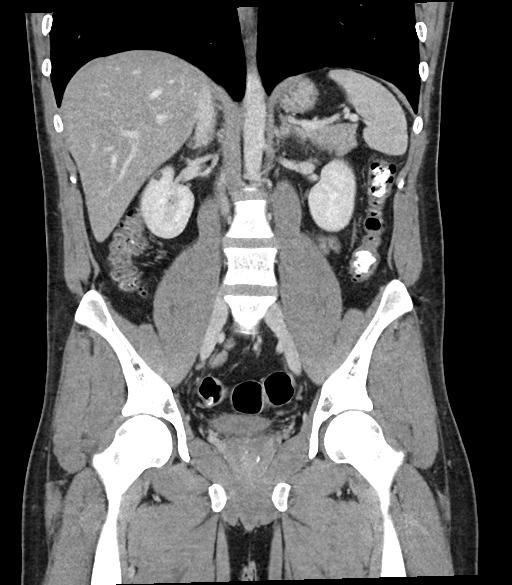

[16 of 46 positions shown; findings below may reference images not displayed]

FINDINGS: Lower chest: The visualized heart size within normal limits. No
pericardial fluid/thickening.

No hiatal hernia.

The visualized portions of the lungs are clear.

Hepatobiliary: The liver is normal in density without focal
abnormality.The main portal vein is patent. No evidence of calcified
gallstones, gallbladder wall thickening or biliary dilatation.

Pancreas: Unremarkable. No pancreatic ductal dilatation or
surrounding inflammatory changes.

Spleen: Normal in size without focal abnormality.

Adrenals/Urinary Tract: Both adrenal glands appear normal. Tiny
hypodense lesion seen within the right upper kidney. The left kidney
is unremarkable. No renal or collecting system calculi. The bladder
is partially distended.

Stomach/Bowel: The stomach and small bowel are normal in appearance.
Scattered colonic diverticula are seen throughout without evidence
diverticulitis. The appendix is normal in appearance.

Vascular/Lymphatic: There are no enlarged mesenteric,
retroperitoneal, or pelvic lymph nodes. No significant vascular
findings are present.

Reproductive: The prostate is unremarkable.

Other: No evidence of abdominal wall mass or hernia.

Musculoskeletal: No acute or significant osseous findings.
IMPRESSION: Normal appendix.

Colonic diverticula without diverticulitis.

No acute intra-abdominal or pelvic pathology to explain the
patient's symptoms.

## 2022-09-10 ENCOUNTER — Encounter (HOSPITAL_COMMUNITY): Payer: Self-pay

## 2022-09-10 ENCOUNTER — Emergency Department (HOSPITAL_COMMUNITY): Payer: Self-pay

## 2022-09-10 ENCOUNTER — Other Ambulatory Visit: Payer: Self-pay

## 2022-09-10 ENCOUNTER — Emergency Department (HOSPITAL_COMMUNITY)
Admission: EM | Admit: 2022-09-10 | Discharge: 2022-09-11 | Disposition: A | Discharge status: Home / Self Care | Payer: Self-pay | Attending: Emergency Medicine | Admitting: Emergency Medicine

## 2022-09-10 DIAGNOSIS — R42 Dizziness and giddiness: Secondary | ICD-10-CM | POA: Insufficient documentation

## 2022-09-10 DIAGNOSIS — I10 Essential (primary) hypertension: Secondary | ICD-10-CM

## 2022-09-10 DIAGNOSIS — R519 Headache, unspecified: Secondary | ICD-10-CM | POA: Insufficient documentation

## 2022-09-10 DIAGNOSIS — H538 Other visual disturbances: Secondary | ICD-10-CM | POA: Insufficient documentation

## 2022-09-10 LAB — CBC WITH DIFFERENTIAL/PLATELET
Abs Immature Granulocytes: 0.03 10*3/uL (ref 0.00–0.07)
Basophils Absolute: 0.1 10*3/uL (ref 0.0–0.1)
Basophils Relative: 1 %
Eosinophils Absolute: 0.1 10*3/uL (ref 0.0–0.5)
Eosinophils Relative: 1 %
HCT: 44.4 % (ref 39.0–52.0)
Hemoglobin: 14.8 g/dL (ref 13.0–17.0)
Immature Granulocytes: 0 %
Lymphocytes Relative: 31 %
Lymphs Abs: 2.4 10*3/uL (ref 0.7–4.0)
MCH: 31.2 pg (ref 26.0–34.0)
MCHC: 33.3 g/dL (ref 30.0–36.0)
MCV: 93.7 fL (ref 80.0–100.0)
Monocytes Absolute: 0.7 10*3/uL (ref 0.1–1.0)
Monocytes Relative: 9 %
Neutro Abs: 4.5 10*3/uL (ref 1.7–7.7)
Neutrophils Relative %: 58 %
Platelets: 218 10*3/uL (ref 150–400)
RBC: 4.74 MIL/uL (ref 4.22–5.81)
RDW: 13.6 % (ref 11.5–15.5)
WBC: 7.8 10*3/uL (ref 4.0–10.5)
nRBC: 0 % (ref 0.0–0.2)

## 2022-09-10 LAB — BASIC METABOLIC PANEL
Anion gap: 12 (ref 5–15)
BUN: 22 mg/dL — ABNORMAL HIGH (ref 6–20)
CO2: 24 mmol/L (ref 22–32)
Calcium: 9.1 mg/dL (ref 8.9–10.3)
Chloride: 99 mmol/L (ref 98–111)
Creatinine, Ser: 1.14 mg/dL (ref 0.61–1.24)
GFR, Estimated: >60 mL/min (ref >60)
Glucose, Bld: 97 mg/dL (ref 70–99)
Potassium: 4.1 mmol/L (ref 3.5–5.1)
Sodium: 135 mmol/L (ref 135–145)

## 2022-09-10 MED ORDER — KETOROLAC TROMETHAMINE 15 MG/ML IJ SOLN
15.0000 mg | Freq: Once | INTRAMUSCULAR | Status: AC
  Administered 2022-09-11: 15 mg via INTRAVENOUS
  Filled 2022-09-10: qty 1

## 2022-09-10 MED ORDER — METOCLOPRAMIDE HCL 5 MG/ML IJ SOLN
10.0000 mg | INTRAMUSCULAR | Status: AC
  Administered 2022-09-11: 10 mg via INTRAVENOUS
  Filled 2022-09-10: qty 2

## 2022-09-10 MED ORDER — IOHEXOL 350 MG/ML SOLN
75.0000 mL | Freq: Once | INTRAVENOUS | Status: AC | PRN
  Administered 2022-09-10: 75 mL via INTRAVENOUS

## 2022-09-10 MED ORDER — ONDANSETRON HCL 4 MG/2ML IJ SOLN
4.0000 mg | Freq: Once | INTRAMUSCULAR | Status: AC
  Administered 2022-09-10: 4 mg via INTRAVENOUS
  Filled 2022-09-10: qty 2

## 2022-09-10 MED ORDER — MORPHINE SULFATE (PF) 4 MG/ML IV SOLN
4.0000 mg | Freq: Once | INTRAVENOUS | Status: AC
  Administered 2022-09-10: 4 mg via INTRAVENOUS
  Filled 2022-09-10: qty 1

## 2022-09-10 NOTE — ED Provider Triage Note (Signed)
Emergency Medicine Provider Triage Evaluation Note  Brett Sanford , a 31 y.o. male  was evaluated in triage.  He was brought in the waiting room back to triage for reevaluation.  Pt complains of headache.  States that he was standing at work around 11:30 AM when he felt a pop in the right side of his head with associated pain.  No injuries or trauma.  At the same time he had blurry vision, no blindness or loss of vision.  States that he had difficulty speaking.  No loss of vision.  Headache is currently present but not as severe.  No associated vomiting or confusion.  No weakness, numbness, tingling in the arms of the legs.  No anticoagulation.  Denies alcohol use today or drug use.  Review of Systems  Positive: Headache, blurry vision Negative: Vomiting, confusion, fever  Physical Exam  BP (!) 153/71   Pulse (!) 119   Temp 98.2 F (36.8 C) (Oral)   Resp 16   Ht 5\' 8"  (1.727 m)   Wt 90 kg   SpO2 99%   BMI 30.17 kg/m  Gen:   Awake, no distress   Resp:  Normal effort  MSK:   Moves extremities without difficulty  Other:  Normal gross neuro exam, pupils PERRL, speech is clear and fluent  Medical Decision Making  Medically screening exam initiated at 4:39 PM.  Appropriate orders placed.  [place] was informed that the remainder of the evaluation will be completed by another provider, this initial triage assessment does not replace that evaluation, and the importance of remaining in the ED until their evaluation is complete.     Charlesetta Garibaldi, PA-C 09/10/22 1641

## 2022-09-10 NOTE — ED Triage Notes (Signed)
C/o headache with blurred vision and dizziness.  Ems reports htn with bp 170/120 with no hx.

## 2022-09-10 NOTE — ED Notes (Signed)
RN aware of pt BP 

## 2022-09-10 NOTE — ED Provider Notes (Signed)
[facility]-EMERGENCY DEPT Provider Note   CSN: [id number] Arrival date & time: 09/10/22  1222     History  Chief Complaint  Patient presents with   Headache    ANA LIAW is a 31 y.o. male.  The history is provided by the patient and medical records. No language interpreter was used.  Headache    32 year old male was admitted history of recurrent  migraine, polysubstance use including alcohol use, ADD, presenting complaining of headache.  Patient report 9 ago while at work he developed acute onset of pain to his right temple.  He felt a pop follows with intense pain.  At that time he also reported feeling dizzy, having blurry vision and having trouble with his speech.  Symptom is improving but still present.  He did endorse mild nausea without vomiting no fever chills runny nose sneezing or coughing no neck pain no focal numbness or focal weakness or confusion.  He denies any recent drug use or alcohol use.  No recent medication changes.  States this headache felt different from his usual migraine headache.  Home Medications Prior to Admission medications   Medication Sig Start Date End Date Taking? Authorizing Provider  traZODone (DESYREL) 50 MG tablet Take 2 tablets (100 mg total) by mouth at bedtime as needed for sleep. 05/13/20 09/10/20  Jearld Lesch, NP  venlafaxine XR (EFFEXOR XR) 37.5 MG 24 hr capsule Take 1 capsule (37.5 mg total) by mouth 2 (two) times daily. Take one tablet by mouth in the am and one tablet by mouth in the evening 05/13/20 09/10/20  Jearld Lesch, NP      Allergies    Benadryl [diphenhydramine hcl]    Review of Systems   Review of Systems  Neurological:  Positive for headaches.  All other systems reviewed and are negative.   Physical Exam Updated Vital Signs BP (!) 170/107   Pulse 87   Temp 98 F (36.7 C)   Resp 18   Ht 5\' 8"  (1.727 m)   Wt 90 kg   SpO2 100%   BMI 30.17 kg/m  Physical Exam Vitals and nursing  note reviewed.  Constitutional:      General: He is not in acute distress.    Appearance: He is well-developed.  HENT:     Head: Normocephalic and atraumatic.  Eyes:     Extraocular Movements: Extraocular movements intact.     Conjunctiva/sclera: Conjunctivae normal.     Pupils: Pupils are equal, round, and reactive to light.  Cardiovascular:     Rate and Rhythm: Normal rate and regular rhythm.     Pulses: Normal pulses.     Heart sounds: Normal heart sounds.  Pulmonary:     Effort: Pulmonary effort is normal.     Breath sounds: Normal breath sounds.  Abdominal:     Palpations: Abdomen is soft.  Musculoskeletal:     Cervical back: Normal range of motion and neck supple.  Skin:    Findings: No rash.  Neurological:     Mental Status: He is alert.     GCS: GCS eye subscore is 4. GCS verbal subscore is 5. GCS motor subscore is 6.     Cranial Nerves: Cranial nerves 2-12 are intact.     Sensory: Sensation is intact.     Motor: Motor function is intact.     Coordination: Coordination is intact.     Gait: Gait is intact.     ED Results / Procedures /  Treatments   Labs (all labs ordered are listed, but only abnormal results are displayed) Labs Reviewed  BASIC METABOLIC PANEL - Abnormal; Notable for the following components:      Result Value   BUN 22 (*)    All other components within normal limits  CBC WITH DIFFERENTIAL/PLATELET    EKG None  Radiology CT HEAD WO CONTRAST ( )  Result Date: 09/10/2022 CLINICAL DATA:  Headache, sudden, severe EXAM: CT HEAD WITHOUT CONTRAST TECHNIQUE: Contiguous axial images were obtained from the base of the skull through the vertex without intravenous contrast. RADIATION DOSE REDUCTION: This exam was performed according to the departmental dose-optimization program which includes automated exposure control, adjustment of the mA and/or kV according to patient size and/or use of iterative reconstruction technique. COMPARISON:  06/12/2019  FINDINGS: Brain: No evidence of acute infarction, hemorrhage, hydrocephalus, extra-axial collection or mass lesion/mass effect. Vascular: No hyperdense vessel or unexpected calcification. Skull: Normal. Negative for fracture or focal lesion. Sinuses/Orbits: No acute finding. IMPRESSION: No acute intracranial process. Electronically Signed   By: Layla Maw M.D.   On: 09/10/2022 17:39    Procedures Procedures    Medications Ordered in ED Medications  morphine (PF) 4 MG/ML injection 4 mg (4 mg Intravenous Given 09/10/22 2039)  ondansetron (ZOFRAN) injection 4 mg (4 mg Intravenous Given 09/10/22 2038)  iohexol (OMNIPAQUE) 350 MG/ML injection 75 mL (75 mLs Intravenous Contrast Given 09/10/22 2153)    ED Course/ Medical Decision Making/ A&P                           Medical Decision Making Amount and/or Complexity of Data Reviewed Labs: ordered. Radiology: ordered.  Risk Prescription drug management.   BP (!) 170/107   Pulse 87   Temp 98 F (36.7 C)   Resp 18   Ht 5\' 8"  (1.727 m)   Wt 90 kg   SpO2 100%   BMI 30.17 kg/m   40:70 PM 31 year old male was admitted history of recurrent  migraine, polysubstance use including alcohol use, ADD, presenting complaining of headache.  Patient report 9 ago while at work he developed acute onset of pain to his right temple.  He felt a pop follows with intense pain.  At that time he also reported feeling dizzy, having blurry vision and having trouble with his speech.  Symptom is improving but still present.  He did endorse mild nausea without vomiting no fever chills runny nose sneezing or coughing no neck pain no focal numbness or focal weakness or confusion.  He denies any recent drug use or alcohol use.  No recent medication changes.  States this headache felt different from his usual migraine headache.  On exam, patient is sitting in bed well-appearing appears to be in no acute discomfort.  He does not have any focal neurodeficit on exam.  He  is mentating appropriately.  No carotid bruit noted.  No palpable tenderness about the scalp of forehead.  Vital signs remarkable for elevated blood pressure of 170/107.  He is afebrile, no hypoxia.  -Labs ordered, independently viewed and interpreted by me.  Labs remarkable for reassuring lab -The patient was maintained on a cardiac monitor.  I personally viewed and interpreted the cardiac monitored which showed an underlying rhythm of: NSR -Imaging independently viewed and interpreted by me and I agree with radiologist's interpretation.  Result remarkable for head CT without acute finding.  Head CTA ordered to r/o dissection -This patient presents to the  ED for concern of headache, this involves an extensive number of treatment options, and is a complaint that carries with it a high risk of complications and morbidity.  The differential diagnosis includes migraine, tension headache, cluster headache, dissection, dehydration, SAH -Co morbidities that complicate the patient evaluation includes migraine -Treatment includes morphine, zofran -Reevaluation of the patient after these medicines showed that the patient improved -PCP office notes or outside notes reviewed -Discussion with oncoming provider who will f/u on CT result and reassess          Final Clinical Impression(s) / ED Diagnoses Final diagnoses:  None    Rx / DC Orders ED Discharge Orders     None         Fayrene Helper, PA-C 09/10/22 2209    Bethann Berkshire, MD 09/12/22 1104

## 2022-09-11 NOTE — ED Provider Notes (Signed)
Clinical Course as of 09/11/22 0130  Sat Sep 10, 2022  2227 CTA head normal. Plan to reassess patient for symptomatic improvement. [KH]  2251 Pain down to 5/10 from 8/10. Additional medications ordered for headache management. [KH]  Sun Sep 11, 2022  0049 Patient's blood pressure, while elevated, has been stable.  On chart review, patient has had similar readings in the past in various situations of stress (psychological and physical). Suspect BP readings to be pain-mediated. Have discussed with patient need to f/u with his PCP for BP recheck. [KH]  0129 Patient reassessed.  He reports continued improvement in his symptoms.  Expresses comfort with discharge and primary care follow-up. [KH]    Clinical Course User Index [KH] Antony Madura, PA-C   CT Angio Head W or Wo Contrast  Result Date: 09/10/2022 CLINICAL DATA:  Thunderclap headache EXAM: CT ANGIOGRAPHY HEAD TECHNIQUE: Multidetector CT imaging of the head was performed using the standard protocol during bolus administration of intravenous contrast. Multiplanar CT image reconstructions and MIPs were obtained to evaluate the vascular anatomy. RADIATION DOSE REDUCTION: This exam was performed according to the departmental dose-optimization program which includes automated exposure control, adjustment of the mA and/or kV according to patient size and/or use of iterative reconstruction technique. CONTRAST:  39mL OMNIPAQUE IOHEXOL 350 MG/ML SOLN COMPARISON:  09/10/2022 head FINDINGS: POSTERIOR CIRCULATION: --Vertebral arteries: Normal --Inferior cerebellar arteries: Normal. --Basilar artery: Normal. --Superior cerebellar arteries: Normal. --Posterior cerebral arteries: Normal. ANTERIOR CIRCULATION: --Intracranial internal carotid arteries: Normal. --Anterior cerebral arteries (ACA): Normal. --Middle cerebral arteries (MCA): Normal. ANATOMIC VARIANTS: None Review of the MIP images confirms the above findings. IMPRESSION: Normal CTA of the head.  Electronically Signed   By: Deatra Robinson M.D.   On: 09/10/2022 22:24   CT HEAD WO CONTRAST ( )  Result Date: 09/10/2022 CLINICAL DATA:  Headache, sudden, severe EXAM: CT HEAD WITHOUT CONTRAST TECHNIQUE: Contiguous axial images were obtained from the base of the skull through the vertex without intravenous contrast. RADIATION DOSE REDUCTION: This exam was performed according to the departmental dose-optimization program which includes automated exposure control, adjustment of the mA and/or kV according to patient size and/or use of iterative reconstruction technique. COMPARISON:  06/12/2019 FINDINGS: Brain: No evidence of acute infarction, hemorrhage, hydrocephalus, extra-axial collection or mass lesion/mass effect. Vascular: No hyperdense vessel or unexpected calcification. Skull: Normal. Negative for fracture or focal lesion. Sinuses/Orbits: No acute finding. IMPRESSION: No acute intracranial process. Electronically Signed   By: Layla Maw M.D.   On: 09/10/2022 17:39      Antony Madura, PA-C 09/11/22 0130    Tilden Fossa, MD 09/11/22 [phone]

## 2022-09-11 NOTE — Discharge Instructions (Signed)
Your imaging in the ED was reassuring.  We recommend follow-up with your primary care doctor for recheck of your blood pressure within the week.  Return for new or concerning symptoms.

## 2022-10-28 ENCOUNTER — Ambulatory Visit: Payer: Medicaid Other | Admitting: Family Medicine

## 2022-12-16 ENCOUNTER — Ambulatory Visit: Payer: Medicaid Other | Admitting: Family Medicine

## 2023-02-17 ENCOUNTER — Ambulatory Visit: Payer: Medicaid Other | Admitting: Family Medicine

## 2023-07-31 ENCOUNTER — Ambulatory Visit (HOSPITAL_COMMUNITY)
Admission: EM | Admit: 2023-07-31 | Discharge: 2023-07-31 | Disposition: A | Discharge status: Other Acute Inpatient Hospital | Payer: No Payment, Other | Attending: Psychiatry | Admitting: Psychiatry

## 2023-07-31 DIAGNOSIS — R251 Tremor, unspecified: Secondary | ICD-10-CM | POA: Insufficient documentation

## 2023-07-31 DIAGNOSIS — F332 Major depressive disorder, recurrent severe without psychotic features: Secondary | ICD-10-CM | POA: Diagnosis not present

## 2023-07-31 DIAGNOSIS — F1020 Alcohol dependence, uncomplicated: Secondary | ICD-10-CM | POA: Diagnosis not present

## 2023-07-31 DIAGNOSIS — F1090 Alcohol use, unspecified, uncomplicated: Secondary | ICD-10-CM

## 2023-07-31 DIAGNOSIS — F322 Major depressive disorder, single episode, severe without psychotic features: Secondary | ICD-10-CM | POA: Diagnosis present

## 2023-07-31 LAB — LIPID PANEL
Cholesterol: 317 mg/dL — ABNORMAL HIGH (ref 0–200)
HDL: 110 mg/dL (ref >40)
LDL Cholesterol: 159 mg/dL — ABNORMAL HIGH (ref 0–99)
Total CHOL/HDL Ratio: 2.9 {ratio}
Triglycerides: 241 mg/dL — ABNORMAL HIGH (ref <150)
VLDL: 48 mg/dL — ABNORMAL HIGH (ref 0–40)

## 2023-07-31 LAB — POCT URINE DRUG SCREEN - MANUAL ENTRY (I-SCREEN)
POC Amphetamine UR: NOT DETECTED
POC Buprenorphine (BUP): NOT DETECTED
POC Cocaine UR: NOT DETECTED
POC Marijuana UR: POSITIVE — AB
POC Methadone UR: NOT DETECTED
POC Methamphetamine UR: POSITIVE — AB
POC Morphine: NOT DETECTED
POC Oxazepam (BZO): NOT DETECTED
POC Oxycodone UR: NOT DETECTED
POC Secobarbital (BAR): NOT DETECTED

## 2023-07-31 LAB — CBC WITH DIFFERENTIAL/PLATELET
Abs Immature Granulocytes: 0.05 10*3/uL (ref 0.00–0.07)
Basophils Absolute: 0.1 10*3/uL (ref 0.0–0.1)
Basophils Relative: 1 %
Eosinophils Absolute: 0.1 10*3/uL (ref 0.0–0.5)
Eosinophils Relative: 1 %
HCT: 49.5 % (ref 39.0–52.0)
Hemoglobin: 17 g/dL (ref 13.0–17.0)
Immature Granulocytes: 1 %
Lymphocytes Relative: 19 %
Lymphs Abs: 1.4 10*3/uL (ref 0.7–4.0)
MCH: 32.3 pg (ref 26.0–34.0)
MCHC: 34.3 g/dL (ref 30.0–36.0)
MCV: 94.1 fL (ref 80.0–100.0)
Monocytes Absolute: 0.7 10*3/uL (ref 0.1–1.0)
Monocytes Relative: 10 %
Neutro Abs: 5 10*3/uL (ref 1.7–7.7)
Neutrophils Relative %: 68 %
Platelets: 229 10*3/uL (ref 150–400)
RBC: 5.26 MIL/uL (ref 4.22–5.81)
RDW: 13.2 % (ref 11.5–15.5)
WBC: 7.3 10*3/uL (ref 4.0–10.5)
nRBC: 0 % (ref 0.0–0.2)

## 2023-07-31 LAB — COMPREHENSIVE METABOLIC PANEL
ALT: 176 U/L — ABNORMAL HIGH (ref 0–44)
AST: 468 U/L — ABNORMAL HIGH (ref 15–41)
Albumin: 4.3 g/dL (ref 3.5–5.0)
Alkaline Phosphatase: 92 U/L (ref 38–126)
Anion gap: 19 — ABNORMAL HIGH (ref 5–15)
BUN: 10 mg/dL (ref 6–20)
CO2: 18 mmol/L — ABNORMAL LOW (ref 22–32)
Calcium: 9.2 mg/dL (ref 8.9–10.3)
Chloride: 102 mmol/L (ref 98–111)
Creatinine, Ser: 1.02 mg/dL (ref 0.61–1.24)
GFR, Estimated: >60 mL/min (ref >60)
Glucose, Bld: 115 mg/dL — ABNORMAL HIGH (ref 70–99)
Potassium: 3.8 mmol/L (ref 3.5–5.1)
Sodium: 139 mmol/L (ref 135–145)
Total Bilirubin: 3 mg/dL — ABNORMAL HIGH (ref 0.3–1.2)
Total Protein: 7.4 g/dL (ref 6.5–8.1)

## 2023-07-31 LAB — TSH: TSH: 2.721 u[IU]/mL (ref 0.350–4.500)

## 2023-07-31 LAB — MAGNESIUM: Magnesium: 1.8 mg/dL (ref 1.7–2.4)

## 2023-07-31 LAB — HEMOGLOBIN A1C
Hgb A1c MFr Bld: 4.9 % (ref 4.8–5.6)
Mean Plasma Glucose: 93.93 mg/dL

## 2023-07-31 LAB — ETHANOL: Alcohol, Ethyl (B): 147 mg/dL — ABNORMAL HIGH (ref <10)

## 2023-07-31 MED ORDER — MAGNESIUM HYDROXIDE 400 MG/5ML PO SUSP: 30.0000 mL | Freq: Every day | ORAL | Status: DC | PRN

## 2023-07-31 MED ORDER — TRAZODONE HCL 50 MG PO TABS: 50.0000 mg | ORAL_TABLET | Freq: Every evening | ORAL | Status: DC | PRN

## 2023-07-31 MED ORDER — LORAZEPAM 1 MG PO TABS: 1.0000 mg | ORAL_TABLET | Freq: Two times a day (BID) | ORAL | Status: DC

## 2023-07-31 MED ORDER — PANTOPRAZOLE SODIUM 40 MG PO TBEC
40.0000 mg | DELAYED_RELEASE_TABLET | Freq: Every day | ORAL | Status: DC
  Administered 2023-07-31: 40 mg via ORAL
  Filled 2023-07-31: qty 1

## 2023-07-31 MED ORDER — LORAZEPAM 1 MG PO TABS
1.0000 mg | ORAL_TABLET | Freq: Four times a day (QID) | ORAL | Status: AC
  Administered 2023-07-31 (×3): 1 mg via ORAL
  Filled 2023-07-31 (×4): qty 1

## 2023-07-31 MED ORDER — PANTOPRAZOLE SODIUM 20 MG PO TBEC: 20.0000 mg | DELAYED_RELEASE_TABLET | Freq: Every day | ORAL | Status: DC

## 2023-07-31 MED ORDER — HYDROXYZINE HCL 25 MG PO TABS
25.0000 mg | ORAL_TABLET | Freq: Four times a day (QID) | ORAL | Status: DC | PRN
  Administered 2023-07-31 (×2): 25 mg via ORAL
  Filled 2023-07-31 (×2): qty 1

## 2023-07-31 MED ORDER — ADULT MULTIVITAMIN W/MINERALS CH
1.0000 | ORAL_TABLET | Freq: Every day | ORAL | Status: DC
  Administered 2023-07-31: 1 via ORAL
  Filled 2023-07-31: qty 1

## 2023-07-31 MED ORDER — ALUM & MAG HYDROXIDE-SIMETH 200-200-20 MG/5ML PO SUSP: 30.0000 mL | ORAL | Status: DC | PRN

## 2023-07-31 MED ORDER — LOPERAMIDE HCL 2 MG PO CAPS: 2.0000 mg | ORAL_CAPSULE | ORAL | Status: DC | PRN

## 2023-07-31 MED ORDER — ACETAMINOPHEN 325 MG PO TABS: 650.0000 mg | ORAL_TABLET | Freq: Four times a day (QID) | ORAL | Status: DC | PRN

## 2023-07-31 MED ORDER — LORAZEPAM 1 MG PO TABS
1.0000 mg | ORAL_TABLET | Freq: Four times a day (QID) | ORAL | Status: DC | PRN
  Administered 2023-07-31: 1 mg via ORAL
  Filled 2023-07-31: qty 1

## 2023-07-31 MED ORDER — LORAZEPAM 1 MG PO TABS: 1.0000 mg | ORAL_TABLET | Freq: Every day | ORAL | Status: DC

## 2023-07-31 MED ORDER — ONDANSETRON 4 MG PO TBDP
4.0000 mg | ORAL_TABLET | Freq: Four times a day (QID) | ORAL | Status: DC | PRN
  Administered 2023-07-31: 4 mg via ORAL
  Filled 2023-07-31: qty 1

## 2023-07-31 MED ORDER — LORAZEPAM 1 MG PO TABS: 1.0000 mg | ORAL_TABLET | Freq: Three times a day (TID) | ORAL | Status: DC

## 2023-07-31 MED ORDER — THIAMINE HCL 100 MG/ML IJ SOLN
100.0000 mg | Freq: Once | INTRAMUSCULAR | Status: AC
  Administered 2023-07-31: 100 mg via INTRAMUSCULAR
  Filled 2023-07-31: qty 2

## 2023-07-31 MED ORDER — THIAMINE MONONITRATE 100 MG PO TABS: 100.0000 mg | ORAL_TABLET | Freq: Every day | ORAL | Status: DC

## 2023-07-31 NOTE — ED Notes (Signed)
Patient resting in bed. Upon awakening, patient observed with moderate tremors, proximal sweats, and anxiousness with c/o abdominal discomfort, nausea w/o emesis. Ativan administered. Patient declined nourishment. CIWA completed with improved score of 14.

## 2023-07-31 NOTE — Progress Notes (Signed)
Pt was accepted to [facility] TODAY 07/31/2023, pending voluntary consent faxed to [phone]. Bed assignment: 307  Pt meets inpatient criteria per Vernard Gambles, NP  Attending Physician will be Elane Fritz, DO  Report can be called to: [phone]  Pt can arrive after pending items are received  Care Team Notified: Rona Ravens, RN, Vernard Gambles, NP, Percell Miller, RN, Mayer Camel, RN, Doyce Para, RN, and Durwin Reges, RN  Cathie Beams, MSW, LCSW  07/31/2023 2:14 PM

## 2023-07-31 NOTE — Discharge Instructions (Addendum)
transfer patient to [facility] for inpatient psychiatric admission

## 2023-07-31 NOTE — Progress Notes (Signed)
   07/31/23 0955  [facility] Triage Screening (Walk-ins at [facility] only)  How Did You Hear About [place]? Self  What Is the Reason for Your Visit/Call Today? Brett Sanford is a 32 year old male presenting to [facility] voluntarily unaccompanied. Pt reports that he lost his job, broke up with his partner recently, and his housing situation is not stable. Pt mentions that he feels like a "disappointment". Pt does report to have Severe Depression and ADHD. Pt endorses SI at this time. Pt begins to express how he has had a past suicide attempt 4 years ago by attempting to overdose on over-the-counter sleeping pills. Pt reports all he can recall from that time was waking up in a hospital. Pt does report to have thoughts of wanting to harm himself at this time. Pt also reports having a plan to commit suicide by attempting to overdose on "sleeping pills". Pt reports it is possible that if he were to leave here today, he could potentially attempt to end his life. Pt does not currently have access to any weapons at his home. Pt reports he has a hx of marijuana and alcohol use. Pt reports he drank "enough" to keep him from going into withdrawls last night. Pt mentions he drinks a pint daily. Pt also mentions he has been drinking heavily for over a month. Pt is currently not taking any medications at this time. Pt is looking for any resources to help him with his depression, SI, and alcohol problem. Pt is also open to being admitted for inpatient treatment. Pt denies HI and AVH.  How Long Has This Been Causing You Problems? 1 wk - 1 month  Have You Recently Had Any Thoughts About Hurting Yourself? Yes  How long ago did you have thoughts about hurting yourself? 1 month  Are You Planning to Commit Suicide/Harm Yourself At This time? Yes  Have you Recently Had Thoughts About Hurting Someone Brett Sanford? No  Are You Planning To Harm Someone At This Time? No  Are you currently experiencing any auditory, visual or other hallucinations? No  Do you  have any current medical co-morbidities that require immediate attention? No  Clinician description of patient physical appearance/behavior: cooperative, emotional  What Do You Feel Would Help You the Most Today? Treatment for Depression or other mood problem;Alcohol or Drug Use Treatment  If access to [facility] was not available, would you have sought care in the Emergency Department? No  Determination of Need Urgent (48 hours)  Options For Referral Intensive Outpatient Therapy;Medication Management;Inpatient Hospitalization

## 2023-07-31 NOTE — Progress Notes (Signed)
[facility] LCSW Progress Note   07/31/2023    10:28 PM  [name]   [phone]   Type of Contact and Topic:  Psychiatric Bed Placement   Pt accepted to [facility] Room 307    Patient meets inpatient criteria per Vernard Gambles, NP    The attending provider will be Dr. Renato Shin  Call report to [phone]  Adrian Prows, RN @ [facility] notified.     Pt scheduled  to arrive at [facility]. The bed is currently ready.    Damita Dunnings, MSW, LCSW-A  10:30 PM 07/31/2023

## 2023-07-31 NOTE — ED Provider Notes (Signed)
Behavioral Health Urgent Care Medical Screening Exam  Patient Name: Brett Sanford MRN: [id number] Date of Evaluation: 07/31/23 Chief Complaint:  increased depression and SI Diagnosis:  Final diagnoses:  Severe episode of recurrent major depressive disorder, without psychotic features (HCC)  Alcohol use disorder    History of Present illness: Brett Sanford is a 32 y.o. male patient presented to [facility] as a walk in unaccompanied with complaints of increased depression and SI.  Charlesetta Garibaldi, 32 y.o., male patient seen face to face by this provider, consulted with Dr. Lucianne Muss; and chart reviewed on 07/31/23.  Reports a past psychiatric history of MDD, ADHD, and alcohol use disorder.  He reports 1 psychiatric admission after a suicide attempt.  On evaluation Brett Sanford reports over the past month he has lost his job, broke up with his partner, and has unstable housing.  He has resided with his mother over the past 4 years since his suicide attempt.  Over the past month he has experienced an increase in his depression with feelings of helplessness, hopelessness, tearfulness, agitation, decreased focus, decreased appetite and sleep.  He has also began to have intrusive suicidal thoughts.  He has a plan to possibly overdose on medications.  He verbally cannot contract for safety.  He is also been drinking alcohol daily.  He drinks roughly 1 pint a day of liquor.  His last drink was around 8:30 AM today.  He denies any history of alcohol withdrawal seizure but does endorse a history of delirium tremens.  He endorses current withdrawal symptoms as shaking, sweating, nausea, and vomiting.  He also endorses marijuana use daily with his last use being last night.  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  He presents requesting psychiatric admission due to suicidal thoughts and needing alcohol detox.  During evaluation Brett Sanford is casually dressed and is observed  sitting in the assessment room.  He is alert/oriented x 4, cooperative, and attentive.  He has normal speech and behavior.  He has a visible shake and hand tremor.  He denies homicidal ideations.  He denies auditory/visual hallucinations.  Objectively there is no evidence of psychosis, mania, or paranoia.  He is able to converse coherently with goal-directed thoughts and no distractibility or preoccupation.  Discussed inpatient psychiatric admission and patient is in agreement.  Patient is not sure if he would like to restart medications for depression or not.  States he has tried Prozac Zoloft, Effexor and possibly Lexapro in the past and felt they were ineffective.  Flowsheet Row ED from 07/31/2023 in [facility] ED from 09/10/2022 in [facility] Emergency Department [facility] (Discharged[facility]  C-SSRS RISK CATEGORY Moderate Risk No Risk High Risk       Psychiatric Specialty Exam  Presentation  General Appearance:Casual  Eye Contact:Good  Speech:Clear and Coherent; Normal Rate  Speech Volume:Normal  Handedness:Right   Mood and Affect  Mood: Anxious; Depressed; Hopeless  Affect: Congruent   Thought Process  Thought Processes: Coherent  Descriptions of Associations:Intact  Orientation:Full (Time, Place and Person)  Thought Content:Logical  Diagnosis of Schizophrenia or Schizoaffective disorder in past: No   Hallucinations:None  Ideas of Reference:None  Suicidal Thoughts:Yes, Active With Intent; With Plan; With Means to Carry Out  Homicidal Thoughts:No   Sensorium  Memory: Immediate Good; Recent Good; Remote Good  Judgment: Fair  Insight: Curator  Functions  Concentration: Good  Attention Span: Good  Recall: Good  Fund of Knowledge: Good  Language: Good   Psychomotor Activity  Psychomotor Activity: Normal   Assets   Assets: Communication Skills; Desire for Improvement; Physical Health; Leisure Time   Sleep  Sleep: Poor  Number of hours: No data recorded  Physical Exam: Physical Exam Vitals and nursing note reviewed.  Constitutional:      Appearance: Normal appearance.  HENT:     Head: Normocephalic.     Right Ear: External ear normal.     Left Ear: External ear normal.  Eyes:     General:        Right eye: No discharge.        Left eye: No discharge.     Conjunctiva/sclera: Conjunctivae normal.  Cardiovascular:     Rate and Rhythm: Normal rate.  Pulmonary:     Effort: Pulmonary effort is normal. No respiratory distress.  Musculoskeletal:        General: Normal range of motion.     Cervical back: Normal range of motion.  Skin:    Coloration: Skin is not jaundiced or pale.  Neurological:     Mental Status: He is alert and oriented to person, place, and time.  Psychiatric:        Attention and Perception: Attention and perception normal.        Mood and Affect: Mood is depressed.        Speech: Speech normal.        Behavior: Behavior is cooperative.        Thought Content: Thought content includes suicidal ideation. Thought content includes suicidal plan.        Cognition and Memory: Cognition normal.        Judgment: Judgment is impulsive.    Review of Systems  Constitutional: Negative.   HENT: Negative.    Eyes: Negative.   Respiratory: Negative.    Cardiovascular: Negative.   Gastrointestinal:  Positive for nausea.  Musculoskeletal: Negative.   Skin: Negative.   Neurological:  Positive for tremors.  Psychiatric/Behavioral:  Positive for depression, substance abuse and suicidal ideas. The patient is nervous/anxious.    Blood pressure (!) 152/98, pulse (!) 104, temperature 98.5 F (36.9 C), temperature source Oral, resp. rate 20, SpO2 100%. There is no height or weight on file to calculate BMI.  Musculoskeletal: Strength & Muscle Tone: within normal limits Gait &  Station: normal Patient leans: N/A   [facility] MSE Discharge Disposition for Follow up and Recommendations: Based on my evaluation I certify that psychiatric inpatient services furnished can reasonably be expected to improve the patient's condition which I recommend transfer to an appropriate accepting facility.   Patient meets criteria for inpatient psychiatric admission.  [facility] notified and patient has been accepted to [facility].  Medications:  CIWA protocol with Ativan taper with additional Ativan 1 mg every 6 hours for CIWA score greater than 10 Home medication restarted- prilosec 20 mg daily (he takes no other home medications)  Lab Orders         CBC with Differential/Platelet         Comprehensive metabolic panel         Hemoglobin A1c         Magnesium         Ethanol         Lipid panel         TSH         POCT Urine  Drug Screen - (I-Screen)      EKG   Ardis Hughs, NP 07/31/2023, 5:01 PM
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
## 2023-07-31 NOTE — ED Notes (Signed)
Patients B/P 144/88 P-110. Patient continues to have upper extremity gross tremors, decrease in skin moisture and piloerection. Patient reports Nausea is coming in waves w/o sensation to vomit.

## 2023-08-01 ENCOUNTER — Encounter: Payer: Self-pay | Admitting: Psychiatry

## 2023-08-01 ENCOUNTER — Other Ambulatory Visit: Payer: Self-pay

## 2023-08-01 ENCOUNTER — Inpatient Hospital Stay
Admission: AD | Admit: 2023-08-01 | Discharge: 2023-08-06 | DRG: 885 | Disposition: A | Discharge status: Home / Self Care | Payer: 59 | Source: Intra-hospital | Attending: Psychiatry | Admitting: Psychiatry

## 2023-08-01 DIAGNOSIS — Z5971 Insufficient health insurance coverage: Secondary | ICD-10-CM | POA: Diagnosis not present

## 2023-08-01 DIAGNOSIS — F1020 Alcohol dependence, uncomplicated: Secondary | ICD-10-CM | POA: Diagnosis present

## 2023-08-01 DIAGNOSIS — F17210 Nicotine dependence, cigarettes, uncomplicated: Secondary | ICD-10-CM | POA: Diagnosis present

## 2023-08-01 DIAGNOSIS — Z5986 Financial insecurity: Secondary | ICD-10-CM

## 2023-08-01 DIAGNOSIS — R45851 Suicidal ideations: Secondary | ICD-10-CM | POA: Diagnosis present

## 2023-08-01 DIAGNOSIS — F419 Anxiety disorder, unspecified: Secondary | ICD-10-CM | POA: Diagnosis present

## 2023-08-01 DIAGNOSIS — K219 Gastro-esophageal reflux disease without esophagitis: Secondary | ICD-10-CM | POA: Diagnosis present

## 2023-08-01 DIAGNOSIS — F332 Major depressive disorder, recurrent severe without psychotic features: Principal | ICD-10-CM | POA: Diagnosis present

## 2023-08-01 DIAGNOSIS — F322 Major depressive disorder, single episode, severe without psychotic features: Principal | ICD-10-CM | POA: Diagnosis present

## 2023-08-01 DIAGNOSIS — Z9151 Personal history of suicidal behavior: Secondary | ICD-10-CM

## 2023-08-01 DIAGNOSIS — G4700 Insomnia, unspecified: Secondary | ICD-10-CM | POA: Diagnosis present

## 2023-08-01 DIAGNOSIS — Z560 Unemployment, unspecified: Secondary | ICD-10-CM

## 2023-08-01 DIAGNOSIS — Z79899 Other long term (current) drug therapy: Secondary | ICD-10-CM

## 2023-08-01 MED ORDER — HYDROXYZINE HCL 25 MG PO TABS
25.0000 mg | ORAL_TABLET | Freq: Four times a day (QID) | ORAL | Status: AC | PRN
  Administered 2023-08-01 – 2023-08-03 (×4): 25 mg via ORAL
  Filled 2023-08-01 (×4): qty 1

## 2023-08-01 MED ORDER — LORAZEPAM 1 MG PO TABS
1.0000 mg | ORAL_TABLET | Freq: Two times a day (BID) | ORAL | Status: AC
  Administered 2023-08-02 (×2): 1 mg via ORAL
  Filled 2023-08-01 (×2): qty 1

## 2023-08-01 MED ORDER — MAGNESIUM HYDROXIDE 400 MG/5ML PO SUSP
30.0000 mL | Freq: Every day | ORAL | Status: DC | PRN
  Filled 2023-08-01: qty 30

## 2023-08-01 MED ORDER — LORAZEPAM 1 MG PO TABS: 1.0000 mg | ORAL_TABLET | Freq: Four times a day (QID) | ORAL | Status: AC | PRN

## 2023-08-01 MED ORDER — LORAZEPAM 1 MG PO TABS
1.0000 mg | ORAL_TABLET | Freq: Three times a day (TID) | ORAL | Status: AC
  Administered 2023-08-01 (×3): 1 mg via ORAL
  Filled 2023-08-01 (×3): qty 1

## 2023-08-01 MED ORDER — LORAZEPAM 1 MG PO TABS
1.0000 mg | ORAL_TABLET | Freq: Every day | ORAL | Status: AC
  Administered 2023-08-03: 1 mg via ORAL
  Filled 2023-08-01: qty 1

## 2023-08-01 MED ORDER — LOPERAMIDE HCL 2 MG PO CAPS: 2.0000 mg | ORAL_CAPSULE | ORAL | Status: AC | PRN

## 2023-08-01 MED ORDER — THIAMINE MONONITRATE 100 MG PO TABS
100.0000 mg | ORAL_TABLET | Freq: Every day | ORAL | Status: DC
  Administered 2023-08-01 – 2023-08-06 (×6): 100 mg via ORAL
  Filled 2023-08-01 (×6): qty 1

## 2023-08-01 MED ORDER — LORAZEPAM 2 MG PO TABS
2.0000 mg | ORAL_TABLET | Freq: Three times a day (TID) | ORAL | Status: DC | PRN
Start: 2023-08-01 — End: 2023-08-04

## 2023-08-01 MED ORDER — HALOPERIDOL LACTATE 5 MG/ML IJ SOLN: 5.0000 mg | Freq: Three times a day (TID) | INTRAMUSCULAR | Status: DC | PRN

## 2023-08-01 MED ORDER — PANTOPRAZOLE SODIUM 40 MG PO TBEC
40.0000 mg | DELAYED_RELEASE_TABLET | Freq: Every day | ORAL | Status: DC
  Administered 2023-08-01 – 2023-08-06 (×6): 40 mg via ORAL
  Filled 2023-08-01 (×6): qty 1

## 2023-08-01 MED ORDER — ONDANSETRON 4 MG PO TBDP: 4.0000 mg | ORAL_TABLET | Freq: Four times a day (QID) | ORAL | Status: AC | PRN

## 2023-08-01 MED ORDER — ACETAMINOPHEN 325 MG PO TABS
650.0000 mg | ORAL_TABLET | Freq: Four times a day (QID) | ORAL | Status: DC | PRN
  Administered 2023-08-02 – 2023-08-06 (×12): 650 mg via ORAL
  Filled 2023-08-01 (×12): qty 2

## 2023-08-01 MED ORDER — ADULT MULTIVITAMIN W/MINERALS CH
1.0000 | ORAL_TABLET | Freq: Every day | ORAL | Status: DC
  Administered 2023-08-01 – 2023-08-06 (×6): 1 via ORAL
  Filled 2023-08-01 (×6): qty 1

## 2023-08-01 MED ORDER — ALUM & MAG HYDROXIDE-SIMETH 200-200-20 MG/5ML PO SUSP: 30.0000 mL | ORAL | Status: DC | PRN

## 2023-08-01 MED ORDER — HALOPERIDOL 5 MG PO TABS: 5.0000 mg | ORAL_TABLET | Freq: Three times a day (TID) | ORAL | Status: DC | PRN

## 2023-08-01 MED ORDER — TRAZODONE HCL 50 MG PO TABS
50.0000 mg | ORAL_TABLET | Freq: Every evening | ORAL | Status: DC | PRN
  Administered 2023-08-02 – 2023-08-05 (×4): 50 mg via ORAL
  Filled 2023-08-01 (×4): qty 1

## 2023-08-01 MED ORDER — LORAZEPAM 2 MG/ML IJ SOLN
2.0000 mg | Freq: Three times a day (TID) | INTRAMUSCULAR | Status: DC | PRN
Start: 2023-08-01 — End: 2023-08-04

## 2023-08-01 NOTE — Progress Notes (Signed)
   08/01/23 1024  Charting Type  Charting Type Shift assessment  Safety Check Verification  Has the RN verified the 15 minute safety check completion? Yes  Neurological  Neuro (WDL) WDL  Neuro Symptoms Anxiety;Tremors  HEENT  HEENT (WDL) WDL  Respiratory  Respiratory (WDL) WDL  Cardiac  Cardiac (WDL) WDL  ECG Monitor No  Vascular  Vascular (WDL) WDL  Integumentary  Integumentary (WDL) WDL  Braden Scale (Ages 8 and up)  Sensory Perceptions 4  Moisture 4  Activity 4  Mobility 4  Nutrition 3  Friction and Shear 3  Braden Scale Score 22  Musculoskeletal  Musculoskeletal (WDL) WDL  Gastrointestinal  Gastrointestinal (WDL) WDL  Last BM Date  07/31/23  GU Assessment  Genitourinary (WDL) WDL  Neurological  Level of Consciousness Alert

## 2023-08-01 NOTE — Progress Notes (Deleted)
   08/01/23 1028  Psych Admission Type (Psych Patients Only)  Admission Status Voluntary  Psychosocial Assessment  Patient Complaints Worrying;Depression;Anxiety  Eye Contact Fair  Facial Expression Worried;Anxious  Affect Anxious  Speech Soft  Interaction Assertive  Motor Activity Tremors  Appearance/Hygiene Poor hygiene  Behavior Characteristics Cooperative;Anxious  Mood Anxious  Thought Process  Coherency WDL  Content WDL  Delusions None reported or observed  Perception WDL  Hallucination None reported or observed  Judgment Impaired  Confusion None  Danger to Self  Current suicidal ideation? Passive  Self-Injurious Behavior No self-injurious ideation or behavior indicators observed or expressed   Agreement Not to Harm Self Yes  Description of Agreement verbal  Danger to Others  Danger to Others None reported or observed   Patient confused with unstable gait this shift. Required 1:1 intervention for ADLs and fall observation.

## 2023-08-01 NOTE — Plan of Care (Signed)
 Pt new to the unit tonight, hasn't had time to progress  Problem: Education: Goal: Knowledge of General Education information will improve Description: Including pain rating scale, medication(s)/side effects and non-pharmacologic comfort measures Outcome: Not Progressing   Problem: Health Behavior/Discharge Planning: Goal: Ability to manage health-related needs will improve Outcome: Not Progressing   Problem: Clinical Measurements: Goal: Ability to maintain clinical measurements within normal limits will improve Outcome: Not Progressing Goal: Will remain free from infection Outcome: Not Progressing Goal: Diagnostic test results will improve Outcome: Not Progressing Goal: Respiratory complications will improve Outcome: Not Progressing Goal: Cardiovascular complication will be avoided Outcome: Not Progressing   Problem: Activity: Goal: Risk for activity intolerance will decrease Outcome: Not Progressing   Problem: Nutrition: Goal: Adequate nutrition will be maintained Outcome: Not Progressing   Problem: Coping: Goal: Level of anxiety will decrease Outcome: Not Progressing   Problem: Elimination: Goal: Will not experience complications related to bowel motility Outcome: Not Progressing Goal: Will not experience complications related to urinary retention Outcome: Not Progressing   Problem: Pain Managment: Goal: General experience of comfort will improve Outcome: Not Progressing   Problem: Safety: Goal: Ability to remain free from injury will improve Outcome: Not Progressing   Problem: Skin Integrity: Goal: Risk for impaired skin integrity will decrease Outcome: Not Progressing   Problem: Education: Goal: Knowledge of [place] [facility] Education information/materials will improve Outcome: Not Progressing Goal: Emotional status will improve Outcome: Not Progressing Goal: Mental status will improve Outcome: Not Progressing Goal: Verbalization of understanding  the information provided will improve Outcome: Not Progressing   Problem: Activity: Goal: Interest or engagement in activities will improve Outcome: Not Progressing Goal: Sleeping patterns will improve Outcome: Not Progressing   Problem: Coping: Goal: Ability to verbalize frustrations and anger appropriately will improve Outcome: Not Progressing Goal: Ability to demonstrate self-control will improve Outcome: Not Progressing   Problem: Health Behavior/Discharge Planning: Goal: Identification of resources available to assist in meeting health care needs will improve Outcome: Not Progressing Goal: Compliance with treatment plan for underlying cause of condition will improve Outcome: Not Progressing   Problem: Physical Regulation: Goal: Ability to maintain clinical measurements within normal limits will improve Outcome: Not Progressing   Problem: Safety: Goal: Periods of time without injury will increase Outcome: Not Progressing

## 2023-08-01 NOTE — Progress Notes (Addendum)
Suicide Risk Assessment  Admission Assessment    [facility] Admission Suicide Risk Assessment   Nursing information obtained from:  Patient Demographic factors:  Male, Caucasian, Low socioeconomic status, Unemployed Current Mental Status:  Suicidal ideation indicated by patient Loss Factors:  Loss of significant relationship, Financial problems / change in socioeconomic status Historical Factors:  Prior suicide attempts Risk Reduction Factors:  Sense of responsibility to family  Total Time spent with patient: 1 hour Principal Problem: MDD (major depressive disorder), severe (HCC) Diagnosis:  Principal Problem:   MDD (major depressive disorder), severe (HCC)  Subjective Data: 32 y/o male w/ history of alcohol abuse, adhd, mdd, admitted to inpatient psychiatry as a transfer from [facility] with complaints of increased depression and SI in the context of multiple stressors.  Continued Clinical Symptoms:  Alcohol Use Disorder Identification Test Final Score (AUDIT): 9 The "Alcohol Use Disorders Identification Test", Guidelines for Use in Primary Care, Second Edition.  World Science writer [facility]). Score between 0-7:  no or low risk or alcohol related problems. Score between 8-15:  moderate risk of alcohol related problems. Score between 16-19:  high risk of alcohol related problems. Score 20 or above:  warrants further diagnostic evaluation for alcohol dependence and treatment.   CLINICAL FACTORS:   Depression:   Comorbid alcohol abuse/dependence Hopelessness Alcohol/Substance Abuse/Dependencies Previous Psychiatric Diagnoses and Treatments   Musculoskeletal: Strength & Muscle Tone: within normal limits Gait & Station: normal Patient leans: N/A  Psychiatric Specialty Exam:  Presentation  General Appearance:  Appropriate for Environment  Eye Contact: Fair  Speech: Clear and Coherent; Normal Rate  Speech Volume: Normal  Handedness: Right   Mood and Affect   Mood: Depressed; Anxious  Affect: Blunt   Thought Process  Thought Processes: Coherent; Goal Directed; Linear  Descriptions of Associations:Intact  Orientation:Full (Time, Place and Person)  Thought Content:Logical  History of Schizophrenia/Schizoaffective disorder:No  Duration of Psychotic Symptoms:Not applicable Hallucinations:Hallucinations: None  Ideas of Reference:None  Suicidal Thoughts:Suicidal Thoughts: No  Homicidal Thoughts:Homicidal Thoughts: No   Sensorium  Memory: Immediate Good; Recent Good; Remote Good  Judgment: Fair  Insight: Fair   Art therapist  Concentration: Good  Attention Span: Good  Recall: Good  Fund of Knowledge: Good  Language: Good   Psychomotor Activity  Psychomotor Activity: Psychomotor Activity: Normal   Assets  Assets: Communication Skills; Desire for Improvement; Financial Resources/Insurance; Social Support; Leisure Time; Housing; Resilience   Sleep  Sleep: Sleep: Poor    Physical Exam: Physical Exam see admission ROS see admission Blood pressure (!) 147/92, pulse 100, temperature 97.8 F (36.6 C), temperature source Oral, resp. rate 20, height 5\' 8"  (1.727 m), weight 93.4 kg, SpO2 98%. Body mass index is 31.32 kg/m.   COGNITIVE FEATURES THAT CONTRIBUTE TO RISK:  None    SUICIDE RISK:   Mild:  Suicidal ideation of limited frequency, intensity, duration, and specificity.  There are no identifiable plans, no associated intent, mild dysphoria and related symptoms, good self-control (both objective and subjective assessment), few other risk factors, and identifiable protective factors, including available and accessible social support.  PLAN OF CARE:  Daily contact with patient to assess and evaluate symptoms and progress in treatment Medication managemenet  I certify that inpatient services furnished can reasonably be expected to improve the patient's condition.   Lauree Chandler,  NP 08/01/2023, 1:28 PM

## 2023-08-01 NOTE — Tx Team (Signed)
Initial Treatment Plan 08/01/2023 12:10 AM Brett Sanford [id number]    PATIENT STRESSORS: Financial difficulties   Marital or family conflict   Medication change or noncompliance     PATIENT STRENGTHS: Ability for insight  Motivation for treatment/growth  Supportive family/friends    PATIENT IDENTIFIED PROBLEMS: Depression  Alcohol Abuse  SI                 DISCHARGE CRITERIA:  Motivation to continue treatment in a less acute level of care Verbal commitment to aftercare and medication compliance  PRELIMINARY DISCHARGE PLAN: Outpatient therapy Return to previous living arrangement  PATIENT/FAMILY INVOLVEMENT: This treatment plan has been presented to and reviewed with the patient, Brett Sanford.  The patient has been given the opportunity to ask questions and make suggestions.  Elmyra Ricks, RN 08/01/2023, 12:10 AM

## 2023-08-01 NOTE — Plan of Care (Signed)
  Problem: Education: Goal: Knowledge of General Education information will improve Description: Including pain rating scale, medication(s)/side effects and non-pharmacologic comfort measures Outcome: Progressing   Problem: Coping: Goal: Ability to demonstrate self-control will improve Outcome: Progressing

## 2023-08-01 NOTE — Group Note (Signed)
[facility] LCSW Group Therapy Note   Group Date: 08/01/2023 Start Time: 1335 End Time: 1405   Type of Therapy/Topic:  Group Therapy:  Emotion Regulation  Participation Level:  Active   Mood:  Description of Group:    The purpose of this group is to assist patients in learning to regulate negative emotions and experience positive emotions. Patients will be guided to discuss ways in which they have been vulnerable to their negative emotions. These vulnerabilities will be juxtaposed with experiences of positive emotions or situations, and patients challenged to use positive emotions to combat negative ones. Special emphasis will be placed on coping with negative emotions in conflict situations, and patients will process healthy conflict resolution skills.  Therapeutic Goals: Patient will identify two positive emotions or experiences to reflect on in order to balance out negative emotions:  Patient will label two or more emotions that they find the most difficult to experience:  Patient will be able to demonstrate positive conflict resolution skills through discussion or role plays:   Summary of Patient Progress:   Patient attended group and was able to discuss emotional regulation topic appropriately.    Therapeutic Modalities:   Cognitive Behavioral Therapy Feelings Identification Dialectical Behavioral Therapy   Whitney Post, LCSWA

## 2023-08-01 NOTE — Group Note (Signed)
Date:  08/01/2023 Time:  6:38 PM  Group Topic/Focus:  Healthy Communication:   The focus of this group is to discuss communication, barriers to communication, as well as healthy ways to communicate with others.    Participation Level:  Active  Participation Quality:  Appropriate  Affect:  Appropriate  Cognitive:  Appropriate  Insight: Appropriate  Engagement in Group:  Developing/Improving  Modes of Intervention:  Activity  Additional Comments:    Hye Trawick 08/01/2023, 6:38 PM

## 2023-08-01 NOTE — Group Note (Signed)
Date:  08/01/2023 Time:  9:25 PM  Group Topic/Focus:  Wrap-Up Group:   The focus of this group is to help patients review their daily goal of treatment and discuss progress on daily workbooks.    Participation Level:  Minimal  Participation Quality:  Appropriate and Attentive  Affect:  Appropriate  Cognitive:  Alert  Insight: Good  Engagement in Group:  None  Modes of Intervention:  Discussion  Additional Comments:     Maglione,Malesha Suliman E 08/01/2023, 9:25 PM

## 2023-08-01 NOTE — [facility] Suicide Risk Assessment (Signed)
[facility] INPATIENT:  Family/Significant Other Suicide Prevention Education  Suicide Prevention Education:  Patient Refusal for Family/Significant Other Suicide Prevention Education: The patient Brett Sanford has refused to provide written consent for family/significant other to be provided Family/Significant Other Suicide Prevention Education during admission and/or prior to discharge.  Physician notified. SPE completed with pt, as pt refused to consent to family contact. SPI pamphlet provided to pt and pt was encouraged to share information with support network, ask questions, and talk about any concerns relating to SPE. Pt denies access to guns/firearms and verbalized understanding of information provided. Mobile Crisis information also provided to pt.    Harden Mo 08/01/2023, 3:00 PM

## 2023-08-01 NOTE — Progress Notes (Signed)
Patient admitted to unit, alert and orient. Endorses passive SI, but verbally contracts for safety to come to [place] if he were to have self harm thoughts. Patient presents with sad, flat affect. Extremely tremulous but states he is ok at the moment. Patient current stressor are recent break up with girlfriend, loss job. financial and substance abuse. Positive for Meth, THC and alcohol.  Patient is living with mom currently and reports he is able to return there post discharge. Ciwa =10 on admission, per report Ativan given at 10:16. Patient has positive social support. Skin and contraband search completed and witnessed by [facility], Charity fundraiser. No skin issues noted no contraband found. Fluid and nutrition offered. Gatorade and water provided to patient. Oriented patient to room and unit. All questions answered. Patient receptive and remains safe on unit. Will continue to assess.

## 2023-08-01 NOTE — H&P (Signed)
Psychiatric Admission Assessment Adult  Patient Identification: Brett Sanford MRN:  [id number] Date of Evaluation:  08/01/2023 Chief Complaint:  MDD (major depressive disorder), severe (HCC) [F32.2] Principal Diagnosis: MDD (major depressive disorder), severe (HCC) Diagnosis:  Principal Problem:   MDD (major depressive disorder), severe (HCC)  History of Present Illness:  32 y/o male w/ history of alcohol abuse, adhd, mdd, admitted to inpatient psychiatry as a transfer from [facility] with complaints of increased depression and SI.  On assessment today, pt reports he was admitted because "I needed some help. Trying to get back on track." Pt endorses worsening depressive symptoms including depressed mood, anhedonia, insomnia, fatigue, feelings of worthlessness/guilt, difficulty concentrating, recurrent suicidal ideations, anxiety, loss of energy/fatigue, disturbed sleep, weight loss. He denies current suicidal ideations, although reports intermittent suicidal ideations with plan to overdose on medications for the past couple of weeks. He denies homicidal ideations. He denies auditory visual hallucinations or paranoia.   Pt reports alcohol use daily for the past month. Also endorses marijuana use "habitually", several times/week. Denies use of other substances, although UDS from 07/31/23 is positive for methamphetamines and marijuana. Alcohol from 07/31/23 is 147.  Pt reports history of 1 past suicide attempt about 4 to 5 years ago when he overdosed on OTC sleeping medicine. He reports history of NSSIB, cutting and burning himself, reports last occurring at least 4 years ago. He endorses history of inpatient psychiatric hospitalization.  He endorses past medication trials of prozac-experienced sexual issues. Reports he believes effexor was helpful. States he is not sure if he wants to start medications during admission.   Reports in the past month he has been experiencing stressors including loss of  employment at [employer], end of romantic relationship, ex-partner suicide attempt this past weekend.   Pt reports he lives with his mother. States he has been living with his mother for the past 5 years. Feels housing situation is stablet.   Associated Signs/Symptoms: Depression Symptoms:  depressed mood, anhedonia, insomnia, fatigue, feelings of worthlessness/guilt, difficulty concentrating, hopelessness, recurrent thoughts of death, anxiety, loss of energy/fatigue, disturbed sleep, weight loss, (Hypo) Manic Symptoms:   denies Anxiety Symptoms:  Excessive Worry, Psychotic Symptoms:   denies PTSD Symptoms: Negative Total Time spent with patient: 1 hour  Past Psychiatric History: alcohol abuse, adhd, mdd,  Is the patient at risk to self? Denies current suicidal ideations although endorses has been experiencing intermittent suicidal ideations with a plan to overdose on medications  Has the patient been a risk to self in the past 6 months? No.  Has the patient been a risk to self within the distant past? Yes.    Is the patient a risk to others? No.  Has the patient been a risk to others in the past 6 months? No.  Has the patient been a risk to others within the distant past? No.   [place] Scale:  Flowsheet Row Admission (Current) from 08/01/2023 in [facility] INPATIENT [facility]AL MEDICINE ED from 07/31/2023 in [facility] ED from 09/10/2022 in [facility] Emergency Department at [facility]  C-SSRS RISK CATEGORY High Risk Moderate Risk No Risk        Prior Inpatient Therapy: Yes.   Reports at least 3 or 4 inpatient psychiatric hospitalizations, last 4 years ago, after attempted suicide. Prior Outpatient Therapy: Yes.   Last had outpatient therapy about 5 or 6 years ago at [facility].  Alcohol Screening: 1. How often do you have a drink containing alcohol?:  2 to 3 times a week 2. How many drinks containing alcohol do you have on a  typical day when you are drinking?: 7, 8, or 9 3. How often do you have six or more drinks on one occasion?: Weekly AUDIT-C Score: 9 4. How often during the last year have you found that you were not able to stop drinking once you had started?: Never 5. How often during the last year have you failed to do what was normally expected from you because of drinking?: Never 6. How often during the last year have you needed a first drink in the morning to get yourself going after a heavy drinking session?: Never 7. How often during the last year have you had a feeling of guilt of remorse after drinking?: Never 8. How often during the last year have you been unable to remember what happened the night before because you had been drinking?: Never 9. Have you or someone else been injured as a result of your drinking?: No 10. Has a relative or friend or a doctor or another health worker been concerned about your drinking or suggested you cut down?: No Alcohol Use Disorder Identification Test Final Score (AUDIT): 9 Alcohol Brief Interventions/Follow-up: Alcohol education/Brief advice Substance Abuse History in the last 12 months:  Yes.   Consequences of Substance Abuse: Medical Consequences:  detox Previous Psychotropic Medications: Yes  Psychological Evaluations:  Unknown Past Medical History:  Past Medical History:  Diagnosis Date   ADD (attention deficit disorder)    Drug abuse (HCC)    Medical history non-contributory     Past Surgical History:  Procedure Laterality Date   NO PAST SURGERIES     Family History:  Family History  Family history unknown: Yes   Family Psychiatric  History: Reports maternal aunt diagnosed with borderline personality disorder Tobacco Screening:  Social History   Tobacco Use  Smoking Status Every Day   Current packs/day: 1.50   Average packs/day: 1.5 packs/day for 5.0 years (7.5 ttl pk-yrs)   Types: Cigarettes  Smokeless Tobacco Never  Tobacco Comments    1-2 packs per day/5 years    BH Tobacco Counseling     Are you interested in Tobacco Cessation Medications?  No, patient refused Counseled patient on smoking cessation:  Yes Reason Tobacco Screening Not Completed: No value filed.       Social History:  Social History   Substance and Sexual Activity  Alcohol Use Yes   Alcohol/week: 8.0 standard drinks of alcohol   Types: 8 Shots of liquor per week   Comment: a few shots/5-6 days/week     Social History   Substance and Sexual Activity  Drug Use Yes   Types: Marijuana, Methamphetamines   Comment: once a week    Additional Social History: Marital status: Single Are you sexually active?: No What is your sexual orientation?: Straight/heterosexual Has your sexual activity been affected by drugs, alcohol, medication, or emotional stress?: None reported Does patient have children?: No                         Allergies:   Allergies  Allergen Reactions   Benadryl [Diphenhydramine Hcl] Other (See Comments)    Caused hyperactivity   Metoclopramide Other (See Comments)    Jittery   Lab Results:  Results for orders placed or performed during the hospital encounter of 07/31/23 (from the past 48 hour(s))  CBC with Differential/Platelet     Status: None  Collection Time: 07/31/23 12:32 PM  Result Value Ref Range   WBC 7.3 4.0 - 10.5 K/uL   RBC 5.26 4.22 - 5.81 MIL/uL   Hemoglobin 17.0 13.0 - 17.0 g/dL   HCT 16.1 09.6 - 04.5 %   MCV 94.1 80.0 - 100.0 fL   MCH 32.3 26.0 - 34.0 pg   MCHC 34.3 30.0 - 36.0 g/dL   RDW 40.9 81.1 - 91.4 %   Platelets 229 150 - 400 K/uL   nRBC 0.0 0.0 - 0.2 %   Neutrophils Relative % 68 %   Neutro Abs 5.0 1.7 - 7.7 K/uL   Lymphocytes Relative 19 %   Lymphs Abs 1.4 0.7 - 4.0 K/uL   Monocytes Relative 10 %   Monocytes Absolute 0.7 0.1 - 1.0 K/uL   Eosinophils Relative 1 %   Eosinophils Absolute 0.1 0.0 - 0.5 K/uL   Basophils Relative 1 %   Basophils Absolute 0.1 0.0 - 0.1 K/uL    Immature Granulocytes 1 %   Abs Immature Granulocytes 0.05 0.00 - 0.07 K/uL    Comment: Performed at [facility], [address] [place], [place] [address]  Comprehensive metabolic panel     Status: Abnormal   Collection Time: 07/31/23 12:32 PM  Result Value Ref Range   Sodium 139 135 - 145 mmol/L   Potassium 3.8 3.5 - 5.1 mmol/L   Chloride 102 98 - 111 mmol/L   CO2 18 (L) 22 - 32 mmol/L   Glucose, Bld 115 (H) 70 - 99 mg/dL    Comment: Glucose reference range applies only to samples taken after fasting for at least 8 hours.   BUN 10 6 - 20 mg/dL   Creatinine, Ser 6.21 0.61 - 1.24 mg/dL   Calcium 9.2 8.9 - 30.8 mg/dL   Total Protein 7.4 6.5 - 8.1 g/dL   Albumin 4.3 3.5 - 5.0 g/dL   AST 657 (H) 15 - 41 U/L   ALT 176 (H) 0 - 44 U/L   Alkaline Phosphatase 92 38 - 126 U/L   Total Bilirubin 3.0 (H) 0.3 - 1.2 mg/dL   GFR, Estimated >84 >69 mL/min    Comment: (NOTE) Calculated using the CKD-EPI Creatinine Equation (2021)    Anion gap 19 (H) 5 - 15    Comment: Performed at [facility], [address], [place], [place] [address]  Hemoglobin A1c     Status: None   Collection Time: 07/31/23 12:32 PM  Result Value Ref Range   Hgb A1c MFr Bld 4.9 4.8 - 5.6 %    Comment: (NOTE) Pre diabetes:          5.7%-6.4%  Diabetes:              >6.4%  Glycemic control for   <7.0% adults with diabetes    Mean Plasma Glucose 93.93 mg/dL    Comment: Performed at [facility], [address], [place], [place] [address]  Magnesium     Status: None   Collection Time: 07/31/23 12:32 PM  Result Value Ref Range   Magnesium 1.8 1.7 - 2.4 mg/dL    Comment: Performed at [facility], [address] [place], [place] [address]  Ethanol     Status: Abnormal   Collection Time: 07/31/23 12:32 PM  Result Value Ref Range   Alcohol, Ethyl (B) 147 (H) <10 mg/dL    Comment: (NOTE) Lowest detectable limit for serum alcohol is 10 mg/dL.  For medical  purposes only. Performed at [facility], [address], [place], [place] [address]   Lipid panel     Status: Abnormal   Collection Time: 07/31/23 12:32 PM  Result Value Ref Range   Cholesterol 317 (H) 0 - 200 mg/dL   Triglycerides 782 (H) <150 mg/dL   HDL 956 >21 mg/dL   Total CHOL/HDL Ratio 2.9 RATIO   VLDL 48 (H) 0 - 40 mg/dL   LDL Cholesterol 308 (H) 0 - 99 mg/dL    Comment:        Total Cholesterol/HDL:CHD Risk Coronary Heart Disease Risk Table                     Men   Women  1/2 Average Risk   3.4   3.3  Average Risk       5.0   4.4  2 X Average Risk   9.6   7.1  3 X Average Risk  23.4   11.0        Use the calculated Patient Ratio above and the CHD Risk Table to determine the patient's CHD Risk.        ATP III CLASSIFICATION (LDL):  <100     mg/dL   Optimal  657-846  mg/dL   Near or Above                    Optimal  130-159  mg/dL   Borderline  962-952  mg/dL   High  >841     mg/dL   Very High Performed at [facility], [address], [place], [place] [address]   TSH     Status: None   Collection Time: 07/31/23 12:32 PM  Result Value Ref Range   TSH 2.721 0.350 - 4.500 uIU/mL    Comment: Performed by a 3rd Generation assay with a functional sensitivity of <=0.01 uIU/mL. Performed at [facility], [address] [place], [place] [address]   POCT Urine Drug Screen - (I-Screen)     Status: Abnormal   Collection Time: 07/31/23 12:32 PM  Result Value Ref Range   POC Amphetamine UR None Detected NONE DETECTED (Cut Off Level 1000 ng/mL)   POC Secobarbital (BAR) None Detected NONE DETECTED (Cut Off Level 300 ng/mL)   POC Buprenorphine (BUP) None Detected NONE DETECTED (Cut Off Level 10 ng/mL)   POC Oxazepam (BZO) None Detected NONE DETECTED (Cut Off Level 300 ng/mL)   POC Cocaine UR None Detected NONE DETECTED (Cut Off Level 300 ng/mL)   POC Methamphetamine UR Positive (A) NONE DETECTED (Cut Off Level 1000 ng/mL)   POC Morphine None Detected NONE DETECTED (Cut Off Level 300  ng/mL)   POC Methadone UR None Detected NONE DETECTED (Cut Off Level 300 ng/mL)   POC Oxycodone UR None Detected NONE DETECTED (Cut Off Level 100 ng/mL)   POC Marijuana UR Positive (A) NONE DETECTED (Cut Off Level 50 ng/mL)    Blood Alcohol level:  Lab Results  Component Value Date   ETH 147 (H) 07/31/2023   ETH <10 04/15/2020    Metabolic Disorder Labs:  Lab Results  Component Value Date   HGBA1C 4.9 07/31/2023   MPG 93.93 07/31/2023   MPG 96.8 04/15/2020   No results found for: "PROLACTIN" Lab Results  Component Value Date   CHOL 317 (H) 07/31/2023   TRIG 241 (H) 07/31/2023   HDL 110 07/31/2023   CHOLHDL 2.9 07/31/2023   VLDL  48 (H) 07/31/2023   LDLCALC 159 (H) 07/31/2023   LDLCALC NOT CALCULATED 04/15/2020    Current Medications: Current Facility-Administered Medications  Medication Dose Route Frequency Provider Last Rate Last Admin   acetaminophen (TYLENOL) tablet 650 mg  650 mg Oral Q6H PRN Ardis Hughs, NP       alum & mag hydroxide-simeth (MAALOX/MYLANTA) 200-200-20 MG/5ML suspension 30 mL  30 mL Oral Q4H PRN Ardis Hughs, NP       haloperidol (HALDOL) tablet 5 mg  5 mg Oral TID PRN Ardis Hughs, NP       Or   haloperidol lactate (HALDOL) injection 5 mg  5 mg Intramuscular TID PRN Ardis Hughs, NP       hydrOXYzine (ATARAX) tablet 25 mg  25 mg Oral Q6H PRN Ardis Hughs, NP       loperamide (IMODIUM) capsule 2-4 mg  2-4 mg Oral PRN Ardis Hughs, NP       LORazepam (ATIVAN) tablet 2 mg  2 mg Oral TID PRN Ardis Hughs, NP       Or   LORazepam (ATIVAN) injection 2 mg  2 mg Intramuscular TID PRN Ardis Hughs, NP       LORazepam (ATIVAN) tablet 1 mg  1 mg Oral Q6H PRN Ardis Hughs, NP       LORazepam (ATIVAN) tablet 1 mg  1 mg Oral TID Ardis Hughs, NP   1 mg at 08/01/23 1200   Followed by   Melene Muller ON 08/02/2023] LORazepam (ATIVAN) tablet 1 mg  1 mg Oral BID Ardis Hughs, NP       Followed by   Melene Muller  ON 08/03/2023] LORazepam (ATIVAN) tablet 1 mg  1 mg Oral Daily Ardis Hughs, NP       magnesium hydroxide (MILK OF MAGNESIA) suspension 30 mL  30 mL Oral Daily PRN Ardis Hughs, NP       multivitamin with minerals tablet 1 tablet  1 tablet Oral Daily Ardis Hughs, NP   1 tablet at 08/01/23 0856   ondansetron (ZOFRAN-ODT) disintegrating tablet 4 mg  4 mg Oral Q6H PRN Ardis Hughs, NP       pantoprazole (PROTONIX) EC tablet 40 mg  40 mg Oral Daily Vernard Gambles H, NP   40 mg at 08/01/23 6073   thiamine (VITAMIN B1) tablet 100 mg  100 mg Oral Daily Ardis Hughs, NP   100 mg at 08/01/23 0856   traZODone (DESYREL) tablet 50 mg  50 mg Oral QHS PRN Ardis Hughs, NP       PTA Medications: Medications Prior to Admission  Medication Sig Dispense Refill Last Dose   omeprazole (PRILOSEC OTC) 20 MG tablet Take 20 mg by mouth daily.      ondansetron (ZOFRAN-ODT) 4 MG disintegrating tablet Take 4 mg by mouth every 8 (eight) hours as needed. (Patient not taking: Reported on 07/31/2023)      traZODone (DESYREL) 50 MG tablet Take 2 tablets (100 mg total) by mouth at bedtime as needed for sleep. 60 tablet 3     Musculoskeletal: Strength & Muscle Tone: within normal limits Gait & Station: normal Patient leans: N/A            Psychiatric Specialty Exam:  Presentation  General Appearance:  Appropriate for Environment  Eye Contact: Fair  Speech: Clear and Coherent; Normal Rate  Speech Volume: Normal  Handedness: Right   Mood and Affect  Mood: Depressed; Anxious  Affect: Blunt   Thought Process  Thought Processes: Coherent; Goal Directed; Linear  Duration of Psychotic Symptoms:N/A Past Diagnosis of Schizophrenia or Psychoactive disorder: No  Descriptions of Associations:Intact  Orientation:Full (Time, Place and Person)  Thought Content:Logical  Hallucinations:Hallucinations: None  Ideas of Reference:None  Suicidal  Thoughts:Suicidal Thoughts: No SI Active Intent and/or Plan: With Intent; With Plan; With Means to Carry Out  Homicidal Thoughts:Homicidal Thoughts: No   Sensorium  Memory: Immediate Good; Recent Good; Remote Good  Judgment: Fair  Insight: Fair   Art therapist  Concentration: Good  Attention Span: Good  Recall: Good  Fund of Knowledge: Good  Language: Good   Psychomotor Activity  Psychomotor Activity: Psychomotor Activity: Normal   Assets  Assets: Communication Skills; Desire for Improvement; Financial Resources/Insurance; Social Support; Leisure Time; Housing; Resilience   Sleep  Sleep: Sleep: Poor    Physical Exam: Physical Exam Constitutional:      General: He is not in acute distress.    Appearance: He is diaphoretic. He is not ill-appearing or toxic-appearing.  Eyes:     General: No scleral icterus. Cardiovascular:     Rate and Rhythm: Normal rate.  Pulmonary:     Effort: Pulmonary effort is normal. No respiratory distress.  Neurological:     Mental Status: He is alert and oriented to person, place, and time.  Psychiatric:        Attention and Perception: Attention and perception normal.        Mood and Affect: Mood is anxious and depressed. Affect is blunt.        Speech: Speech normal.        Behavior: Behavior normal. Behavior is cooperative.        Thought Content: Thought content normal.        Cognition and Memory: Cognition and memory normal.        Judgment: Judgment normal.    Review of Systems  Constitutional:  Positive for chills, diaphoresis, malaise/fatigue and weight loss. Negative for fever.  Respiratory:  Negative for shortness of breath.   Cardiovascular:  Negative for chest pain and palpitations.  Gastrointestinal:  Positive for diarrhea and vomiting.  Neurological:  Positive for dizziness.  Psychiatric/Behavioral:  Positive for depression and substance abuse. The patient is nervous/anxious and has insomnia.     Blood pressure (!) 147/92, pulse 100, temperature 97.8 F (36.6 C), temperature source Oral, resp. rate 20, height 5\' 8"  (1.727 m), weight 93.4 kg, SpO2 98%. Body mass index is 31.32 kg/m.  Treatment Plan Summary: Daily contact with patient to assess and evaluate symptoms and progress in treatment, Medication management, and Plan    -Continue tylenol 650mg  oral every 6 hours PRN mild pain -Continue maalox/mylanta 30mL oral every 4 hours PRN indigestion -Continue agitation protocol: haldol 5mg  oral or IM 3 times daily PRN agitation; ativan 2mg  oral or IM 3 times daily PRN agitation -Continue atarax 25mg  oral every 6 hours PRN anxiety/agitation or CIWA < or = 10 -Continue loperamide 2-4mg  oral as needed diarrhea or loose stools -Continue ativan 1mg  oral every 6 hours PRN CIWA >10 -Continue ativan taper  -Continue milk of magnesia 30mL oral daily PRN mild constipation -Continue multivitamin with minerals 1 tablet oral daily for supplement -Continue Zofran-ODT 4mg  oral every 6 hours PRN nausea, vomiting -Continue protonix 40mg  oral daily for GERD -Continue thiamine 100mg  oral daily for supplement -Continue trazodone 50mg  oral at bedtime PRN sleep -Continue CIWA for alcohol detox  Observation Level/Precautions:  15 minute  checks  Laboratory:   no new labs currently  Psychotherapy:    Medications:    Consultations:    Discharge Concerns:    Estimated LOS:  Other:     Physician Treatment Plan for Primary Diagnosis: MDD (major depressive disorder), severe (HCC) Long Term Goal(s): Improvement in symptoms so as ready for discharge  Short Term Goals: Ability to identify changes in lifestyle to reduce recurrence of condition will improve, Ability to verbalize feelings will improve, Ability to demonstrate self-control will improve, Ability to identify and develop effective coping behaviors will improve, and Ability to identify triggers associated with substance abuse/mental health issues  will improve  I certify that inpatient services furnished can reasonably be expected to improve the patient's condition.    Lauree Chandler, NP 10/22/20241:22 PM

## 2023-08-01 NOTE — Group Note (Signed)
Recreation Therapy Group Note   Group Topic:Coping Skills  Group Date: 08/01/2023 Start Time: 1000 End Time: 1100 Facilitators: Rosina Lowenstein, LRT, CTRS Location:  Craft Room  Group Description: Mind Map.  Patient was provided a blank template of a diagram with 32 blank boxes in a tiered system, branching from the center (similar to a bubble chart). LRT directed patients to label the middle of the diagram "Coping Skills". LRT and patients then came up with 8 different coping skills as examples. Pt were directed to record their coping skills in the 2nd tier boxes closest to the center.  Patients would then share their coping skills with the group as LRT wrote them out. LRT gave a handout of 99 different coping skills at the end of group.   Goal Area(s) Addressed: Patients will be able to define "coping skills". Patient will identify new coping skills.  Patient will increase communication.   Affect/Mood: Appropriate   Participation Level: Active and Engaged   Participation Quality: Independent   Behavior: Appropriate, Calm, and Cooperative   Speech/Thought Process: Coherent   Insight: Good   Judgement: Good   Modes of Intervention: Activity   Patient Response to Interventions:  Attentive, Engaged, Interested , and Receptive   Education Outcome:  Acknowledges education   Clinical Observations/Individualized Feedback: Brett Sanford was active in their participation of session activities and group discussion. Pt identified "hiking, pets, cooking" as coping skills. Pt interacted well with LRT and peers duration of session.   Plan: Continue to engage patient in RT group sessions 2-3x/week.   Rosina Lowenstein, LRT, CTRS 08/01/2023 11:45 AM

## 2023-08-01 NOTE — OR Nursing (Signed)
Patient given atrax for anxiety. Patient stated that anxiety is a 8/10 and is due to racing thoughts.

## 2023-08-01 NOTE — [facility] Counselor (Signed)
Adult Comprehensive Assessment  Patient ID: Brett Sanford, male   DOB: 1991/03/27, 32 y.o.   MRN: [id number]  Information Source: Information source: Patient  Current Stressors:  Patient states their primary concerns and needs for treatment are:: "I was having bad thoughts/suicidal thoughts." Patient states their goals for this hospitilization and ongoing recovery are:: To detox and to get back with mental health treatment" Educational / Learning stressors: None reported Employment / Job issues: None reported Family Relationships: Pt reports deficits in communication with his mother at times Financial / Lack of resources (include bankruptcy): Pt is unemployed and has no Pharmacist, hospital / Lack of housing: Pt resides with his mother currently Physical health (include injuries & life threatening diseases): Pt reports problems due to gastritis Social relationships: None reported Substance abuse: Pt reports significant alcohol use with increase in recent months. Pt also endorses alcohol withdrawal symptoms Bereavement / Loss: None reported  Living/Environment/Situation:  Living Arrangements: Parent (pt currently resides wit his mother) Living conditions (as described by patient or guardian): Pt describes his home as strict but supportive environment Who else lives in the home?: Pt's mother How long has patient lived in current situation?: 5 years What is atmosphere in current home: Supportive  Family History:  Marital status: Single Are you sexually active?: No What is your sexual orientation?: Straight/heterosexual Has your sexual activity been affected by drugs, alcohol, medication, or emotional stress?: None reported Does patient have children?: No  Childhood History:  By whom was/is the patient raised?: Both parents, Mother (pt primarily raised by mother. his father passed away at an early age) Description of patient's relationship with caregiver when they were a child:  Pt reports his mother was very strict and his father was a loving parent. Pt reports his mother's strictness was due to being overprotective Patient's description of current relationship with people who raised him/her: pt reports his mom is still supportive of him but strict with certain rules. How were you disciplined when you got in trouble as a child/adolescent?: Pt reports timeout, grounded, and occasional spanking Does patient have siblings?: Yes Number of Siblings: 2 Description of patient's current relationship with siblings: Pt reports a close relationship with his sister whom he speaks to often Did patient suffer any verbal/emotional/physical/sexual abuse as a child?: No Did patient suffer from severe childhood neglect?: No Has patient ever been sexually abused/assaulted/raped as an adolescent or adult?: No Was the patient ever a victim of a crime or a disaster?: No Witnessed domestic violence?: No Has patient been affected by domestic violence as an adult?: No  Education:  Highest grade of school patient has completed: 12th Currently a student?: No Learning disability?: Yes What learning problems does patient have?: Pt reports ADHD as his learning disability  Employment/Work Situation:   Employment Situation: Unemployed Work Stressors: Pt is currently unemployed and has had difficulties on jobs previously due to alcohol abuse Patient's Job has Been Impacted by Current Illness: Yes Describe how Patient's Job has Been Impacted: alcohol abuse What is the Longest Time Patient has Held a Job?: 5 years Where was the Patient Employed at that Time?: [employer] Has Patient ever Been in the [employer]?: No  Financial Resources:   Financial resources: No income Does patient have a Lawyer or guardian?: No  Alcohol/Substance Abuse:   What has been your use of drugs/alcohol within the last 12 months?: Alcohol If attempted suicide, did drugs/alcohol play a role in this?:  Yes Alcohol/Substance Abuse Treatment Hx:  (pt did  not disclose treatment type) If yes, describe treatment: Pt reports he felt previous alcohol use treatment was helpful and reports a return to use after treatment, length of time unknown Has alcohol/substance abuse ever caused legal problems?: No  Social Support System:   Patient's Community Support System: Fair Describe Community Support System: Pt reports a good relationship with his sister. He reports his mother and sister are both very supportive Type of faith/religion: Christianity How does patient's faith help to cope with current illness?: Pt reports he goes out in nature to talk to God  Leisure/Recreation:   Do You Have Hobbies?: Yes Leisure and Hobbies: Being outdoors: hiking, fishing, hunting  Strengths/Needs:   What is the patient's perception of their strengths?: Good cook, smart, funny, "not an asshole" Patient states they can use these personal strengths during their treatment to contribute to their recovery: Pt reports he will focus on these positive strengths to reduce negative thoughts Patient states these barriers may affect/interfere with their treatment: None reported Patient states these barriers may affect their return to the community: None reported  Discharge Plan:   Currently receiving community mental health services: No Patient states concerns and preferences for aftercare planning are: pt requests to be connected with mental health services post discharge Patient states they will know when they are safe and ready for discharge when: "When I am not thinking about suicide" Does patient have access to transportation?: Yes (pt reports his mother will provide transportation) Does patient have financial barriers related to discharge medications?: Yes Patient description of barriers related to discharge medications: Pt reports no financial means and no health insurance Will patient be returning to same living  situation after discharge?: Yes  Summary/Recommendations:   Summary and Recommendations (to be completed by the evaluator): Patient is a 31 year old male [place] [facility]   He reports that he is currently unemployed.  32 year old male presenting to [facility] voluntarily unaccompanied. Pt reports that he lost his job, and recently broke up with his partner. He has a primary diagnosis of Major Depressive Disorder, Severe.  Recommendations include: crisis stabilization, therapeutic milieu, encourage group attendance and participation, medication management for detox and mood stabilization and development of comprehensive mental wellness,and sobriety plan.  Felecia Shelling Derricka Mertz. 08/01/2023

## 2023-08-02 DIAGNOSIS — F332 Major depressive disorder, recurrent severe without psychotic features: Secondary | ICD-10-CM | POA: Diagnosis not present

## 2023-08-02 NOTE — Group Note (Signed)
Recreation Therapy Group Note   Group Topic:Healthy Support Systems  Group Date: 08/02/2023 Start Time: 1000 End Time: 1100 Facilitators: Rosina Lowenstein, LRT, CTRS Location:  Craft Room  Group Description: Straw Bridge. In groups or individually, patients were given 10 plastic drinking straws and an equal length of masking tape. Using the materials provided, patients were instructed to build a free-standing bridge-like structure to suspend an everyday item (ex: deck of cards) off the floor or table surface. All materials were required to be used in Secondary school teacher. LRT facilitated post-activity discussion reviewing the importance of having strong and healthy support systems in our lives. LRT discussed how the people in our lives serve as the tape and the deck of cards we placed on top of our straw structure are the stressors we face in daily life. LRT and pts discussed what happens in our life when things get too heavy for [place], and we don't have strong supports outside of the hospital. Pt shared 2 of their healthy supports in their life aloud in the group.   Goal Area(s) Addressed:  Patient will identify 2 healthy supports in their life. Patient will identify skills to successfully complete activity. Patient will identify correlation of this activity to life post-discharge.  Patient will work on Product manager. Patient will increase team-building and communication skills.    Affect/Mood: Appropriate   Participation Level: Active and Engaged   Participation Quality: Independent   Behavior: Calm and Cooperative   Speech/Thought Process: Coherent   Insight: Good   Judgement: Good   Modes of Intervention: Activity   Patient Response to Interventions:  Attentive, Engaged, Interested , and Receptive   Education Outcome:  Acknowledges education   Clinical Observations/Individualized Feedback: Jourdan was active in their participation of session activities and group  discussion. Pt was pulled by NP towards the end of group. Pt was not present for post activity processing.    Plan: Continue to engage patient in RT group sessions 2-3x/week.   Rosina Lowenstein, LRT, CTRS 08/02/2023 11:26 AM

## 2023-08-02 NOTE — Plan of Care (Signed)
  Problem: Coping: Goal: Level of anxiety will decrease Outcome: Progressing   Problem: Safety: Goal: Ability to remain free from injury will improve Outcome: Progressing   Problem: Education: Goal: Emotional status will improve Outcome: Progressing Goal: Mental status will improve Outcome: Progressing   

## 2023-08-02 NOTE — Progress Notes (Signed)
Nursing Shift Note:  1900-0700  Attended Evening Group: yes Medication Compliant:  yes Behavior: isolative; cooperative Sleep Quality: fair - requested medication for anxiety Significant Changes: none   08/02/23 0000  Psych Admission Type (Psych Patients Only)  Admission Status Voluntary  Psychosocial Assessment  Patient Complaints Worrying  Eye Contact Fair  Facial Expression Worried;Anxious  Affect Anxious  Speech Soft  Interaction Assertive  Motor Activity Slow  Appearance/Hygiene Poor hygiene  Behavior Characteristics Cooperative  Mood Anxious  Thought Process  Coherency WDL  Content WDL  Delusions None reported or observed  Perception WDL  Hallucination None reported or observed  Judgment Impaired  Confusion None  Danger to Self  Current suicidal ideation? Denies  Danger to Others  Danger to Others None reported or observed

## 2023-08-02 NOTE — Progress Notes (Signed)
D- Patient alert and oriented x 4. Affect anxious/mood congruent. Denies SI/ HI/ AVH. Patient denies pain. Patient endorses depression and anxiety. States his goal is "keeping her head up" and to "not isolate". A- Scheduled medications administered to patient, per MD orders. Support and encouragement provided. Routine safety checks conducted every 15 minutes without incident.  Patient informed to notify staff with problems or concerns and verbalizes understanding. R- No adverse drug reactions noted. Patient compliant with medications and treatment plan. Patient receptive and cooperative. He  interacts well with others on the unit.  Patient contracts for safety and  remains safe on the unit at this time.

## 2023-08-02 NOTE — Group Note (Signed)
Date:  08/02/2023 Time:  10:43 PM  Group Topic/Focus:  Bingo game and activity    Participation Level:  Active  Participation Quality:  Appropriate and Attentive  Affect:  Appropriate  Cognitive:  Alert and Appropriate  Insight: Appropriate, Good, and Improving  Engagement in Group:  Developing/Improving and Engaged  Modes of Intervention:  Activity, Discussion, Exploration, Problem-solving, Rapport Building, Socialization, and Support  Additional Comments:     Brett Sanford 08/02/2023, 10:43 PM

## 2023-08-02 NOTE — Group Note (Signed)
[facility] LCSW Group Therapy Note   Group Date: 08/02/2023 Start Time: 1309 End Time: 1410   Type of Therapy/Topic:  Group Therapy:  Balance in Life  Participation Level:  Minimal   Description of Group:    This group will address the concept of balance and how it feels and looks when one is unbalanced. Patients will be encouraged to process areas in their lives that are out of balance, and identify reasons for remaining unbalanced. Facilitators will guide patients utilizing problem- solving interventions to address and correct the stressor making their life unbalanced. Understanding and applying boundaries will be explored and addressed for obtaining  and maintaining a balanced life. Patients will be encouraged to explore ways to assertively make their unbalanced needs known to significant others in their lives, using other group members and facilitator for support and feedback.  Therapeutic Goals: Patient will identify two or more emotions or situations they have that consume much of in their lives. Patient will identify signs/triggers that life has become out of balance:  Patient will identify two ways to set boundaries in order to achieve balance in their lives:  Patient will demonstrate ability to communicate their needs through discussion and/or role plays  Summary of Patient Progress: Patient was present for the entirety of the group process. A past relationship was identified as something that caused him to become off balanced. He appeared open and receptive to feedback/comments from both his peers and facilitator. Pt appeared to have some insight into himself and his past relationship.   Therapeutic Modalities:   Cognitive Behavioral Therapy Solution-Focused Therapy Assertiveness Training   Glenis Smoker, LCSW

## 2023-08-02 NOTE — Progress Notes (Signed)
Patient voiced improvement in mood. Endorses anxiety but improving. Denies SI, HI, AVH. Medication compliant. Complaints of headache, insomnia and anxiety. Prn given with good relief. Encouragement and support provided. Safety checks maintained. Medications given as prescribed. Pt receptive and remains safe on unit with q 15 min checks.

## 2023-08-02 NOTE — Plan of Care (Signed)
  Problem: Education: Goal: Knowledge of General Education information will improve Description: Including pain rating scale, medication(s)/side effects and non-pharmacologic comfort measures Outcome: Progressing   Problem: Health Behavior/Discharge Planning: Goal: Ability to manage health-related needs will improve Outcome: Progressing   Problem: Clinical Measurements: Goal: Ability to maintain clinical measurements within normal limits will improve Outcome: Progressing Goal: Will remain free from infection Outcome: Progressing Goal: Diagnostic test results will improve Outcome: Progressing Goal: Respiratory complications will improve Outcome: Progressing Goal: Cardiovascular complication will be avoided Outcome: Progressing   Problem: Activity: Goal: Risk for activity intolerance will decrease Outcome: Progressing   Problem: Nutrition: Goal: Adequate nutrition will be maintained Outcome: Progressing   Problem: Coping: Goal: Level of anxiety will decrease Outcome: Progressing   Problem: Elimination: Goal: Will not experience complications related to bowel motility Outcome: Progressing Goal: Will not experience complications related to urinary retention Outcome: Progressing   Problem: Pain Managment: Goal: General experience of comfort will improve Outcome: Progressing   Problem: Safety: Goal: Ability to remain free from injury will improve Outcome: Progressing   Problem: Skin Integrity: Goal: Risk for impaired skin integrity will decrease Outcome: Progressing   Problem: Education: Goal: Knowledge of [facility] Education information/materials will improve Outcome: Progressing Goal: Emotional status will improve Outcome: Progressing Goal: Mental status will improve Outcome: Progressing Goal: Verbalization of understanding the information provided will improve Outcome: Progressing   Problem: Activity: Goal: Interest or engagement in activities will  improve Outcome: Progressing Goal: Sleeping patterns will improve Outcome: Progressing   Problem: Coping: Goal: Ability to verbalize frustrations and anger appropriately will improve Outcome: Progressing Goal: Ability to demonstrate self-control will improve Outcome: Progressing   Problem: Health Behavior/Discharge Planning: Goal: Identification of resources available to assist in meeting health care needs will improve Outcome: Progressing Goal: Compliance with treatment plan for underlying cause of condition will improve Outcome: Progressing   Problem: Physical Regulation: Goal: Ability to maintain clinical measurements within normal limits will improve Outcome: Progressing   Problem: Safety: Goal: Periods of time without injury will increase Outcome: Progressing   

## 2023-08-02 NOTE — BH IP Treatment Plan (Addendum)
Interdisciplinary Treatment and Diagnostic Plan Update  08/02/2023 Time of Session: 9:28 AM Brett Sanford MRN: [id number]  Principal Diagnosis: MDD (major depressive disorder), severe (HCC)  Secondary Diagnoses: Principal Problem:   MDD (major depressive disorder), severe (HCC)   Current Medications:  Current Facility-Administered Medications  Medication Dose Route Frequency Provider Last Rate Last Admin   acetaminophen (TYLENOL) tablet 650 mg  650 mg Oral Q6H PRN Ardis Hughs, NP   650 mg at 08/02/23 0850   alum & mag hydroxide-simeth (MAALOX/MYLANTA) 200-200-20 MG/5ML suspension 30 mL  30 mL Oral Q4H PRN Ardis Hughs, NP       haloperidol (HALDOL) tablet 5 mg  5 mg Oral TID PRN Ardis Hughs, NP       Or   haloperidol lactate (HALDOL) injection 5 mg  5 mg Intramuscular TID PRN Ardis Hughs, NP       hydrOXYzine (ATARAX) tablet 25 mg  25 mg Oral Q6H PRN Ardis Hughs, NP   25 mg at 08/02/23 0255   loperamide (IMODIUM) capsule 2-4 mg  2-4 mg Oral PRN Ardis Hughs, NP       LORazepam (ATIVAN) tablet 2 mg  2 mg Oral TID PRN Ardis Hughs, NP       Or   LORazepam (ATIVAN) injection 2 mg  2 mg Intramuscular TID PRN Ardis Hughs, NP       LORazepam (ATIVAN) tablet 1 mg  1 mg Oral Q6H PRN Ardis Hughs, NP       LORazepam (ATIVAN) tablet 1 mg  1 mg Oral BID Ardis Hughs, NP   1 mg at 08/02/23 4696   Followed by   Melene Muller ON 08/03/2023] LORazepam (ATIVAN) tablet 1 mg  1 mg Oral Daily Ardis Hughs, NP       magnesium hydroxide (MILK OF MAGNESIA) suspension 30 mL  30 mL Oral Daily PRN Ardis Hughs, NP       multivitamin with minerals tablet 1 tablet  1 tablet Oral Daily Ardis Hughs, NP   1 tablet at 08/02/23 0851   ondansetron (ZOFRAN-ODT) disintegrating tablet 4 mg  4 mg Oral Q6H PRN Ardis Hughs, NP       pantoprazole (PROTONIX) EC tablet 40 mg  40 mg Oral Daily Ardis Hughs, NP   40 mg at 08/02/23 2952    thiamine (VITAMIN B1) tablet 100 mg  100 mg Oral Daily Ardis Hughs, NP   100 mg at 08/02/23 0851   traZODone (DESYREL) tablet 50 mg  50 mg Oral QHS PRN Ardis Hughs, NP       PTA Medications: Medications Prior to Admission  Medication Sig Dispense Refill Last Dose   omeprazole (PRILOSEC OTC) 20 MG tablet Take 20 mg by mouth daily.      ondansetron (ZOFRAN-ODT) 4 MG disintegrating tablet Take 4 mg by mouth every 8 (eight) hours as needed. (Patient not taking: Reported on 07/31/2023)      traZODone (DESYREL) 50 MG tablet Take 2 tablets (100 mg total) by mouth at bedtime as needed for sleep. 60 tablet 3     Patient Stressors: Financial difficulties   Marital or family conflict   Medication change or noncompliance    Patient Strengths: Ability for insight  Motivation for treatment/growth  Supportive family/friends   Treatment Modalities: Medication Management, Group therapy, Case management,  1 to 1 session with clinician, Psychoeducation, Recreational therapy.   Physician Treatment Plan for Primary  Diagnosis: MDD (major depressive disorder), severe (HCC) Long Term Goal(s): Improvement in symptoms so as ready for discharge   Short Term Goals: Ability to identify changes in lifestyle to reduce recurrence of condition will improve Ability to verbalize feelings will improve Ability to demonstrate self-control will improve Ability to identify and develop effective coping behaviors will improve Ability to identify triggers associated with substance abuse/mental health issues will improve  Medication Management: Evaluate patient's response, side effects, and tolerance of medication regimen.  Therapeutic Interventions: 1 to 1 sessions, Unit Group sessions and Medication administration.  Evaluation of Outcomes: Progressing  Physician Treatment Plan for Secondary Diagnosis: Principal Problem:   MDD (major depressive disorder), severe (HCC)  Long Term Goal(s): Improvement  in symptoms so as ready for discharge   Short Term Goals: Ability to identify changes in lifestyle to reduce recurrence of condition will improve Ability to verbalize feelings will improve Ability to demonstrate self-control will improve Ability to identify and develop effective coping behaviors will improve Ability to identify triggers associated with substance abuse/mental health issues will improve     Medication Management: Evaluate patient's response, side effects, and tolerance of medication regimen.  Therapeutic Interventions: 1 to 1 sessions, Unit Group sessions and Medication administration.  Evaluation of Outcomes: Progressing   RN Treatment Plan for Primary Diagnosis: MDD (major depressive disorder), severe (HCC) Long Term Goal(s): Knowledge of disease and therapeutic regimen to maintain health will improve  Short Term Goals: Ability to demonstrate self-control, Ability to participate in decision making will improve, Ability to verbalize feelings will improve, Ability to disclose and discuss suicidal ideas, Ability to identify and develop effective coping behaviors will improve, and Compliance with prescribed medications will improve  Medication Management: RN will administer medications as ordered by provider, will assess and evaluate patient's response and provide education to patient for prescribed medication. RN will report any adverse and/or side effects to prescribing provider.  Therapeutic Interventions: 1 on 1 counseling sessions, Psychoeducation, Medication administration, Evaluate responses to treatment, Monitor vital signs and CBGs as ordered, Perform/monitor CIWA, COWS, AIMS and Fall Risk screenings as ordered, Perform wound care treatments as ordered.  Evaluation of Outcomes: Progressing   LCSW Treatment Plan for Primary Diagnosis: MDD (major depressive disorder), severe (HCC) Long Term Goal(s): Safe transition to appropriate next level of care at discharge, Engage  patient in therapeutic group addressing interpersonal concerns.  Short Term Goals: Engage patient in aftercare planning with referrals and resources, Increase social support, Increase ability to appropriately verbalize feelings, Increase emotional regulation, Facilitate acceptance of mental health diagnosis and concerns, and Increase skills for wellness and recovery  Therapeutic Interventions: Assess for all discharge needs, 1 to 1 time with Social worker, Explore available resources and support systems, Assess for adequacy in community support network, Educate family and significant other(s) on suicide prevention, Complete Psychosocial Assessment, Interpersonal group therapy.  Evaluation of Outcomes: Progressing   Progress in Treatment: Attending groups: Yes. Participating in groups: Yes. Taking medication as prescribed: Yes. Toleration medication: Yes. Family/Significant other contact made: No, will contact:  once permission has been provided.  Patient understands diagnosis: Yes. Discussing patient identified problems/goals with staff: Yes. Medical problems stabilized or resolved: Yes. Denies suicidal/homicidal ideation: Yes. Issues/concerns per patient self-inventory: No. Other: none  New problem(s) identified: No, Describe:  none  New Short Term/Long Term Goal(s): detox, elimination of symptoms of psychosis, medication management for mood stabilization; elimination of SI thoughts; development of comprehensive mental wellness/sobriety plan.   Patient Goals:  "get back on track"  Discharge Plan or Barriers: CSW to assist the patient in development of appropriate discharge plans.  Patient reports plans to return to his home.  He is requesting assistance with an aftercare provider.   Reason for Continuation of Hospitalization: Anxiety Depression Medication stabilization Suicidal ideation  Estimated Length of Stay:  1-7 days  Last 3 [place] Suicide Severity Risk Score: Flowsheet  Row Admission (Current) from 08/01/2023 in [facility] ED from 07/31/2023 in [facility] ED from 09/10/2022 in [facility] Emergency Department [facility]  C-SSRS RISK CATEGORY High Risk Moderate Risk No Risk       Last [facility] 2/9 Scores:    06/18/2019    1:27 PM  Depression screen PHQ 2/9  Decreased Interest 1  Down, Depressed, Hopeless 1  PHQ - 2 Score 2  Altered sleeping 1  Tired, decreased energy 1  Change in appetite 2  Feeling bad or failure about yourself  1  Trouble concentrating 0  Moving slowly or fidgety/restless 2  Suicidal thoughts 1  PHQ-9 Score 10  Difficult doing work/chores Somewhat difficult    Scribe for Treatment Team: Harden Mo, LCSW 08/02/2023 10:26 AM

## 2023-08-02 NOTE — Group Note (Signed)
Date:  08/02/2023 Time:  5:28 PM  Group Topic/Focus:  Wellness Toolbox:   The focus of this group is to discuss various aspects of wellness, balancing those aspects and exploring ways to increase the ability to experience wellness.  Patients will create a wellness toolbox for use upon discharge.    Participation Level:  Active  Participation Quality:  Appropriate  Affect:  Appropriate  Cognitive:  Appropriate  Insight: Appropriate  Engagement in Group:  Engaged  Modes of Intervention:  Activity  Additional Comments:    Wilford Corner 08/02/2023, 5:28 PM

## 2023-08-02 NOTE — Group Note (Signed)
Date:  08/02/2023 Time:  10:24 AM  Group Topic/Focus:  Goals Group:   The focus of this group is to help patients establish daily goals to achieve during treatment and discuss how the patient can incorporate goal setting into their daily lives to aide in recovery.    Participation Level:  Active  Participation Quality:  Appropriate  Affect:  Appropriate  Cognitive:  Appropriate  Insight: Appropriate  Engagement in Group:  Engaged  Modes of Intervention:  Discussion, Education, and Support  Additional Comments:    Wilford Corner 08/02/2023, 10:24 AM
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
## 2023-08-03 DIAGNOSIS — F332 Major depressive disorder, recurrent severe without psychotic features: Secondary | ICD-10-CM | POA: Diagnosis not present

## 2023-08-03 NOTE — Group Note (Signed)
[facility] LCSW Group Therapy Note   Group Date: 08/03/2023 Start Time: 1315 End Time: 1430  Type of Therapy and Topic:  Group Therapy:  Feelings around Relapse and Recovery  Participation Level:  Active   Mood:  Description of Group:    Patients in this group will discuss emotions they experience before and after a relapse. They will process how experiencing these feelings, or avoidance of experiencing them, relates to having a relapse. Facilitator will guide patients to explore emotions they have related to recovery. Patients will be encouraged to process which emotions are more powerful. They will be guided to discuss the emotional reaction significant others in their lives may have to patients' relapse or recovery. Patients will be assisted in exploring ways to respond to the emotions of others without this contributing to a relapse.  Therapeutic Goals: Patient will identify two or more emotions that lead to relapse for them:  Patient will identify two emotions that result when they relapse:  Patient will identify two emotions related to recovery:  Patient will demonstrate ability to communicate their needs through discussion and/or role plays.   Summary of Patient Progress: Patient was present in group.  Patient was active and supportive of others.  Patient was able to provide effective feedback to the others in group.  Pt expressed knowledge of grounding exercises.  Patient displayed fair insight.    Therapeutic Modalities:   Cognitive Behavioral Therapy Solution-Focused Therapy Assertiveness Training Relapse Prevention Therapy   Harden Mo, LCSW

## 2023-08-03 NOTE — Progress Notes (Signed)
D- Patient alert and oriented x 4. Affect anxious and and bright/mood anxious and pleasant. Denies SI/ HI/ AVH. Patient denies pain. Patient endorses mild anxiety. He states is goal is today is to work on figuring out things to return back home with his mother who is getting "dementia". Discussed Ativan taper as it's association with COWS score. A- Scheduled medications administered to patient, per MD orders. Support and encouragement provided.  Routine safety checks conducted every 15 minutes without incident.  Patient informed to notify staff with problems or concerns and verbalizes understanding. R- No adverse drug reactions noted.  Patient compliant with medications and treatment plan. Patient receptive and cooperative. He interacts well with others on the unit.  Patient contracts for safety and  remains safe on the unit at this time.

## 2023-08-03 NOTE — Progress Notes (Signed)
[facility] Progress Note  08/03/2023 12:53 PM Brett Sanford  MRN:  [id number]  Principal Problem: Major depressive disorder, recurrent severe without psychotic features (HCC) Diagnosis: Principal Problem:   Major depressive disorder, recurrent severe without psychotic features (HCC)  Total Time spent with patient: 30 minutes  Past Psychiatric History: Depression, anxiety, ADHD, alcohol abuse disorder  Subjective: 32 y/o male presents with suicidal ideation, history of alcohol abuse, ADHD, depression and anxiety. The client has been socializing in the milieu or reading during the day.  His depression is a 3/10 with 10 being the highest, no suicidal ideations, "Not feeling like hurting myself anymore."  Anxiety is low to minimal, no panic attacks.  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  Appetite is "fine".  Denies side effects from his medications. Slight tremors with withdrawal from alcohol.  Past Medical History:  Past Medical History:  Diagnosis Date   ADD (attention deficit disorder)    Drug abuse (HCC)    Medical history non-contributory     Past Surgical History:  Procedure Laterality Date   NO PAST SURGERIES     Family History:  Family History  Family history unknown: Yes   Family Psychiatric  History: Mothers sister- BPD Social History:  Social History   Substance and Sexual Activity  Alcohol Use Yes   Alcohol/week: 8.0 standard drinks of alcohol   Types: 8 Shots of liquor per week   Comment: a few shots/5-6 days/week     Social History   Substance and Sexual Activity  Drug Use Yes   Types: Marijuana, Methamphetamines   Comment: once a week    Social History   Socioeconomic History   Marital status: Single    Spouse name: Not on file   Number of children: Not on file   Years of education: Not on file   Highest education level: Not on file  Occupational History   Not on file  Tobacco Use   Smoking status: Every Day     Current packs/day: 1.50    Average packs/day: 1.5 packs/day for 5.0 years (7.5 ttl pk-yrs)    Types: Cigarettes   Smokeless tobacco: Never   Tobacco comments:    1-2 packs per day/5 years  Substance and Sexual Activity   Alcohol use: Yes    Alcohol/week: 8.0 standard drinks of alcohol    Types: 8 Shots of liquor per week    Comment: a few shots/5-6 days/week   Drug use: Yes    Types: Marijuana, Methamphetamines    Comment: once a week   Sexual activity: Yes  Other Topics Concern   Not on file  Social History Narrative   Pt employed and living with mother who he identifies as his main social support. Pt has not taken his meds for the last 6 months. Pt has trouble getting medications because he does not have insurance.   Social Determinants of Health   Financial Resource Strain: Low Risk  (09/29/2022)   Received from [facility], [facility]   Overall Financial Resource Strain (CARDIA)    Difficulty of Paying Living Expenses: Not very hard  Food Insecurity: No Food Insecurity (08/01/2023)   Hunger Vital Sign    Worried About Running Out of Food in the Last Year: Never true    Ran Out of Food in the Last Year: Never true  Transportation Needs: No Transportation Needs (08/01/2023)   PRAPARE - Transportation  Lack of Transportation (Medical): No    Lack of Transportation (Non-Medical): No  Physical Activity: Sufficiently Active (09/29/2022)   Received from [facility], [facility]   Exercise Vital Sign    Days of Exercise per Week: 3 days    Minutes of Exercise per Session: 120 min  Stress: Stress Concern Present (09/29/2022)   Received from [facility], [facility] Occupational [facility] - Occupational Stress Questionnaire    Feeling of Stress : To some extent  Social Connections: Socially Isolated (09/29/2022)   Received from [facility], [facility]   Social Connection and Isolation Panel [NHANES]    Frequency of  Communication with Friends and Family: More than three times a week    Frequency of Social Gatherings with Friends and Family: More than three times a week    Attends Religious Services: Never    Database administrator or Organizations: No    Attends Engineer, structural: Never    Marital Status: Never married    Current Medications: Current Facility-Administered Medications  Medication Dose Route Frequency Provider Last Rate Last Admin   acetaminophen (TYLENOL) tablet 650 mg  650 mg Oral Q6H PRN Ardis Hughs, NP   650 mg at 08/03/23 1033   alum & mag hydroxide-simeth (MAALOX/MYLANTA) 200-200-20 MG/5ML suspension 30 mL  30 mL Oral Q4H PRN Ardis Hughs, NP       haloperidol (HALDOL) tablet 5 mg  5 mg Oral TID PRN Ardis Hughs, NP       Or   haloperidol lactate (HALDOL) injection 5 mg  5 mg Intramuscular TID PRN Ardis Hughs, NP       LORazepam (ATIVAN) tablet 2 mg  2 mg Oral TID PRN Ardis Hughs, NP       Or   LORazepam (ATIVAN) injection 2 mg  2 mg Intramuscular TID PRN Ardis Hughs, NP       magnesium hydroxide (MILK OF MAGNESIA) suspension 30 mL  30 mL Oral Daily PRN Ardis Hughs, NP       multivitamin with minerals tablet 1 tablet  1 tablet Oral Daily Ardis Hughs, NP   1 tablet at 08/03/23 0828   pantoprazole (PROTONIX) EC tablet 40 mg  40 mg Oral Daily Ardis Hughs, NP   40 mg at 08/03/23 2956   thiamine (VITAMIN B1) tablet 100 mg  100 mg Oral Daily Ardis Hughs, NP   100 mg at 08/03/23 2130   traZODone (DESYREL) tablet 50 mg  50 mg Oral QHS PRN Ardis Hughs, NP   50 mg at 08/02/23 2120    Lab Results:  No results found for this or any previous visit (from the past 48 hour(s)).   Blood Alcohol level:  Lab Results  Component Value Date   ETH 147 (H) 07/31/2023   ETH <10 04/15/2020    Metabolic Disorder Labs: Lab Results  Component Value Date   HGBA1C 4.9 07/31/2023   MPG 93.93 07/31/2023   MPG  96.8 04/15/2020   No results found for: "PROLACTIN" Lab Results  Component Value Date   CHOL 317 (H) 07/31/2023   TRIG 241 (H) 07/31/2023   HDL 110 07/31/2023   CHOLHDL 2.9 07/31/2023   VLDL 48 (H) 07/31/2023   LDLCALC 159 (H) 07/31/2023   LDLCALC NOT CALCULATED 04/15/2020    Physical Findings: AIMS:  , ,  ,  ,  CIWA:  CIWA-Ar Total: 1 COWS:     Musculoskeletal: Strength & Muscle Tone: within normal limits Gait & Station: normal Patient leans: N/A  Psychiatric Specialty Exam: Physical Exam Vitals and nursing note reviewed.  Constitutional:      Appearance: Normal appearance.  HENT:     Head: Normocephalic.     Nose: Nose normal.  Pulmonary:     Effort: Pulmonary effort is normal.  Musculoskeletal:        General: Normal range of motion.     Cervical back: Normal range of motion.  Neurological:     General: No focal deficit present.     Mental Status: He is alert and oriented to person, place, and time.     Review of Systems  Psychiatric/Behavioral:  Positive for depression and substance abuse. The patient is nervous/anxious.     Blood pressure (!) 135/101, pulse 86, temperature 98.8 F (37.1 C), resp. rate (!) 22, height 5\' 8"  (1.727 m), weight 93.4 kg, SpO2 100%.Body mass index is 31.32 kg/m.  General Appearance: Fairly Groomed  Eye Contact:  Good  Speech:  Normal Rate  Volume:  Normal  Mood:  Depressed  Affect:  Congruent and Depressed, anixety  Thought Process:  Coherent  Orientation:  Full (Time, Place, and Person)  Thought Content:  WDL  Suicidal Thoughts:  No  Homicidal Thoughts:  No  Memory:  Immediate;   Good Recent;   Good Remote;   Good  Judgement:  Good  Insight:  Good  Psychomotor Activity:  Normal  Concentration:  Concentration: Good and Attention Span: Good  Recall:  Good  Fund of Knowledge:  Good  Language:  Good  Akathisia:  Negative  Handed:  Right  AIMS (if indicated):     Assets:  Communication Skills Desire for  Improvement Housing Physical Health Resilience  ADL's:  Intact  Cognition:  WNL  Sleep:         Physical Exam: Physical Exam Vitals and nursing note reviewed.  Constitutional:      Appearance: Normal appearance.  HENT:     Head: Normocephalic.     Nose: Nose normal.  Pulmonary:     Effort: Pulmonary effort is normal.  Musculoskeletal:        General: Normal range of motion.     Cervical back: Normal range of motion.  Neurological:     General: No focal deficit present.     Mental Status: He is alert and oriented to person, place, and time.    Review of Systems  Psychiatric/Behavioral:  Positive for depression and substance abuse. The patient is nervous/anxious.    Blood pressure (!) 135/101, pulse 86, temperature 98.8 F (37.1 C), resp. rate (!) 22, height 5\' 8"  (1.727 m), weight 93.4 kg, SpO2 100%. Body mass index is 31.32 kg/m.   Treatment Plan Summary: Daily contact with patient to assess and evaluate symptoms and progress in treatment, Medication management, and Plan Monitor labs, CIWA pathway Major depressive disorder, recurrent, severe without psychosis: He would rather focus on therapy  Alcohol use d/o: Ativan detox protocol  Insomnia: Trazodone 50 mg daily at bedtime PRN  Nanine Means, NP 08/03/2023, 12:53 PM

## 2023-08-03 NOTE — Group Note (Signed)
Date:  08/03/2023 Time:  10:03 AM  Group Topic/Focus:  Early Warning Signs:   The focus of this group is to help patients identify signs or symptoms they exhibit before slipping into an unhealthy state or crisis.    Participation Level:  Active  Participation Quality:  Appropriate  Affect:  Appropriate  Cognitive:  Appropriate  Insight: Appropriate  Engagement in Group:  Engaged  Modes of Intervention:  Activity  Additional Comments:    Brett Sanford [name] 08/03/2023, 10:03 AM

## 2023-08-03 NOTE — Group Note (Signed)
Recreation Therapy Group Note   Group Topic:Relaxation  Group Date: 08/03/2023 Start Time: 1000 End Time: 1045 Facilitators: Rosina Lowenstein, LRT, CTRS Location:  Craft Room  Group Description: PMR (Progressive Muscle Relaxation). LRT asks patients their current level of stress/anxiety from 1-10, with 10 being the highest. LRT educates patients on what PMR is and the benefits that come from it. Patients are asked to sit with their feet flat on the floor while sitting up and all the way back in their chair, if possible. LRT and pts follow a prompt through a speaker that requires you to tense and release different muscles in their body and focus on their breathing. During session, lights are off and soft music is being played. Pts are given a stress ball to use if needed. At the end of the prompt, LRT asks patients to rank their current levels of stress/anxiety from 1-10, 10 being the highest. LRT provides patients with an education handout on PMR.   Goal Area(s) Addressed:  Patients will be able to describe progressive muscle relaxation.  Patient will practice using relaxation technique. Patient will identify a new coping skill.  Patient will follow multistep directions to reduce anxiety and stress.   Affect/Mood: Appropriate   Participation Level: Active and Engaged   Participation Quality: Independent   Behavior: Calm and Cooperative   Speech/Thought Process: Coherent   Insight: Good   Judgement: Good   Modes of Intervention: Activity   Patient Response to Interventions:  Attentive, Engaged, Interested , and Receptive   Education Outcome:  Acknowledges education   Clinical Observations/Individualized Feedback: Brett Sanford was active in their participation of session activities and group discussion. Pt identified that his anxiety and stress were a 4 before the session. Afterwards, he rates them a 2. Pt interacted well with LRT and peers duration of session.   Plan: Continue to  engage patient in RT group sessions 2-3x/week.   Rosina Lowenstein, LRT, CTRS 08/03/2023 11:22 AM

## 2023-08-03 NOTE — Plan of Care (Signed)
Problem: Education: Goal: Knowledge of General Education information will improve Description: Including pain rating scale, medication(s)/side effects and non-pharmacologic comfort measures 08/03/2023 1025 by Delos Haring, RN Outcome: Progressing 08/03/2023 1024 by Delos Haring, RN Outcome: Progressing   Problem: Health Behavior/Discharge Planning: Goal: Ability to manage health-related needs will improve 08/03/2023 1025 by Delos Haring, RN Outcome: Progressing 08/03/2023 1024 by Delos Haring, RN Outcome: Progressing   Problem: Clinical Measurements: Goal: Ability to maintain clinical measurements within normal limits will improve 08/03/2023 1025 by Delos Haring, RN Outcome: Progressing 08/03/2023 1024 by Delos Haring, RN Outcome: Progressing Goal: Will remain free from infection 08/03/2023 1025 by Delos Haring, RN Outcome: Progressing 08/03/2023 1024 by Delos Haring, RN Outcome: Progressing Goal: Diagnostic test results will improve 08/03/2023 1025 by Delos Haring, RN Outcome: Progressing 08/03/2023 1024 by Delos Haring, RN Outcome: Progressing Goal: Respiratory complications will improve 08/03/2023 1025 by Delos Haring, RN Outcome: Progressing 08/03/2023 1024 by Delos Haring, RN Outcome: Progressing Goal: Cardiovascular complication will be avoided 08/03/2023 1025 by Delos Haring, RN Outcome: Progressing 08/03/2023 1024 by Delos Haring, RN Outcome: Progressing   Problem: Activity: Goal: Risk for activity intolerance will decrease 08/03/2023 1025 by Delos Haring, RN Outcome: Progressing 08/03/2023 1024 by Delos Haring, RN Outcome: Progressing   Problem: Nutrition: Goal: Adequate nutrition will be maintained 08/03/2023 1025 by Delos Haring, RN Outcome: Progressing 08/03/2023 1024 by Delos Haring, RN Outcome: Progressing   Problem: Coping: Goal: Level of anxiety will decrease 08/03/2023 1025 by Delos Haring, RN Outcome: Progressing 08/03/2023 1024 by Delos Haring, RN Outcome: Progressing   Problem: Elimination: Goal: Will not experience complications related to bowel motility 08/03/2023 1025 by Delos Haring, RN Outcome: Progressing 08/03/2023 1024 by Delos Haring, RN Outcome: Progressing Goal: Will not experience complications related to urinary retention 08/03/2023 1025 by Delos Haring, RN Outcome: Progressing 08/03/2023 1024 by Delos Haring, RN Outcome: Progressing   Problem: Pain Managment: Goal: General experience of comfort will improve 08/03/2023 1025 by Delos Haring, RN Outcome: Progressing 08/03/2023 1024 by Delos Haring, RN Outcome: Progressing   Problem: Safety: Goal: Ability to remain free from injury will improve 08/03/2023 1025 by Delos Haring, RN Outcome: Progressing 08/03/2023 1024 by Delos Haring, RN Outcome: Progressing   Problem: Skin Integrity: Goal: Risk for impaired skin integrity will decrease 08/03/2023 1025 by Delos Haring, RN Outcome: Progressing 08/03/2023 1024 by Delos Haring, RN Outcome: Progressing   Problem: Education: Goal: Knowledge of  General Education information/materials will improve 08/03/2023 1025 by Delos Haring, RN Outcome: Progressing 08/03/2023 1024 by Delos Haring, RN Outcome: Progressing Goal: Emotional status will improve 08/03/2023 1025 by Delos Haring, RN Outcome: Progressing 08/03/2023 1024 by Delos Haring, RN Outcome: Progressing Goal: Mental status will improve 08/03/2023 1025 by Delos Haring, RN Outcome: Progressing 08/03/2023 1024 by Delos Haring, RN Outcome: Progressing Goal: Verbalization of understanding the information provided will improve 08/03/2023 1025 by Delos Haring, RN Outcome: Progressing 08/03/2023 1024 by Delos Haring, RN Outcome: Progressing   Problem: Activity: Goal: Interest or engagement in activities will  improve 08/03/2023 1025 by Delos Haring, RN Outcome: Progressing 08/03/2023 1024 by Delos Haring, RN Outcome: Progressing Goal: Sleeping patterns will improve 08/03/2023 1025 by Delos Haring, RN Outcome: Progressing 08/03/2023 1024 by Delos Haring, RN Outcome: Progressing   Problem: Coping: Goal:  Ability to verbalize frustrations and anger appropriately will improve 08/03/2023 1025 by Delos Haring, RN Outcome: Progressing 08/03/2023 1024 by Delos Haring, RN Outcome: Progressing Goal: Ability to demonstrate self-control will improve 08/03/2023 1025 by Delos Haring, RN Outcome: Progressing 08/03/2023 1024 by Delos Haring, RN Outcome: Progressing   Problem: Health Behavior/Discharge Planning: Goal: Identification of resources available to assist in meeting health care needs will improve 08/03/2023 1025 by Delos Haring, RN Outcome: Progressing 08/03/2023 1024 by Delos Haring, RN Outcome: Progressing Goal: Compliance with treatment plan for underlying cause of condition will improve 08/03/2023 1025 by Delos Haring, RN Outcome: Progressing 08/03/2023 1024 by Delos Haring, RN Outcome: Progressing   Problem: Physical Regulation: Goal: Ability to maintain clinical measurements within normal limits will improve 08/03/2023 1025 by Delos Haring, RN Outcome: Progressing 08/03/2023 1024 by Delos Haring, RN Outcome: Progressing   Problem: Safety: Goal: Periods of time without injury will increase 08/03/2023 1025 by Delos Haring, RN Outcome: Progressing 08/03/2023 1024 by Delos Haring, RN Outcome: Progressing

## 2023-08-03 NOTE — Progress Notes (Signed)
Patient continues to present anxious, pleasant and cooperative. Denies SI, HI, AVh. Medication compliant. Appropriate with staff and peers. tremors seen and felt in hands. Encouragement and support provided. Safety checks maintained. Medications given as prescribed. Pt receptive and remains safe on unit with q 15 min checks.

## 2023-08-03 NOTE — Group Note (Signed)
Date:  08/03/2023 Time:  8:54 PM  Group Topic/Focus:  Goals Group:   The focus of this group is to help patients establish daily goals to achieve during treatment and discuss how the patient can incorporate goal setting into their daily lives to aide in recovery.    Participation Level:  Active  Participation Quality:  Appropriate, Sharing, and Supportive  Affect:  Appropriate  Cognitive:  Appropriate  Insight: Appropriate and Good  Engagement in Group:  Supportive  Modes of Intervention:  Support  Additional Comments:     Belva Crome 08/03/2023, 8:54 PM

## 2023-08-04 DIAGNOSIS — F332 Major depressive disorder, recurrent severe without psychotic features: Secondary | ICD-10-CM | POA: Diagnosis not present

## 2023-08-04 MED ORDER — HYDROXYZINE HCL 25 MG PO TABS
25.0000 mg | ORAL_TABLET | Freq: Three times a day (TID) | ORAL | Status: DC | PRN
  Administered 2023-08-04 – 2023-08-06 (×7): 25 mg via ORAL
  Filled 2023-08-04 (×8): qty 1

## 2023-08-04 MED ORDER — GABAPENTIN 300 MG PO CAPS
300.0000 mg | ORAL_CAPSULE | Freq: Three times a day (TID) | ORAL | Status: DC
  Administered 2023-08-04 – 2023-08-06 (×8): 300 mg via ORAL
  Filled 2023-08-04 (×8): qty 1

## 2023-08-04 NOTE — Group Note (Signed)
Recreation Therapy Group Note   Group Topic:Leisure Education  Group Date: 08/04/2023 Start Time: 1015 End Time: 1120 Facilitators: Rosina Lowenstein, LRT, CTRS Location:  Craft Room  Group Description: Leisure. Patients were given the option to choose from singing karaoke, coloring mandalas, using oil pastels, journaling, or playing with play-doh. LRT and pts discussed the meaning of leisure, the importance of participating in leisure during their free time/when they're outside of the hospital, as well as how our leisure interests can also serve as coping skills.   Goal Area(s) Addressed:  Patient will identify a current leisure interest.  Patient will learn the definition of "leisure". Patient will practice making a positive decision. Patient will have the opportunity to try a new leisure activity. Patient will communicate with peers and LRT.    Affect/Mood: Appropriate   Participation Level: Active and Engaged   Participation Quality: Independent   Behavior: Calm and Cooperative   Speech/Thought Process: Coherent   Insight: Good   Judgement: Good   Modes of Intervention: Activity   Patient Response to Interventions:  Attentive, Engaged, Interested , and Receptive   Education Outcome:  Acknowledges education   Clinical Observations/Individualized Feedback: Brett Sanford was active in their participation of session activities and group discussion. Pt identified "anything outdoors and cooking" as things he does in his free time. Pt chose to play with play-doh while in group. Pt was noticeably very shaky and said this was due to withdrawal and that he had received medicine and the nurse was aware. Pt interacted well with LRT and peers duration of session.   Plan: Continue to engage patient in RT group sessions 2-3x/week.   Rosina Lowenstein, LRT, CTRS 08/04/2023 12:19 PM

## 2023-08-04 NOTE — Plan of Care (Signed)
D- Patient alert and oriented. Patient presented in an anxious, but pleasant mood on assessment reporting that he slept fair last night and had complaints of a headache. Patient rated his pain an "8/10", in which he requested PRN pain medication to help with relief. However, patient stated that it wasn't effective. Patient endorsed hopelessness, depression, and anxiety on his self-inventory, but when this writer asked him about this, he stated "other than just going through some withdrawals, I'm fine". Patient denied SI, HI, AVH, at this time. Patient's goal for today, per his self-inventory is to "stay positive", in which he will "stay active", in order to achieve his goal.  A- Scheduled medications administered to patient, per MD orders. Support and encouragement provided. Routine safety checks conducted every 15 minutes. Patient informed to notify staff with problems or concerns.  R- No adverse drug reactions noted. Patient contracts for safety at this time. Patient compliant with medications and treatment plan. Patient receptive, calm, and cooperative. Patient interacts well with others on the unit. Patient remains safe at this time.  Problem: Education: Goal: Knowledge of General Education information will improve Description: Including pain rating scale, medication(s)/side effects and non-pharmacologic comfort measures Outcome: Progressing   Problem: Education: Goal: Knowledge of General Education information will improve Description: Including pain rating scale, medication(s)/side effects and non-pharmacologic comfort measures Outcome: Progressing   Problem: Health Behavior/Discharge Planning: Goal: Ability to manage health-related needs will improve Outcome: Progressing   Problem: Clinical Measurements: Goal: Ability to maintain clinical measurements within normal limits will improve Outcome: Progressing Goal: Will remain free from infection Outcome: Progressing Goal: Diagnostic test  results will improve Outcome: Progressing Goal: Respiratory complications will improve Outcome: Progressing Goal: Cardiovascular complication will be avoided Outcome: Progressing   Problem: Activity: Goal: Risk for activity intolerance will decrease Outcome: Progressing   Problem: Nutrition: Goal: Adequate nutrition will be maintained Outcome: Progressing   Problem: Coping: Goal: Level of anxiety will decrease Outcome: Progressing   Problem: Elimination: Goal: Will not experience complications related to bowel motility Outcome: Progressing Goal: Will not experience complications related to urinary retention Outcome: Progressing   Problem: Pain Managment: Goal: General experience of comfort will improve Outcome: Progressing   Problem: Safety: Goal: Ability to remain free from injury will improve Outcome: Progressing   Problem: Skin Integrity: Goal: Risk for impaired skin integrity will decrease Outcome: Progressing   Problem: Education: Goal: Knowledge of [place] General Education information/materials will improve Outcome: Progressing Goal: Emotional status will improve Outcome: Progressing Goal: Mental status will improve Outcome: Progressing Goal: Verbalization of understanding the information provided will improve Outcome: Progressing   Problem: Activity: Goal: Interest or engagement in activities will improve Outcome: Progressing Goal: Sleeping patterns will improve Outcome: Progressing   Problem: Coping: Goal: Ability to verbalize frustrations and anger appropriately will improve Outcome: Progressing Goal: Ability to demonstrate self-control will improve Outcome: Progressing   Problem: Health Behavior/Discharge Planning: Goal: Identification of resources available to assist in meeting health care needs will improve Outcome: Progressing Goal: Compliance with treatment plan for underlying cause of condition will improve Outcome: Progressing    Problem: Physical Regulation: Goal: Ability to maintain clinical measurements within normal limits will improve Outcome: Progressing   Problem: Safety: Goal: Periods of time without injury will increase Outcome: Progressing

## 2023-08-04 NOTE — Group Note (Signed)
Date:  08/04/2023 Time:  9:25 PM  Group Topic/Focus:  Wrap-Up Group:   The focus of this group is to help patients review their daily goal of treatment and discuss progress on daily workbooks.    Participation Level:  Active  Participation Quality:  Appropriate, Attentive, Sharing, and Supportive  Affect:  Appropriate  Cognitive:  Appropriate  Insight: Appropriate and Good  Engagement in Group:  Engaged and Supportive  Modes of Intervention:  Support  Additional Comments:     Belva Crome 08/04/2023, 9:25 PM

## 2023-08-04 NOTE — Progress Notes (Signed)
   08/04/23 1800  CIWA-Ar  Nausea and Vomiting 0  Tactile Disturbances 0  Tremor 2  Auditory Disturbances 0  Paroxysmal Sweats 0  Visual Disturbances 0  Anxiety 1  Headache, Fullness in Head 3  Agitation 0  Orientation and Clouding of Sensorium 0  CIWA-Ar Total 6

## 2023-08-04 NOTE — Group Note (Signed)
Date:  08/04/2023 Time:  5:39 PM  Group Topic/Focus:  Activity Group:  The focus of the group is to promote activity and encourage the patients to go outside to the courtyard and get some fresh air and some exercise.    Participation Level:  Active  Participation Quality:  Appropriate  Affect:  Appropriate  Cognitive:  Appropriate  Insight: Appropriate  Engagement in Group:  Engaged  Modes of Intervention:  Activity  Additional Comments:    Brett Sanford 08/04/2023, 5:39 PM

## 2023-08-04 NOTE — Progress Notes (Signed)
Brett Sanford  08/04/2023 11:05 AM Brett Sanford  MRN:  [id number]  Principal Problem: Major depressive disorder, recurrent severe without psychotic features (HCC) Diagnosis: Principal Problem:   Major depressive disorder, recurrent severe without psychotic features (HCC)  Total Time spent with patient: 30 minutes  Past Psychiatric History: Depression, anxiety, ADHD, alcohol abuse disorder  Subjective: 32 y/o male presents with suicidal ideation, history of alcohol abuse, ADHD, depression and anxiety. The client has been socializing in the milieu or watching television during the day.  Client reports tremors, night sweats and a headache.  PRNs in place and gabapentin ordered to assist. Anxiety 4/10, "that Vistaril really helped", no panic attacks. Depression 4/10, no suicidal ideations. Reports sleep is fair, "The Trazodone helps me fall asleep but then I will wake up throughout the night for hours at a time.". Denies suicidal/ homicidal ideations. Denies paranoia and hallucinations. Denies medication regimen side effects.   Past Medical History:  Past Medical History:  Diagnosis Date   ADD (attention deficit disorder)    Drug abuse (HCC)    Medical history non-contributory     Past Surgical History:  Procedure Laterality Date   NO PAST SURGERIES     Family History:  Family History  Family history unknown: Yes   Family Psychiatric  History: Mothers sister- BPD Social History:  Social History   Substance and Sexual Activity  Alcohol Use Yes   Alcohol/week: 8.0 standard drinks of alcohol   Types: 8 Shots of liquor per week   Comment: a few shots/5-6 days/week     Social History   Substance and Sexual Activity  Drug Use Yes   Types: Marijuana, Methamphetamines   Comment: once a week    Social History   Socioeconomic History   Marital status: Single    Spouse name: Not on file   Number of children: Not on file   Years of education: Not on file   Highest  education level: Not on file  Occupational History   Not on file  Tobacco Use   Smoking status: Every Day    Current packs/day: 1.50    Average packs/day: 1.5 packs/day for 5.0 years (7.5 ttl pk-yrs)    Types: Cigarettes   Smokeless tobacco: Never   Tobacco comments:    1-2 packs per day/5 years  Substance and Sexual Activity   Alcohol use: Yes    Alcohol/week: 8.0 standard drinks of alcohol    Types: 8 Shots of liquor per week    Comment: a few shots/5-6 days/week   Drug use: Yes    Types: Marijuana, Methamphetamines    Comment: once a week   Sexual activity: Yes  Other Topics Concern   Not on file  Social History Narrative   Pt employed and living with mother who he identifies as his main social support. Pt has not taken his meds for the last 6 months. Pt has trouble getting medications because he does not have insurance.   Social Determinants of Health   Financial Resource Strain: Low Risk  (09/29/2022)   Received from [facility], [facility]   Overall Financial Resource Strain (CARDIA)    Difficulty of Paying Living Expenses: Not very hard  Food Insecurity: No Food Insecurity (08/01/2023)   Hunger Vital Sign    Worried About Running Out of Food in the Last Year: Never true    Ran Out of Food in the Last Year: Never true  Transportation Needs: No Transportation Needs (08/01/2023)  PRAPARE - Administrator, Civil Service (Medical): No    Lack of Transportation (Non-Medical): No  Physical Activity: Sufficiently Active (09/29/2022)   Received from [facility], [facility]   Exercise Vital Sign    Days of Exercise per Week: 3 days    Minutes of Exercise per Session: 120 min  Stress: Stress Concern Present (09/29/2022)   Received from [facility], [facility] of Occupational Health - Occupational Stress Questionnaire    Feeling of Stress : To some extent  Social Connections: Socially Isolated (09/29/2022)    Received from [facility], [facility]   Social Connection and Isolation Panel [NHANES]    Frequency of Communication with Friends and Family: More than three times a week    Frequency of Social Gatherings with Friends and Family: More than three times a week    Attends Religious Services: Never    Database administrator or Organizations: No    Attends Engineer, structural: Never    Marital Status: Never married    Current Medications: Current Facility-Administered Medications  Medication Dose Route Frequency Provider Last Rate Last Admin   acetaminophen (TYLENOL) tablet 650 mg  650 mg Oral Q6H PRN Ardis Hughs, NP   650 mg at 08/04/23 0827   alum & mag hydroxide-simeth (MAALOX/MYLANTA) 200-200-20 MG/5ML suspension 30 mL  30 mL Oral Q4H PRN Ardis Hughs, NP       gabapentin (NEURONTIN) capsule 300 mg  300 mg Oral TID Charm Rings, NP       haloperidol (HALDOL) tablet 5 mg  5 mg Oral TID PRN Ardis Hughs, NP       Or   haloperidol lactate (HALDOL) injection 5 mg  5 mg Intramuscular TID PRN Ardis Hughs, NP       hydrOXYzine (ATARAX) tablet 25 mg  25 mg Oral TID PRN Charm Rings, NP   25 mg at 08/04/23 0827   magnesium hydroxide (MILK OF MAGNESIA) suspension 30 mL  30 mL Oral Daily PRN Ardis Hughs, NP       multivitamin with minerals tablet 1 tablet  1 tablet Oral Daily Ardis Hughs, NP   1 tablet at 08/04/23 0826   pantoprazole (PROTONIX) EC tablet 40 mg  40 mg Oral Daily Ardis Hughs, NP   40 mg at 08/04/23 1610   thiamine (VITAMIN B1) tablet 100 mg  100 mg Oral Daily Ardis Hughs, NP   100 mg at 08/04/23 9604   traZODone (DESYREL) tablet 50 mg  50 mg Oral QHS PRN Ardis Hughs, NP   50 mg at 08/03/23 2105    Lab Results:  No results found for this or any previous visit (from the past 48 hour(s)).   Blood Alcohol level:  Lab Results  Component Value Date   ETH 147 (H) 07/31/2023   ETH <10 04/15/2020     Metabolic Disorder Labs: Lab Results  Component Value Date   HGBA1C 4.9 07/31/2023   MPG 93.93 07/31/2023   MPG 96.8 04/15/2020   No results found for: "PROLACTIN" Lab Results  Component Value Date   CHOL 317 (H) 07/31/2023   TRIG 241 (H) 07/31/2023   HDL 110 07/31/2023   CHOLHDL 2.9 07/31/2023   VLDL 48 (H) 07/31/2023   LDLCALC 159 (H) 07/31/2023   LDLCALC NOT CALCULATED 04/15/2020    Physical Findings: AIMS:  , ,  ,  ,  CIWA:  CIWA-Ar Total: 0 COWS:     Musculoskeletal: Strength & Muscle Tone: within normal limits Gait & Station: normal Patient leans: N/A  Psychiatric Specialty Exam: Physical Exam Vitals and nursing Sanford reviewed.  Constitutional:      Appearance: Normal appearance.  HENT:     Head: Normocephalic.     Nose: Nose normal.  Pulmonary:     Effort: Pulmonary effort is normal.  Musculoskeletal:        General: Normal range of motion.     Cervical back: Normal range of motion.  Neurological:     General: No focal deficit present.     Mental Status: He is alert and oriented to person, place, and time.     Review of Systems  Psychiatric/Behavioral:  Positive for depression and substance abuse. The patient is nervous/anxious.     Blood pressure (!) 145/99, pulse 92, temperature 97.6 F (36.4 C), resp. rate 18, height 5\' 8"  (1.727 m), weight 93.4 kg, SpO2 100%.Body mass index is 31.32 kg/m.  General Appearance: Fairly Groomed  Eye Contact:  Good  Speech:  Normal Rate  Volume:  Normal  Mood:  Anxious and Depressed  Affect:  Congruent and Depressed, anixety  Thought Process:  Coherent  Orientation:  Full (Time, Place, and Person)  Thought Content:  WDL  Suicidal Thoughts:  No  Homicidal Thoughts:  No  Memory:  Immediate;   Good Recent;   Good Remote;   Good  Judgement:  Good  Insight:  Good  Psychomotor Activity:  Normal  Concentration:  Concentration: Good and Attention Span: Good  Recall:  Good  Fund of Knowledge:  Good   Language:  Good  Akathisia:  Negative  Handed:  Right  AIMS (if indicated):     Assets:  Communication Skills Desire for Improvement Housing Physical Health Resilience  ADL's:  Intact  Cognition:  WNL  Sleep: Fair    Physical Exam: Physical Exam Vitals and nursing Sanford reviewed.  Constitutional:      Appearance: Normal appearance.  HENT:     Head: Normocephalic.     Nose: Nose normal.  Pulmonary:     Effort: Pulmonary effort is normal.  Musculoskeletal:        General: Normal range of motion.     Cervical back: Normal range of motion.  Neurological:     General: No focal deficit present.     Mental Status: He is alert and oriented to person, place, and time.    Review of Systems  Psychiatric/Behavioral:  Positive for depression and substance abuse. The patient is nervous/anxious.    Blood pressure (!) 145/99, pulse 92, temperature 97.6 F (36.4 C), resp. rate 18, height 5\' 8"  (1.727 m), weight 93.4 kg, SpO2 100%. Body mass index is 31.32 kg/m.   Treatment Plan Summary: Daily contact with patient to assess and evaluate symptoms and progress in treatment, Medication management, and Plan Monitor labs, CIWA pathway Major depressive disorder, recurrent, severe without psychosis: He would rather focus on therapy  Alcohol use d/o: Ativan detox protocol completed  Gabapentin 300mg  TID  Anxiety: Hydroxyzine 25 mg TID PRN  Insomnia: Trazodone 50 mg daily at bedtime PRN  Nanine Means, NP 08/04/2023, 11:05 AM

## 2023-08-04 NOTE — Progress Notes (Signed)
   08/04/23 1200  CIWA-Ar  Nausea and Vomiting 0  Tactile Disturbances 0  Tremor 3  Auditory Disturbances 0  Paroxysmal Sweats 0  Visual Disturbances 0  Anxiety 3  Headache, Fullness in Head 0  Agitation 0  Orientation and Clouding of Sensorium 0  CIWA-Ar Total 6

## 2023-08-05 DIAGNOSIS — F332 Major depressive disorder, recurrent severe without psychotic features: Secondary | ICD-10-CM | POA: Diagnosis not present

## 2023-08-05 NOTE — Progress Notes (Signed)
[facility] Progress Note  08/05/2023 9:40 AM Brett Sanford  MRN:  [id number]  Principal Problem: Major depressive disorder, recurrent severe without psychotic features (HCC) Diagnosis: Principal Problem:   Major depressive disorder, recurrent severe without psychotic features (HCC)  Total Time spent with patient: 30 minutes  Past Psychiatric History: Depression, anxiety, ADHD, alcohol abuse disorder  Subjective: 32 y/o male presents with suicidal ideation, history of alcohol abuse, ADHD, depression and anxiety. The client has been socializing in the milieu or watching television during the day.  Client reports "I am doing alright. I have a headache but it is better than yesterday." Rates depression 4/10 and denies suicidal ideations. Rates anxiety 6/10 that is related to discharging and life stressors, denies panic attacks. Reports "sleep was decent". Appetite is unchanged. Reports no medication side effects. Client denies homicidal ideations, hallucinations and paranoia.   Past Medical History:  Past Medical History:  Diagnosis Date   ADD (attention deficit disorder)    Drug abuse (HCC)    Medical history non-contributory     Past Surgical History:  Procedure Laterality Date   NO PAST SURGERIES     Family History:  Family History  Family history unknown: Yes   Family Psychiatric  History: Mothers sister- BPD Social History:  Social History   Substance and Sexual Activity  Alcohol Use Yes   Alcohol/week: 8.0 standard drinks of alcohol   Types: 8 Shots of liquor per week   Comment: a few shots/5-6 days/week     Social History   Substance and Sexual Activity  Drug Use Yes   Types: Marijuana, Methamphetamines   Comment: once a week    Social History   Socioeconomic History   Marital status: Single    Spouse name: Not on file   Number of children: Not on file   Years of education: Not on file   Highest education level: Not on file  Occupational History   Not on file   Tobacco Use   Smoking status: Every Day    Current packs/day: 1.50    Average packs/day: 1.5 packs/day for 5.0 years (7.5 ttl pk-yrs)    Types: Cigarettes   Smokeless tobacco: Never   Tobacco comments:    1-2 packs per day/5 years  Substance and Sexual Activity   Alcohol use: Yes    Alcohol/week: 8.0 standard drinks of alcohol    Types: 8 Shots of liquor per week    Comment: a few shots/5-6 days/week   Drug use: Yes    Types: Marijuana, Methamphetamines    Comment: once a week   Sexual activity: Yes  Other Topics Concern   Not on file  Social History Narrative   Pt employed and living with mother who he identifies as his main social support. Pt has not taken his meds for the last 6 months. Pt has trouble getting medications because he does not have insurance.   Social Determinants of Health   Financial Resource Strain: Low Risk  (09/29/2022)   Received from [facility], [facility]   Overall Financial Resource Strain (CARDIA)    Difficulty of Paying Living Expenses: Not very hard  Food Insecurity: No Food Insecurity (08/01/2023)   Hunger Vital Sign    Worried About Running Out of Food in the Last Year: Never true    Ran Out of Food in the Last Year: Never true  Transportation Needs: No Transportation Needs (08/01/2023)   PRAPARE - Administrator, Civil Service (Medical): No  Lack of Transportation (Non-Medical): No  Physical Activity: Sufficiently Active (09/29/2022)   Received from [facility], [facility]   Exercise Vital Sign    Days of Exercise per Week: 3 days    Minutes of Exercise per Session: 120 min  Stress: Stress Concern Present (09/29/2022)   Received from [facility], [facility] of Occupational Health - Occupational Stress Questionnaire    Feeling of Stress : To some extent  Social Connections: Socially Isolated (09/29/2022)   Received from [facility], [facility]   Social Connection and  Isolation Panel [NHANES]    Frequency of Communication with Friends and Family: More than three times a week    Frequency of Social Gatherings with Friends and Family: More than three times a week    Attends Religious Services: Never    Database administrator or Organizations: No    Attends Engineer, structural: Never    Marital Status: Never married    Current Medications: Current Facility-Administered Medications  Medication Dose Route Frequency Provider Last Rate Last Admin   acetaminophen (TYLENOL) tablet 650 mg  650 mg Oral Q6H PRN Ardis Hughs, NP   650 mg at 08/05/23 0838   alum & mag hydroxide-simeth (MAALOX/MYLANTA) 200-200-20 MG/5ML suspension 30 mL  30 mL Oral Q4H PRN Ardis Hughs, NP       gabapentin (NEURONTIN) capsule 300 mg  300 mg Oral TID Charm Rings, NP   300 mg at 08/05/23 0981   haloperidol (HALDOL) tablet 5 mg  5 mg Oral TID PRN Ardis Hughs, NP       Or   haloperidol lactate (HALDOL) injection 5 mg  5 mg Intramuscular TID PRN Ardis Hughs, NP       hydrOXYzine (ATARAX) tablet 25 mg  25 mg Oral TID PRN Charm Rings, NP   25 mg at 08/05/23 0840   magnesium hydroxide (MILK OF MAGNESIA) suspension 30 mL  30 mL Oral Daily PRN Ardis Hughs, NP       multivitamin with minerals tablet 1 tablet  1 tablet Oral Daily Ardis Hughs, NP   1 tablet at 08/05/23 0839   pantoprazole (PROTONIX) EC tablet 40 mg  40 mg Oral Daily Ardis Hughs, NP   40 mg at 08/05/23 1914   thiamine (VITAMIN B1) tablet 100 mg  100 mg Oral Daily Ardis Hughs, NP   100 mg at 08/05/23 7829   traZODone (DESYREL) tablet 50 mg  50 mg Oral QHS PRN Ardis Hughs, NP   50 mg at 08/04/23 2225    Lab Results:  No results found for this or any previous visit (from the past 48 hour(s)).   Blood Alcohol level:  Lab Results  Component Value Date   ETH 147 (H) 07/31/2023   ETH <10 04/15/2020    Metabolic Disorder Labs: Lab Results  Component  Value Date   HGBA1C 4.9 07/31/2023   MPG 93.93 07/31/2023   MPG 96.8 04/15/2020   No results found for: "PROLACTIN" Lab Results  Component Value Date   CHOL 317 (H) 07/31/2023   TRIG 241 (H) 07/31/2023   HDL 110 07/31/2023   CHOLHDL 2.9 07/31/2023   VLDL 48 (H) 07/31/2023   LDLCALC 159 (H) 07/31/2023   LDLCALC NOT CALCULATED 04/15/2020    Physical Findings: AIMS:  , ,  ,  ,    CIWA:  CIWA-Ar Total: 0  COWS:     Musculoskeletal: Strength & Muscle Tone: within normal limits Gait & Station: normal Patient leans: N/A  Psychiatric Specialty Exam: Physical Exam Vitals and nursing note reviewed.  Constitutional:      Appearance: Normal appearance.  HENT:     Head: Normocephalic.     Nose: Nose normal.  Pulmonary:     Effort: Pulmonary effort is normal.  Musculoskeletal:        General: Normal range of motion.     Cervical back: Normal range of motion.  Neurological:     General: No focal deficit present.     Mental Status: He is alert and oriented to person, place, and time.     Review of Systems  Psychiatric/Behavioral:  Positive for depression and substance abuse. The patient is nervous/anxious.     Blood pressure (!) 146/113, pulse 91, temperature (!) 97.2 F (36.2 C), resp. rate 18, height 5\' 8"  (1.727 m), weight 93.4 kg, SpO2 100%.Body mass index is 31.32 kg/m.  General Appearance: Fairly Groomed  Eye Contact:  Good  Speech:  Normal Rate  Volume:  Normal  Mood:  Anxious and Depressed  Affect:  Congruent and Depressed, anixety  Thought Process:  Coherent  Orientation:  Full (Time, Place, and Person)  Thought Content:  WDL  Suicidal Thoughts:  No  Homicidal Thoughts:  No  Memory:  Immediate;   Good Recent;   Good Remote;   Good  Judgement:  Good  Insight:  Good  Psychomotor Activity:  Normal  Concentration:  Concentration: Good and Attention Span: Good  Recall:  Good  Fund of Knowledge:  Good  Language:  Good  Akathisia:  Negative  Handed:  Right   AIMS (if indicated):     Assets:  Communication Skills Desire for Improvement Housing Physical Health Resilience  ADL's:  Intact  Cognition:  WNL  Sleep: Fair    Physical Exam: Physical Exam Vitals and nursing note reviewed.  Constitutional:      Appearance: Normal appearance.  HENT:     Head: Normocephalic.     Nose: Nose normal.  Pulmonary:     Effort: Pulmonary effort is normal.  Musculoskeletal:        General: Normal range of motion.     Cervical back: Normal range of motion.  Neurological:     General: No focal deficit present.     Mental Status: He is alert and oriented to person, place, and time.    Review of Systems  Psychiatric/Behavioral:  Positive for depression and substance abuse. The patient is nervous/anxious.    Blood pressure (!) 146/113, pulse 91, temperature (!) 97.2 F (36.2 C), resp. rate 18, height 5\' 8"  (1.727 m), weight 93.4 kg, SpO2 100%. Body mass index is 31.32 kg/m.   Treatment Plan Summary: Daily contact with patient to assess and evaluate symptoms and progress in treatment, Medication management, and Plan Monitor labs, CIWA pathway Major depressive disorder, recurrent, severe without psychosis: He would rather focus on therapy  Alcohol use d/o: Ativan detox protocol completed  Gabapentin 300mg  TID  Anxiety: Hydroxyzine 25 mg TID PRN  Insomnia: Trazodone 50 mg daily at bedtime PRN  Nanine Means, NP 08/05/2023, 9:40 AM

## 2023-08-05 NOTE — Group Note (Signed)
Date:  08/05/2023 Time:  8:43 PM  Group Topic/Focus:  Music therapy    Participation Level:  Active  Participation Quality:  Appropriate and Attentive  Affect:  Appropriate  Cognitive:  Alert and Appropriate  Insight: Appropriate and Good  Engagement in Group:  Developing/Improving  Modes of Intervention:  Limit-setting  Additional Comments:     Brett Sanford 08/05/2023, 8:43 PM

## 2023-08-05 NOTE — Progress Notes (Signed)
   08/04/23 2300  Psych Admission Type (Psych Patients Only)  Admission Status Voluntary  Psychosocial Assessment  Patient Complaints Anxiety  Eye Contact Fair  Facial Expression Anxious  Affect Preoccupied;Anxious  Speech Soft;Rapid  Interaction Attention-seeking  Motor Activity Fidgety  Appearance/Hygiene Unremarkable  Behavior Characteristics Cooperative;Appropriate to situation  Mood Anxious;Pleasant  Aggressive Behavior  Effect No apparent injury  Thought Process  Coherency WDL  Content WDL  Delusions None reported or observed  Perception WDL  Hallucination None reported or observed  Judgment WDL  Confusion WDL  Danger to Self  Current suicidal ideation? Denies (Denies)  Agreement Not to Harm Self Yes  Description of Agreement Verbal   Rested well throughout the night. Did not verbalize any discomfort or anxiety upon wakening.

## 2023-08-05 NOTE — Plan of Care (Signed)
  Problem: Education: Goal: Knowledge of General Education information will improve Description: Including pain rating scale, medication(s)/side effects and non-pharmacologic comfort measures Outcome: Progressing   Problem: Health Behavior/Discharge Planning: Goal: Ability to manage health-related needs will improve Outcome: Progressing   Problem: Clinical Measurements: Goal: Ability to maintain clinical measurements within normal limits will improve Outcome: Progressing Goal: Will remain free from infection Outcome: Progressing Goal: Diagnostic test results will improve Outcome: Progressing Goal: Respiratory complications will improve Outcome: Progressing Goal: Cardiovascular complication will be avoided Outcome: Progressing   Problem: Activity: Goal: Risk for activity intolerance will decrease Outcome: Progressing   Problem: Nutrition: Goal: Adequate nutrition will be maintained Outcome: Progressing   Problem: Coping: Goal: Level of anxiety will decrease Outcome: Progressing   Problem: Elimination: Goal: Will not experience complications related to bowel motility Outcome: Progressing Goal: Will not experience complications related to urinary retention Outcome: Progressing   Problem: Pain Managment: Goal: General experience of comfort will improve Outcome: Progressing   Problem: Safety: Goal: Ability to remain free from injury will improve Outcome: Progressing   Problem: Skin Integrity: Goal: Risk for impaired skin integrity will decrease Outcome: Progressing   Problem: Education: Goal: Knowledge of [facility] Education information/materials will improve Outcome: Progressing Goal: Emotional status will improve Outcome: Progressing Goal: Mental status will improve Outcome: Progressing Goal: Verbalization of understanding the information provided will improve Outcome: Progressing   Problem: Activity: Goal: Interest or engagement in activities will  improve Outcome: Progressing Goal: Sleeping patterns will improve Outcome: Progressing   Problem: Coping: Goal: Ability to verbalize frustrations and anger appropriately will improve Outcome: Progressing Goal: Ability to demonstrate self-control will improve Outcome: Progressing   Problem: Health Behavior/Discharge Planning: Goal: Identification of resources available to assist in meeting health care needs will improve Outcome: Progressing Goal: Compliance with treatment plan for underlying cause of condition will improve Outcome: Progressing   Problem: Physical Regulation: Goal: Ability to maintain clinical measurements within normal limits will improve Outcome: Progressing   Problem: Safety: Goal: Periods of time without injury will increase Outcome: Progressing   

## 2023-08-05 NOTE — Group Note (Deleted)
Date:  08/05/2023 Time:  2:51 PM  Group Topic/Focus:  Self Care:   The focus of this group is to help patients understand the importance of self-care in order to improve or restore emotional, physical, spiritual, interpersonal, and financial health.     Participation Level:  {BHH PARTICIPATION ZOXWR:60454}  Participation Quality:  {BHH PARTICIPATION QUALITY:22265}  Affect:  {BHH AFFECT:22266}  Cognitive:  {BHH COGNITIVE:22267}  Insight: {BHH Insight2:20797}  Engagement in Group:  {BHH ENGAGEMENT IN UJWJX:91478}  Modes of Intervention:  {BHH MODES OF INTERVENTION:22269}  Additional Comments:  ***  Brett Sanford 08/05/2023, 2:51 PM

## 2023-08-05 NOTE — Progress Notes (Signed)
D- Patient alert and oriented x 4. Affect bright/mood pleasant. Denies SI/ HI/ AVH. Patient denies pain. Patient endorses depression and anxiety. Still noted tremors but says he is not having withdrawal symptoms except for mild anxiety. Complains of headache "comes and goes". His goals are to stay positive and active. A- Scheduled medications administered to patient, per MD orders. Support and encouragement provided.  Routine safety checks conducted every 15 minutes without incident.  Patient informed to notify staff with problems or concerns and verbalizes understanding. R- No adverse drug reactions noted.  Patient compliant with medications and treatment plan. Patient receptive, calm cooperative and interacts well with others on the unit.  Patient contracts for safety and  remains safe on the unit at this time.

## 2023-08-05 NOTE — Group Note (Signed)
Date:  08/05/2023 Time:  1:45 PM  Group Topic/Focus:  Activity Group:  The focus of the group is to promote activity for the patients to encourage exercise to go out in the courtyard and get some exercise.     Participation Level:  Active  Participation Quality:  Appropriate  Affect:  Appropriate  Cognitive:  Appropriate  Insight: Appropriate  Engagement in Group:  Engaged  Modes of Intervention:  Activity  Additional Comments:    Brett Sanford Brett Sanford 08/05/2023, 1:45 PM

## 2023-08-05 NOTE — Progress Notes (Signed)
   08/04/23 2300  Psych Admission Type (Psych Patients Only)  Admission Status Voluntary  Psychosocial Assessment  Patient Complaints Anxiety  Eye Contact Fair  Facial Expression Anxious  Affect Preoccupied;Anxious  Speech Soft;Rapid  Interaction Attention-seeking  Motor Activity Fidgety  Appearance/Hygiene Unremarkable  Behavior Characteristics Cooperative;Appropriate to situation  Mood Anxious;Pleasant  Aggressive Behavior  Effect No apparent injury  Thought Process  Coherency WDL  Content WDL  Delusions None reported or observed  Perception WDL  Hallucination None reported or observed  Judgment WDL  Confusion WDL  Danger to Self  Current suicidal ideation? Denies (Denies)  Agreement Not to Harm Self Yes  Description of Agreement Verbal   Remain stable without any complaints throughout the night.

## 2023-08-06 DIAGNOSIS — F332 Major depressive disorder, recurrent severe without psychotic features: Secondary | ICD-10-CM | POA: Diagnosis not present

## 2023-08-06 MED ORDER — HYDROXYZINE HCL 25 MG PO TABS: 25.0000 mg | ORAL_TABLET | Freq: Three times a day (TID) | ORAL | 0 refills | Status: AC | PRN

## 2023-08-06 MED ORDER — TRAZODONE HCL 50 MG PO TABS: 50.0000 mg | ORAL_TABLET | Freq: Every evening | ORAL | 0 refills | Status: DC | PRN

## 2023-08-06 MED ORDER — GABAPENTIN 300 MG PO CAPS: 300.0000 mg | ORAL_CAPSULE | Freq: Three times a day (TID) | ORAL | 0 refills | Status: DC

## 2023-08-06 NOTE — Group Note (Signed)
LCSW Group Therapy Note  Group Date: 08/06/2023 Start Time: 1410 End Time: 1455   Type of Therapy and Topic:  Group Therapy - Coping Skills  Participation Level:  Active   Description of Group The focus of this group was to determine what unhealthy coping techniques typically are used by group members and what healthy coping techniques would be helpful in coping with various problems. Patients were guided in becoming aware of the differences between healthy and unhealthy coping techniques. Patients were asked to identify 2-3 healthy coping skills they would like to learn to use more effectively.  Therapeutic Goals Patients learned that coping is what human beings do all day long to deal with various situations in their lives Patients defined and discussed healthy vs unhealthy coping techniques Patients identified their preferred coping techniques and identified whether these were healthy or unhealthy Patients determined 2-3 healthy coping skills they would like to become more familiar with and use more often. Patients provided support and ideas to each other   Summary of Patient Progress:  The patient attended group. Patient proved open to input from peers and feedback from [facility]. Patient demonstrated  insight into the subject matter, was respectful of peers, and participated throughout the entire session. The patient participated in the game of BINGO.      Marshell Levan, LCSWA 08/06/2023  3:04 PM

## 2023-08-06 NOTE — Discharge Summary (Signed)
Physician Discharge Summary Note  Patient:  Brett Sanford is an 32 y.o., male MRN:  [id number] DOB:  09-01-1991 Patient phone:  [phone] (home)  Patient address:   [address] [place] [facility] [address],  Total Time spent with patient: 45 minutes  Date of Admission:  08/01/2023 Date of Discharge: 08/06/2023  Reason for Admission:  depression and suicidal ideations  Principal Problem: Major depressive disorder, recurrent severe without psychotic features [facility]) Discharge Diagnoses: Principal Problem:   Major depressive disorder, recurrent severe without psychotic features (HCC)   Past Psychiatric History: alcohol use d/o, ADD, anxiety, depression  Past Medical History:  Past Medical History:  Diagnosis Date   ADD (attention deficit disorder)    Drug abuse (HCC)    Medical history non-contributory     Past Surgical History:  Procedure Laterality Date   NO PAST SURGERIES     Family History:  Family History  Family history unknown: Yes   Family Psychiatric  History: none Social History:  Social History   Substance and Sexual Activity  Alcohol Use Yes   Alcohol/week: 8.0 standard drinks of alcohol   Types: 8 Shots of liquor per week   Comment: a few shots/5-6 days/week     Social History   Substance and Sexual Activity  Drug Use Yes   Types: Marijuana, Methamphetamines   Comment: once a week    Social History   Socioeconomic History   Marital status: Single    Spouse name: Not on file   Number of children: Not on file   Years of education: Not on file   Highest education level: Not on file  Occupational History   Not on file  Tobacco Use   Smoking status: Every Day    Current packs/day: 1.50    Average packs/day: 1.5 packs/day for 5.0 years (7.5 ttl pk-yrs)    Types: Cigarettes   Smokeless tobacco: Never   Tobacco comments:    1-2 packs per day/5 years  Substance and Sexual Activity   Alcohol use: Yes    Alcohol/week: 8.0 standard drinks of  alcohol    Types: 8 Shots of liquor per week    Comment: a few shots/5-6 days/week   Drug use: Yes    Types: Marijuana, Methamphetamines    Comment: once a week   Sexual activity: Yes  Other Topics Concern   Not on file  Social History Narrative   Pt employed and living with mother who he identifies as his main social support. Pt has not taken his meds for the last 6 months. Pt has trouble getting medications because he does not have insurance.   Social Determinants of Health   Financial Resource Strain: Low Risk  (09/29/2022)   Received from [facility], [facility]   Overall Financial Resource Strain (CARDIA)    Difficulty of Paying Living Expenses: Not very hard  Food Insecurity: No Food Insecurity (08/01/2023)   Hunger Vital Sign    Worried About Running Out of Food in the Last Year: Never true    Ran Out of Food in the Last Year: Never true  Transportation Needs: No Transportation Needs (08/01/2023)   PRAPARE - Administrator, Civil Service (Medical): No    Lack of Transportation (Non-Medical): No  Physical Activity: Sufficiently Active (09/29/2022)   Received from [facility], [facility]   Exercise Vital Sign    Days of Exercise per Week: 3 days    Minutes of Exercise per  Session: 120 min  Stress: Stress Concern Present (09/29/2022)   Received from [facility], [facility]   [facility] of Occupational Health - Occupational Stress Questionnaire    Feeling of Stress : To some extent  Social Connections: Socially Isolated (09/29/2022)   Received from [facility], [facility]   Social Connection and Isolation Panel [NHANES]    Frequency of Communication with Friends and Family: More than three times a week    Frequency of Social Gatherings with Friends and Family: More than three times a week    Attends Religious Services: Never    Database administrator or Organizations: No    Attends Banker Meetings: Never     Marital Status: Never married    Hospital Course:   On admission, 32 y/o male w/ history of alcohol abuse, adhd, mdd, admitted to inpatient psychiatry as a transfer from [facility] with complaints of increased depression and SI.  He was also drinking excessively.  Medications started including the Ativan detox protocol along with therapy.  His depression resolved and suicidal ideations, anxiety improved to a mild level, no withdrawal symptoms or cravings.  Denies homicidal ideations, hallucinations, paranoia.  Payam has met maximum benefit of hospitalization.  Discharge instructions provided with explanations along with Rx, crisis numbers, and follow up appointment information.   Physical Findings: AIMS:  , ,  ,  ,    CIWA:  CIWA-Ar Total: 0 COWS:     Musculoskeletal: Strength & Muscle Tone: within normal limits Gait & Station: normal Patient leans: N/A Psychiatric Specialty Exam: Physical Exam Vitals and nursing note reviewed.  Constitutional:      Appearance: Normal appearance.  HENT:     Head: Normocephalic.     Nose: Nose normal.  Pulmonary:     Effort: Pulmonary effort is normal.  Musculoskeletal:        General: Normal range of motion.     Cervical back: Normal range of motion.  Neurological:     General: No focal deficit present.     Mental Status: He is alert and oriented to person, place, and time.       Review of Systems  Psychiatric/Behavioral:  Positive for substance abuse. The patient is nervous/anxious.   All other systems reviewed and are negative.    Blood pressure 134/88, pulse 78, temperature 98 F (36.7 C), resp. rate (!) 22, height 5\' 8"  (1.727 m), weight 93.4 kg, SpO2 99%.Body mass index is 31.32 kg/m.  General Appearance: Casual  Eye Contact:  Good  Speech:  Normal Rate  Volume:  Normal  Mood:  Anxious, mild  Affect:  Congruent  Thought Process:  Coherent  Orientation:  Full (Time, Place, and Person)  Thought Content:  WDL and Logical  Suicidal  Thoughts:  No  Homicidal Thoughts:  No  Memory:  Immediate;   Good Recent;   Good Remote;   Good  Judgement:  Fair  Insight:  Fair  Psychomotor Activity:  Normal  Concentration:  Concentration: Good and Attention Span: Good  Recall:  Good  Fund of Knowledge:  Good  Language:  Good  Akathisia:  No  Handed:  Right  AIMS (if indicated):     Assets:  Housing Leisure Time Physical Health Resilience Social Support Vocational/Educational  ADL's:  Intact  Cognition:  WNL  Sleep:        Physical Exam: Physical Exam Vitals and nursing note reviewed.  Constitutional:      Appearance: Normal  appearance.  HENT:     Head: Normocephalic.     Nose: Nose normal.  Pulmonary:     Effort: Pulmonary effort is normal.  Musculoskeletal:        General: Normal range of motion.     Cervical back: Normal range of motion.  Neurological:     General: No focal deficit present.     Mental Status: He is alert and oriented to person, place, and time.    Review of Systems  Psychiatric/Behavioral:  Positive for substance abuse. The patient is nervous/anxious.   All other systems reviewed and are negative.  Blood pressure 134/88, pulse 78, temperature 98 F (36.7 C), resp. rate (!) 22, height 5\' 8"  (1.727 m), weight 93.4 kg, SpO2 99%. Body mass index is 31.32 kg/m.   Social History   Tobacco Use  Smoking Status Every Day   Current packs/day: 1.50   Average packs/day: 1.5 packs/day for 5.0 years (7.5 ttl pk-yrs)   Types: Cigarettes  Smokeless Tobacco Never  Tobacco Comments   1-2 packs per day/5 years   Tobacco Cessation:  A prescription for an FDA-approved tobacco cessation medication was offered at discharge and the patient refused   Blood Alcohol level:  Lab Results  Component Value Date   ETH 147 (H) 07/31/2023   ETH <10 04/15/2020    Metabolic Disorder Labs:  Lab Results  Component Value Date   HGBA1C 4.9 07/31/2023   MPG 93.93 07/31/2023   MPG 96.8 04/15/2020   No  results found for: "PROLACTIN" Lab Results  Component Value Date   CHOL 317 (H) 07/31/2023   TRIG 241 (H) 07/31/2023   HDL 110 07/31/2023   CHOLHDL 2.9 07/31/2023   VLDL 48 (H) 07/31/2023   LDLCALC 159 (H) 07/31/2023   LDLCALC NOT CALCULATED 04/15/2020    See Psychiatric Specialty Exam and Suicide Risk Assessment completed by Attending Physician prior to discharge.  Discharge destination:  Home  Is patient on multiple antipsychotic therapies at discharge:  No   Has Patient had three or more failed trials of antipsychotic monotherapy by history:  No  Recommended Plan for Multiple Antipsychotic Therapies: NA  Discharge Instructions     Diet - low sodium heart healthy   Complete by: As directed    Discharge instructions   Complete by: As directed    Follow up with outpatient provider appointment   Increase activity slowly   Complete by: As directed       Allergies as of 08/06/2023       Reactions   Benadryl [diphenhydramine Hcl] Other (See Comments)   Caused hyperactivity   Metoclopramide Other (See Comments)   Jittery        Medication List     TAKE these medications      Indication  gabapentin 300 MG capsule Commonly known as: NEURONTIN Take 1 capsule (300 mg total) by mouth 3 (three) times daily.  Indication: Abuse or Misuse of Alcohol   hydrOXYzine 25 MG tablet Commonly known as: ATARAX Take 1 tablet (25 mg total) by mouth 3 (three) times daily as needed for anxiety.  Indication: Feeling Anxious   ondansetron 4 MG disintegrating tablet Commonly known as: ZOFRAN-ODT Take 4 mg by mouth every 8 (eight) hours as needed.  Indication: Nausea and Vomiting   PriLOSEC OTC 20 MG tablet Generic drug: omeprazole Take 20 mg by mouth daily.  Indication: Heartburn   traZODone 50 MG tablet Commonly known as: DESYREL Take 1 tablet (50 mg total) by mouth  at bedtime as needed for sleep. What changed: how much to take  Indication: Trouble Sleeping         Follow-up Information     [facility] Follow up.   Why: Referral has been sent to [facility].  No response yet on appointment,  please call to follow up with [facility]. Contact information: [address]  [address] [place] [address] [phone]         [facility] Follow up.   Why: Walk in hours are Monday through Friday 8AM to 4PM, first come first serve.  Please arrive early Contact information: [address] [place] [address] [phone]                 Follow-up recommendations:  Activity:  as tolerated Diet:  heart healthy diet Major depressive disorder, recurrent, severe without psychosis: He would rather focus on therapy   Alcohol use d/o: Gabapentin 300mg  TID   Anxiety: Hydroxyzine 25 mg TID PRN   Insomnia: Trazodone 50 mg daily at bedtime PRN  Comments:  follow up with appointments listed above  Signed: Nanine Means, NP 08/06/2023, 9:28 AM

## 2023-08-06 NOTE — Group Note (Signed)
Date:  08/06/2023 Time:  5:07 PM  Group Topic/Focus:  STRUCTURED GROUP ACTIVITY  The focus of the group is to promote activity for the patients to encourage exercise to go out in the courtyard and get some exercise.     Participation Level:  Active  Participation Quality:  Appropriate  Affect:  Appropriate  Cognitive:  Alert, Appropriate, and Oriented  Insight: Appropriate  Engagement in Group:  Developing/Improving and Engaged  Modes of Intervention:  Activity  Additional Comments:    Dai Mcadams 08/06/2023, 5:07 PM

## 2023-08-06 NOTE — Plan of Care (Signed)
  Problem: Education: Goal: Knowledge of General Education information will improve Description: Including pain rating scale, medication(s)/side effects and non-pharmacologic comfort measures Outcome: Adequate for Discharge   Problem: Health Behavior/Discharge Planning: Goal: Ability to manage health-related needs will improve Outcome: Adequate for Discharge   Problem: Clinical Measurements: Goal: Ability to maintain clinical measurements within normal limits will improve Outcome: Adequate for Discharge Goal: Will remain free from infection Outcome: Adequate for Discharge Goal: Diagnostic test results will improve Outcome: Adequate for Discharge Goal: Respiratory complications will improve Outcome: Adequate for Discharge Goal: Cardiovascular complication will be avoided Outcome: Adequate for Discharge   Problem: Activity: Goal: Risk for activity intolerance will decrease Outcome: Adequate for Discharge   Problem: Nutrition: Goal: Adequate nutrition will be maintained Outcome: Adequate for Discharge   Problem: Coping: Goal: Level of anxiety will decrease Outcome: Adequate for Discharge   Problem: Elimination: Goal: Will not experience complications related to bowel motility Outcome: Adequate for Discharge Goal: Will not experience complications related to urinary retention Outcome: Adequate for Discharge   Problem: Pain Managment: Goal: General experience of comfort will improve Outcome: Adequate for Discharge   Problem: Safety: Goal: Ability to remain free from injury will improve Outcome: Adequate for Discharge   Problem: Skin Integrity: Goal: Risk for impaired skin integrity will decrease Outcome: Adequate for Discharge   Problem: Education: Goal: Knowledge of [place] General Education information/materials will improve Outcome: Adequate for Discharge Goal: Emotional status will improve Outcome: Adequate for Discharge Goal: Mental status will  improve Outcome: Adequate for Discharge Goal: Verbalization of understanding the information provided will improve Outcome: Adequate for Discharge   Problem: Activity: Goal: Interest or engagement in activities will improve Outcome: Adequate for Discharge Goal: Sleeping patterns will improve Outcome: Adequate for Discharge   Problem: Coping: Goal: Ability to verbalize frustrations and anger appropriately will improve Outcome: Adequate for Discharge Goal: Ability to demonstrate self-control will improve Outcome: Adequate for Discharge   Problem: Health Behavior/Discharge Planning: Goal: Identification of resources available to assist in meeting health care needs will improve Outcome: Adequate for Discharge Goal: Compliance with treatment plan for underlying cause of condition will improve Outcome: Adequate for Discharge   Problem: Physical Regulation: Goal: Ability to maintain clinical measurements within normal limits will improve Outcome: Adequate for Discharge   Problem: Safety: Goal: Periods of time without injury will increase Outcome: Adequate for Discharge   

## 2023-08-06 NOTE — Plan of Care (Signed)
  Problem: Coping: Goal: Level of anxiety will decrease Outcome: Progressing   Problem: Pain Managment: Goal: General experience of comfort will improve Outcome: Progressing   

## 2023-08-06 NOTE — Progress Notes (Signed)
Discharge Note:  Patient denies SI/HI/AVH at this time. Discharge instructions, AVS, prescriptions, and transition recor gone over with patient. Patient agrees to comply with medication management, follow-up visit, and outpatient therapy. Patient belongings returned to patient. Patient questions and concerns addressed and answered. Patient ambulatory off unit. Patient discharged to home with parents.   

## 2023-08-06 NOTE — Group Note (Signed)
Date:  08/06/2023 Time:  10:26 AM  Group Topic/Focus:  Goals Group:   The focus of this group is to help patients establish daily goals to achieve during treatment and discuss how the patient can incorporate goal setting into their daily lives to aide in recovery. Healthy Communication:   The focus of this group is to discuss communication, barriers to communication, as well as healthy ways to communicate with others. Identifying Needs:   The focus of this group is to help patients identify their personal needs that have been historically problematic and identify healthy behaviors to address their needs.    Participation Level:  Active  Participation Quality:  Appropriate and Attentive  Affect:  Appropriate  Cognitive:  Alert, Appropriate, and Oriented  Insight: Appropriate  Engagement in Group:  Developing/Improving  Modes of Intervention:  Activity, Discussion, Education, and Socialization  Additional Comments:    Rosaura Carpenter 08/06/2023, 10:26 AM

## 2023-08-06 NOTE — BHH Suicide Risk Assessment (Signed)
[facility] Discharge Suicide Risk Assessment   Principal Problem: Major depressive disorder, recurrent severe without psychotic features [facility]) Discharge Diagnoses: Principal Problem:   Major depressive disorder, recurrent severe without psychotic features (HCC)   Total Time spent with patient: 45 minutes  Musculoskeletal: Strength & Muscle Tone: within normal limits Gait & Station: normal Patient leans: N/A Psychiatric Specialty Exam: Physical Exam Vitals and nursing note reviewed.  Constitutional:      Appearance: Normal appearance.  HENT:     Head: Normocephalic.     Nose: Nose normal.  Pulmonary:     Effort: Pulmonary effort is normal.  Musculoskeletal:        General: Normal range of motion.     Cervical back: Normal range of motion.  Neurological:     General: No focal deficit present.     Mental Status: He is alert and oriented to person, place, and time.     Review of Systems  Psychiatric/Behavioral:  Positive for substance abuse. The patient is nervous/anxious.   All other systems reviewed and are negative.   Blood pressure 134/88, pulse 78, temperature 98 F (36.7 C), resp. rate (!) 22, height 5\' 8"  (1.727 m), weight 93.4 kg, SpO2 99%.Body mass index is 31.32 kg/m.  General Appearance: Casual  Eye Contact:  Good  Speech:  Normal Rate  Volume:  Normal  Mood:  Anxious, mild  Affect:  Congruent  Thought Process:  Coherent  Orientation:  Full (Time, Place, and Person)  Thought Content:  WDL and Logical  Suicidal Thoughts:  No  Homicidal Thoughts:  No  Memory:  Immediate;   Good Recent;   Good Remote;   Good  Judgement:  Fair  Insight:  Fair  Psychomotor Activity:  Normal  Concentration:  Concentration: Good and Attention Span: Good  Recall:  Good  Fund of Knowledge:  Good  Language:  Good  Akathisia:  No  Handed:  Right  AIMS (if indicated):     Assets:  Housing Leisure Time Physical Health Resilience Social Support Vocational/Educational  ADL's:   Intact  Cognition:  WNL  Sleep:        Physical Exam: Physical Exam Vitals and nursing note reviewed.  Constitutional:      Appearance: Normal appearance.  HENT:     Head: Normocephalic.     Nose: Nose normal.  Pulmonary:     Effort: Pulmonary effort is normal.  Musculoskeletal:        General: Normal range of motion.     Cervical back: Normal range of motion.  Neurological:     General: No focal deficit present.     Mental Status: He is alert and oriented to person, place, and time.    Review of Systems  Psychiatric/Behavioral:  Positive for substance abuse. The patient is nervous/anxious.   All other systems reviewed and are negative.  Blood pressure 134/88, pulse 78, temperature 98 F (36.7 C), resp. rate (!) 22, height 5\' 8"  (1.727 m), weight 93.4 kg, SpO2 99%. Body mass index is 31.32 kg/m.  Mental Status Per Nursing Assessment::   On Admission:  Suicidal ideation indicated by patient  Demographic Factors:  Male and Caucasian  Loss Factors: NA  Historical Factors: NA  Risk Reduction Factors:   Sense of responsibility to family, Employed, Living with another person, especially a relative, and Positive social support  Continued Clinical Symptoms:  Anxiety, mild  Cognitive Features That Contribute To Risk:  None    Suicide Risk:  Minimal: No identifiable suicidal ideation.  Patients presenting with no risk factors but with morbid ruminations; may be classified as minimal risk based on the severity of the depressive symptoms   Follow-up Information     [facility] Follow up.   Why: Referral has been sent to [facility].  No response yet on appointment,  please call to follow up with [facility]. Contact information: [address]  [address] [place] [address] [phone]         [facility] Follow up.   Why: Walk in hours are Monday through Friday 8AM to 4PM, first come first serve.  Please arrive early Contact information: [address] [place] [address] [phone]                 Plan Of Care/Follow-up recommendations:  Activity:  as tolerated Diet:  heart healthy diet Major depressive disorder, recurrent, severe without psychosis: Follow up with [facility] and [facility]   Alcohol use d/o: Ativan detox protocol completed  Gabapentin 300mg  TID   Anxiety: Hydroxyzine 25 mg TID PRN   Insomnia: Trazodone 50 mg daily at bedtime PRN  Nanine Means, NP 08/06/2023, 9:23 AM

## 2023-08-06 NOTE — Progress Notes (Signed)
  [facility] Adult Case Management Discharge Plan :  Will you be returning to the same living situation after discharge:  Yes,  [address] [place]  At discharge, do you have transportation home?: Yes,  The patient mom will pick him up. Do you have the ability to pay for your medications: Yes,  The patient stated his mom can pay, but may need assistance.  Release of information consent forms completed and in the chart;  Patient's signature needed at discharge.  Patient to Follow up at:  Follow-up Information     [facility] Follow up.   Why: Referral has been sent to [facility].  No response yet on appointment,  please call to follow up with [facility] Contact information: [address]  [address] [place] [address] [phone]         Inc, [facility] Follow up.   Why: Walk in hours are Monday through Friday 8AM to 4PM, first come first serve.  Please arrive early Contact information: [address] [place] [address] [phone]                 Next level of care provider has access to [facility] Link:yes  Safety Planning and Suicide Prevention discussed: Yes,  Completed with patient and SPI pamphlet was provided.     Has patient been referred to the Quitline?: Patient does not use tobacco/nicotine products  Patient has been referred for addiction treatment: Yes, referral information given but appointment not made [facility], [facility] (list facility).  Marshell Levan, LCSW 08/06/2023, 11:45 AM

## 2023-10-11 ENCOUNTER — Other Ambulatory Visit: Payer: Self-pay

## 2023-10-11 ENCOUNTER — Ambulatory Visit
Admission: EM | Admit: 2023-10-11 | Discharge: 2023-10-11 | Disposition: A | Discharge status: Other Acute Inpatient Hospital | Payer: Medicaid Other | Attending: Physician Assistant | Admitting: Physician Assistant

## 2023-10-11 ENCOUNTER — Emergency Department (HOSPITAL_COMMUNITY)
Admission: EM | Admit: 2023-10-11 | Discharge: 2023-10-11 | Discharge status: Against Medical Advice | Payer: Self-pay | Attending: Emergency Medicine | Admitting: Emergency Medicine

## 2023-10-11 ENCOUNTER — Emergency Department (HOSPITAL_COMMUNITY): Payer: Self-pay

## 2023-10-11 ENCOUNTER — Encounter (HOSPITAL_COMMUNITY): Payer: Self-pay

## 2023-10-11 ENCOUNTER — Ambulatory Visit (INDEPENDENT_AMBULATORY_CARE_PROVIDER_SITE_OTHER): Payer: Self-pay

## 2023-10-11 ENCOUNTER — Encounter: Payer: Self-pay | Admitting: Emergency Medicine

## 2023-10-11 DIAGNOSIS — Z5321 Procedure and treatment not carried out due to patient leaving prior to being seen by health care provider: Secondary | ICD-10-CM | POA: Insufficient documentation

## 2023-10-11 DIAGNOSIS — R051 Acute cough: Secondary | ICD-10-CM | POA: Insufficient documentation

## 2023-10-11 DIAGNOSIS — R0600 Dyspnea, unspecified: Secondary | ICD-10-CM | POA: Insufficient documentation

## 2023-10-11 DIAGNOSIS — R079 Chest pain, unspecified: Secondary | ICD-10-CM | POA: Insufficient documentation

## 2023-10-11 LAB — CBC
HCT: 48.3 % (ref 39.0–52.0)
Hemoglobin: 16.8 g/dL (ref 13.0–17.0)
MCH: 33.4 pg (ref 26.0–34.0)
MCHC: 34.8 g/dL (ref 30.0–36.0)
MCV: 96 fL (ref 80.0–100.0)
Platelets: 313 10*3/uL (ref 150–400)
RBC: 5.03 MIL/uL (ref 4.22–5.81)
RDW: 12.3 % (ref 11.5–15.5)
WBC: 6.9 10*3/uL (ref 4.0–10.5)
nRBC: 0 % (ref 0.0–0.2)

## 2023-10-11 LAB — BASIC METABOLIC PANEL WITH GFR
Anion gap: 13 (ref 5–15)
BUN: 8 mg/dL (ref 6–20)
CO2: 20 mmol/L — ABNORMAL LOW (ref 22–32)
Calcium: 9.1 mg/dL (ref 8.9–10.3)
Chloride: 104 mmol/L (ref 98–111)
Creatinine, Ser: 0.81 mg/dL (ref 0.61–1.24)
GFR, Estimated: >60 mL/min
Glucose, Bld: 100 mg/dL — ABNORMAL HIGH (ref 70–99)
Potassium: 3.4 mmol/L — ABNORMAL LOW (ref 3.5–5.1)
Sodium: 137 mmol/L (ref 135–145)

## 2023-10-11 LAB — TROPONIN I (HIGH SENSITIVITY): Troponin I (High Sensitivity): 2 ng/L

## 2023-10-11 NOTE — ED Notes (Signed)
 Patient is being discharged from the Urgent Care and sent to the Emergency Department via POV . Per RM, patient is in need of higher level of care due to CP. Patient is aware and verbalizes understanding of plan of care.  Vitals:   10/11/23 0922  BP: 133/76  Pulse: 98  Resp: 18  Temp: 98.5 F (36.9 C)  SpO2: 97%

## 2023-10-11 NOTE — ED Triage Notes (Signed)
 Pt reports with chest pain since the day after Christmas. Pt states that it hurts worse when he breathes in and out.

## 2023-10-11 NOTE — ED Triage Notes (Signed)
 Pt here with cough and pain with cough and breathing x 1 week; pt sts became worse this am

## 2023-10-15 NOTE — ED Provider Notes (Signed)
 Patient here today for evaluation of cough with associated pain with coughing breathing that started a week ago.  He reports pain is significantly worse in the morning.  Patient appears uncomfortable.  Recommended further evaluation emergency room given degree of discomfort.  He is agreeable to same and will have someone transport him to the ED via POV.   Billy Asberry FALCON, PA-C 10/15/23 1224

## 2024-02-25 ENCOUNTER — Ambulatory Visit
Admission: EM | Admit: 2024-02-25 | Discharge: 2024-02-25 | Disposition: A | Discharge status: Home / Self Care | Payer: Self-pay | Attending: Emergency Medicine | Admitting: Emergency Medicine

## 2024-02-25 DIAGNOSIS — J101 Influenza due to other identified influenza virus with other respiratory manifestations: Secondary | ICD-10-CM

## 2024-02-25 DIAGNOSIS — R112 Nausea with vomiting, unspecified: Secondary | ICD-10-CM

## 2024-02-25 LAB — POCT INFLUENZA A/B
Influenza A, POC: POSITIVE — AB
Influenza B, POC: NEGATIVE

## 2024-02-25 MED ORDER — ONDANSETRON 4 MG PO TBDP
4.0000 mg | ORAL_TABLET | Freq: Once | ORAL | Status: AC
  Administered 2024-02-25: 4 mg via ORAL

## 2024-02-25 MED ORDER — ONDANSETRON HCL 4 MG/2ML IJ SOLN
4.0000 mg | Freq: Once | INTRAMUSCULAR | Status: AC
  Administered 2024-02-25: 4 mg via INTRAMUSCULAR

## 2024-02-25 MED ORDER — PROMETHAZINE-DM 6.25-15 MG/5ML PO SYRP: 5.0000 mL | ORAL_SOLUTION | Freq: Four times a day (QID) | ORAL | 0 refills | Status: DC | PRN

## 2024-02-25 MED ORDER — ONDANSETRON 4 MG PO TBDP: 4.0000 mg | ORAL_TABLET | Freq: Four times a day (QID) | ORAL | 0 refills | Status: DC | PRN

## 2024-02-25 MED ORDER — ACETAMINOPHEN 325 MG PO TABS
975.0000 mg | ORAL_TABLET | Freq: Once | ORAL | Status: AC
  Administered 2024-02-25: 975 mg via ORAL

## 2024-02-25 NOTE — ED Triage Notes (Signed)
 Patient presents to UC for HA, cough, n/v, runny nose since yesterday. Treating with ibuprofen .

## 2024-02-25 NOTE — Discharge Instructions (Addendum)
 The zofran  can be used every 6 hours as needed to settle the stomach.  The promethazine DM cough syrup can be used up to 4 times daily. If this medication makes you drowsy, take only once before bed. This may also help with nausea.  Drink LOTS of water and fluids. Dehydration will make you feel much more sick, so keep hydrated.  Use only Tylenol  for headache. If you have appetite, try bland diet like saltine crackers. With food in the belly you can also try ibuprofen  if needed - please do not take on empty stomach.   Please go to the emergency department if symptoms worsen or become severe, especially if vomiting returns and you cannot keep fluids down

## 2024-02-25 NOTE — ED Provider Notes (Signed)
 [facility]    CSN: [id number] Arrival date & time: 02/25/24  1040     History   Chief Complaint Chief Complaint  Patient presents with   Nasal Congestion    HPI TYMOTHY CASS is a 33 y.o. male.  Here with headache, cough, nausea with vomiting, and runny nose that started yesterday. Several episodes of emesis today including 2-3 times while in clinic. Not having abdominal pain or diarrhea No known fever  Took ibuprofen  dose on empty stomach about 4 hours ago. History of reflux and gastritis   Recently returned from trip to [place]. Flew on plane. Sick contacts. Reports having a negative covid test today. He would like a flu test  History of hypokalemia, alcohol abuse, acute pancreatitis   Past Medical History:  Diagnosis Date   ADD (attention deficit disorder)    Drug abuse 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 Authorizing Provider  ondansetron  (ZOFRAN -ODT) 4 MG disintegrating tablet Take 1 tablet (4 mg total) by mouth every 6 (six) hours as needed for nausea or vomiting. 02/25/24  Yes Leah Skora, Ivette Marks, PA-C  promethazine-dextromethorphan (PROMETHAZINE-DM) 6.25-15 MG/5ML syrup Take 5 mLs by mouth 4 (four) times daily as needed for  cough. 02/25/24  Yes Shatona Andujar, Ivette Marks, PA-C  gabapentin  (NEURONTIN ) 300 MG capsule Take 1 capsule (300 mg total) by mouth 3 (three) times daily. 08/06/23 09/05/23  Lissa Riding, NP  omeprazole (PRILOSEC OTC) 20 MG tablet Take 20 mg by mouth daily.    [provider]  traZODone  (DESYREL ) 50 MG tablet Take 1 tablet (50 mg total) by mouth at bedtime as needed for sleep. 08/06/23 09/05/23  Lissa Riding, NP    Family History Family History  Family history unknown: Yes    Social History Social History   Tobacco Use   Smoking status: Every Day    Current packs/day: 1.50    Average packs/day: 1.5 packs/day for 5.0 years (7.5 ttl pk-yrs)    Types: Cigarettes   Smokeless tobacco: Never   Tobacco comments:    1-2 packs per day/5 years  Substance Use Topics   Alcohol use: Yes    Alcohol/week: 8.0 standard drinks of alcohol    Types: 8 Shots of liquor per week    Comment: a few shots/5-6 days/week   Drug use: Yes    Types: Marijuana, Methamphetamines    Comment: once a week     Allergies   Benadryl [diphenhydramine hcl] and Metoclopramide    Review of Systems Review of Systems As per HPI  Physical Exam Triage Vital Signs ED Triage Vitals  Encounter Vitals Group     BP 02/25/24 1056 116/78     Systolic BP Percentile --      Diastolic BP Percentile --  Pulse Rate 02/25/24 1056 (!) 118     Resp 02/25/24 1056 16     Temp 02/25/24 1056 97.9 F (36.6 C)     Temp Source 02/25/24 1056 Oral     SpO2 02/25/24 1056 95 %     Weight --      Height --      Head Circumference --      Peak Flow --      Pain Score 02/25/24 1057 5     Pain Loc --      Pain Education --      Exclude from Growth Chart --    No data found.  Updated Vital Signs BP 116/78 (BP Location: Right Arm)   Pulse 97   Temp 98.1 F (36.7 C)   Resp 16   SpO2 98%   Physical Exam Vitals and nursing note reviewed.  Constitutional:      General: He is not in acute distress.    Appearance:  Normal appearance. He is not ill-appearing or diaphoretic.  HENT:     Right Ear: Tympanic membrane and ear canal normal.     Left Ear: Tympanic membrane and ear canal normal.     Nose: No congestion or rhinorrhea.     Mouth/Throat:     Mouth: Mucous membranes are moist.     Pharynx: Oropharynx is clear. No posterior oropharyngeal erythema.  Eyes:     Conjunctiva/sclera: Conjunctivae normal.  Cardiovascular:     Rate and Rhythm: Normal rate and regular rhythm.     Pulses: Normal pulses.     Heart sounds: Normal heart sounds.  Pulmonary:     Effort: Pulmonary effort is normal. No respiratory distress.     Breath sounds: Normal breath sounds. No wheezing.  Abdominal:     General: There is no distension.     Palpations: Abdomen is soft.     Tenderness: There is no abdominal tenderness. There is no right CVA tenderness, left CVA tenderness, guarding or rebound.  Musculoskeletal:        General: Normal range of motion.     Cervical back: Normal range of motion. No rigidity or tenderness.  Lymphadenopathy:     Cervical: No cervical adenopathy.  Skin:    General: Skin is warm and dry.  Neurological:     Mental Status: He is alert and oriented to person, place, and time.     Motor: No weakness.     UC Treatments / Results  Labs (all labs ordered are listed, but only abnormal results are displayed) Labs Reviewed  POCT INFLUENZA A/B - Abnormal; Notable for the following components:      Result Value   Influenza A, POC Positive (*)    All other components within normal limits    EKG  Radiology No results found.  Procedures Procedures  Medications Ordered in UC Medications  ondansetron  (ZOFRAN -ODT) disintegrating tablet 4 mg (4 mg Oral Given 02/25/24 1114)  ondansetron  (ZOFRAN ) injection 4 mg (4 mg Intramuscular Given 02/25/24 1137)  ondansetron  (ZOFRAN ) injection 4 mg (4 mg Intramuscular Given 02/25/24 1206)  acetaminophen  (TYLENOL ) tablet 975 mg (975 mg Oral Given 02/25/24  1224)    Initial Impression / Assessment and Plan / UC Course  I have reviewed the triage vital signs and the nursing notes.  Pertinent labs & imaging results that were available during my care of the patient were reviewed by me and considered in my medical decision making (see chart for details).  Afebrile but  tachycardic 118, on recheck HR is normalized   Rapid flu A is positive  Zofran  ODT given in clinic with emesis continued IM 4 mg dose is given. About 20 minutes later patient with active emesis. Unfortunately patient has allergy to Reglan , we will not give at this time. A second IM zofran  dose is given.  On recheck after 20 minutes, patient reports nausea is resolved. He would like to try a tylenol  dose for headache, and PO challenge with water. Water is tolerated and he is feeling much improved.  Vitals are consistently stable. He is able to drink another cup of water before discharge, no further emesis.   Promethazine DM for cough and nausea Zofran  ODT q6 hours if needed Tylenol  for fever/aches. Avoid ibuprofen  and nsaids unless able to eat something first Strict ED precautions are discussed, patient verbalizes understanding. Note for work is provided  Final Clinical Impressions(s) / UC Diagnoses   Final diagnoses:  Influenza A  Nausea and vomiting, unspecified vomiting type     Discharge Instructions      The zofran  can be used every 6 hours as needed to settle the stomach.  The promethazine DM cough syrup can be used up to 4 times daily. If this medication makes you drowsy, take only once before bed. This may also help with nausea.  Drink LOTS of water and fluids. Dehydration will make you feel much more sick, so keep hydrated.  Use only Tylenol  for headache. If you have appetite, try bland diet like saltine crackers. With food in the belly you can also try ibuprofen  if needed - please do not take on empty stomach.   Please go to the emergency department if  symptoms worsen or become severe, especially if vomiting returns and you cannot keep fluids down   ED Prescriptions     Medication Sig Dispense Auth. Provider   promethazine-dextromethorphan (PROMETHAZINE-DM) 6.25-15 MG/5ML syrup Take 5 mLs by mouth 4 (four) times daily as needed for cough. 240 mL Epsie Walthall, PA-C   ondansetron  (ZOFRAN -ODT) 4 MG disintegrating tablet Take 1 tablet (4 mg total) by mouth every 6 (six) hours as needed for nausea or vomiting. 20 tablet Dontrae Morini, Ivette Marks, PA-C      PDMP not reviewed this encounter.   Christyanna Mckeon, Ivette Marks, PA-C 02/25/24 1246

## 2024-02-25 NOTE — ED Triage Notes (Signed)
 Req FLU test. Negative covid test today.

## 2024-02-28 ENCOUNTER — Other Ambulatory Visit: Payer: Self-pay

## 2024-02-28 ENCOUNTER — Ambulatory Visit
Admission: EM | Admit: 2024-02-28 | Discharge: 2024-02-28 | Disposition: A | Discharge status: Home / Self Care | Payer: Self-pay | Attending: Physician Assistant | Admitting: Physician Assistant

## 2024-02-28 DIAGNOSIS — J101 Influenza due to other identified influenza virus with other respiratory manifestations: Secondary | ICD-10-CM

## 2024-02-28 DIAGNOSIS — A09 Infectious gastroenteritis and colitis, unspecified: Secondary | ICD-10-CM

## 2024-02-28 MED ORDER — ONDANSETRON 4 MG PO TBDP: 4.0000 mg | ORAL_TABLET | Freq: Three times a day (TID) | ORAL | 0 refills | Status: DC | PRN

## 2024-02-28 NOTE — ED Triage Notes (Signed)
 Pt presents to urgent care needing an extended work note. Pt explained that he was seen here on 5/18, symptoms are improving however diarrhea started yesterday. Feeling overall fatigued. Pt currently denies pain, voices discomfort in lower abdomen. Able to keep clear liquids down, on a bland diet at this time.

## 2024-02-28 NOTE — Discharge Instructions (Addendum)
 Today we discussed your concerns for ongoing diarrhea Diarrhea is usually self-limited and can have many causes- yours is most likely a symptom of Influenza A  The central tenants of management include the following: Staying well hydrated - this includes increasing water intake and supplementing with Pedialyte, Gatorade, etc. Try to eat a bland diet- this includes boiled chicken, brown rice, bananas, applesauce, plain toast, etc. As you are feeling better you can usually begin to slowly reincorporate your normal dietary choices back into your dietary consumption Using Imodium  and Pepto Bismol as needed and according to manufacturers instructions to provide assistance with managing symptoms.  I have sent in a refill of your Zofran  to assist with any nausea, vomiting.   Diarrhea with the following will need to be monitored and may require further testing: bloody diarrhea, fever, weight loss, signs of dehydration, weakness, fatigue, incontinence, diarrhea lasting longer than 1-3 weeks   Based on your described symptoms and the duration of symptoms it is likely that you have a viral upper respiratory infection (often called a "cold")  Symptoms can last for 3-10 days with lingering cough and intermittent symptoms potentially  lasting several  weeks after that.  The goal of treatment at this time is to reduce your symptoms and discomfort   You can use the following medications and measures to help yourself feel better until your body fights this off: DayQuil/NyQuil, TheraFlu, Alka-Seltzer  (these medications typically have the same active ingredients in them so you can choose whichever one you prefer and take consistently during the day and night according to the manufactures instructions.) Flonase A daily antihistamine such as Zyrtec, Claritin, Allegra per your preference.  Please choose 1 and take consistently. Increased fluids.  It is recommended that you take in at least 64 ounces of water per day  when you are not sick so it is important to increase this when you are sick and your body may be running fever. Rest Cough drops Chloraseptic throat spray to help with sore throat Nasal saline spray or nasal flushes to help with congestion and runny nose  If your symptoms seem like they are getting worse over the next 5 to 7 days or not improving you can always follow-up here in urgent care or go to your primary care provider for further management. Go to the ER if you begin to have more serious symptoms such as shortness of breath, trouble breathing, loss of consciousness, swelling around the eyes, high fever, severe lasting headaches, vision changes or neck pain/stiffness.

## 2024-02-28 NOTE — ED Provider Notes (Signed)
 [facility]    CSN: [id number] Arrival date & time: 02/28/24  1357      History   Chief Complaint Chief Complaint  Patient presents with   Letter for School/Work   Diarrhea    HPI Brett Sanford is a 33 y.o. male.   HPI  Patient reports he was seen on Sunday and diagnosed with Flu  He reports that he has since developed diarrhea and is still not feeling well He has taken an antidiarrheal medication and reports that he has not had any more diarrhea today He has been able to tolerate PO intake without nausea or vomiting He does admit to cough, runny nose, fatigue He reports fever on Sun but this has not been present since Monday  He reports that he is feeling somewhat better but does not feel like he is back to normal    Past Medical History:  Diagnosis Date   ADD (attention deficit disorder)    Drug abuse [facility])    Medical history non-contributory     Patient Active Problem List   Diagnosis Date Noted   Major depressive disorder, 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 Authorizing Provider  ondansetron  (ZOFRAN -ODT) 4 MG disintegrating tablet Take 1 tablet (4 mg total) by mouth every 6 (six) hours as needed for nausea or vomiting. 02/25/24  Yes Rising, Ivette Marks, PA-C  ondansetron  (ZOFRAN -ODT) 4 MG disintegrating tablet Take 1 tablet (4 mg total) by mouth  every 8 (eight) hours as needed for nausea or vomiting. 02/28/24  Yes Rudell Marlowe E, PA-C  gabapentin  (NEURONTIN ) 300 MG capsule Take 1 capsule (300 mg total) by mouth 3 (three) times daily. 08/06/23 09/05/23  Lissa Riding, NP  omeprazole (PRILOSEC OTC) 20 MG tablet Take 20 mg by mouth daily.    [provider]  promethazine -dextromethorphan (PROMETHAZINE -DM) 6.25-15 MG/5ML syrup Take 5 mLs by mouth 4 (four) times daily as needed for cough. 02/25/24   Rising, Ivette Marks, PA-C  traZODone  (DESYREL ) 50 MG tablet Take 1 tablet (50 mg total) by mouth at bedtime as needed for sleep. 08/06/23 09/05/23  Lissa Riding, NP    Family History Family History  Family history unknown: Yes    Social History Social History   Tobacco Use   Smoking status: Every Day    Current packs/day: 1.50    Average packs/day: 1.5 packs/day for 5.0 years (7.5 ttl pk-yrs)    Types: Cigarettes   Smokeless tobacco: Never   Tobacco comments:    1-2 packs per day/5 years  Vaping Use   Vaping status: Never Used  Substance Use Topics   Alcohol use: Yes    Alcohol/week: 8.0 standard drinks of alcohol    Types: 8 Shots of liquor per week    Comment: a few shots/5-6 days/week   Drug use: Yes    Types: Marijuana, Methamphetamines    Comment: once a  week     Allergies   Benadryl [diphenhydramine hcl] and Metoclopramide    Review of Systems Review of Systems  Constitutional:  Positive for fatigue. Negative for chills and fever.  HENT:  Positive for congestion, postnasal drip, rhinorrhea and sore throat. Negative for ear pain.   Respiratory:  Positive for cough and wheezing. Negative for chest tightness and shortness of breath.   Gastrointestinal:  Positive for diarrhea and nausea (mild- relieved with Zofran ). Negative for vomiting.  Neurological:  Negative for dizziness, light-headedness and headaches.     Physical Exam Triage Vital Signs ED Triage Vitals  Encounter Vitals Group     BP 02/28/24 1424  (!) 144/85     Systolic BP Percentile --      Diastolic BP Percentile --      Pulse Rate 02/28/24 1424 (!) 110     Resp 02/28/24 1424 20     Temp 02/28/24 1424 98.5 F (36.9 C)     Temp Source 02/28/24 1424 Oral     SpO2 02/28/24 1424 96 %     Weight 02/28/24 1425 209 lb (94.8 kg)     Height 02/28/24 1425 5\' 8"  (1.727 m)     Head Circumference --      Peak Flow --      Pain Score 02/28/24 1424 0     Pain Loc --      Pain Education --      Exclude from Growth Chart --    No data found.  Updated Vital Signs BP (!) 144/85 (BP Location: Right Arm)   Pulse (!) 110   Temp 98.5 F (36.9 C) (Oral)   Resp 20   Ht 5\' 8"  (1.727 m)   Wt 209 lb (94.8 kg)   SpO2 96%   BMI 31.78 kg/m   Visual Acuity Right Eye Distance:   Left Eye Distance:   Bilateral Distance:    Right Eye Near:   Left Eye Near:    Bilateral Near:     Physical Exam Vitals reviewed.  Constitutional:      General: He is awake.     Appearance: Normal appearance. He is well-developed and well-groomed.  HENT:     Head: Normocephalic and atraumatic.     Right Ear: Hearing, tympanic membrane and ear canal normal.     Left Ear: Hearing, tympanic membrane and ear canal normal.     Mouth/Throat:     Lips: Pink.     Mouth: Mucous membranes are moist.     Pharynx: Oropharynx is clear. Uvula midline. No pharyngeal swelling, oropharyngeal exudate, posterior oropharyngeal erythema, uvula swelling or postnasal drip.     Tonsils: No tonsillar exudate or tonsillar abscesses.  Eyes:     General: Lids are normal. Gaze aligned appropriately.     Extraocular Movements: Extraocular movements intact.  Cardiovascular:     Rate and Rhythm: Normal rate and regular rhythm.     Heart sounds: Normal heart sounds.  Pulmonary:     Effort: Pulmonary effort is normal.     Breath sounds: Normal breath sounds. No decreased air movement. No decreased breath sounds, wheezing, rhonchi or rales.  Musculoskeletal:     Cervical back: Normal  range of motion and neck supple.  Lymphadenopathy:     Head:     Right side of head: No submental, submandibular or preauricular adenopathy.     Left side of head: No submental, submandibular or preauricular adenopathy.     Cervical:  Right cervical: No superficial cervical adenopathy.    Left cervical: No superficial cervical adenopathy.     Upper Body:     Right upper body: No supraclavicular adenopathy.     Left upper body: No supraclavicular adenopathy.  Skin:    General: Skin is warm and dry.  Neurological:     General: No focal deficit present.     Mental Status: He is alert and oriented to person, place, and time.  Psychiatric:        Mood and Affect: Mood normal.        Behavior: Behavior normal. Behavior is cooperative.        Thought Content: Thought content normal.        Judgment: Judgment normal.      UC Treatments / Results  Labs (all labs ordered are listed, but only abnormal results are displayed) Labs Reviewed - No data to display  EKG   Radiology No results found.  Procedures Procedures (including critical care time)  Medications Ordered in UC Medications - No data to display  Initial Impression / Assessment and Plan / UC Course  I have reviewed the triage vital signs and the nursing notes.  Pertinent labs & imaging results that were available during my care of the patient were reviewed by me and considered in my medical decision making (see chart for details).      Final Clinical Impressions(s) / UC Diagnoses   Final diagnoses:  Influenza A  Diarrhea of infectious origin   Patient presents today with concerns for diarrhea.  He was seen at this urgent care on 02/25/2024 and was diagnosed with influenza A as well as nausea and vomiting.  He reports that nausea and vomiting have since resolved and he is able to tolerate p.o. intake without issue but has been having diarrhea for the past 1 to 2 days.  He reports taking an antidiarrheal  medication and reports that this has helped slightly improved symptoms.  He reports that he is still feeling fatigued and rather drained but is trying to stay well-hydrated with plenty of water and electrolyte replacement beverages.  Physical exam is overall reassuring at this time.  Suspect that diarrhea and GI issues are secondary to influenza A infection and should resolve as illness runs course.  Will provide refill of Zofran  to assist with GI symptoms.  Recommend continued efforts to increase hydration status and prevent dehydration.  Recommend continued use of bland diet until symptoms are resolved with gradual reintroduction of normal diet.  ED and return precautions reviewed and provided in after visit summary.  Follow-up as needed.    Discharge Instructions      Today we discussed your concerns for ongoing diarrhea Diarrhea is usually self-limited and can have many causes- yours is most likely a symptom of Influenza A  The central tenants of management include the following: Staying well hydrated - this includes increasing water intake and supplementing with Pedialyte, Gatorade, etc. Try to eat a bland diet- this includes boiled chicken, brown rice, bananas, applesauce, plain toast, etc. As you are feeling better you can usually begin to slowly reincorporate your normal dietary choices back into your dietary consumption Using Imodium  and Pepto Bismol as needed and according to manufacturers instructions to provide assistance with managing symptoms.  I have sent in a refill of your Zofran  to assist with any nausea, vomiting.   Diarrhea with the following will need to be monitored and may require further testing: bloody diarrhea, fever, weight  loss, signs of dehydration, weakness, fatigue, incontinence, diarrhea lasting longer than 1-3 weeks   Based on your described symptoms and the duration of symptoms it is likely that you have a viral upper respiratory infection (often called a  "cold")  Symptoms can last for 3-10 days with lingering cough and intermittent symptoms potentially  lasting several  weeks after that.  The goal of treatment at this time is to reduce your symptoms and discomfort   You can use the following medications and measures to help yourself feel better until your body fights this off: DayQuil/NyQuil, TheraFlu, Alka-Seltzer  (these medications typically have the same active ingredients in them so you can choose whichever one you prefer and take consistently during the day and night according to the manufactures instructions.) Flonase A daily antihistamine such as Zyrtec, Claritin, Allegra per your preference.  Please choose 1 and take consistently. Increased fluids.  It is recommended that you take in at least 64 ounces of water per day when you are not sick so it is important to increase this when you are sick and your body may be running fever. Rest Cough drops Chloraseptic throat spray to help with sore throat Nasal saline spray or nasal flushes to help with congestion and runny nose  If your symptoms seem like they are getting worse over the next 5 to 7 days or not improving you can always follow-up here in urgent care or go to your primary care provider for further management. Go to the ER if you begin to have more serious symptoms such as shortness of breath, trouble breathing, loss of consciousness, swelling around the eyes, high fever, severe lasting headaches, vision changes or neck pain/stiffness.      ED Prescriptions     Medication Sig Dispense Auth. Provider   ondansetron  (ZOFRAN -ODT) 4 MG disintegrating tablet Take 1 tablet (4 mg total) by mouth every 8 (eight) hours as needed for nausea or vomiting. 20 tablet Staria Birkhead E, PA-C      PDMP not reviewed this encounter.   Jerona Mooring, PA-C 02/28/24 1610

## 2024-03-11 ENCOUNTER — Ambulatory Visit (HOSPITAL_COMMUNITY): Admission: EM | Admit: 2024-03-11 | Discharge: 2024-03-12 | Attending: Psychiatry | Admitting: Psychiatry

## 2024-03-11 DIAGNOSIS — F1020 Alcohol dependence, uncomplicated: Secondary | ICD-10-CM

## 2024-03-11 DIAGNOSIS — F29 Unspecified psychosis not due to a substance or known physiological condition: Secondary | ICD-10-CM | POA: Diagnosis not present

## 2024-03-11 DIAGNOSIS — F1010 Alcohol abuse, uncomplicated: Secondary | ICD-10-CM | POA: Diagnosis not present

## 2024-03-11 DIAGNOSIS — F339 Major depressive disorder, recurrent, unspecified: Secondary | ICD-10-CM | POA: Insufficient documentation

## 2024-03-11 DIAGNOSIS — R4589 Other symptoms and signs involving emotional state: Secondary | ICD-10-CM | POA: Insufficient documentation

## 2024-03-11 DIAGNOSIS — F23 Brief psychotic disorder: Secondary | ICD-10-CM

## 2024-03-11 LAB — POCT URINE DRUG SCREEN - MANUAL ENTRY (I-SCREEN)
POC Amphetamine UR: NOT DETECTED
POC Buprenorphine (BUP): NOT DETECTED
POC Cocaine UR: NOT DETECTED
POC Marijuana UR: POSITIVE — AB
POC Methadone UR: NOT DETECTED
POC Methamphetamine UR: NOT DETECTED
POC Morphine: NOT DETECTED
POC Oxazepam (BZO): POSITIVE — AB
POC Oxycodone UR: NOT DETECTED
POC Secobarbital (BAR): NOT DETECTED

## 2024-03-11 LAB — COMPREHENSIVE METABOLIC PANEL WITH GFR
ALT: 109 U/L — ABNORMAL HIGH (ref 0–44)
AST: 130 U/L — ABNORMAL HIGH (ref 15–41)
Albumin: 4.5 g/dL (ref 3.5–5.0)
Alkaline Phosphatase: 59 U/L (ref 38–126)
Anion gap: 17 — ABNORMAL HIGH (ref 5–15)
BUN: 7 mg/dL (ref 6–20)
CO2: 25 mmol/L (ref 22–32)
Calcium: 10.4 mg/dL — ABNORMAL HIGH (ref 8.9–10.3)
Chloride: 95 mmol/L — ABNORMAL LOW (ref 98–111)
Creatinine, Ser: 1.1 mg/dL (ref 0.61–1.24)
GFR, Estimated: >60 mL/min (ref >60)
Glucose, Bld: 95 mg/dL (ref 70–99)
Potassium: 3.8 mmol/L (ref 3.5–5.1)
Sodium: 137 mmol/L (ref 135–145)
Total Bilirubin: 1.6 mg/dL — ABNORMAL HIGH (ref 0.0–1.2)
Total Protein: 7.7 g/dL (ref 6.5–8.1)

## 2024-03-11 LAB — CBC WITH DIFFERENTIAL/PLATELET
Abs Immature Granulocytes: 0.05 10*3/uL (ref 0.00–0.07)
Basophils Absolute: 0.1 10*3/uL (ref 0.0–0.1)
Basophils Relative: 1 %
Eosinophils Absolute: 0.1 10*3/uL (ref 0.0–0.5)
Eosinophils Relative: 1 %
HCT: 54.7 % — ABNORMAL HIGH (ref 39.0–52.0)
Hemoglobin: 19 g/dL — ABNORMAL HIGH (ref 13.0–17.0)
Immature Granulocytes: 1 %
Lymphocytes Relative: 35 %
Lymphs Abs: 2.6 10*3/uL (ref 0.7–4.0)
MCH: 33.2 pg (ref 26.0–34.0)
MCHC: 34.7 g/dL (ref 30.0–36.0)
MCV: 95.6 fL (ref 80.0–100.0)
Monocytes Absolute: 0.7 10*3/uL (ref 0.1–1.0)
Monocytes Relative: 10 %
Neutro Abs: 3.8 10*3/uL (ref 1.7–7.7)
Neutrophils Relative %: 52 %
Platelets: 359 10*3/uL (ref 150–400)
RBC: 5.72 MIL/uL (ref 4.22–5.81)
RDW: 12.3 % (ref 11.5–15.5)
WBC: 7.3 10*3/uL (ref 4.0–10.5)
nRBC: 0 % (ref 0.0–0.2)

## 2024-03-11 LAB — ETHANOL: Alcohol, Ethyl (B): 195 mg/dL — ABNORMAL HIGH (ref <15)

## 2024-03-11 LAB — TSH: TSH: 2.348 u[IU]/mL (ref 0.350–4.500)

## 2024-03-11 MED ORDER — OLANZAPINE 10 MG IM SOLR: 5.0000 mg | Freq: Three times a day (TID) | INTRAMUSCULAR | Status: DC | PRN

## 2024-03-11 MED ORDER — OLANZAPINE 10 MG IM SOLR: 10.0000 mg | Freq: Three times a day (TID) | INTRAMUSCULAR | Status: DC | PRN

## 2024-03-11 MED ORDER — OLANZAPINE 5 MG PO TBDP: 5.0000 mg | ORAL_TABLET | Freq: Three times a day (TID) | ORAL | Status: DC | PRN

## 2024-03-11 MED ORDER — TRAZODONE HCL 50 MG PO TABS
50.0000 mg | ORAL_TABLET | Freq: Every evening | ORAL | Status: DC | PRN
  Administered 2024-03-12: 50 mg via ORAL
  Filled 2024-03-11: qty 1

## 2024-03-11 MED ORDER — ACETAMINOPHEN 325 MG PO TABS
650.0000 mg | ORAL_TABLET | Freq: Four times a day (QID) | ORAL | Status: DC | PRN
  Administered 2024-03-12 (×2): 650 mg via ORAL
  Filled 2024-03-11 (×3): qty 2

## 2024-03-11 MED ORDER — ALUM & MAG HYDROXIDE-SIMETH 200-200-20 MG/5ML PO SUSP: 30.0000 mL | ORAL | Status: DC | PRN

## 2024-03-11 MED ORDER — MAGNESIUM HYDROXIDE 400 MG/5ML PO SUSP: 30.0000 mL | Freq: Every day | ORAL | Status: DC | PRN

## 2024-03-11 NOTE — Progress Notes (Signed)
   03/11/24 1827  [facility] Triage Screening (Walk-ins at [facility] only)  How Did You Hear About Us ? Self  What Is the Reason for Your Visit/Call Today? Lamphier is a 33 year old male presenting to [facility] unaccompanied. Pt states he had a recent death in the family and he has felt hopeless since then. Pt states he was ready to end his life 30 minutes ago by stepping infront of a truck. Pt mentions he drank alcohol today and smoked marijuana, but is unsure on how much. Pt states he is drinking a pint of alcohol daily. Pt also reports that he was clean since the last time he was here. Pt continues to state in triage that he is a failure. Pt mentions he would try to end his life if he were to leave today. Pt is looking to be admitted for inpatient. Pt denies Hi and AVH currently.  Have You Recently Had Any Thoughts About Hurting Yourself? Yes  How long ago did you have thoughts about hurting yourself? today  Are You Planning to Commit Suicide/Harm Yourself At This time? Yes  Have you Recently Had Thoughts About Hurting Someone [name]? No  Are You Planning To Harm Someone At This Time? No  Physical Abuse Denies  Verbal Abuse Denies  Sexual Abuse Denies  Exploitation of patient/patient's resources Denies  Self-Neglect Denies  Possible abuse reported to: Other (Comment)  Are you currently experiencing any auditory, visual or other hallucinations? No  Have You Used Any Alcohol or Drugs in the Past 24 Hours? Yes  What Did You Use and How Much? alcohol, marijuana, unknown amount  Do you have any current medical co-morbidities that require immediate attention? No  Clinician description of patient physical appearance/behavior: tearful, cooperative, hopeless  What Do You Feel Would Help You the Most Today? Treatment for Depression or other mood problem  If access to [facility] was not available, would you have sought care in the Emergency Department? No  Determination of Need Urgent (48 hours)  Options For  Referral Intensive Outpatient Therapy;Inpatient Hospitalization  Determination of Need filed? Yes

## 2024-03-11 NOTE — ED Notes (Signed)
 33 yr male who presents voluntary  in no acute distress for the treatment of SI attempt placed in observation. Pt appears flat and depressed, with a raised area on the forehead where the pt hit his head against a not specified object. "I was just being stupid, dealing w/a lot recent loss in my family". Pt was calm and cooperative with admission process and skin check. Pt presents with passive SI and contracts for safety upon admission verbally. Pt denies AVH.   Skin was assessed and found to be clear of any abnormal marks apart from a bump, abrasion on forehead, tattoo lt upper arm, and healed marks "They're from cooking".   Pt searched and no contraband found, POC and unit policies explained and understanding verbalized. Consents obtained. Food and fluids offered, and both accepted. Pt ambulated on to unit, forehead w/bump cleansed and covered w/small mepilex. Ice pack given to be placed on forehead after finishing meal.  Pt had no additional questions or concerns.  Aaron Aas

## 2024-03-11 NOTE — BH Assessment (Signed)
 Comprehensive Clinical Assessment (CCA) Note  03/11/2024 [name] [id number]  Disposition: Dorthea Gauze, NP, recommends continuous observation for safety and stabilization with psych reassessment in the AM.   Chief Complaint:  Chief Complaint  Patient presents with   Suicide Attempt   Visit Diagnosis:  Major Depressive Disorder  The patient demonstrates the following risk factors for suicide: Chronic risk factors for suicide include: SI, OD, MDD and polysubstance abuse. Acute risk factors for suicide include: family or marital conflict and social withdrawal/isolation. Protective factors for this patient include: positive therapeutic relationship, responsibility to others (children, family), and hope for the future. Considering these factors, the overall suicide risk at this point appears to be high. Patient is not appropriate for outpatient follow up.  Brett Sanford is a 33 year old male presenting as a voluntary walk-in to [facility] due to [facility] with plan to jump in traffic. Patient has history of SI, OD, MDD alcohol abuse and polysubstance abuse. Patient denied HI, psychosis and drug usage. Per staff, patient was banging his head against table after arrival to [facility]. Patient has bruise on his forehead.   Patient reports prior to arrival that he was suicidal and planning to step in front of a truck to end his life. Patient reports stressors include grief/loss issues of aunts death 1 month ago. Patient reports he relapsed 1 month ago and has been drinking daily 1 pint to a 5th of alcohol daily. Patient reports drinking heavy since 33 years old.  Patient reports worsening depressive symptoms. Patient reports sporadic sleep and fluctuating appetite.   Patient reports SI with attempt 3 years ago by overdosing on sleeping pills. Patient last psych hospitalization was 10/222/24 at [facility].   Patient does not have a therapist or psychiatrist. Patient is not prescribed any psych medications.    Patient resides with his mother and has no children. 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 Patient reported no work related stressors. Patient denied access to guns. Patient was calm and cooperative during assessment. Patient seeking treatment for mental health. Patient unable to contract for safety.     CCA Screening, Triage and Referral (STR)  Patient Reported Information How did you hear about us ? Self  What Is the Reason for Your Visit/Call Today? [name] is a 33 year old male presenting to [facility] unaccompanied. Pt states he had a recent death in the family and he has felt hopeless since then. Pt states he was ready to end his life 30 minutes ago by stepping infront of a truck. Pt mentions he drank alcohol today and smoked marijuana, but is unsure on how much. Pt states he is drinking a pint of alcohol daily. Pt also reports that he was clean since the last time he was here. Pt continues to state in triage that he is a failure. Pt mentions he would try to end his life if he were to leave today. Pt is looking to be admitted for inpatient. Pt denies Hi and AVH currently.  How Long Has This Been Causing You Problems? 1 wk - 1 month  What Do You Feel Would Help You the Most Today? Treatment for Depression or other mood problem   Have You Recently Had Any Thoughts About Hurting Yourself? Yes  Are You Planning to Commit Suicide/Harm Yourself At This time? Yes   Flowsheet Row ED from 03/11/2024 in [facility] UC from 02/28/2024 in [facility] UC from 02/25/2024 in [facility] at [facility])  C-SSRS RISK CATEGORY High Risk No Risk No Risk       Have you Recently Had Thoughts About Hurting Someone [name]? No  Are You Planning to Harm Someone at This Time? No  Explanation: n/a   Have You Used Any Alcohol or Drugs in the Past 24 Hours? Yes  How Long Ago Did You Use Drugs or Alcohol?  N/a What Did You Use and How Much? alcohol, marijuana, unknown amount   Do You Currently Have a Therapist/Psychiatrist? No  Name of Therapist/Psychiatrist:  n/a  Have You Been Recently Discharged From Any Office Practice or Programs? No  Explanation of Discharge From Practice/Program: N/A     CCA Screening Triage Referral Assessment Type of Contact: Face-to-Face  Telemedicine Service Delivery:  n/a Is this Initial or Reassessment?  N/a Date Telepsych consult ordered in [facility]:   N/a Time Telepsych consult ordered in [facility]:   N/a Location of Assessment: [facility] Assessment Services  Provider Location: [facility] Assessment Services   Collateral Involvement: none   Does Patient Have a Automotive engineer Guardian? No  Legal Guardian Contact Information: n/a  Copy of Legal Guardianship Form: -- (n/a)  Legal Guardian Notified of Arrival: -- (n/a)  Legal Guardian Notified of Pending Discharge: -- (n/a)  If Minor and Not Living with Parent(s), Who has Custody? n/a  Is CPS involved or ever been involved? Never  Is APS involved or ever been involved? Never   Patient Determined To Be At Risk for Harm To Self or Others Based on Review of Patient Reported Information or Presenting Complaint? Yes, for Self-Harm  Method: Plan with intent and identified person  Availability of Means: In hand or used  Intent: Clearly intends on inflicting harm that could cause death  Notification Required: Another person is identifiable and needs to be warned to ensure safety (DUTY TO WARN)  Additional Information for Danger to Others Potential: -- (n/a)  Additional Comments for Danger to Others Potential: n/a  Are There Guns or Other Weapons in Your Home? No  Types of Guns/Weapons: n/a  Are These Weapons Safely Secured?                            -- (n/a)  Who Could Verify You Are Able To Have These Secured: n/a  Do You Have any Outstanding Charges, Pending Court Dates, Parole/Probation?  none  Contacted To Inform of Risk of Harm To Self or Others: Other: Comment    Does Patient Present under Involuntary Commitment? No    [place] of Residence: [place]   Patient Currently Receiving the Following Services: Not Receiving Services   Determination of Need: Urgent (48 hours)   Options For Referral: Intensive Outpatient Therapy; Inpatient Hospitalization     CCA Biopsychosocial Patient Reported Schizophrenia/Schizoaffective Diagnosis in Past: No   Strengths: self-awareness   Mental Health Symptoms Depression:  Change in energy/activity; Difficulty Concentrating; Hopelessness; Increase/decrease in appetite; Sleep (too much or little); Tearfulness; Worthlessness; Fatigue   Duration of Depressive symptoms: Duration of Depressive Symptoms: Greater than two weeks   Mania:  None   Anxiety:   Difficulty concentrating; Sleep; Restlessness; Worrying; Tension; Fatigue   Psychosis:  None   Duration of Psychotic symptoms:    Trauma:  None   Obsessions:  None   Compulsions:  None   Inattention:  None   Hyperactivity/Impulsivity:  None   Oppositional/Defiant Behaviors:  None   Emotional Irregularity:  Recurrent suicidal behaviors/gestures/threats   Other Mood/Personality Symptoms:  n/a    Mental Status Exam Appearance and self-care  Stature:  Average   Weight:  Average weight   Clothing:  Casual   Grooming:  Normal   Cosmetic use:  None   Posture/gait:  Normal   Motor activity:  Not Remarkable   Sensorium  Attention:  Normal   Concentration:  Normal   Orientation:  X5   Recall/memory:  Normal   Affect and Mood  Affect:  Depressed   Mood:  Depressed   Relating  Eye contact:  Normal   Facial expression:  Responsive; Depressed   Attitude toward examiner:  Cooperative   Thought and Language  Speech flow: Clear and Coherent   Thought content:  Appropriate to Mood and Circumstances   Preoccupation:  None   Hallucinations:   None   Organization:  Coherent   [employer] of Knowledge:  Average   Intelligence:  Average   Abstraction:  Normal   Judgement:  Normal   Reality Testing:  Adequate   Insight:  Fair   Decision Making:  Impulsive   Social Functioning  Social Maturity:  Impulsive; Isolates   Social Judgement:  Normal   Stress  Stressors:  Surveyor, quantity; Relationship; Work; Family conflict; Grief/losses   Coping Ability:  Deficient supports; Overwhelmed   Skill Deficits:  Self-control; Communication; Decision making   Supports:  Family; Support needed     Religion: Religion/Spirituality Are You A Religious Person?: Yes How Might This Affect Treatment?: none  Leisure/Recreation: Leisure / Recreation Do You Have Hobbies?: Yes Leisure and Hobbies: fishing and outdoors  Exercise/Diet: Exercise/Diet Do You Exercise?: No Have You Gained or Lost A Significant Amount of Weight in the Past Six Months?: No Do You Follow a Special Diet?: No Do You Have Any Trouble Sleeping?: Yes Explanation of Sleeping Difficulties: sporadic   CCA Employment/Education Employment/Work Situation: Employment / Work Situation Employment Situation: Employed Work Stressors: none reported Patient's Job has Been Impacted by Current Illness: No Has Patient ever Been in Equities trader?: No  Education: Education Is Patient Currently Attending School?: No Last Grade Completed: 12 Did You Product manager?: No Did You Have An Individualized Education Program (IIEP): No Did You Have Any Difficulty At [employer]?: Yes Were Any Medications Ever Prescribed For These Difficulties?: No Patient's Education Has Been Impacted by Current Illness: No   CCA Family/Childhood History Family and Relationship History: Family history Marital status: Single Does patient have children?: No  Childhood History:  Childhood History By whom was/is the patient raised?: Both parents, Mother (pt primarily raised by  mother. his father passed away at an early age) Did patient suffer any verbal/emotional/physical/sexual abuse as a child?: No Did patient suffer from severe childhood neglect?: No Has patient ever been sexually abused/assaulted/raped as an adolescent or adult?: No Was the patient ever a victim of a crime or a disaster?: No Witnessed domestic violence?: No Has patient been affected by domestic violence as an adult?: No       CCA Substance Use Alcohol/Drug Use: Alcohol / Drug Use Pain Medications: See MAR Prescriptions: See MAR Over the Counter: See MAR History of alcohol / drug use?: Yes Longest period of sobriety (when/how long): 2020-2022 Negative Consequences of Use: Financial Withdrawal Symptoms: DTs, Nausea / Vomiting, Tremors, Sweats, Change in blood pressure                         ASAM's:  Six  Dimensions of Multidimensional Assessment  Dimension 1:  Acute Intoxication and/or Withdrawal Potential:   Dimension 1:  Description of individual's past and current experiences of substance use and withdrawal: He reports history of ETOH, THC, Meth and Xanax. He reports history of DT's, nausea, vomiting, sweating, and shaking when withdrawing from substances. Patient currently reports using alcohol daily for the past 1 month, 1 pint to a 5th of liquor.  Dimension 2:  Biomedical Conditions and Complications:   Dimension 2:  Description of patient's biomedical conditions and  complications: Gastritis, and history of DT's.  Dimension 3:  Emotional, Behavioral, or Cognitive Conditions and Complications:  Dimension 3:  Description of emotional, behavioral, or cognitive conditions and complications: SI with plan to jump in traffic.  Dimension 4:  Readiness to Change:  Dimension 4:  Description of Readiness to Change criteria: Seeking help  Dimension 5:  Relapse, Continued use, or Continued Problem Potential:  Dimension 5:  Relapse, continued use, or continued problem potential  critiera description: He reports sobriety from 2020-2022, but does have a significant history of relapsing.  Dimension 6:  Recovery/Living Environment:  Dimension 6:  Recovery/Iiving environment criteria description: He lives with his mother and she is supportive.  ASAM Severity Score: ASAM's Severity Rating Score: 6  ASAM Recommended Level of Treatment: ASAM Recommended Level of Treatment: Level I Outpatient Treatment   Substance use Disorder (SUD) Substance Use Disorder (SUD)  Checklist Symptoms of Substance Use: Continued use despite having a persistent/recurrent physical/psychological problem caused/exacerbated by use  Recommendations for Services/Supports/Treatments: Recommendations for Services/Supports/Treatments Recommendations For Services/Supports/Treatments: Inpatient Hospitalization, Medication Management, Individual Therapy  Disposition Recommendation per psychiatric provider:  Recommends continuous observation.    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   Drug overdose, intentional self-harm, initial encounter (HCC) 10/07/2019   Alcoholic intoxication without complication (HCC)    Suicide attempt (HCC)    Viral illness 08/21/2019   Hypokalemia 06/23/2019   Attention deficit hyperactivity disorder (ADHD) 06/23/2019   Seizure-like activity (HCC) 06/23/2019   History of alcohol abuse 06/23/2019   Chronic migraine 06/23/2019   Diarrhea 06/23/2019     Referrals to Alternative Service(s): Referred to Alternative Service(s):   Place:   Date:   Time:    Referred to Alternative Service(s):   Place:   Date:   Time:    Referred to Alternative Service(s):   Place:   Date:   Time:    Referred to Alternative Service(s):   Place:   Date:   Time:     Adelfa Adolph, [facility]

## 2024-03-11 NOTE — ED Provider Notes (Signed)
 [facility] Continuous Assessment Admission H&P  Date: 03/12/24 Patient Name: Brett Sanford MRN: [id number] Chief Complaint: Not in a good head space right now   Diagnoses:  Final diagnoses:  Psychosis, brief reactive (HCC)  Recurrent major depressive disorder, remission status unspecified (HCC)  Ineffective coping    HPI: Brett Sanford,  33 y/o male with a history of SI, OD,  MDD alcohol abuse and polysubstance abuse,  presented to [facility] voluntarily.  According to security,  pt was seen banding his head again the table.  Per the patient he is not in a good head space right now.  When as what does he mean,  pt stated his Auntie die a few months ago and she was the only family she had on his father side.  Pt report he is going through a lot right now.  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  According to patient he does not have an immediate plan but he does have suicidal thoughts.   Pt denies currently seeing a psychiatrist or therapist at this time. Per the patient he has been hospitalize on multiple occasion  for SI and OD.  Pt report he last use marijuana today and he has used meth and cocaine in the past.  Face to face evaluation of patient,  he is alert and oriented x 4,  speech is clear,  maintain fair eye contact.  Patient appears very depressed, very anxious.  Patient when asked if he is suicidal stated he has suicidal thoughts but has no immediate plans.  Patient denies HI, AVH.  Report he drinks alcohol daily.  Also smoked marijuana last time was today reports that he used cocaine and meth in the past but not recently.  Patient denies access to guns.  Patient does have a raised area to his forehead from banging his head on the table.  Nursing staff was instructed to wipe area and apply ice to the area.  Patient denies any pain denies any dizziness.  Writer discussed with patient that given his past history and current presentation we will admit him for observation and  further evaluation.  Pain patient in agreement with care.  Recommend observation unit  Total Time spent with patient: 20 minutes  Musculoskeletal  Strength & Muscle Tone: within normal limits Gait & Station: normal Patient leans: N/A  Psychiatric Specialty Exam  Presentation General Appearance:  Casual  Eye Contact: Fair  Speech: Clear and Coherent  Speech Volume: Normal  Handedness: Right   Mood and Affect  Mood: Anxious; Angry; Depressed; Hopeless  Affect: Blunt   Thought Process  Thought Processes: Coherent  Descriptions of Associations:Intact  Orientation:Full (Time, Place and Person)  Thought Content:WDL  Diagnosis of Schizophrenia or Schizoaffective disorder in past: No   Hallucinations:Hallucinations: None  Ideas of Reference:None  Suicidal Thoughts:Suicidal Thoughts: Yes, Passive SI Passive Intent and/or Plan: With Intent; Without Plan  Homicidal Thoughts:Homicidal Thoughts: No   Sensorium  Memory: Immediate Fair  Judgment: Poor  Insight: Lacking   Executive Functions  Concentration: Good  Attention Span: Good  Recall: Good  Fund of Knowledge: Good  Language: Good   Psychomotor Activity  Psychomotor Activity: Psychomotor Activity: Normal   Assets  Assets: Desire for Improvement; Resilience; Social Support   Sleep  Sleep: Sleep: Fair Number of Hours of Sleep: 6   Nutritional Assessment (For OBS and FBC admissions only) Has the patient had a weight loss or gain of 10 pounds or more in the  last 3 months?: No Has the patient had a decrease in food intake/or appetite?: No Does the patient have dental problems?: No Does the patient have eating habits or behaviors that may be indicators of an eating disorder including binging or inducing vomiting?: No Has the patient recently lost weight without trying?: 0 Has the patient been eating poorly because of a decreased appetite?: 0 Malnutrition Screening Tool  Score: 0    Physical Exam HENT:     Head: Normocephalic.     Nose: Nose normal.  Eyes:     Pupils: Pupils are equal, round, and reactive to light.  Cardiovascular:     Rate and Rhythm: Normal rate.  Pulmonary:     Effort: Pulmonary effort is normal.  Musculoskeletal:        General: Normal range of motion.     Cervical back: Normal range of motion.  Neurological:     General: No focal deficit present.     Mental Status: He is alert.  Psychiatric:        Mood and Affect: Mood normal.        Behavior: Behavior normal.        Thought Content: Thought content normal.        Judgment: Judgment normal.    Review of Systems  Constitutional: Negative.   HENT: Negative.    Eyes: Negative.   Respiratory: Negative.    Cardiovascular: Negative.   Gastrointestinal: Negative.   Genitourinary: Negative.   Musculoskeletal: Negative.   Skin: Negative.   Neurological: Negative.   Psychiatric/Behavioral:  Positive for depression, substance abuse and suicidal ideas. The patient is nervous/anxious.     Blood pressure (!) 148/97, pulse 80, temperature 98 F (36.7 C), temperature source Oral, resp. rate 18, SpO2 100%. There is no height or weight on file to calculate BMI.  Past Psychiatric History: MDD, ODD, SI.  Polysubstance  Is the patient at risk to self? Yes  Has the patient been a risk to self in the past 6 months? No .    Has the patient been a risk to self within the distant past? Yes   Is the patient a risk to others? No   Has the patient been a risk to others in the past 6 months? No   Has the patient been a risk to others within the distant past? No   Past Medical History: See chart  Family History: Unknown  Social History: Alcohol, cocaine, marijuana  Last Labs:  Admission on 03/11/2024  Component Date Value Ref Range Status   WBC 03/11/2024 7.3  4.0 - 10.5 K/uL Final   RBC 03/11/2024 5.72  4.22 - 5.81 MIL/uL Final   Hemoglobin 03/11/2024 19.0 (H)  13.0 - 17.0  g/dL Final   HCT 16/07/9603 54.7 (H)  39.0 - 52.0 % Final   MCV 03/11/2024 95.6  80.0 - 100.0 fL Final   MCH 03/11/2024 33.2  26.0 - 34.0 pg Final   MCHC 03/11/2024 34.7  30.0 - 36.0 g/dL Final   RDW 54/06/8118 12.3  11.5 - 15.5 % Final   Platelets 03/11/2024 359  150 - 400 K/uL Final   nRBC 03/11/2024 0.0  0.0 - 0.2 % Final   Neutrophils Relative % 03/11/2024 52  % Final   Neutro Abs 03/11/2024 3.8  1.7 - 7.7 K/uL Final   Lymphocytes Relative 03/11/2024 35  % Final   Lymphs Abs 03/11/2024 2.6  0.7 - 4.0 K/uL Final   Monocytes Relative 03/11/2024 10  %  Final   Monocytes Absolute 03/11/2024 0.7  0.1 - 1.0 K/uL Final   Eosinophils Relative 03/11/2024 1  % Final   Eosinophils Absolute 03/11/2024 0.1  0.0 - 0.5 K/uL Final   Basophils Relative 03/11/2024 1  % Final   Basophils Absolute 03/11/2024 0.1  0.0 - 0.1 K/uL Final   Immature Granulocytes 03/11/2024 1  % Final   Abs Immature Granulocytes 03/11/2024 0.05  0.00 - 0.07 K/uL Final   Performed at [facility], [address] [place], [place] [address]   Sodium 03/11/2024 137  135 - 145 mmol/L Final   Potassium 03/11/2024 3.8  3.5 - 5.1 mmol/L Final   Chloride 03/11/2024 95 (L)  98 - 111 mmol/L Final   CO2 03/11/2024 25  22 - 32 mmol/L Final   Glucose, Bld 03/11/2024 95  70 - 99 mg/dL Final   Glucose reference range applies only to samples taken after fasting for at least 8 hours.   BUN 03/11/2024 7  6 - 20 mg/dL Final   Creatinine, Ser 03/11/2024 1.10  0.61 - 1.24 mg/dL Final   Calcium  03/11/2024 10.4 (H)  8.9 - 10.3 mg/dL Final   Total Protein 60/45/4098 7.7  6.5 - 8.1 g/dL Final   Albumin 11/91/4782 4.5  3.5 - 5.0 g/dL Final   AST 95/62/1308 130 (H)  15 - 41 U/L Final   ALT 03/11/2024 109 (H)  0 - 44 U/L Final   Alkaline Phosphatase 03/11/2024 59  38 - 126 U/L Final   Total Bilirubin 03/11/2024 1.6 (H)  0.0 - 1.2 mg/dL Final   GFR, Estimated 03/11/2024 >60  >60 mL/min Final   Comment: (NOTE) Calculated using the CKD-EPI  Creatinine Equation (2021)    Anion gap 03/11/2024 17 (H)  5 - 15 Final   Performed at [facility], [address] [place], [place] [address]   Alcohol, Ethyl (B) 03/11/2024 195 (H)  <15 mg/dL Final   Comment: (NOTE) For medical purposes only. Performed at [facility], [address] [place], [place] [address]    TSH 03/11/2024 2.348  0.350 - 4.500 uIU/mL Final   Comment: Performed by a 3rd Generation assay with a functional sensitivity of <=0.01 uIU/mL. Performed at [facility], [address], [place], [place] [address]    POC Amphetamine UR 03/11/2024 None Detected  NONE DETECTED (Cut Off Level 1000 ng/mL) Final   POC Secobarbital (BAR) 03/11/2024 None Detected  NONE DETECTED (Cut Off Level 300 ng/mL) Final   POC Buprenorphine (BUP) 03/11/2024 None Detected  NONE DETECTED (Cut Off Level 10 ng/mL) Final   POC Oxazepam (BZO) 03/11/2024 Positive (A)  NONE DETECTED (Cut Off Level 300 ng/mL) Final   POC Cocaine UR 03/11/2024 None Detected  NONE DETECTED (Cut Off Level 300 ng/mL) Final   POC Methamphetamine UR 03/11/2024 None Detected  NONE DETECTED (Cut Off Level 1000 ng/mL) Final   POC Morphine  03/11/2024 None Detected  NONE DETECTED (Cut Off Level 300 ng/mL) Final   POC Methadone UR 03/11/2024 None Detected  NONE DETECTED (Cut Off Level 300 ng/mL) Final   POC Oxycodone UR 03/11/2024 None Detected  NONE DETECTED (Cut Off Level 100 ng/mL) Final   POC Marijuana UR 03/11/2024 Positive (A)  NONE DETECTED (Cut Off Level 50 ng/mL) Final  Admission on 02/25/2024, Discharged on 02/25/2024  Component Date Value Ref Range Status   Influenza A, POC 02/25/2024 Positive (A)  Negative Final   Influenza B, POC 02/25/2024 Negative  Negative Final  Admission on 10/11/2023, Discharged on 10/11/2023  Component Date Value Ref Range Status   Sodium 10/11/2023 137  135 - 145 mmol/L Final   Potassium 10/11/2023 3.4 (L)  3.5 - 5.1 mmol/L Final   Chloride 10/11/2023 104  98 - 111 mmol/L  Final   CO2 10/11/2023 20 (L)  22 - 32 mmol/L Final   Glucose, Bld 10/11/2023 100 (H)  70 - 99 mg/dL Final   Glucose reference range applies only to samples taken after fasting for at least 8 hours.   BUN 10/11/2023 8  6 - 20 mg/dL Final   Creatinine, Ser 10/11/2023 0.81  0.61 - 1.24 mg/dL Final   Calcium  10/11/2023 9.1  8.9 - 10.3 mg/dL Final   GFR, Estimated 10/11/2023 >60  >60 mL/min Final   Comment: (NOTE) Calculated using the CKD-EPI Creatinine Equation (2021)    Anion gap 10/11/2023 13  5 - 15 Final   Performed at [facility], [address] [place], [place] [address]   WBC 10/11/2023 6.9  4.0 - 10.5 K/uL Final   RBC 10/11/2023 5.03  4.22 - 5.81 MIL/uL Final   Hemoglobin 10/11/2023 16.8  13.0 - 17.0 g/dL Final   HCT 60/45/4098 48.3  39.0 - 52.0 % Final   MCV 10/11/2023 96.0  80.0 - 100.0 fL Final   MCH 10/11/2023 33.4  26.0 - 34.0 pg Final   MCHC 10/11/2023 34.8  30.0 - 36.0 g/dL Final   RDW 11/91/4782 12.3  11.5 - 15.5 % Final   Platelets 10/11/2023 313  150 - 400 K/uL Final   nRBC 10/11/2023 0.0  0.0 - 0.2 % Final   Performed at [facility], [address] [place], [place] [address]   Troponin I (High Sensitivity) 10/11/2023 2  <18 ng/L Final   Comment: (NOTE) Elevated high sensitivity troponin I (hsTnI) values and significant  changes across serial measurements may suggest ACS but many other  chronic and acute conditions are known to elevate hsTnI results.  Refer to the "Links" section for chest pain algorithms and additional  guidance. Performed at [facility], [address] [place], [place] [address]     Allergies: Benadryl [diphenhydramine hcl] and Metoclopramide   Medications:  Facility Ordered Medications  Medication   acetaminophen  (TYLENOL ) tablet 650 mg   alum & mag hydroxide-simeth (MAALOX/MYLANTA) 200-200-20 MG/5ML suspension 30 mL   magnesium  hydroxide (MILK OF MAGNESIA) suspension 30 mL    OLANZapine zydis (ZYPREXA) disintegrating tablet 5 mg   OLANZapine (ZYPREXA) injection 5 mg   OLANZapine (ZYPREXA) injection 10 mg   traZODone  (DESYREL ) tablet 50 mg   dicyclomine  (BENTYL ) tablet 20 mg   hydrOXYzine  (ATARAX ) tablet 25 mg   methocarbamol (ROBAXIN) tablet 500 mg   naproxen  (NAPROSYN ) tablet 500 mg   ondansetron  (ZOFRAN -ODT) disintegrating tablet 4 mg   [COMPLETED] thiamine  (VITAMIN B1) injection 100 mg   [START ON 03/13/2024] thiamine  (VITAMIN B1) tablet 100 mg   multivitamin with minerals tablet 1 tablet   LORazepam  (ATIVAN ) tablet 1 mg   hydrOXYzine  (ATARAX ) tablet 25 mg   loperamide  (IMODIUM ) capsule 2-4 mg   [COMPLETED] acetaminophen  (TYLENOL ) tablet 650 mg   PTA Medications  Medication Sig   omeprazole (PRILOSEC OTC) 20 MG tablet Take 20 mg by mouth daily.   promethazine -dextromethorphan (PROMETHAZINE -DM) 6.25-15 MG/5ML syrup Take 5 mLs by mouth 4 (four) times daily as needed for cough.   ondansetron  (ZOFRAN -ODT) 4 MG disintegrating tablet Take 1 tablet (4 mg total)  by mouth every 6 (six) hours as needed for nausea or vomiting.   ondansetron  (ZOFRAN -ODT) 4 MG disintegrating tablet Take 1 tablet (4 mg total) by mouth every 8 (eight) hours as needed for nausea or vomiting.    Screenings    Flowsheet Row Most Recent Value  CIWA-Ar Total 9       Medical Decision Making  Observation unit Meds ordered this encounter  Medications   acetaminophen  (TYLENOL ) tablet 650 mg   alum & mag hydroxide-simeth (MAALOX/MYLANTA) 200-200-20 MG/5ML suspension 30 mL   magnesium  hydroxide (MILK OF MAGNESIA) suspension 30 mL   OLANZapine zydis (ZYPREXA) disintegrating tablet 5 mg   OLANZapine (ZYPREXA) injection 5 mg   OLANZapine (ZYPREXA) injection 10 mg   traZODone  (DESYREL ) tablet 50 mg   dicyclomine  (BENTYL ) tablet 20 mg   hydrOXYzine  (ATARAX ) tablet 25 mg   DISCONTD: loperamide  (IMODIUM ) capsule 2-4 mg   methocarbamol (ROBAXIN) tablet 500 mg   naproxen  (NAPROSYN ) tablet  500 mg   ondansetron  (ZOFRAN -ODT) disintegrating tablet 4 mg   thiamine  (VITAMIN B1) injection 100 mg   thiamine  (VITAMIN B1) tablet 100 mg   multivitamin with minerals tablet 1 tablet   LORazepam  (ATIVAN ) tablet 1 mg   hydrOXYzine  (ATARAX ) tablet 25 mg   loperamide  (IMODIUM ) capsule 2-4 mg   DISCONTD: ondansetron  (ZOFRAN -ODT) disintegrating tablet 4 mg   acetaminophen  (TYLENOL ) tablet 650 mg    Lab Orders         CBC with Differential/Platelet         Comprehensive metabolic panel         Ethanol         TSH         POCT Urine Drug Screen - (I-Screen)        Recommendations  Based on my evaluation the patient does not appear to have an emergency medical condition.  Dorthea Gauze, NP 03/12/24  4:20 AM

## 2024-03-12 ENCOUNTER — Inpatient Hospital Stay
Admission: AD | Admit: 2024-03-12 | Discharge: 2024-03-20 | DRG: 897 | Disposition: A | Discharge status: Home / Self Care | Payer: 59 | Source: Intra-hospital | Attending: Psychiatry | Admitting: Psychiatry

## 2024-03-12 ENCOUNTER — Other Ambulatory Visit: Payer: Self-pay

## 2024-03-12 DIAGNOSIS — Z604 Social exclusion and rejection: Secondary | ICD-10-CM | POA: Diagnosis present

## 2024-03-12 DIAGNOSIS — Z9152 Personal history of nonsuicidal self-harm: Secondary | ICD-10-CM | POA: Diagnosis not present

## 2024-03-12 DIAGNOSIS — F1020 Alcohol dependence, uncomplicated: Principal | ICD-10-CM | POA: Diagnosis present

## 2024-03-12 DIAGNOSIS — Z5971 Insufficient health insurance coverage: Secondary | ICD-10-CM | POA: Diagnosis not present

## 2024-03-12 DIAGNOSIS — Z87891 Personal history of nicotine dependence: Secondary | ICD-10-CM

## 2024-03-12 DIAGNOSIS — Z888 Allergy status to other drugs, medicaments and biological substances status: Secondary | ICD-10-CM

## 2024-03-12 DIAGNOSIS — Z79899 Other long term (current) drug therapy: Secondary | ICD-10-CM | POA: Diagnosis not present

## 2024-03-12 DIAGNOSIS — F419 Anxiety disorder, unspecified: Secondary | ICD-10-CM | POA: Diagnosis present

## 2024-03-12 DIAGNOSIS — R45851 Suicidal ideations: Secondary | ICD-10-CM | POA: Diagnosis present

## 2024-03-12 DIAGNOSIS — F329 Major depressive disorder, single episode, unspecified: Secondary | ICD-10-CM | POA: Diagnosis present

## 2024-03-12 MED ORDER — LORAZEPAM 1 MG PO TABS
1.0000 mg | ORAL_TABLET | Freq: Four times a day (QID) | ORAL | Status: AC | PRN
  Administered 2024-03-12: 1 mg via ORAL
  Filled 2024-03-12: qty 1

## 2024-03-12 MED ORDER — PANTOPRAZOLE SODIUM 40 MG PO TBEC
40.0000 mg | DELAYED_RELEASE_TABLET | Freq: Every day | ORAL | Status: AC
  Administered 2024-03-13 – 2024-03-19 (×7): 40 mg via ORAL
  Filled 2024-03-12 (×7): qty 1

## 2024-03-12 MED ORDER — ONDANSETRON 4 MG PO TBDP
4.0000 mg | ORAL_TABLET | Freq: Four times a day (QID) | ORAL | Status: AC | PRN
  Administered 2024-03-13 – 2024-03-15 (×4): 4 mg via ORAL
  Filled 2024-03-12 (×4): qty 1

## 2024-03-12 MED ORDER — PANTOPRAZOLE SODIUM 40 MG PO TBEC
40.0000 mg | DELAYED_RELEASE_TABLET | ORAL | Status: AC
  Administered 2024-03-12: 40 mg via ORAL
  Filled 2024-03-12: qty 1

## 2024-03-12 MED ORDER — DICYCLOMINE HCL 20 MG PO TABS
20.0000 mg | ORAL_TABLET | Freq: Four times a day (QID) | ORAL | Status: AC | PRN
  Administered 2024-03-13 – 2024-03-15 (×4): 20 mg via ORAL
  Filled 2024-03-12 (×5): qty 1

## 2024-03-12 MED ORDER — THIAMINE MONONITRATE 100 MG PO TABS: 100.0000 mg | ORAL_TABLET | Freq: Every day | ORAL | Status: DC

## 2024-03-12 MED ORDER — HYDROXYZINE HCL 25 MG PO TABS
25.0000 mg | ORAL_TABLET | Freq: Four times a day (QID) | ORAL | Status: DC | PRN
  Administered 2024-03-12 (×2): 25 mg via ORAL
  Filled 2024-03-12 (×2): qty 1

## 2024-03-12 MED ORDER — THIAMINE MONONITRATE 100 MG PO TABS
100.0000 mg | ORAL_TABLET | Freq: Every day | ORAL | Status: DC
  Administered 2024-03-13 – 2024-03-20 (×8): 100 mg via ORAL
  Filled 2024-03-12 (×8): qty 1

## 2024-03-12 MED ORDER — NAPROXEN 500 MG PO TABS: 500.0000 mg | ORAL_TABLET | Freq: Two times a day (BID) | ORAL | Status: DC | PRN

## 2024-03-12 MED ORDER — ALUM & MAG HYDROXIDE-SIMETH 200-200-20 MG/5ML PO SUSP
30.0000 mL | ORAL | Status: DC | PRN
  Administered 2024-03-13: 30 mL via ORAL
  Filled 2024-03-12: qty 30

## 2024-03-12 MED ORDER — ONDANSETRON 4 MG PO TBDP: 4.0000 mg | ORAL_TABLET | Freq: Four times a day (QID) | ORAL | Status: DC | PRN

## 2024-03-12 MED ORDER — HYDROXYZINE HCL 25 MG PO TABS
25.0000 mg | ORAL_TABLET | Freq: Four times a day (QID) | ORAL | Status: DC | PRN
  Administered 2024-03-12: 25 mg via ORAL
  Filled 2024-03-12 (×2): qty 1

## 2024-03-12 MED ORDER — ACETAMINOPHEN 325 MG PO TABS
650.0000 mg | ORAL_TABLET | Freq: Once | ORAL | Status: AC
  Administered 2024-03-12: 650 mg via ORAL
  Filled 2024-03-12: qty 2

## 2024-03-12 MED ORDER — LORAZEPAM 1 MG PO TABS
1.0000 mg | ORAL_TABLET | Freq: Four times a day (QID) | ORAL | Status: DC | PRN
  Administered 2024-03-12: 1 mg via ORAL
  Filled 2024-03-12: qty 1

## 2024-03-12 MED ORDER — HYDROXYZINE HCL 25 MG PO TABS
25.0000 mg | ORAL_TABLET | Freq: Four times a day (QID) | ORAL | Status: AC | PRN
  Administered 2024-03-13 – 2024-03-14 (×3): 25 mg via ORAL
  Filled 2024-03-12 (×2): qty 1

## 2024-03-12 MED ORDER — NAPROXEN 500 MG PO TABS
500.0000 mg | ORAL_TABLET | Freq: Two times a day (BID) | ORAL | Status: AC | PRN
  Administered 2024-03-14 – 2024-03-16 (×2): 500 mg via ORAL
  Filled 2024-03-12 (×2): qty 1

## 2024-03-12 MED ORDER — ADULT MULTIVITAMIN W/MINERALS CH
1.0000 | ORAL_TABLET | Freq: Every day | ORAL | Status: DC
  Administered 2024-03-13 – 2024-03-20 (×8): 1 via ORAL
  Filled 2024-03-12 (×8): qty 1

## 2024-03-12 MED ORDER — LOPERAMIDE HCL 2 MG PO CAPS: 2.0000 mg | ORAL_CAPSULE | ORAL | Status: AC | PRN

## 2024-03-12 MED ORDER — ADULT MULTIVITAMIN W/MINERALS CH
1.0000 | ORAL_TABLET | Freq: Every day | ORAL | Status: DC
  Administered 2024-03-12: 1 via ORAL
  Filled 2024-03-12: qty 1

## 2024-03-12 MED ORDER — OLANZAPINE 10 MG IM SOLR: 10.0000 mg | Freq: Three times a day (TID) | INTRAMUSCULAR | Status: DC | PRN

## 2024-03-12 MED ORDER — OLANZAPINE 5 MG PO TBDP
5.0000 mg | ORAL_TABLET | Freq: Three times a day (TID) | ORAL | Status: DC | PRN
  Administered 2024-03-14 – 2024-03-16 (×2): 5 mg via ORAL
  Filled 2024-03-12 (×2): qty 1

## 2024-03-12 MED ORDER — DICYCLOMINE HCL 20 MG PO TABS: 20.0000 mg | ORAL_TABLET | Freq: Four times a day (QID) | ORAL | Status: DC | PRN

## 2024-03-12 MED ORDER — ONDANSETRON 4 MG PO TBDP
4.0000 mg | ORAL_TABLET | Freq: Four times a day (QID) | ORAL | Status: DC | PRN
  Administered 2024-03-12: 4 mg via ORAL
  Filled 2024-03-12: qty 1

## 2024-03-12 MED ORDER — LOPERAMIDE HCL 2 MG PO CAPS: 2.0000 mg | ORAL_CAPSULE | ORAL | Status: DC | PRN

## 2024-03-12 MED ORDER — NICOTINE 21 MG/24HR TD PT24
21.0000 mg | MEDICATED_PATCH | Freq: Every day | TRANSDERMAL | Status: DC
  Filled 2024-03-12: qty 1

## 2024-03-12 MED ORDER — METHOCARBAMOL 500 MG PO TABS
500.0000 mg | ORAL_TABLET | Freq: Three times a day (TID) | ORAL | Status: AC | PRN
  Administered 2024-03-13 – 2024-03-14 (×2): 500 mg via ORAL
  Filled 2024-03-12 (×2): qty 1

## 2024-03-12 MED ORDER — OLANZAPINE 10 MG IM SOLR: 5.0000 mg | Freq: Three times a day (TID) | INTRAMUSCULAR | Status: DC | PRN

## 2024-03-12 MED ORDER — MAGNESIUM HYDROXIDE 400 MG/5ML PO SUSP: 30.0000 mL | Freq: Every day | ORAL | Status: DC | PRN

## 2024-03-12 MED ORDER — ACETAMINOPHEN 325 MG PO TABS
650.0000 mg | ORAL_TABLET | Freq: Four times a day (QID) | ORAL | Status: DC | PRN
  Administered 2024-03-12 – 2024-03-17 (×9): 650 mg via ORAL
  Filled 2024-03-12 (×9): qty 2

## 2024-03-12 MED ORDER — HYDROXYZINE HCL 25 MG PO TABS
25.0000 mg | ORAL_TABLET | Freq: Four times a day (QID) | ORAL | Status: AC | PRN
  Administered 2024-03-12 – 2024-03-16 (×5): 25 mg via ORAL
  Filled 2024-03-12 (×6): qty 1

## 2024-03-12 MED ORDER — METHOCARBAMOL 500 MG PO TABS: 500.0000 mg | ORAL_TABLET | Freq: Three times a day (TID) | ORAL | Status: DC | PRN

## 2024-03-12 MED ORDER — THIAMINE HCL 100 MG/ML IJ SOLN
100.0000 mg | Freq: Once | INTRAMUSCULAR | Status: AC
  Administered 2024-03-12: 100 mg via INTRAMUSCULAR
  Filled 2024-03-12: qty 2

## 2024-03-12 NOTE — Discharge Instructions (Signed)
[facility] 

## 2024-03-12 NOTE — ED Notes (Signed)
 Report was called to Crerra T at [facility]. Carlon Chester at [employer] aware of transportation needs.

## 2024-03-12 NOTE — Group Note (Signed)
 Date:  03/12/2024 Time:  6:07 PM  Group Topic/Focus:  Activity Group: The focus of this group is to promote activity for the patients and to encourage them to go outside to the courtyard and get some fresh air and some exercise.    Participation Level:  Active  Participation Quality:  Appropriate  Affect:  Appropriate  Cognitive:  Appropriate  Insight: Appropriate  Engagement in Group:  Engaged  Modes of Intervention:  Activity  Additional Comments:    Marianna Shirk Axle Parfait 03/12/2024, 6:07 PM

## 2024-03-12 NOTE — ED Notes (Signed)
 Patient A&O x 4, calm and cooperative. Patient denies HI, AVH. Patient reports passive SI but states he knows he will be safe at the facility. Patient c/o dizziness on turning of head and reflux. Patient observed belching and with minor tremors. Protonix  ordered per provider.

## 2024-03-12 NOTE — Group Note (Signed)
 Date:  03/12/2024 Time:  8:33 PM  Group Topic/Focus:  Wrap-Up Group:   The focus of this group is to help patients review their daily goal of treatment and discuss progress on daily workbooks.    Participation Level:  Active  Participation Quality:  Appropriate  Affect:  Appropriate  Cognitive:  Appropriate  Insight: Appropriate  Engagement in Group:  Engaged  Modes of Intervention:  Discussion  Additional Comments:     Fabiola Holy 03/12/2024, 8:33 PM

## 2024-03-12 NOTE — Progress Notes (Signed)
   03/12/24 1952  Psych Admission Type (Psych Patients Only)  Admission Status Voluntary  Psychosocial Assessment  Patient Complaints Anxiety;Depression  Eye Contact Fair  Facial Expression Anxious  Affect Anxious;Appropriate to circumstance  Speech Logical/coherent  Interaction Assertive  Motor Activity Slow  Appearance/Hygiene In scrubs  Behavior Characteristics Cooperative  Mood Anxious;Pleasant  Thought Process  Coherency WDL  Content WDL  Delusions None reported or observed  Perception WDL  Hallucination None reported or observed  Judgment WDL  Confusion None  Danger to Self  Current suicidal ideation? Denies  Self-Injurious Behavior No self-injurious ideation or behavior indicators observed or expressed   Agreement Not to Harm Self Yes  Description of Agreement Verbal  Danger to Others  Danger to Others None reported or observed   Pt scored an 11 on his CIWA and was given medication per protocol.

## 2024-03-12 NOTE — ED Notes (Signed)
   03/12/24 0024  CIWA-Ar  BP (!) 148/97  Pulse Rate 80  Nausea and Vomiting 1  Tactile Disturbances 0  Tremor 1  Auditory Disturbances 0  Paroxysmal Sweats 2  Visual Disturbances 0  Anxiety 2  Headache, Fullness in Head 3  Agitation 0  Orientation and Clouding of Sensorium 0  CIWA-Ar Total 9

## 2024-03-12 NOTE — Tx Team (Signed)
 Initial Treatment Plan 03/12/2024 3:19 PM YER OLIVENCIA [id number]    PATIENT STRESSORS: Loss of his aunt   Substance abuse     PATIENT STRENGTHS: Ability for insight  Education administrator  Motivation for treatment/growth  Supportive family/friends    PATIENT IDENTIFIED PROBLEMS: Substance Use  Depression  Anxiety  Hopelessness/Worthlessness               DISCHARGE CRITERIA:  Improved stabilization in mood, thinking, and/or behavior Motivation to continue treatment in a less acute level of care Safe-care adequate arrangements made Verbal commitment to aftercare and medication compliance Withdrawal symptoms are absent or subacute and managed without 24-hour nursing intervention  PRELIMINARY DISCHARGE PLAN: Attend 12-step recovery group Outpatient therapy Return to previous living arrangement  PATIENT/FAMILY INVOLVEMENT: This treatment plan has been presented to and reviewed with the patient, TAEGEN DELKER.  The patient has been given the opportunity to ask questions and make suggestions.  Chrystine Crate, RN 03/12/2024, 3:19 PM

## 2024-03-12 NOTE — ED Notes (Addendum)
 Patients CIWA 13 r/t c/o anxiousness, headache 4/10, mild nausea, observed tremors. Support meds administered. Patient's B/P elevated. Patient encouraged to consume water and rise slowly from a laying to sitting then standing position.

## 2024-03-12 NOTE — ED Notes (Signed)
 Pt in bed w/eyes closed resting quietly. Respirations equal and unlabored. No signs or symptoms of distress. Routine safety checks maintained/ongoing.

## 2024-03-12 NOTE — Progress Notes (Signed)
 Pt was accepted to [facility] TODAY 03/12/2024  Bed assignment: 324  Pt meets inpatient criteria per: Dorthea Gauze NP  Attending Physician will be Babette Lesches, DO  Report can be called to: [phone]  Pt can arrive after discharges   Care Team Notified:  Kathryn Parish RN, Cydney Draft NP, Ardie Kras NP   [place] Kanai Hilger LCSW-A   03/12/2024 10:22 AM

## 2024-03-12 NOTE — Group Note (Signed)
 Recreation Therapy Group Note   Group Topic:Other  Group Date: 03/12/2024 Start Time: 1500 End Time: 1600 Facilitators: Deatrice Factor, LRT, CTRS Location: Courtyard  Group Description: [employer]. LRT and patients played games of basketball, drew with chalk, and played corn hole while outside in the courtyard while getting fresh air and sunlight. Music was being played in the background. LRT and peers conversed about different games they have played before, what they do in their free time and anything else that is on their minds. LRT encouraged pts to drink water after being outside, sweating and getting their heart rate up.  Goal Area(s) Addressed: Patient will build on frustration tolerance skills. Patients will partake in a competitive play game with peers. Patients will gain knowledge of new leisure interest/hobby.    Affect/Mood: Appropriate   Participation Level: Active   Participation Quality: Independent   Behavior: Appropriate   Speech/Thought Process: Coherent   Insight: Fair   Judgement: Fair    Modes of Intervention: Activity   Patient Response to Interventions:  Receptive   Education Outcome:  Acknowledges education   Clinical Observations/Individualized Feedback: Brett Sanford was active in their participation of session activities and group discussion. Pt interacted well with LRT and peers duration of session.    Plan: Continue to engage patient in RT group sessions 2-3x/week.   Deatrice Factor, LRT, CTRS 03/12/2024 4:39 PM

## 2024-03-12 NOTE — ED Provider Notes (Signed)
 FBC/OBS ASAP Discharge Summary  Date and Time: 03/12/2024 10:15 AM  Name: Brett Sanford  MRN:  [id number]   Discharge Diagnoses:  Final diagnoses:  Recurrent major depressive disorder, remission status unspecified (HCC)  Ineffective coping    Subjective: "I'm alright"  Stay Summary:  Pt is a 33 y/o male seen during rounding today. Pt presented to [facility] voluntarily for alcohol use disorder. He stated he had been sober since October 2024, however he relapsed about a month ago after his aunty passed. He also reports a history of depression and passive thoughts of self-harm. He reported that he was banging is head prior to his assessment yesterday, head noted with a bandage, no -drainage observed. He reports a headache, 3/10 on pain scale. He also reports withdrawals symptoms of nausea and B/L hand tremors. He reports trialing Wellbutrin, Effexor  and Prozac  for depression. He is currently not taking any medications or following any outpatient psychiatric provider. He is amendable to inpatient hospitalization for detox and medication  management for his depression. He expressed that he does not want to go to a 30 day program. He stated that engaging with support groups had helped his sobriety. He will benefit form engaging in groups will inpatient.   Total Time spent with patient: 30 minutes  Past Psychiatric History: SI, MDD, Alcohol use d/o, polysubstance use  Past Medical History: Chronically elevated LFTs, GERD Family History: None reported Family Psychiatric History:  None reported Social History: Alcohol, meth, cocaine, THC Tobacco Cessation:  A prescription for an FDA-approved tobacco cessation medication provided at discharge  Current Medications:  Current Facility-Administered Medications  Medication Dose Route Frequency Provider Last Rate Last Admin   acetaminophen  (TYLENOL ) tablet 650 mg  650 mg Oral Q6H PRN Dorthea Gauze, NP   650 mg at 03/12/24 0615   alum & mag hydroxide-simeth  (MAALOX/MYLANTA) 200-200-20 MG/5ML suspension 30 mL  30 mL Oral Q4H PRN Dorthea Gauze, NP       dicyclomine  (BENTYL ) tablet 20 mg  20 mg Oral Q6H PRN Dorthea Gauze, NP       hydrOXYzine  (ATARAX ) tablet 25 mg  25 mg Oral Q6H PRN Dorthea Gauze, NP       hydrOXYzine  (ATARAX ) tablet 25 mg  25 mg Oral Q6H PRN Dorthea Gauze, NP   25 mg at 03/12/24 0615   loperamide  (IMODIUM ) capsule 2-4 mg  2-4 mg Oral PRN Dorthea Gauze, NP       LORazepam  (ATIVAN ) tablet 1 mg  1 mg Oral Q6H PRN Dorthea Gauze, NP       magnesium  hydroxide (MILK OF MAGNESIA) suspension 30 mL  30 mL Oral Daily PRN Dorthea Gauze, NP       methocarbamol (ROBAXIN) tablet 500 mg  500 mg Oral Q8H PRN Dorthea Gauze, NP       multivitamin with minerals tablet 1 tablet  1 tablet Oral Daily Dorthea Gauze, NP   1 tablet at 03/12/24 1191   naproxen  (NAPROSYN ) tablet 500 mg  500 mg Oral BID PRN Dorthea Gauze, NP       OLANZapine (ZYPREXA) injection 10 mg  10 mg Intramuscular TID PRN Dorthea Gauze, NP       OLANZapine (ZYPREXA) injection 5 mg  5 mg Intramuscular TID PRN Dorthea Gauze, NP       OLANZapine zydis (ZYPREXA) disintegrating tablet 5 mg  5 mg Oral TID PRN Dorthea Gauze, NP       ondansetron  (ZOFRAN -ODT) disintegrating tablet 4 mg  4 mg Oral Q6H PRN Broadus Canes,  Joanette Moynahan, NP       Cecily Cohen ON 03/13/2024] thiamine  (VITAMIN B1) tablet 100 mg  100 mg Oral Daily Dorthea Gauze, NP       traZODone  (DESYREL ) tablet 50 mg  50 mg Oral QHS PRN Dorthea Gauze, NP   50 mg at 03/12/24 0001   Current Outpatient Medications  Medication Sig Dispense Refill   acetaminophen  (TYLENOL ) 500 MG tablet Take 1,000 mg by mouth every 6 (six) hours as needed for headache.     omeprazole (PRILOSEC OTC) 20 MG tablet Take 20 mg by mouth daily.     ondansetron  (ZOFRAN -ODT) 4 MG disintegrating tablet Take 1 tablet (4 mg total) by mouth every 8 (eight) hours as needed for nausea or vomiting. 20 tablet 0   traZODone  (DESYREL ) 50 MG tablet Take 1 tablet (50 mg total) by mouth at bedtime  as needed for sleep. 30 tablet 0    PTA Medications:  Facility Ordered Medications  Medication   acetaminophen  (TYLENOL ) tablet 650 mg   alum & mag hydroxide-simeth (MAALOX/MYLANTA) 200-200-20 MG/5ML suspension 30 mL   magnesium  hydroxide (MILK OF MAGNESIA) suspension 30 mL   OLANZapine zydis (ZYPREXA) disintegrating tablet 5 mg   OLANZapine (ZYPREXA) injection 5 mg   OLANZapine (ZYPREXA) injection 10 mg   traZODone  (DESYREL ) tablet 50 mg   dicyclomine  (BENTYL ) tablet 20 mg   hydrOXYzine  (ATARAX ) tablet 25 mg   methocarbamol (ROBAXIN) tablet 500 mg   naproxen  (NAPROSYN ) tablet 500 mg   ondansetron  (ZOFRAN -ODT) disintegrating tablet 4 mg   [COMPLETED] thiamine  (VITAMIN B1) injection 100 mg   [START ON 03/13/2024] thiamine  (VITAMIN B1) tablet 100 mg   multivitamin with minerals tablet 1 tablet   LORazepam  (ATIVAN ) tablet 1 mg   hydrOXYzine  (ATARAX ) tablet 25 mg   loperamide  (IMODIUM ) capsule 2-4 mg   [COMPLETED] acetaminophen  (TYLENOL ) tablet 650 mg   PTA Medications  Medication Sig   omeprazole (PRILOSEC OTC) 20 MG tablet Take 20 mg by mouth daily.   traZODone  (DESYREL ) 50 MG tablet Take 1 tablet (50 mg total) by mouth at bedtime as needed for sleep.   ondansetron  (ZOFRAN -ODT) 4 MG disintegrating tablet Take 1 tablet (4 mg total) by mouth every 8 (eight) hours as needed for nausea or vomiting.   acetaminophen  (TYLENOL ) 500 MG tablet Take 1,000 mg by mouth every 6 (six) hours as needed for headache.       06/18/2019    1:27 PM  Depression screen PHQ 2/9  Decreased Interest 1  Down, Depressed, Hopeless 1  PHQ - 2 Score 2  Altered sleeping 1  Tired, decreased energy 1  Change in appetite 2  Feeling bad or failure about yourself  1  Trouble concentrating 0  Moving slowly or fidgety/restless 2  Suicidal thoughts 1  PHQ-9 Score 10  Difficult doing work/chores Somewhat difficult    Flowsheet Row ED from 03/11/2024 in [facility] UC from 02/28/2024 in  [facility] UC from 02/25/2024 in [facility])  C-SSRS RISK CATEGORY High Risk No Risk No Risk       Musculoskeletal  Strength & Muscle Tone: within normal limits Gait & Station: normal Patient leans: Front  Psychiatric Specialty Exam  Presentation  General Appearance:  Casual  Eye Contact: Fair  Speech: Clear and Coherent  Speech Volume: Normal  Handedness: Right   Mood and Affect  Mood: Anxious; Angry; Depressed; Hopeless  Affect: Blunt   Thought Process  Thought Processes: Coherent  Descriptions of Associations:Intact  Orientation:Full (Time, Place and Person)  Thought Content:WDL  Diagnosis of Schizophrenia or Schizoaffective disorder in past: No    Hallucinations:Hallucinations: None  Ideas of Reference:None  Suicidal Thoughts:Suicidal Thoughts: Yes, Passive SI Passive Intent and/or Plan: With Intent; Without Plan  Homicidal Thoughts:Homicidal Thoughts: No   Sensorium  Memory: Immediate Fair  Judgment: Poor  Insight: Lacking   Executive Functions  Concentration: Good  Attention Span: Good  Recall: Good  Fund of Knowledge: Good  Language: Good   Psychomotor Activity  Psychomotor Activity: Psychomotor Activity: Normal   Assets  Assets: Desire for Improvement; Resilience; Social Support   Sleep  Sleep: Sleep: Fair Number of Hours of Sleep: 6   Nutritional Assessment (For OBS and FBC admissions only) Has the patient had a weight loss or gain of 10 pounds or more in the last 3 months?: No Has the patient had a decrease in food intake/or appetite?: No Does the patient have dental problems?: No Does the patient have eating habits or behaviors that may be indicators of an eating disorder including binging or inducing vomiting?: No Has the patient recently lost weight without trying?: 0 Has the patient been eating poorly  because of a decreased appetite?: 0 Malnutrition Screening Tool Score: 0    Physical Exam  Physical Exam Vitals reviewed.  Constitutional:      General: He is not in acute distress.    Appearance: He is ill-appearing. He is not toxic-appearing.  HENT:     Head: Normocephalic.  Cardiovascular:     Rate and Rhythm: Normal rate.  Pulmonary:     Effort: Pulmonary effort is normal.     Breath sounds: Normal breath sounds.  Musculoskeletal:     Cervical back: Normal range of motion.  Skin:    General: Skin is dry.  Neurological:     General: No focal deficit present.     Mental Status: He is alert.    Review of Systems  Psychiatric/Behavioral:  Positive for depression, substance abuse and suicidal ideas. The patient does not have insomnia.    Blood pressure (!) 142/87, pulse 90, temperature 97.6 F (36.4 C), temperature source Oral, resp. rate 17, SpO2 100%. There is no height or weight on file to calculate BMI.  Demographic Factors:  Male and Caucasian  Loss Factors: Loss of significant relationship  Historical Factors: Prior suicide attempts and Impulsivity  Risk Reduction Factors:   Positive therapeutic relationship  Continued Clinical Symptoms:  Depression:   Hopelessness Impulsivity Alcohol/Substance Abuse/Dependencies  Cognitive Features That Contribute To Risk:  None    Suicide Risk:  Severe:  Frequent, intense, and enduring suicidal ideation, specific plan, no subjective intent, but some objective markers of intent (i.e., choice of lethal method), the method is accessible, some limited preparatory behavior, evidence of impaired self-control, severe dysphoria/symptomatology, multiple risk factors present, and few if any protective factors, particularly a lack of social support.  Plan Of Care/Follow-up recommendations:  Other:  Pt meets inpatient criteria. He has been accepted to [facility]. Pt is voluntary and he would be transported via safe transport.    Disposition: Inpatient at [facility]  Tosin Lindia Garms, NP 03/12/2024, 10:15 AM

## 2024-03-12 NOTE — Plan of Care (Signed)
  Problem: Education: Goal: Emotional status will improve Outcome: Progressing Goal: Mental status will improve Outcome: Progressing Goal: Verbalization of understanding the information provided will improve Outcome: Progressing   Problem: Activity: Goal: Interest or engagement in activities will improve Outcome: Progressing   Problem: Coping: Goal: Ability to verbalize frustrations and anger appropriately will improve Outcome: Progressing Goal: Ability to demonstrate self-control will improve Outcome: Progressing   Problem: Health Behavior/Discharge Planning: Goal: Compliance with treatment plan for underlying cause of condition will improve Outcome: Progressing   Problem: Safety: Goal: Periods of time without injury will increase Outcome: Progressing

## 2024-03-12 NOTE — ED Notes (Addendum)
 Pt called this nurse over  and stated "I am going through withdrawals; I've been through this before so I know when I haven't had a drink this is what happens."  03/12/24 0024  Vital Signs  Pulse Rate 80  Pulse Rate Source Dinamap  BP (!) 148/97  BP Location Left Arm  BP Method Automatic  Patient Position (if appropriate) Sitting  Pain Assessment  Pain Score 3  Pain Type Acute pain  Pain Location Head  Pain Orientation Anterior  Pain Descriptors / Indicators Discomfort  Pain Frequency Intermittent  CIWA-Ar  Nausea and Vomiting 1  Tactile Disturbances 0  Tremor 1  Auditory Disturbances 0  Paroxysmal Sweats 2  Visual Disturbances 0  Anxiety 2  Headache, Fullness in Head 3  Agitation 0  Orientation and Clouding of Sensorium 0  CIWA-Ar Total 9

## 2024-03-12 NOTE — Progress Notes (Signed)
 Patient arrived on the unit voluntarily at 42 from [facility]. Patient currently denies SI/HI/AVH but reports a headache and rated the pain a 4 out of 10. The patient's skin was assessed and was found to have exceptions to the normal daily limits. The patient has an abrasion and redness in the middle of his forehead, caused by banging his head on the wall at the previous facility. The patient also has scattered abrasions and scars on his right arm, and states they were caused by burning his arm at work. There are no open wounds to report at this time. The patient is now oriented to the unit and they unit rules.    03/12/24 1427  Psych Admission Type (Psych Patients Only)  Admission Status Voluntary  Psychosocial Assessment  Patient Complaints Hopelessness;Worthlessness  Eye Contact Fair  Facial Expression Anxious  Affect Anxious;Appropriate to circumstance  Speech Logical/coherent  Interaction Assertive  Motor Activity Tremors  Appearance/Hygiene Unremarkable  Behavior Characteristics Cooperative  Mood Helpless;Worthless, low self-esteem;Pleasant  Aggressive Behavior  Targets Self (Patient presented with a history of self injurious behavior from the previous unit. Patient is not currently displying this behavior)  Effect Self-harm  Thought Process  Coherency WDL  Content WDL  Delusions None reported or observed  Perception WDL  Hallucination None reported or observed  Judgment Poor (improving)  Confusion None  Danger to Self  Current suicidal ideation? Denies  Self-Injurious Behavior No self-injurious ideation or behavior indicators observed or expressed   Agreement Not to Harm Self Yes  Description of Agreement verbal  Danger to Others  Danger to Others None reported or observed

## 2024-03-12 NOTE — ED Notes (Signed)
 Lenon Radar at [facility] transport made aware of need for transport to [facility].

## 2024-03-13 MED ORDER — CHLORDIAZEPOXIDE HCL 25 MG PO CAPS
25.0000 mg | ORAL_CAPSULE | Freq: Once | ORAL | Status: AC
  Administered 2024-03-13: 25 mg via ORAL
  Filled 2024-03-13: qty 1

## 2024-03-13 MED ORDER — CHLORDIAZEPOXIDE HCL 25 MG PO CAPS
25.0000 mg | ORAL_CAPSULE | ORAL | Status: AC
  Administered 2024-03-15 – 2024-03-16 (×2): 25 mg via ORAL
  Filled 2024-03-13 (×2): qty 1

## 2024-03-13 MED ORDER — CHLORDIAZEPOXIDE HCL 25 MG PO CAPS
25.0000 mg | ORAL_CAPSULE | Freq: Four times a day (QID) | ORAL | Status: AC
  Administered 2024-03-13 – 2024-03-14 (×4): 25 mg via ORAL
  Filled 2024-03-13 (×4): qty 1

## 2024-03-13 MED ORDER — CHLORDIAZEPOXIDE HCL 25 MG PO CAPS
25.0000 mg | ORAL_CAPSULE | Freq: Three times a day (TID) | ORAL | Status: AC
Start: 2024-03-14 — End: 2024-03-15
  Administered 2024-03-14 – 2024-03-15 (×3): 25 mg via ORAL
  Filled 2024-03-13 (×3): qty 1

## 2024-03-13 MED ORDER — CHLORDIAZEPOXIDE HCL 25 MG PO CAPS
25.0000 mg | ORAL_CAPSULE | Freq: Every day | ORAL | Status: AC
Start: 2024-03-17 — End: 2024-03-17
  Administered 2024-03-17: 25 mg via ORAL
  Filled 2024-03-13: qty 1

## 2024-03-13 NOTE — Plan of Care (Signed)
   Problem: Coping: Goal: Ability to verbalize frustrations and anger appropriately will improve Outcome: Progressing Goal: Ability to demonstrate self-control will improve Outcome: Progressing

## 2024-03-13 NOTE — Group Note (Signed)
 Date:  03/13/2024 Time:  5:49 PM  Group Topic/Focus:  Wellness Toolbox:   The focus of this group is to discuss various aspects of wellness, balancing those aspects and exploring ways to increase the ability to experience wellness.  Patients will create a wellness toolbox for use upon discharge.    Participation Level:  Active  Participation Quality:  Appropriate  Affect:  Appropriate  Cognitive:  Appropriate  Insight: Appropriate  Engagement in Group:  Engaged  Modes of Intervention:  Activity and Socialization  Additional Comments:    Brett Sanford 03/13/2024, 5:49 PM

## 2024-03-13 NOTE — [facility] Suicide Risk Assessment (Signed)
 [facility] INPATIENT:  Family/Significant Other Suicide Prevention Education  Suicide Prevention Education:  Patient Refusal for Family/Significant Other Suicide Prevention Education: The patient Brett Sanford has refused to provide written consent for family/significant other to be provided Family/Significant Other Suicide Prevention Education during admission and/or prior to discharge.  Physician notified.  SPE completed with pt, as pt refused to consent to family contact. SPI pamphlet provided to pt and pt was encouraged to share information with support network, ask questions, and talk about any concerns relating to SPE. Pt denies access to guns/firearms and verbalized understanding of information provided. Mobile Crisis information also provided to pt.  Randolm Butte 03/13/2024, 2:54 PM

## 2024-03-13 NOTE — Progress Notes (Signed)
 Pt's blood pressure continues to be elevated and is now 149/116. Provider on call notified via secure chat and awaiting response.

## 2024-03-13 NOTE — Group Note (Signed)
 [facility] LCSW Group Therapy Note   Group Date: 03/13/2024 Start Time: 1300 End Time: 1400   Type of Therapy and Topic: Group Therapy: Avoiding Self-Sabotaging and Enabling Behaviors  Participation Level: Did Not Attend  Mood:  Description of Group:  In this group, patients will learn how to identify obstacles, self-sabotaging and enabling behaviors, as well as: what are they, why do we do them and what needs these behaviors meet. Discuss unhealthy relationships and how to have positive healthy boundaries with those that sabotage and enable. Explore aspects of self-sabotage and enabling in yourself and how to limit these self-destructive behaviors in everyday life.   Therapeutic Goals: 1. Patient will identify one obstacle that relates to self-sabotage and enabling behaviors 2. Patient will identify one personal self-sabotaging or enabling behavior they did prior to admission 3. Patient will state a plan to change the above identified behavior 4. Patient will demonstrate ability to communicate their needs through discussion and/or role play.    Summary of Patient Progress:   Patient did not attend group.    Therapeutic Modalities:  Cognitive Behavioral Therapy Person-Centered Therapy Motivational Interviewing    Roselle Conner, LCSW

## 2024-03-13 NOTE — [facility] Counselor (Signed)
 Adult Comprehensive Assessment  Patient ID: Brett Sanford, male   DOB: 02-Dec-1990, 33 y.o.   MRN: [id number]  Information Source: Information source: Patient  Current Stressors:  Patient states their primary concerns and needs for treatment are:: Pt shared that his aunt died a month ago and he was unable to afford outpatient services due to lack of insurance which led to a relapse. Patient states their goals for this hospitilization and ongoing recovery are:: "To get stablized, detox, before participating in outpatient treatment." Educational / Learning stressors: He endorsed plans to try to go back to school. Employment / Job issues: None reported Family Relationships: Pt reported deficits in communication with is mother at times. Financial / Lack of resources (include bankruptcy): None reported Housing / Lack of housing: Pt lives with his mother. Physical health (include injuries & life threatening diseases): None reported Social relationships: None reported Substance abuse: He reported relapse on alcohol for the past month. Bereavement / Loss: Loss of his aunt approximately a month ago.  Living/Environment/Situation:  Living Arrangements: Parent Living conditions (as described by patient or guardian): He described his home as strict but supportive environment. Who else lives in the home?: Pt's mother. How long has patient lived in current situation?: "Since COVID, 2020." What is atmosphere in current home: Supportive  Family History:  Marital status: Single Are you sexually active?: Yes What is your sexual orientation?: "Straight." Has your sexual activity been affected by drugs, alcohol, medication, or emotional stress?: None reported Does patient have children?: No  Childhood History:  By whom was/is the patient raised?: Both parents, Mother Additional childhood history information: Pt was raised by both parents until his father passed away when pt was 65 years old due to  brain cancer. He described his father as a loving parent but shared that his mother was more strict. Pt stated that mother his mother was strict due to being overprotective especially after his father died. Description of patient's relationship with caregiver when they were a child: Pt described his father as a loving parent. He shared that after his father died his mother became strict due to being overprotective. Patient's description of current relationship with people who raised him/her: "It's fine." How were you disciplined when you got in trouble as a child/adolescent?: Pt reported getting timeout, grounded, and occasionally spanking. Does patient have siblings?: Yes Number of Siblings: 2 Description of patient's current relationship with siblings: Pt reported a close relationship with his sister whom he speaks to often Did patient suffer any verbal/emotional/physical/sexual abuse as a child?: No Did patient suffer from severe childhood neglect?: No Has patient ever been sexually abused/assaulted/raped as an adolescent or adult?: No Was the patient ever a victim of a crime or a disaster?: No Witnessed domestic violence?: No Has patient been affected by domestic violence as an adult?: No  Education:  Highest grade of school patient has completed: High school graduate with some college. Currently a student?: No Learning disability?: Yes What learning problems does patient have?: Pt reported that he has ADHD.  Employment/Work Situation:   Employment Situation: Employed Where is Patient Currently Employed?: [employer] How Long has Patient Been Employed?: 3 months Are You Satisfied With Your Job?: Yes ("It's Midwife, it's a job.") Do You Work More Than One Job?: No Patient's Job has Been Impacted by Current Illness: No What is the Longest Time Patient has Held a Job?: 5 years Where was the Patient Employed at that Time?: [employer] Has Patient ever Been in the  Military?: No  Financial  Resources:   Financial resources: Income from employment, Medicaid Does patient have a representative payee or guardian?: No  Alcohol/Substance Abuse:   What has been your use of drugs/alcohol within the last 12 months?: Pt reported drinking a pint to a fifth daily, everyday for the past month. If attempted suicide, did drugs/alcohol play a role in this?: Yes Alcohol/Substance Abuse Treatment Hx: Past Tx, Inpatient, Past Tx, Outpatient, Past detox If yes, describe treatment: [facility], [facility] (did not complete), and outpatient at [facility]. Has alcohol/substance abuse ever caused legal problems?: No  Social Support System:   Patient's Community Support System: [employer] Museum/gallery exhibitions officer System: He reported a good relationship with his sister and that both his sister and mother are supportive. Type of faith/religion: "Christian." How does patient's faith help to cope with current illness?: Pt reported going out in nature to talk to God.  Leisure/Recreation:   Do You Have Hobbies?: Yes Leisure and Hobbies: "Being outdoors: hiking, fishing, hunting. "Listening to music, read, talk to friends, shoot pool sometimes."  Strengths/Needs:   What is the patient's perception of their strengths?: "Good cook, smart, funny, not an asshole." Patient states they can use these personal strengths during their treatment to contribute to their recovery: Pt reported that he can focus on these positive strengths to reduce negative thoughts. Patient states these barriers may affect/interfere with their treatment: Pt denied any barriers Patient states these barriers may affect their return to the community: Pt denied any barriers  Discharge Plan:   Currently receiving community mental health services: No Patient states concerns and preferences for aftercare planning are: Pt would like to be connected with provider for outpatient mental health services. He shared that he would follow up with [facility] in  [place]. Patient states they will know when they are safe and ready for discharge when: "When I'm not thinking, making bad decisions, when I'm just a bit more stable and the hopelessness is better." Does patient have access to transportation?: Yes Does patient have financial barriers related to discharge medications?: No Will patient be returning to same living situation after discharge?: Yes  Summary/Recommendations:   Summary and Recommendations (to be completed by the evaluator): Patient is a 33 year old, single, male from [place], [place] [facility]). Pt reported that he came into the hospital after relapsing a month ago, secondary to the loss of his aunt and lack of outpatient follow up due to lack of insurance. He expressed the desire to get detoxed and stabilized before returning to outpatient mental health services. Pt reported that he had been drinking a pint to a fifth of alcohol daily for the past month. Pt denied any history of abuse or trauma. Upon discharge, pt plans to return home and would like to be connected with outpatient follow up. Pt open to going to walk-in hours through [facility] in [place], [place]. Recommendations include: crisis stabilization, therapeutic milieu, encourage group attendance and participation, medication management for detox/mood stabilization and development of a comprehensive mental/sobriety wellness plan.  Randolm Butte. 03/13/2024

## 2024-03-13 NOTE — Plan of Care (Signed)
   Problem: Education: Goal: Emotional status will improve Outcome: Progressing Goal: Mental status will improve Outcome: Progressing Goal: Verbalization of understanding the information provided will improve Outcome: Progressing

## 2024-03-13 NOTE — BH IP Treatment Plan (Signed)
 Interdisciplinary Treatment and Diagnostic Plan Update  03/13/2024 Time of Session: 10:16 Brett Sanford MRN: [id number]  Principal Diagnosis: Alcohol use disorder, severe, dependence (HCC)  Secondary Diagnoses: Principal Problem:   Alcohol use disorder, severe, dependence (HCC)   Current Medications:  Current Facility-Administered Medications  Medication Dose Route Frequency Provider Last Rate Last Admin   acetaminophen  (TYLENOL ) tablet 650 mg  650 mg Oral Q6H PRN Olasunkanmi, Oluwatosin, NP   650 mg at 03/13/24 0839   alum & mag hydroxide-simeth (MAALOX/MYLANTA) 200-200-20 MG/5ML suspension 30 mL  30 mL Oral Q4H PRN Olasunkanmi, Oluwatosin, NP       chlordiazePOXIDE (LIBRIUM) capsule 25 mg  25 mg Oral Once Angelia Barcelona, NP       chlordiazePOXIDE (LIBRIUM) capsule 25 mg  25 mg Oral QID Angelia Barcelona, NP       Followed by   Cecily Cohen ON 03/14/2024] chlordiazePOXIDE (LIBRIUM) capsule 25 mg  25 mg Oral TID Angelia Barcelona, NP       Followed by   Cecily Cohen ON 03/15/2024] chlordiazePOXIDE (LIBRIUM) capsule 25 mg  25 mg Oral Basilia Lima, NP       Followed by   Cecily Cohen ON 03/17/2024] chlordiazePOXIDE (LIBRIUM) capsule 25 mg  25 mg Oral Daily Angelia Barcelona, NP       dicyclomine  (BENTYL ) tablet 20 mg  20 mg Oral Q6H PRN Olasunkanmi, Oluwatosin, NP       hydrOXYzine  (ATARAX ) tablet 25 mg  25 mg Oral Q6H PRN Olasunkanmi, Oluwatosin, NP   25 mg at 03/13/24 4098   hydrOXYzine  (ATARAX ) tablet 25 mg  25 mg Oral Q6H PRN Olasunkanmi, Oluwatosin, NP       loperamide  (IMODIUM ) capsule 2-4 mg  2-4 mg Oral PRN Olasunkanmi, Oluwatosin, NP       LORazepam  (ATIVAN ) tablet 1 mg  1 mg Oral Q6H PRN Olasunkanmi, Oluwatosin, NP   1 mg at 03/12/24 2219   magnesium  hydroxide (MILK OF MAGNESIA) suspension 30 mL  30 mL Oral Daily PRN Olasunkanmi, Oluwatosin, NP       methocarbamol (ROBAXIN) tablet 500 mg  500 mg Oral Q8H PRN Olasunkanmi, Oluwatosin, NP       multivitamin with minerals tablet 1  tablet  1 tablet Oral Daily Olasunkanmi, Oluwatosin, NP   1 tablet at 03/13/24 0840   naproxen  (NAPROSYN ) tablet 500 mg  500 mg Oral BID PRN Olasunkanmi, Oluwatosin, NP       nicotine  (NICODERM CQ  - dosed in mg/24 hours) patch 21 mg  21 mg Transdermal Q0600 Olasunkanmi, Oluwatosin, NP       OLANZapine (ZYPREXA) injection 10 mg  10 mg Intramuscular TID PRN Olasunkanmi, Oluwatosin, NP       OLANZapine (ZYPREXA) injection 5 mg  5 mg Intramuscular TID PRN Olasunkanmi, Oluwatosin, NP       OLANZapine zydis (ZYPREXA) disintegrating tablet 5 mg  5 mg Oral TID PRN Olasunkanmi, Oluwatosin, NP       ondansetron  (ZOFRAN -ODT) disintegrating tablet 4 mg  4 mg Oral Q6H PRN Olasunkanmi, Oluwatosin, NP       pantoprazole  (PROTONIX ) EC tablet 40 mg  40 mg Oral Daily Olasunkanmi, Oluwatosin, NP   40 mg at 03/13/24 1191   thiamine  (VITAMIN B1) tablet 100 mg  100 mg Oral Daily Olasunkanmi, Oluwatosin, NP   100 mg at 03/13/24 4782   PTA Medications: Medications Prior to Admission  Medication Sig Dispense Refill Last Dose/Taking   acetaminophen  (TYLENOL ) 500 MG tablet Take 1,000 mg by mouth every  6 (six) hours as needed for headache.      omeprazole (PRILOSEC OTC) 20 MG tablet Take 20 mg by mouth daily.      ondansetron  (ZOFRAN -ODT) 4 MG disintegrating tablet Take 1 tablet (4 mg total) by mouth every 8 (eight) hours as needed for nausea or vomiting. 20 tablet 0    traZODone  (DESYREL ) 50 MG tablet Take 1 tablet (50 mg total) by mouth at bedtime as needed for sleep. 30 tablet 0     Patient Stressors: Loss of his aunt   Substance abuse    Patient Strengths: Ability for insight  Education administrator  Motivation for treatment/growth  Supportive family/friends   Treatment Modalities: Medication Management, Group therapy, Case management,  1 to 1 session with clinician, Psychoeducation, Recreational therapy.   Physician Treatment Plan for Primary Diagnosis: Alcohol use disorder, severe,  dependence (HCC) Long Term Goal(s):     Short Term Goals:    Medication Management: Evaluate patient's response, side effects, and tolerance of medication regimen.  Therapeutic Interventions: 1 to 1 sessions, Unit Group sessions and Medication administration.  Evaluation of Outcomes: Not Met  Physician Treatment Plan for Secondary Diagnosis: Principal Problem:   Alcohol use disorder, severe, dependence (HCC)  Long Term Goal(s):     Short Term Goals:       Medication Management: Evaluate patient's response, side effects, and tolerance of medication regimen.  Therapeutic Interventions: 1 to 1 sessions, Unit Group sessions and Medication administration.  Evaluation of Outcomes: Not Met   RN Treatment Plan for Primary Diagnosis: Alcohol use disorder, severe, dependence (HCC) Long Term Goal(s): Knowledge of disease and therapeutic regimen to maintain health will improve  Short Term Goals: Ability to remain free from injury will improve, Ability to verbalize frustration and anger appropriately will improve, Ability to demonstrate self-control, Ability to participate in decision making will improve, Ability to verbalize feelings will improve, Ability to disclose and discuss suicidal ideas, Ability to identify and develop effective coping behaviors will improve, and Compliance with prescribed medications will improve  Medication Management: RN will administer medications as ordered by provider, will assess and evaluate patient's response and provide education to patient for prescribed medication. RN will report any adverse and/or side effects to prescribing provider.  Therapeutic Interventions: 1 on 1 counseling sessions, Psychoeducation, Medication administration, Evaluate responses to treatment, Monitor vital signs and CBGs as ordered, Perform/monitor CIWA, COWS, AIMS and Fall Risk screenings as ordered, Perform wound care treatments as ordered.  Evaluation of Outcomes: Not Met   LCSW  Treatment Plan for Primary Diagnosis: Alcohol use disorder, severe, dependence (HCC) Long Term Goal(s): Safe transition to appropriate next level of care at discharge, Engage patient in therapeutic group addressing interpersonal concerns.  Short Term Goals: Engage patient in aftercare planning with referrals and resources, Increase social support, Increase ability to appropriately verbalize feelings, Increase emotional regulation, Facilitate acceptance of mental health diagnosis and concerns, Facilitate patient progression through stages of change regarding substance use diagnoses and concerns, Identify triggers associated with mental health/substance abuse issues, and Increase skills for wellness and recovery  Therapeutic Interventions: Assess for all discharge needs, 1 to 1 time with Social worker, Explore available resources and support systems, Assess for adequacy in community support network, Educate family and significant other(s) on suicide prevention, Complete Psychosocial Assessment, Interpersonal group therapy.  Evaluation of Outcomes: Not Met   Progress in Treatment: Attending groups: Yes. Participating in groups: Yes. Taking medication as prescribed: Yes. Toleration medication: Yes. Family/Significant other contact made:  No, will contact:  if given permission. Patient understands diagnosis: Yes. Discussing patient identified problems/goals with staff: Yes. Medical problems stabilized or resolved: Yes. Denies suicidal/homicidal ideation: Yes. Issues/concerns per patient self-inventory: No. Other: none.  New problem(s) identified: No, Describe:  none identified.   New Short Term/Long Term Goal(s): detox, medication management for mood stabilization; elimination of SI thoughts; development of comprehensive mental wellness/sobriety plan.   Patient Goals:  "To get stable before returning to outpatient."   Discharge Plan or Barriers: CSW will assist pt with development of an  appropriate aftercare/discharge plan.   Reason for Continuation of Hospitalization: Depression Medication stabilization Suicidal ideation Withdrawal symptoms  Estimated Length of Stay: 1-7 days  Last 3 [place] Suicide Severity Risk Score: Flowsheet Row Admission (Current) from 03/12/2024 in [facility] ED from 03/11/2024 [facility] 02/28/2024 in [facility]  C-SSRS RISK CATEGORY High Risk High Risk No Risk       Last PHQ 2/9 Scores:    06/18/2019    1:27 PM  Depression screen PHQ 2/9  Decreased Interest 1  Down, Depressed, Hopeless 1  PHQ - 2 Score 2  Altered sleeping 1  Tired, decreased energy 1  Change in appetite 2  Feeling bad or failure about yourself  1  Trouble concentrating 0  Moving slowly or fidgety/restless 2  Suicidal thoughts 1  PHQ-9 Score 10  Difficult doing work/chores Somewhat difficult    Scribe for Treatment Team: Randolm Butte, LCSW 03/13/2024 2:00 PM

## 2024-03-13 NOTE — [facility] Group Notes (Signed)
 [facility] Group Notes:  (Nursing/MHT/Case Management/Adjunct)  Date:  03/13/2024  Time:  2000  Type of Therapy:  Wrap up group  Participation Level:  Active  Participation Quality:  Appropriate, Inattentive, Redirectable, Sharing, and Supportive  Affect:  Excited  Cognitive:  Alert  Insight:  Improving  Engagement in Group:  Distracting and Supportive  Modes of Intervention:  Clarification, Education, and Support  Summary of Progress/Problems: Positive thinking and positive change were discussed.   Catharine Clock 03/13/2024, 9:31 PM

## 2024-03-13 NOTE — H&P (Signed)
 Psychiatric Admission Assessment Adult  Patient Identification: Brett Sanford MRN:  [id number] Date of Evaluation:  03/13/2024 Chief Complaint:  Alcohol use disorder, severe, dependence (HCC) [F10.20]   History of Present Illness: Brett Sanford is a 33 year old male who presents to [facility] voluntarily seeking treatment for alcohol use disorder and suicidal ideation. Pt has past history of  SI, OD,  MDD, alcohol abuse and polysubstance abuse.  Per chart review, patient initially presented to [facility] reporting passive suicidal ideation in the context of recent relapse on alcohol.    Patient reports he had been sober since October 2024 and began drinking about a month ago following the death of his aunt.  Patient reports he drinks daily between a pint and 1/5 of liquor each day.  He also endorses occasional use of marijuana.  Patient reports in light of his relapse on alcohol he has had "bad thoughts" suicidal in nature of possibly overdosing or walking to traffic.  He reports following last inpatient admission at this facility he was discharged to day program at [facility] participating for 1 week however reports leaving as explains he could not afford the program.  He is hopeful that following this admission he can be placed with [facility] as he has heard this facility offers longer-term treatment.  Patient reports he has attempted overdose in the past about 5 years ago where he took "a bunch of pills".  Patient reports he is currently on omeprazole and denies any other medications.  He reports he has had trazodone  taking as needed in the past however "these kidney nightmares I had rather try Seroquel".  Total Time spent with patient: {Time; 15 min - 8 hours:17441} Sleep  Sleep:No data recorded Past Psychiatric History: *** Psychiatric History:  Information collected from ***  Prev Dx/Sx: *** Current Psych Provider: *** Home Meds (current): *** Previous Med Trials:  *** Therapy: ***  Prior Psych Hospitalization: ***  Prior Self Harm: *** Prior Violence: ***  Family Psych History: *** Family Hx suicide: ***  Social History:  Developmental Hx: *** Educational Hx: *** Occupational Hx: *** Legal Hx: *** Living Situation: *** Spiritual Hx: *** Access to weapons/lethal means: ***   Substance History Alcohol: ***  Type of alcohol *** Last Drink *** Number of drinks per day *** History of alcohol withdrawal seizures *** History of DT's *** Tobacco: *** Illicit drugs: *** Prescription drug abuse: *** Rehab hx: *** Is the patient at risk to self? {yes no:314532}  Has the patient been a risk to self in the past 6 months? {yes no:314532}  Has the patient been a risk to self within the distant past? {yes no:314532}  Is the patient a risk to others? {yes no:314532}  Has the patient been a risk to others in the past 6 months? {yes no:314532}  Has the patient been a risk to others within the distant past? {yes no:314532}   [place] Scale:  Flowsheet Row Admission (Current) from 03/12/2024 in [facility] ED from 03/11/2024 in [facility] from 02/28/2024 in [facility])  C-SSRS RISK CATEGORY High Risk High Risk No Risk        Past Medical History:  Past Medical History:  Diagnosis Date   ADD (attention deficit disorder)    Drug abuse [facility])    Medical history non-contributory     Past Surgical History:  Procedure Laterality Date   NO PAST SURGERIES     Family  History:  Family History  Family history unknown: Yes    Social History:  Social History   Substance and Sexual Activity  Alcohol Use Yes   Alcohol/week: 8.0 standard drinks of alcohol   Types: 8 Shots of liquor per week   Comment: a few shots/5-6 days/week     Social History   Substance and Sexual Activity  Drug Use Yes   Types: Marijuana   Comment: once a week       Allergies:   Allergies  Allergen Reactions   Benadryl [Diphenhydramine Hcl] Other (See Comments)    Caused hyperactivity   Metoclopramide  Other (See Comments)    Jittery   Lab Results:  Results for orders placed or performed during the hospital encounter of 03/11/24 (from the past 48 hours)  CBC with Differential/Platelet     Status: Abnormal   Collection Time: 03/11/24  8:05 PM  Result Value Ref Range   WBC 7.3 4.0 - 10.5 K/uL   RBC 5.72 4.22 - 5.81 MIL/uL   Hemoglobin 19.0 (H) 13.0 - 17.0 g/dL   HCT 16.1 (H) 09.6 - 04.5 %   MCV 95.6 80.0 - 100.0 fL   MCH 33.2 26.0 - 34.0 pg   MCHC 34.7 30.0 - 36.0 g/dL   RDW 40.9 81.1 - 91.4 %   Platelets 359 150 - 400 K/uL   nRBC 0.0 0.0 - 0.2 %   Neutrophils Relative % 52 %   Neutro Abs 3.8 1.7 - 7.7 K/uL   Lymphocytes Relative 35 %   Lymphs Abs 2.6 0.7 - 4.0 K/uL   Monocytes Relative 10 %   Monocytes Absolute 0.7 0.1 - 1.0 K/uL   Eosinophils Relative 1 %   Eosinophils Absolute 0.1 0.0 - 0.5 K/uL   Basophils Relative 1 %   Basophils Absolute 0.1 0.0 - 0.1 K/uL   Immature Granulocytes 1 %   Abs Immature Granulocytes 0.05 0.00 - 0.07 K/uL    Comment: Performed at [facility], [address], [place], [place] [address]  Comprehensive metabolic panel     Status: Abnormal   Collection Time: 03/11/24  8:05 PM  Result Value Ref Range   Sodium 137 135 - 145 mmol/L   Potassium 3.8 3.5 - 5.1 mmol/L   Chloride 95 (L) 98 - 111 mmol/L   CO2 25 22 - 32 mmol/L   Glucose, Bld 95 70 - 99 mg/dL    Comment: Glucose reference range applies only to samples taken after fasting for at least 8 hours.   BUN 7 6 - 20 mg/dL   Creatinine, Ser 6.21 0.61 - 1.24 mg/dL   Calcium  10.4 (H) 8.9 - 10.3 mg/dL   Total Protein 7.7 6.5 - 8.1 g/dL   Albumin 4.5 3.5 - 5.0 g/dL   AST 308 (H) 15 - 41 U/L   ALT 109 (H) 0 - 44 U/L   Alkaline Phosphatase 59 38 - 126 U/L   Total Bilirubin 1.6 (H) 0.0 - 1.2 mg/dL   GFR, Estimated >65 >78 mL/min    Comment:  (NOTE) Calculated using the CKD-EPI Creatinine Equation (2021)    Anion gap 17 (H) 5 - 15    Comment: Performed at [facility], [address] [place], [place] [address]  Ethanol     Status: Abnormal   Collection Time: 03/11/24  8:05 PM  Result Value Ref Range   Alcohol, Ethyl (B) 195 (H) <15 mg/dL    Comment: (NOTE) For medical purposes only. Performed  at [facility], [address] [place], [place] [address]   TSH     Status: None   Collection Time: 03/11/24  8:05 PM  Result Value Ref Range   TSH 2.348 0.350 - 4.500 uIU/mL    Comment: Performed by a 3rd Generation assay with a functional sensitivity of <=0.01 uIU/mL. Performed at [facility], [address], [place], [place] [address]   POCT Urine Drug Screen - (I-Screen)     Status: Abnormal   Collection Time: 03/11/24  8:06 PM  Result Value Ref Range   POC Amphetamine UR None Detected NONE DETECTED (Cut Off Level 1000 ng/mL)   POC Secobarbital (BAR) None Detected NONE DETECTED (Cut Off Level 300 ng/mL)   POC Buprenorphine (BUP) None Detected NONE DETECTED (Cut Off Level 10 ng/mL)   POC Oxazepam (BZO) Positive (A) NONE DETECTED (Cut Off Level 300 ng/mL)   POC Cocaine UR None Detected NONE DETECTED (Cut Off Level 300 ng/mL)   POC Methamphetamine UR None Detected NONE DETECTED (Cut Off Level 1000 ng/mL)   POC Morphine  None Detected NONE DETECTED (Cut Off Level 300 ng/mL)   POC Methadone UR None Detected NONE DETECTED (Cut Off Level 300 ng/mL)   POC Oxycodone UR None Detected NONE DETECTED (Cut Off Level 100 ng/mL)   POC Marijuana UR Positive (A) NONE DETECTED (Cut Off Level 50 ng/mL)    Blood Alcohol level:  Lab Results  Component Value Date   ETH 195 (H) 03/11/2024   ETH 147 (H) 07/31/2023    Metabolic Disorder Labs:  Lab Results  Component Value Date   HGBA1C 4.9 07/31/2023   MPG 93.93 07/31/2023   MPG 96.8 04/15/2020   No results found for: "PROLACTIN" Lab Results  Component Value Date    CHOL 317 (H) 07/31/2023   TRIG 241 (H) 07/31/2023   HDL 110 07/31/2023   CHOLHDL 2.9 07/31/2023   VLDL 48 (H) 07/31/2023   LDLCALC 159 (H) 07/31/2023   LDLCALC NOT CALCULATED 04/15/2020    Current Medications: Current Facility-Administered Medications  Medication Dose Route Frequency Provider Last Rate Last Admin   acetaminophen  (TYLENOL ) tablet 650 mg  650 mg Oral Q6H PRN Olasunkanmi, Oluwatosin, NP   650 mg at 03/13/24 0839   alum & mag hydroxide-simeth (MAALOX/MYLANTA) 200-200-20 MG/5ML suspension 30 mL  30 mL Oral Q4H PRN Olasunkanmi, Oluwatosin, NP       chlordiazePOXIDE (LIBRIUM) capsule 25 mg  25 mg Oral QID Angelia Barcelona, NP       Followed by   Cecily Cohen ON 03/14/2024] chlordiazePOXIDE (LIBRIUM) capsule 25 mg  25 mg Oral TID Angelia Barcelona, NP       Followed by   Cecily Cohen ON 03/15/2024] chlordiazePOXIDE (LIBRIUM) capsule 25 mg  25 mg Oral Basilia Lima, NP       Followed by   Cecily Cohen ON 03/17/2024] chlordiazePOXIDE (LIBRIUM) capsule 25 mg  25 mg Oral Daily Angelia Barcelona, NP       dicyclomine  (BENTYL ) tablet 20 mg  20 mg Oral Q6H PRN Olasunkanmi, Oluwatosin, NP       hydrOXYzine  (ATARAX ) tablet 25 mg  25 mg Oral Q6H PRN Olasunkanmi, Oluwatosin, NP   25 mg at 03/13/24 2956   hydrOXYzine  (ATARAX ) tablet 25 mg  25 mg Oral Q6H PRN Olasunkanmi, Oluwatosin, NP       loperamide  (IMODIUM ) capsule 2-4 mg  2-4 mg Oral PRN Olasunkanmi, Oluwatosin, NP       LORazepam  (ATIVAN ) tablet 1  mg  1 mg Oral Q6H PRN Olasunkanmi, Oluwatosin, NP   1 mg at 03/12/24 2219   magnesium  hydroxide (MILK OF MAGNESIA) suspension 30 mL  30 mL Oral Daily PRN Olasunkanmi, Oluwatosin, NP       methocarbamol (ROBAXIN) tablet 500 mg  500 mg Oral Q8H PRN Olasunkanmi, Oluwatosin, NP       multivitamin with minerals tablet 1 tablet  1 tablet Oral Daily Olasunkanmi, Oluwatosin, NP   1 tablet at 03/13/24 0840   naproxen  (NAPROSYN ) tablet 500 mg  500 mg Oral BID PRN Olasunkanmi, Oluwatosin, NP       nicotine   (NICODERM CQ  - dosed in mg/24 hours) patch 21 mg  21 mg Transdermal Q0600 Olasunkanmi, Oluwatosin, NP       OLANZapine (ZYPREXA) injection 10 mg  10 mg Intramuscular TID PRN Olasunkanmi, Oluwatosin, NP       OLANZapine (ZYPREXA) injection 5 mg  5 mg Intramuscular TID PRN Olasunkanmi, Oluwatosin, NP       OLANZapine zydis (ZYPREXA) disintegrating tablet 5 mg  5 mg Oral TID PRN Olasunkanmi, Oluwatosin, NP       ondansetron  (ZOFRAN -ODT) disintegrating tablet 4 mg  4 mg Oral Q6H PRN Olasunkanmi, Oluwatosin, NP       pantoprazole  (PROTONIX ) EC tablet 40 mg  40 mg Oral Daily Olasunkanmi, Oluwatosin, NP   40 mg at 03/13/24 1610   thiamine  (VITAMIN B1) tablet 100 mg  100 mg Oral Daily Olasunkanmi, Oluwatosin, NP   100 mg at 03/13/24 9604   PTA Medications: Medications Prior to Admission  Medication Sig Dispense Refill Last Dose/Taking   acetaminophen  (TYLENOL ) 500 MG tablet Take 1,000 mg by mouth every 6 (six) hours as needed for headache.      omeprazole (PRILOSEC OTC) 20 MG tablet Take 20 mg by mouth daily.      ondansetron  (ZOFRAN -ODT) 4 MG disintegrating tablet Take 1 tablet (4 mg total) by mouth every 8 (eight) hours as needed for nausea or vomiting. 20 tablet 0    traZODone  (DESYREL ) 50 MG tablet Take 1 tablet (50 mg total) by mouth at bedtime as needed for sleep. 30 tablet 0     Psychiatric Specialty Exam:  Presentation  General Appearance:  Casual  Eye Contact: Fair  Speech: Clear and Coherent  Speech Volume: Normal    Mood and Affect  Mood: Anxious; Angry; Depressed; Hopeless  Affect: Blunt   Thought Process  Thought Processes: Coherent  Descriptions of Associations:Intact  Orientation:Full (Time, Place and Person)  Thought Content:WDL  Hallucinations:No data recorded Ideas of Reference:None  Suicidal Thoughts:No data recorded Homicidal Thoughts:No data recorded  Sensorium  Memory: Immediate Fair  Judgment: Poor  Insight: Lacking   Executive  Functions  Concentration: Good  Attention Span: Good  Recall: Good  Fund of Knowledge: Good  Language: Good   Psychomotor Activity  Psychomotor Activity:No data recorded  Assets  Assets: Desire for Improvement; Resilience; Social Support    Musculoskeletal: Strength & Muscle Tone: {desc; muscle tone:32375} Gait & Station: {PE GAIT ED VWUJ:81191}  Physical Exam: Physical Exam ROS Blood pressure (!) 127/95, pulse 79, temperature 98 F (36.7 C), temperature source Oral, resp. rate 18, height 5\' 8"  (1.727 m), weight 94.3 kg, SpO2 99%. Body mass index is 31.63 kg/m.  Principal Diagnosis: Alcohol use disorder, severe, dependence (HCC) Diagnosis:  Principal Problem:   Alcohol use disorder, severe, dependence (HCC)   Clinical Decision Making:  Treatment Plan Summary:  Safety and Monitoring:             --  Voluntary admission to inpatient psychiatric unit for safety, stabilization and treatment             -- Daily contact with patient to assess and evaluate symptoms and progress in treatment             -- Patient's case to be discussed in multi-disciplinary team meeting             -- Observation Level: q15 minute checks             -- Vital signs:  q12 hours             -- Precautions: suicide, elopement, and assault   2. Psychiatric Diagnoses and Treatment:                librium taper   Wellbutrin naloxone ???    -- The risks/benefits/side-effects/alternatives to this medication were discussed in detail with the patient and time was given for questions. The patient consents to medication trial.                -- Metabolic profile and EKG monitoring obtained while on an atypical antipsychotic (BMI: Lipid Panel: HbgA1c: QTc:)              -- Encouraged patient to participate in unit milieu and in scheduled group therapies                            3. Medical Issues Being Addressed:      4. Discharge Planning:              -- Social work and case  management to assist with discharge planning and identification of hospital follow-up needs prior to discharge             -- Estimated LOS: 5-7 days             -- Discharge Concerns: Need to establish a safety plan; Medication compliance and effectiveness             -- Discharge Goals: Return home with outpatient referrals follow ups  Physician Treatment Plan for Primary Diagnosis: Alcohol use disorder, severe, dependence (HCC) Long Term Goal(s): {[facility] MD Tx Plan Long Term Goals:30414007::"Improvement in symptoms so as ready for discharge"}  Short Term Goals: [facility] MD Tx Plan Short Term WUJWJ:[id number]}  Physician Treatment Plan for Secondary Diagnosis: Principal Problem:   Alcohol use disorder, severe, dependence (HCC)  Long Term Goal(s): {[facility] MD Tx Plan Long Term Goals:30414007::"Improvement in symptoms so as ready for discharge"}  Short Term Goals: [facility] MD Tx Plan Short Term FAOZH:[id number]}  I certify that inpatient services furnished can reasonably be expected to improve the patient's condition.    Angelia Barcelona, NP 6/4/20253:03 PM

## 2024-03-13 NOTE — Progress Notes (Signed)
   03/13/24 0839  Psych Admission Type (Psych Patients Only)  Admission Status Voluntary  Psychosocial Assessment  Patient Complaints Anxiety  Eye Contact Fair  Facial Expression Anxious  Affect Anxious  Speech Logical/coherent  Interaction Assertive  Motor Activity Tremors  Appearance/Hygiene Unremarkable  Behavior Characteristics Cooperative;Anxious  Mood Anxious  Thought Process  Coherency WDL  Content WDL  Delusions None reported or observed  Perception WDL  Hallucination None reported or observed  Judgment Impaired  Confusion None  Danger to Self  Current suicidal ideation? Denies  Agreement Not to Harm Self Yes  Description of Agreement Verbal  Danger to Others  Danger to Others None reported or observed

## 2024-03-13 NOTE — Group Note (Signed)
 Date:  03/13/2024 Time:  11:12 AM  Group Topic/Focus:  Goals Group:   The focus of this group is to help patients establish daily goals to achieve during treatment and discuss how the patient can incorporate goal setting into their daily lives to aide in recovery.    Participation Level:  Active  Participation Quality:  Appropriate  Affect:  Appropriate  Cognitive:  Appropriate  Insight: Appropriate  Engagement in Group:  Engaged  Modes of Intervention:  Discussion, Education, and Support  Additional Comments:    Laverne Potter 03/13/2024, 11:12 AM

## 2024-03-14 MED ORDER — TRAZODONE HCL 50 MG PO TABS
25.0000 mg | ORAL_TABLET | Freq: Every evening | ORAL | Status: DC | PRN
  Administered 2024-03-14 – 2024-03-15 (×2): 25 mg via ORAL
  Filled 2024-03-14 (×2): qty 1

## 2024-03-14 NOTE — Plan of Care (Signed)
   Problem: Education: Goal: Emotional status will improve Outcome: Progressing Goal: Mental status will improve Outcome: Progressing Goal: Verbalization of understanding the information provided will improve Outcome: Progressing

## 2024-03-14 NOTE — Progress Notes (Signed)
   03/13/24 2000  Psych Admission Type (Psych Patients Only)  Admission Status Voluntary  Psychosocial Assessment  Patient Complaints Anxiety  Eye Contact Fair  Facial Expression Anxious  Affect Anxious  Speech Logical/coherent  Interaction Assertive  Motor Activity Tremors  Appearance/Hygiene Unremarkable  Behavior Characteristics Cooperative;Appropriate to situation  Mood Anxious  Thought Process  Coherency WDL  Content WDL  Delusions None reported or observed  Perception WDL  Hallucination None reported or observed  Judgment Impaired  Confusion None  Danger to Self  Current suicidal ideation? Denies  Agreement Not to Harm Self Yes  Description of Agreement verbal  Danger to Others  Danger to Others None reported or observed   Patient having abdominal cramping. Prns given with some relief.

## 2024-03-14 NOTE — Group Note (Signed)
 Recreation Therapy Group Note   Group Topic:Coping Skills  Group Date: 03/14/2024 Start Time: 1530 End Time: 1620 Facilitators: Yvonna Herder, CTRS Location: Craft Room  Group Description: Coping A-Z. LRT and patients engage in a guided discussion on what coping skills are and gave specific examples. LRT passed out a handout labeled Coping A-Z with blank spaces beside each letter. LRT prompted patients to come up with a coping skill for each of the letters. LRT and patients went over the handout and gave ideas for each letter if anyone had any blanks left on their paper. Patients kept this handout with them that listed 26 different coping skills.   Goal Area(s) Addressed: Patients will be able to define "coping skills". Patient will identify new coping skills.  Patient will increase communication.   Affect/Mood: Appropriate   Participation Level: Active   Participation Quality: Independent   Behavior: Appropriate   Speech/Thought Process: Coherent   Insight: Good   Judgement: Good   Modes of Intervention: Activity   Patient Response to Interventions:  Receptive   Education Outcome:  Acknowledges education   Clinical Observations/Individualized Feedback: Brett Sanford was active in their participation of session activities and group discussion. Pt interacted well with LRT and peers duration of session.    Plan: Continue to engage patient in RT group sessions 2-3x/week.   Deatrice Factor, LRT, CTRS 03/14/2024 5:21 PM

## 2024-03-14 NOTE — Progress Notes (Signed)
 [facility] MD Progress Note  03/15/2024 12:26 PM Brett Sanford  MRN:  [id number]   Subjective:  Chart reviewed, case discussed in multidisciplinary meeting, patient seen during rounds.   Brett Sanford is a 33 year old male who presents to [facility] voluntarily seeking treatment for alcohol use disorder and suicidal ideation. Pt has past history of  SI, OD,  MDD, alcohol abuse and polysubstance abuse.   Per chart review, patient initially presented to [facility] reporting passive suicidal ideation in the context of recent relapse on alcohol.    Patient seen today for follow-up psychiatric evaluation.  Upon approach he is noted to be walking in the hall and is agreeable for interview.  In reviewing symptoms he reports sleep has improved as well as appetite however continues to remain depressed and would like to discuss the addition of antidepressant.  Reports symptoms of alcohol withdrawal continue to improve reporting CIWA, scheduled Librium and available prns are beneficial. Patient relays significant history of depressive symptoms beginning in middle school years reporting he has struggled with symptoms of depression outside of alcohol use.  He shares specific symptoms such as sadness, low motivation, anhedonia, low energy very poor sleep constant worrying and social isolation.  He reports has trialed sertraline  and Effexor  in the past as well as Wellbutrin however is unable to report response or benefit from either.  Patient also reports struggles with daily anxiety that feels like palpitations and shares is often "jittery" as if he cannot calm down.  We discuss recommended medication to target the symptoms to include Wellbutrin XL 150 mg by mouth daily for depression and propranolol 10 mg by mouth every 6 hours as needed for anxiety.  Medication education is provided to include risk benefits side effects and mechanism of action.  Patient verbalizes understanding and agrees to plan of care.   Screening remains negative for history of mania.    Sleep: Good  Appetite:  Good  Past Psychiatric History: see h&P Family History:  Family History  Family history unknown: Yes   Social History:  Social History   Substance and Sexual Activity  Alcohol Use Yes   Alcohol/week: 8.0 standard drinks of alcohol   Types: 8 Shots of liquor per week   Comment: a few shots/5-6 days/week     Social History   Substance and Sexual Activity  Drug Use Yes   Types: Marijuana   Comment: once a week    Social History   Socioeconomic History   Marital status: Single    Spouse name: Not on file   Number of children: Not on file   Years of education: Not on file   Highest education level: Not on file  Occupational History   Not on file  Tobacco Use   Smoking status: Former    Current packs/day: 1.50    Average packs/day: 1.5 packs/day for 5.0 years (7.5 ttl pk-yrs)    Types: Cigarettes   Smokeless tobacco: Never   Tobacco comments:    1-2 packs per day/5 years  Vaping Use   Vaping status: Never Used  Substance and Sexual Activity   Alcohol use: Yes    Alcohol/week: 8.0 standard drinks of alcohol    Types: 8 Shots of liquor per week    Comment: a few shots/5-6 days/week   Drug use: Yes    Types: Marijuana    Comment: once a week   Sexual activity: Yes  Other Topics Concern   Not on file  Social History Narrative  Pt employed and living with mother who he identifies as his main social support. Pt has not taken his meds for the last 6 months. Pt has trouble getting medications because he does not have insurance.   Social Drivers of Corporate investment banker Strain: Low Risk  (09/29/2022)   Received from [facility], [facility]   Overall Financial Resource Strain (CARDIA)    Difficulty of Paying Living Expenses: Not very hard  Food Insecurity: No Food Insecurity (03/12/2024)   Hunger Vital Sign    Worried About Running Out of Food in the Last Year: Never true     Ran Out of Food in the Last Year: Never true  Transportation Needs: No Transportation Needs (03/12/2024)   PRAPARE - Administrator, Civil Service (Medical): No    Lack of Transportation (Non-Medical): No  Physical Activity: Sufficiently Active (09/29/2022)   Received from [facility], [facility]   Exercise Vital Sign    Days of Exercise per Week: 3 days    Minutes of Exercise per Session: 120 min  Stress: Stress Concern Present (09/29/2022)   Received from [facility], [facility] - Occupational Stress Questionnaire    Feeling of Stress : To some extent  Social Connections: Socially Isolated (09/29/2022)   Received from [facility], [facility]   Social Connection and Isolation Panel [NHANES]    Frequency of Communication with Friends and Family: More than three times a week    Frequency of Social Gatherings with Friends and Family: More than three times a week    Attends Religious Services: Never    Database administrator or Organizations: No    Attends Engineer, structural: Never    Marital Status: Never married   Past Medical History:  Past Medical History:  Diagnosis Date   ADD (attention deficit disorder)    Drug abuse (HCC)    Medical history non-contributory     Past Surgical History:  Procedure Laterality Date   NO PAST SURGERIES      Current Medications: Current Facility-Administered Medications  Medication Dose Route Frequency Provider Last Rate Last Admin   acetaminophen  (TYLENOL ) tablet 650 mg  650 mg Oral Q6H PRN Olasunkanmi, Oluwatosin, NP   650 mg at 03/15/24 0849   alum & mag hydroxide-simeth (MAALOX/MYLANTA) 200-200-20 MG/5ML suspension 30 mL  30 mL Oral Q4H PRN Olasunkanmi, Oluwatosin, NP   30 mL at 03/13/24 1521   buPROPion (WELLBUTRIN XL) 24 hr tablet 150 mg  150 mg Oral Daily Angelia Barcelona, NP       chlordiazePOXIDE (LIBRIUM) capsule 25 mg  25 mg Oral TID Angelia Barcelona, NP   25 mg at 03/15/24 0844   Followed by   chlordiazePOXIDE (LIBRIUM) capsule 25 mg  25 mg Oral Basilia Lima, NP       Followed by   Cecily Cohen ON 03/17/2024] chlordiazePOXIDE (LIBRIUM) capsule 25 mg  25 mg Oral Daily Angelia Barcelona, NP       dicyclomine  (BENTYL ) tablet 20 mg  20 mg Oral Q6H PRN Olasunkanmi, Oluwatosin, NP   20 mg at 03/14/24 2306   hydrOXYzine  (ATARAX ) tablet 25 mg  25 mg Oral Q6H PRN Olasunkanmi, Oluwatosin, NP   25 mg at 03/15/24 0849   magnesium  hydroxide (MILK OF MAGNESIA) suspension 30 mL  30 mL Oral Daily PRN Olasunkanmi, Oluwatosin, NP  methocarbamol (ROBAXIN) tablet 500 mg  500 mg Oral Q8H PRN Olasunkanmi, Oluwatosin, NP   500 mg at 03/14/24 2127   multivitamin with minerals tablet 1 tablet  1 tablet Oral Daily Olasunkanmi, Oluwatosin, NP   1 tablet at 03/15/24 0844   naproxen  (NAPROSYN ) tablet 500 mg  500 mg Oral BID PRN Olasunkanmi, Oluwatosin, NP   500 mg at 03/14/24 1523   OLANZapine (ZYPREXA) injection 10 mg  10 mg Intramuscular TID PRN Olasunkanmi, Oluwatosin, NP       OLANZapine (ZYPREXA) injection 5 mg  5 mg Intramuscular TID PRN Olasunkanmi, Oluwatosin, NP       OLANZapine zydis (ZYPREXA) disintegrating tablet 5 mg  5 mg Oral TID PRN Olasunkanmi, Oluwatosin, NP   5 mg at 03/14/24 2128   ondansetron  (ZOFRAN -ODT) disintegrating tablet 4 mg  4 mg Oral Q6H PRN Olasunkanmi, Oluwatosin, NP   4 mg at 03/14/24 2305   pantoprazole  (PROTONIX ) EC tablet 40 mg  40 mg Oral Daily Olasunkanmi, Oluwatosin, NP   40 mg at 03/15/24 0844   propranolol (INDERAL) tablet 10 mg  10 mg Oral Q6H PRN Angelia Barcelona, NP       thiamine  (VITAMIN B1) tablet 100 mg  100 mg Oral Daily Olasunkanmi, Oluwatosin, NP   100 mg at 03/15/24 0844   traZODone  (DESYREL ) tablet 25 mg  25 mg Oral QHS PRN Dorthea Gauze, NP   25 mg at 03/14/24 2151    Lab Results: No results found for this or any previous visit (from the past 48 hours).  Blood Alcohol level:  Lab Results   Component Value Date   ETH 195 (H) 03/11/2024   ETH 147 (H) 07/31/2023    Metabolic Disorder Labs: Lab Results  Component Value Date   HGBA1C 4.9 07/31/2023   MPG 93.93 07/31/2023   MPG 96.8 04/15/2020   No results found for: "PROLACTIN" Lab Results  Component Value Date   CHOL 317 (H) 07/31/2023   TRIG 241 (H) 07/31/2023   HDL 110 07/31/2023   CHOLHDL 2.9 07/31/2023   VLDL 48 (H) 07/31/2023   LDLCALC 159 (H) 07/31/2023   LDLCALC NOT CALCULATED 04/15/2020     Psychiatric Specialty Exam:  General Appearance:  Appropriate for Environment  Eye Contact: Good  Speech: Normal Rate  Speech Volume: Normal  Mood: Depressed  Affect: Flat  Thought Processes: Linear  Descriptions of Associations:Intact  Orientation:Full (Time, Place and Person)  Thought Content:Logical  Hallucinations:Hallucinations: None  Ideas of Reference:None  Suicidal Thoughts:Suicidal Thoughts: Yes, Passive SI Passive Intent and/or Plan: Without Intent  Homicidal Thoughts:Homicidal Thoughts: No   Sensorium  Memory: Immediate Good  Judgment: Fair  Insight: Fair   Art therapist  Concentration: Good  Attention Span: Good  Recall: Good  Fund of Knowledge: Good  Language: Good   Psychomotor Activity  Psychomotor Activity:Psychomotor Activity: Normal  Musculoskeletal: Strength & Muscle Tone: within normal limits Gait & Station: normal Assets  Assets: Desire for Improvement; Communication Skills; Housing    Physical Exam: Physical Exam ROS Blood pressure (!) 141/88, pulse 65, temperature (!) 97.2 F (36.2 C), resp. rate 20, height 5\' 8"  (1.727 m), weight 94.3 kg, SpO2 100%. Body mass index is 31.63 kg/m.  Diagnosis: Principal Problem:   Alcohol use disorder, severe, dependence (HCC)   PLAN: Safety and Monitoring:  -- Voluntary admission to inpatient psychiatric unit for safety, stabilization and treatment  -- Daily contact with patient to  assess and evaluate symptoms and progress in treatment  -- Patient's case to  be discussed in multi-disciplinary team meeting  -- Observation Level : q15 minute checks  -- Vital signs:  q12 hours  -- Precautions: suicide, elopement, and assault -- Encouraged patient to participate in unit milieu and in scheduled group therapies  2. Psychiatric Diagnoses and Treatment:      Alcohol use disorder, severe, dependence (HCC)    Major Depressive Disorder, chronic moderate wo psychotic features-Begin Wellbutrin XL 150mg  po daily               Generalized Anxiety Disorder-begin propranolol 10mg  po Q6h prn anxiety      3. Medical Issues Being Addressed: medication management and assessment to support safe alcohol detox   4. Discharge Planning:   -- Social work and case management to assist with discharge planning and identification of hospital follow-up needs prior to discharge  -- Estimated LOS: 3-4 days  Angelia Barcelona, NP 03/15/2024, 12:26 PM

## 2024-03-14 NOTE — Progress Notes (Signed)
   03/14/24 1003  Psych Admission Type (Psych Patients Only)  Admission Status Voluntary  Psychosocial Assessment  Patient Complaints Anxiety;Substance abuse;Depression;Sleep disturbance  Eye Contact Fair  Facial Expression Anxious  Affect Anxious  Speech Logical/coherent  Interaction Assertive  Motor Activity Tremors;Slow  Appearance/Hygiene Unremarkable  Behavior Characteristics Cooperative  Mood Anxious;Pleasant (Patient writes his goal for today is "getting my meds straight." He writes he will "talk with provider and nurse" to help reach that goal.)  Thought Process  Coherency WDL  Content WDL  Delusions None reported or observed  Perception WDL  Hallucination None reported or observed  Judgment Poor  Confusion None  Danger to Self  Current suicidal ideation? Denies  Self-Injurious Behavior No self-injurious ideation or behavior indicators observed or expressed   Danger to Others  Danger to Others None reported or observed

## 2024-03-14 NOTE — Group Note (Signed)
 [facility] LCSW Group Therapy Note   Group Date: 03/14/2024 Start Time: 1300 End Time: 1335   Type of Therapy/Topic:  Group Therapy:  Balance in Life  Participation Level:  Active   Description of Group:    This group will address the concept of balance and how it feels and looks when one is unbalanced. Patients will be encouraged to process areas in their lives that are out of balance, and identify reasons for remaining unbalanced. Facilitators will guide patients utilizing problem- solving interventions to address and correct the stressor making their life unbalanced. Understanding and applying boundaries will be explored and addressed for obtaining  and maintaining a balanced life. Patients will be encouraged to explore ways to assertively make their unbalanced needs known to significant others in their lives, using other group members and facilitator for support and feedback.  Therapeutic Goals: Patient will identify two or more emotions or situations they have that consume much of in their lives. Patient will identify signs/triggers that life has become out of balance:  Patient will identify two ways to set boundaries in order to achieve balance in their lives:  Patient will demonstrate ability to communicate their needs through discussion and/or role plays  Summary of Patient Progress: Patient was present for the entirety of the group process. He was engaged in the discussion and comments furthered the conversation. Drugs/alcohol were noted as something that caused him to become off balance. Pt has some insight into himself and the topic. He appeared open and receptive to feedback from both his peers and the facilitator.   Therapeutic Modalities:   Cognitive Behavioral Therapy Solution-Focused Therapy Assertiveness Training   Randolm Butte, LCSW

## 2024-03-14 NOTE — Plan of Care (Signed)

## 2024-03-14 NOTE — Group Note (Signed)
 Date:  03/14/2024 Time:  8:24 PM  Group Topic/Focus:  Orientation:   The focus of this group is to educate the patient on the purpose and policies of crisis stabilization and provide a format to answer questions about their admission.  The group details unit policies and expectations of patients while admitted.    Participation Level:  Active  Participation Quality:  Appropriate, Attentive, and Sharing  Affect:  Appropriate  Cognitive:  Alert and Appropriate  Insight: Appropriate and Good  Engagement in Group:  Engaged  Modes of Intervention:  Clarification, Education, Orientation, Rapport Building, and Support  Additional Comments:     Dugan Vanhoesen 03/14/2024, 8:24 PM

## 2024-03-14 NOTE — Group Note (Signed)
 Recreation Therapy Group Note   Group Topic:Relaxation  Group Date: 03/14/2024 Start Time: 1000 End Time: 1050 Facilitators: Deatrice Factor, LRT, CTRS Location: Craft Room  Group Description: PMR (Progressive Muscle Relaxation). LRT asks patients their current level of stress/anxiety from 1-10, with 10 being the highest. LRT educates patients on what PMR is and the benefits that come from it. Patients are asked to sit with their feet flat on the floor while sitting up and all the way back in their chair, if possible. LRT and pts follow a prompt through a speaker that requires you to tense and release different muscles in their body and focus on their breathing. During session, lights are off and soft music is being played. Pts are given a stress ball to use if needed. At the end of the prompt, LRT asks patients to rank their current levels of stress/anxiety from 1-10, 10 being the highest. LRT provides patients with an education handout on PMR.   Goal Area(s) Addressed:  Patients will be able to describe progressive muscle relaxation.  Patient will practice using relaxation technique. Patient will identify a new coping skill.  Patient will follow multistep directions to reduce anxiety and stress.   Affect/Mood: N/A   Participation Level: Did not attend    Clinical Observations/Individualized Feedback: Patient did not attend group.   Plan: Continue to engage patient in RT group sessions 2-3x/week.   Deatrice Factor, LRT, CTRS 03/14/2024 11:38 AM

## 2024-03-14 NOTE — Group Note (Signed)
 Date:  03/14/2024 Time:  10:10 AM  Group Topic/Focus:   Goals Group:   The focus of this group is to help patients establish daily goals to achieve during treatment and discuss how the patient can incorporate goal setting into their daily lives to aide in recovery.   Participation Level:  Did Not Attend   Leanda Padmore A Finneas Mathe 03/14/2024, 10:10 AM

## 2024-03-15 MED ORDER — PROPRANOLOL HCL 20 MG PO TABS
10.0000 mg | ORAL_TABLET | Freq: Four times a day (QID) | ORAL | Status: DC | PRN
  Administered 2024-03-15 – 2024-03-20 (×12): 10 mg via ORAL
  Filled 2024-03-15 (×12): qty 1

## 2024-03-15 MED ORDER — BUPROPION HCL ER (XL) 150 MG PO TB24
150.0000 mg | ORAL_TABLET | Freq: Every day | ORAL | Status: DC
  Administered 2024-03-15 – 2024-03-20 (×6): 150 mg via ORAL
  Filled 2024-03-15 (×6): qty 1

## 2024-03-15 NOTE — Progress Notes (Signed)
 Patient has new med orders this shift.  CIWA continues.  His HR was up to 110 after walking to med room but patient was asymptomatic.

## 2024-03-15 NOTE — Progress Notes (Signed)
   03/15/24 0844  Psych Admission Type (Psych Patients Only)  Admission Status Voluntary  Psychosocial Assessment  Patient Complaints Anxiety  Eye Contact Watchful  Facial Expression Anxious  Affect Anxious  Speech Logical/coherent  Interaction Assertive  Motor Activity Slow  Appearance/Hygiene Unremarkable  Behavior Characteristics Cooperative  Mood Anxious;Pleasant  Aggressive Behavior  Targets Self  Effect No apparent injury  Thought Process  Coherency WDL  Content WDL  Delusions None reported or observed  Perception WDL  Hallucination None reported or observed  Judgment Poor  Confusion None  Danger to Self  Current suicidal ideation? Denies  Danger to Others  Danger to Others None reported or observed

## 2024-03-15 NOTE — Group Note (Signed)
 Recreation Therapy Group Note   Group Topic:Leisure Education  Group Date: 03/15/2024 Start Time: 1030 End Time: 1145 Facilitators: Deatrice Factor, LRT, CTRS Location: Craft Room  Group Description: Leisure. Patients were given the option to choose from singing karaoke, coloring mandalas, using oil pastels, journaling, or playing with play-doh. LRT and pts discussed the meaning of leisure, the importance of participating in leisure during their free time/when they're outside of the hospital, as well as how our leisure interests can also serve as coping skills.   Goal Area(s) Addressed:  Patient will identify a current leisure interest.  Patient will learn the definition of "leisure". Patient will practice making a positive decision. Patient will have the opportunity to try a new leisure activity. Patient will communicate with peers and LRT.    Affect/Mood: N/A   Participation Level: Did not attend    Clinical Observations/Individualized Feedback: Patient did not attend group.   Plan: Continue to engage patient in RT group sessions 2-3x/week.   Deatrice Factor, LRT, CTRS 03/15/2024 1:20 PM

## 2024-03-15 NOTE — Plan of Care (Signed)

## 2024-03-15 NOTE — Group Note (Signed)
 Date:  03/15/2024 Time:  10:09 AM  Group Topic/Focus:  Goals Group:   The focus of this group is to help patients establish daily goals to achieve during treatment and discuss how the patient can incorporate goal setting into their daily lives to aide in recovery.   Participation Level:  Did Not Attend   Camala Talwar A Selene Peltzer 03/15/2024, 10:09 AM

## 2024-03-15 NOTE — Plan of Care (Signed)
 Brett Sanford is a 33 y.o. male patient. No diagnosis found. Past Medical History:  Diagnosis Date   ADD (attention deficit disorder)    Drug abuse (HCC)    Medical history non-contributory    Current Facility-Administered Medications  Medication Dose Route Frequency Provider Last Rate Last Admin   acetaminophen  (TYLENOL ) tablet 650 mg  650 mg Oral Q6H PRN Olasunkanmi, Oluwatosin, NP   650 mg at 03/14/24 2126   alum & mag hydroxide-simeth (MAALOX/MYLANTA) 200-200-20 MG/5ML suspension 30 mL  30 mL Oral Q4H PRN Olasunkanmi, Oluwatosin, NP   30 mL at 03/13/24 1521   chlordiazePOXIDE (LIBRIUM) capsule 25 mg  25 mg Oral TID Angelia Barcelona, NP   25 mg at 03/14/24 1731   Followed by   chlordiazePOXIDE (LIBRIUM) capsule 25 mg  25 mg Oral Basilia Lima, NP       Followed by   Cecily Cohen ON 03/17/2024] chlordiazePOXIDE (LIBRIUM) capsule 25 mg  25 mg Oral Daily Angelia Barcelona, NP       dicyclomine  (BENTYL ) tablet 20 mg  20 mg Oral Q6H PRN Olasunkanmi, Oluwatosin, NP   20 mg at 03/14/24 2306   hydrOXYzine  (ATARAX ) tablet 25 mg  25 mg Oral Q6H PRN Olasunkanmi, Oluwatosin, NP   25 mg at 03/13/24 4540   magnesium  hydroxide (MILK OF MAGNESIA) suspension 30 mL  30 mL Oral Daily PRN Olasunkanmi, Oluwatosin, NP       methocarbamol (ROBAXIN) tablet 500 mg  500 mg Oral Q8H PRN Olasunkanmi, Oluwatosin, NP   500 mg at 03/14/24 2127   multivitamin with minerals tablet 1 tablet  1 tablet Oral Daily Olasunkanmi, Oluwatosin, NP   1 tablet at 03/14/24 0815   naproxen  (NAPROSYN ) tablet 500 mg  500 mg Oral BID PRN Olasunkanmi, Oluwatosin, NP   500 mg at 03/14/24 1523   OLANZapine (ZYPREXA) injection 10 mg  10 mg Intramuscular TID PRN Olasunkanmi, Oluwatosin, NP       OLANZapine (ZYPREXA) injection 5 mg  5 mg Intramuscular TID PRN Olasunkanmi, Oluwatosin, NP       OLANZapine zydis (ZYPREXA) disintegrating tablet 5 mg  5 mg Oral TID PRN Olasunkanmi, Oluwatosin, NP   5 mg at 03/14/24 2128   ondansetron   (ZOFRAN -ODT) disintegrating tablet 4 mg  4 mg Oral Q6H PRN Olasunkanmi, Oluwatosin, NP   4 mg at 03/14/24 2305   pantoprazole  (PROTONIX ) EC tablet 40 mg  40 mg Oral Daily Olasunkanmi, Oluwatosin, NP   40 mg at 03/14/24 0815   thiamine  (VITAMIN B1) tablet 100 mg  100 mg Oral Daily Olasunkanmi, Oluwatosin, NP   100 mg at 03/14/24 0815   traZODone  (DESYREL ) tablet 25 mg  25 mg Oral QHS PRN Dorthea Gauze, NP   25 mg at 03/14/24 2151   Allergies  Allergen Reactions   Benadryl [Diphenhydramine Hcl] Other (See Comments)    Caused hyperactivity   Metoclopramide  Other (See Comments)    Jittery   Principal Problem:   Alcohol use disorder, severe, dependence (HCC)  Blood pressure (!) 141/98, pulse 79, temperature (!) 97.4 F (36.3 C), resp. rate 18, height 5\' 8"  (1.727 m), weight 94.3 kg, SpO2 100%.  P/N: Patient was cooperative but anxious and needy. Took meds as ordered. Denied SI & HI. Pt. Patient mom visited last evening and he verbalized the visit went well. Requested PRN meds for feeling nauseous and abd cramps.  Brett Sanford Brett Sanford 03/15/2024

## 2024-03-15 NOTE — Progress Notes (Signed)
 [facility] MD Progress Note  03/15/2024 12:26 PM RODARIUS KICHLINE  MRN:  [id number]   Brett Sanford is a 33 year old male who presents to [facility] voluntarily seeking treatment for alcohol use disorder and suicidal ideation. Pt has past history of  SI, OD,  MDD, alcohol abuse and polysubstance abuse.   Per chart review, patient initially presented to [facility] reporting passive suicidal ideation in the context of recent relapse on alcohol.    Subjective:  Chart reviewed, case discussed in multidisciplinary meeting, patient seen during rounds.   Patient seen today for follow-up psychiatric evaluation.  In reviewing symptoms he reports sleep has improved as well as appetite however continues to remain depressed and would like to discuss the addition of antidepressant.   Patient endorses symptoms of depression including sadness, social hospitalization, poor energy, low motivation and anhedonia that have been present since his early teens in the absence of alcohol abuse.  We discussed options of beginning medication to address symptoms of depression as determine depressive symptoms are not solely in the context of alcohol abuse.  Discussed plan to begin Wellbutrin XL 150 mg once daily. He adds medication has been held in the past due to elevated LFTs however note labs are much improved during this stay.  Patient also reports significant anxiety during the day, realize this could be part of symptoms of withdrawal however he reports frequently throughout the day he struggles with jitteriness and daily anxiety for most of the day every day prior to withdrawing from alcohol.  Discussed option of adding propranolol 10 mg to address the symptoms. Decision made with pt to continue current plan of care with out the discussed medication additions until symptoms of withdrawal have improved, he verbalizes understanding and agreement.   Sleep: Fair  Appetite:  Good  Past Psychiatric History: see  h&P Family History:  Family History  Family history unknown: Yes   Social History:  Social History   Substance and Sexual Activity  Alcohol Use Yes   Alcohol/week: 8.0 standard drinks of alcohol   Types: 8 Shots of liquor per week   Comment: a few shots/5-6 days/week     Social History   Substance and Sexual Activity  Drug Use Yes   Types: Marijuana   Comment: once a week    Social History   Socioeconomic History   Marital status: Single    Spouse name: Not on file   Number of children: Not on file   Years of education: Not on file   Highest education level: Not on file  Occupational History   Not on file  Tobacco Use   Smoking status: Former    Current packs/day: 1.50    Average packs/day: 1.5 packs/day for 5.0 years (7.5 ttl pk-yrs)    Types: Cigarettes   Smokeless tobacco: Never   Tobacco comments:    1-2 packs per day/5 years  Vaping Use   Vaping status: Never Used  Substance and Sexual Activity   Alcohol use: Yes    Alcohol/week: 8.0 standard drinks of alcohol    Types: 8 Shots of liquor per week    Comment: a few shots/5-6 days/week   Drug use: Yes    Types: Marijuana    Comment: once a week   Sexual activity: Yes  Other Topics Concern   Not on file  Social History Narrative   Pt employed and living with mother who he identifies as his main social support. Pt has not taken his meds for  the last 6 months. Pt has trouble getting medications because he does not have insurance.   Social Drivers of Corporate investment banker Strain: Low Risk  (09/29/2022)   Received from [facility], [facility]   Overall Financial Resource Strain (CARDIA)    Difficulty of Paying Living Expenses: Not very hard  Food Insecurity: No Food Insecurity (03/12/2024)   Hunger Vital Sign    Worried About Running Out of Food in the Last Year: Never true    Ran Out of Food in the Last Year: Never true  Transportation Needs: No Transportation Needs (03/12/2024)   PRAPARE -  Administrator, Civil Service (Medical): No    Lack of Transportation (Non-Medical): No  Physical Activity: Sufficiently Active (09/29/2022)   Received from [facility], [facility]   Exercise Vital Sign    Days of Exercise per Week: 3 days    Minutes of Exercise per Session: 120 min  Stress: Stress Concern Present (09/29/2022)   Received from [facility], [facility] - Occupational Stress Questionnaire    Feeling of Stress : To some extent  Social Connections: Socially Isolated (09/29/2022)   Received from [facility], [facility]   Social Connection and Isolation Panel [NHANES]    Frequency of Communication with Friends and Family: More than three times a week    Frequency of Social Gatherings with Friends and Family: More than three times a week    Attends Religious Services: Never    Database administrator or Organizations: No    Attends Engineer, structural: Never    Marital Status: Never married   Past Medical History:  Past Medical History:  Diagnosis Date   ADD (attention deficit disorder)    Drug abuse (HCC)    Medical history non-contributory     Past Surgical History:  Procedure Laterality Date   NO PAST SURGERIES      Current Medications: Current Facility-Administered Medications  Medication Dose Route Frequency Provider Last Rate Last Admin   acetaminophen  (TYLENOL ) tablet 650 mg  650 mg Oral Q6H PRN Olasunkanmi, Oluwatosin, NP   650 mg at 03/15/24 0849   alum & mag hydroxide-simeth (MAALOX/MYLANTA) 200-200-20 MG/5ML suspension 30 mL  30 mL Oral Q4H PRN Olasunkanmi, Oluwatosin, NP   30 mL at 03/13/24 1521   buPROPion (WELLBUTRIN XL) 24 hr tablet 150 mg  150 mg Oral Daily Angelia Barcelona, NP       chlordiazePOXIDE (LIBRIUM) capsule 25 mg  25 mg Oral TID Angelia Barcelona, NP   25 mg at 03/15/24 0844   Followed by   chlordiazePOXIDE (LIBRIUM) capsule 25 mg  25 mg Oral  Basilia Lima, NP       Followed by   Cecily Cohen ON 03/17/2024] chlordiazePOXIDE (LIBRIUM) capsule 25 mg  25 mg Oral Daily Angelia Barcelona, NP       dicyclomine  (BENTYL ) tablet 20 mg  20 mg Oral Q6H PRN Olasunkanmi, Oluwatosin, NP   20 mg at 03/14/24 2306   hydrOXYzine  (ATARAX ) tablet 25 mg  25 mg Oral Q6H PRN Olasunkanmi, Oluwatosin, NP   25 mg at 03/15/24 0849   magnesium  hydroxide (MILK OF MAGNESIA) suspension 30 mL  30 mL Oral Daily PRN Olasunkanmi, Oluwatosin, NP       methocarbamol (ROBAXIN) tablet 500 mg  500 mg Oral Q8H PRN Olasunkanmi, Oluwatosin, NP   500 mg at  03/14/24 2127   multivitamin with minerals tablet 1 tablet  1 tablet Oral Daily Olasunkanmi, Oluwatosin, NP   1 tablet at 03/15/24 0844   naproxen  (NAPROSYN ) tablet 500 mg  500 mg Oral BID PRN Olasunkanmi, Oluwatosin, NP   500 mg at 03/14/24 1523   OLANZapine (ZYPREXA) injection 10 mg  10 mg Intramuscular TID PRN Olasunkanmi, Oluwatosin, NP       OLANZapine (ZYPREXA) injection 5 mg  5 mg Intramuscular TID PRN Olasunkanmi, Oluwatosin, NP       OLANZapine zydis (ZYPREXA) disintegrating tablet 5 mg  5 mg Oral TID PRN Olasunkanmi, Oluwatosin, NP   5 mg at 03/14/24 2128   ondansetron  (ZOFRAN -ODT) disintegrating tablet 4 mg  4 mg Oral Q6H PRN Olasunkanmi, Oluwatosin, NP   4 mg at 03/14/24 2305   pantoprazole  (PROTONIX ) EC tablet 40 mg  40 mg Oral Daily Olasunkanmi, Oluwatosin, NP   40 mg at 03/15/24 0844   propranolol (INDERAL) tablet 10 mg  10 mg Oral Q6H PRN Angelia Barcelona, NP       thiamine  (VITAMIN B1) tablet 100 mg  100 mg Oral Daily Olasunkanmi, Oluwatosin, NP   100 mg at 03/15/24 0844   traZODone  (DESYREL ) tablet 25 mg  25 mg Oral QHS PRN Dorthea Gauze, NP   25 mg at 03/14/24 2151    Lab Results: No results found for this or any previous visit (from the past 48 hours).  Blood Alcohol level:  Lab Results  Component Value Date   ETH 195 (H) 03/11/2024   ETH 147 (H) 07/31/2023    Metabolic Disorder Labs: Lab  Results  Component Value Date   HGBA1C 4.9 07/31/2023   MPG 93.93 07/31/2023   MPG 96.8 04/15/2020   No results found for: "PROLACTIN" Lab Results  Component Value Date   CHOL 317 (H) 07/31/2023   TRIG 241 (H) 07/31/2023   HDL 110 07/31/2023   CHOLHDL 2.9 07/31/2023   VLDL 48 (H) 07/31/2023   LDLCALC 159 (H) 07/31/2023   LDLCALC NOT CALCULATED 04/15/2020    Psychiatric Specialty Exam:  Presentation  General Appearance:  Appropriate for Environment  Eye Contact: Good  Speech: Normal Rate  Speech Volume: Normal    Mood and Affect  Mood: Depressed  Affect: Flat   Thought Process  Thought Processes: Linear  Descriptions of Associations:Intact  Orientation:Full (Time, Place and Person)  Thought Content:Logical  Hallucinations:Hallucinations: None  Ideas of Reference:None  Suicidal Thoughts:Suicidal Thoughts: Yes, Passive SI Passive Intent and/or Plan: Without Intent  Homicidal Thoughts:Homicidal Thoughts: No   Sensorium  Memory: Immediate Good  Judgment: Fair  Insight: Fair   Art therapist  Concentration: Good  Attention Span: Good  Recall: Good  Fund of Knowledge: Good  Language: Good   Psychomotor Activity  Psychomotor Activity: Psychomotor Activity: Normal  Musculoskeletal: Strength & Muscle Tone: within normal limits Gait & Station: normal Assets  Assets: Desire for Improvement; Communication Skills; Housing    Physical Exam: Physical Exam ROS Blood pressure (!) 141/88, pulse 65, temperature (!) 97.2 F (36.2 C), resp. rate 20, height 5\' 8"  (1.727 m), weight 94.3 kg, SpO2 100%. Body mass index is 31.63 kg/m.  Diagnosis: Principal Problem:   Alcohol use disorder, severe, dependence (HCC)   PLAN: Safety and Monitoring:  -- Voluntary admission to inpatient psychiatric unit for safety, stabilization and treatment  -- Daily contact with patient to assess and evaluate symptoms and progress in  treatment  -- Patient's case to be discussed in multi-disciplinary team meeting  --  Observation Level : q15 minute checks  -- Vital signs:  q12 hours  -- Precautions: suicide, elopement, and assault -- Encouraged patient to participate in unit milieu and in scheduled group therapies  2. Psychiatric Diagnoses and Treatment:      --LFT's slightly elevated however much improved from last admission    -- The risks/benefits/side-effects/alternatives to this medication were discussed in detail with the patient and time was given for questions. The patient consents to medication trial.                -- Metabolic profile and EKG monitoring obtained while on an atypical antipsychotic (BMI: Lipid Panel: HbgA1c: QTc:)              -- Encouraged patient to participate in unit milieu and in scheduled group therapies                   3. Medical Issues Being Addressed:  medication management and assessment to support safe alcohol detox     4. Discharge Planning:   -- Social work and case management to assist with discharge planning and identification of hospital follow-up needs prior to discharge  -- Estimated LOS: 3-4 days  Angelia Barcelona, NP 03/15/2024, 12:26 PM

## 2024-03-15 NOTE — Progress Notes (Signed)
   03/15/24 0849  Charting Type  Charting Type Shift assessment  Safety Check Verification  Has the RN verified the 15 minute safety check completion? Yes  Neurological  Neuro (WDL) X  Orientation Level Oriented X4  Cognition Follows commands  Speech Clear  Neuro Symptoms Anxiety (6/10)  Neuro symptoms relieved by Anti-anxiety medication  HEENT  HEENT (WDL) WDL  R Eye Eyeglasses  L Eye Edema  Respiratory  Respiratory (WDL) WDL  Cardiac  Cardiac (WDL) WDL  Vascular  Vascular (WDL) WDL  Integumentary  Integumentary (WDL) X  Skin Integrity Abrasion  Abrasion Location Head  Braden Scale (Ages 8 and up)  Sensory Perceptions 4  Moisture 4  Activity 4  Mobility 4  Nutrition 3  Friction and Shear 3  Braden Scale Score 22  Musculoskeletal  Musculoskeletal (WDL) WDL  Gastrointestinal  Gastrointestinal (WDL) WDL  Last BM Date  03/14/24  Neurological  Level of Consciousness Alert

## 2024-03-15 NOTE — Group Note (Signed)
 Recreation Therapy Group Note   Group Topic:General Recreation  Group Date: 03/15/2024 Start Time: 1515 End Time: 1620 Facilitators: Deatrice Factor, LRT, CTRS Location: Courtyard  Group Description: [employer]. LRT and patients played games of basketball, drew with chalk, and played corn hole while outside in the courtyard while getting fresh air and sunlight. Music was being played in the background. LRT and peers conversed about different games they have played before, what they do in their free time and anything else that is on their minds. LRT encouraged pts to drink water after being outside, sweating and getting their heart rate up.  Goal Area(s) Addressed: Patient will build on frustration tolerance skills. Patients will partake in a competitive play game with peers. Patients will gain knowledge of new leisure interest/hobby.    Affect/Mood: Appropriate   Participation Level: Minimal    Clinical Observations/Individualized Feedback: Brett Sanford came late to group and was present for 15 minutes.   Plan: Continue to engage patient in RT group sessions 2-3x/week.   Deatrice Factor, LRT, CTRS 03/15/2024 4:55 PM

## 2024-03-15 NOTE — Group Note (Signed)
 Date:  03/15/2024 Time:  8:42 PM  Group Topic/Focus:  Goals Group:   The focus of this group is to help patients establish daily goals to achieve during treatment and discuss how the patient can incorporate goal setting into their daily lives to aide in recovery. Recovery Goals:   The focus of this group is to identify appropriate goals for recovery and establish a plan to achieve them. Wellness Toolbox:   The focus of this group is to discuss various aspects of wellness, balancing those aspects and exploring ways to increase the ability to experience wellness.  Patients will create a wellness toolbox for use upon discharge.    Participation Level:  Active  Participation Quality:  Appropriate  Affect:  Appropriate  Cognitive:  Appropriate and Oriented  Insight: Appropriate  Engagement in Group:  Engaged  Modes of Intervention:  Activity, Discussion, and Support  Additional Comments:  N/A  Donalyn Schneeberger L 03/15/2024, 8:42 PM

## 2024-03-16 DIAGNOSIS — F1020 Alcohol dependence, uncomplicated: Secondary | ICD-10-CM | POA: Diagnosis not present

## 2024-03-16 MED ORDER — TRAZODONE HCL 50 MG PO TABS
50.0000 mg | ORAL_TABLET | Freq: Every evening | ORAL | Status: DC | PRN
  Administered 2024-03-16 – 2024-03-19 (×4): 50 mg via ORAL
  Filled 2024-03-16 (×4): qty 1

## 2024-03-16 NOTE — Group Note (Signed)
 Date:  03/16/2024 Time:  12:52 PM  Group Topic/Focus:  Healthy Communication:   The focus of this group is to discuss communication, barriers to communication, as well as healthy ways to communicate with others. Personal Choices and Values:   The focus of this group is to help patients assess and explore the importance of values in their lives, how their values affect their decisions, how they express their values and what opposes their expression.    Participation Level:  Active  Participation Quality:  Appropriate  Affect:  Appropriate  Cognitive:  Appropriate  Insight: Appropriate  Engagement in Group:  Engaged  Modes of Intervention:  Discussion  Additional Comments:    Brett Sanford [name] 03/16/2024, 12:52 PM

## 2024-03-16 NOTE — Plan of Care (Signed)
   Problem: Education: Goal: Emotional status will improve Outcome: Progressing Goal: Mental status will improve Outcome: Progressing

## 2024-03-16 NOTE — Group Note (Signed)
 Date:  03/16/2024 Time:  5:58 PM  Group Topic/Focus:  Coping With Mental Health Crisis:   The purpose of this group is to help patients identify strategies for coping with mental health crisis.  Group discusses possible causes of crisis and ways to manage them effectively.  The purpose of this group is to help the patients identify that going outside and exercise is a great coping mechanism with mental health crisis. Healthy communication and team building exercises outside is a very therapeutic and healthy way to relieve stress.   Participation Level:  Active  Participation Quality:  Appropriate  Affect:  Appropriate  Cognitive:  Appropriate  Insight: Appropriate  Engagement in Group:  Engaged  Modes of Intervention:  Discussion  Additional Comments:    Brett Sanford 03/16/2024, 5:58 PM

## 2024-03-16 NOTE — Progress Notes (Signed)
   03/16/24 1000  Psych Admission Type (Psych Patients Only)  Admission Status Voluntary  Psychosocial Assessment  Patient Complaints Anxiety  Eye Contact Watchful  Facial Expression Anxious  Affect Anxious  Speech Logical/coherent  Interaction Assertive  Motor Activity Slow  Appearance/Hygiene Unremarkable  Behavior Characteristics Cooperative  Mood Anxious  Aggressive Behavior  Effect No apparent injury  Thought Process  Coherency WDL  Content WDL  Delusions None reported or observed  Perception WDL  Hallucination None reported or observed  Judgment Poor  Confusion None  Danger to Self  Current suicidal ideation? Denies  Danger to Others  Danger to Others None reported or observed

## 2024-03-16 NOTE — Progress Notes (Signed)
 [facility] MD Progress Note  03/16/2024 4:00 PM Brett Sanford  MRN:  [id number]   Subjective:  Chart reviewed, case discussed in multidisciplinary meeting, patient seen during rounds.   Brett Sanford is a 33 year old male who presents to [facility] voluntarily seeking treatment for alcohol use disorder and suicidal ideation. Pt has past history of  SI, OD,  MDD, alcohol abuse and polysubstance abuse.   Per chart review, patient initially presented to [facility] reporting passive suicidal ideation in the context of recent relapse on alcohol.     Patient is seen today for follow-up.  He is found walking the unit he is pleasantly interacting with staff and peers.  He received PRNs overnight notably Zyprexa , trazodone , and Atarax .  He noted difficulty with sleep we will titrate trazodone .  He denies current symptoms of withdrawal.  Most recent CIWA's have been 2, 0, 0, 5.  Patient does not appear in any acute distress on exam.  He continues to tolerate Librium  taper.  Continues to note depressive symptoms but notes improvement.  He is future oriented and discusses a plan to follow-up with [facility].  He asked about medication for anxiety as he did not receive discussed that the propranolol  was as needed and EMS asked for the medication he verbalizes understanding.  Patient agrees to current plan.  Screenings remain negative for mania.  Continue CIWA.  Patient likely appropriate for discharge next week.    Appetite:  Good  Past Psychiatric History: see h&P Family History:  Family History  Family history unknown: Yes   Social History:  Social History   Substance and Sexual Activity  Alcohol Use Yes   Alcohol/week: 8.0 standard drinks of alcohol   Types: 8 Shots of liquor per week   Comment: a few shots/5-6 days/week     Social History   Substance and Sexual Activity  Drug Use Yes   Types: Marijuana   Comment: once a week    Social History   Socioeconomic History   Marital  status: Single    Spouse name: Not on file   Number of children: Not on file   Years of education: Not on file   Highest education level: Not on file  Occupational History   Not on file  Tobacco Use   Smoking status: Former    Current packs/day: 1.50    Average packs/day: 1.5 packs/day for 5.0 years (7.5 ttl pk-yrs)    Types: Cigarettes   Smokeless tobacco: Never   Tobacco comments:    1-2 packs per day/5 years  Vaping Use   Vaping status: Never Used  Substance and Sexual Activity   Alcohol use: Yes    Alcohol/week: 8.0 standard drinks of alcohol    Types: 8 Shots of liquor per week    Comment: a few shots/5-6 days/week   Drug use: Yes    Types: Marijuana    Comment: once a week   Sexual activity: Yes  Other Topics Concern   Not on file  Social History Narrative   Pt employed and living with mother who he identifies as his main social support. Pt has not taken his meds for the last 6 months. Pt has trouble getting medications because he does not have insurance.   Social Drivers of Corporate investment banker Strain: Low Risk  (09/29/2022)   Received from [facility], [facility]   Overall Financial Resource Strain (CARDIA)    Difficulty of Paying Living Expenses: Not very hard  Food Insecurity: No Food Insecurity (03/12/2024)   Hunger Vital Sign    Worried About Running Out of Food in the Last Year: Never true    Ran Out of Food in the Last Year: Never true  Transportation Needs: No Transportation Needs (03/12/2024)   PRAPARE - Administrator, Civil Service (Medical): No    Lack of Transportation (Non-Medical): No  Physical Activity: Sufficiently Active (09/29/2022)   Received from [facility], [facility]   Exercise Vital Sign    Days of Exercise per Week: 3 days    Minutes of Exercise per Session: 120 min  Stress: Stress Concern Present (09/29/2022)   Received from [facility], [facility]  - Occupational Stress Questionnaire    Feeling of Stress : To some extent  Social Connections: Socially Isolated (09/29/2022)   Received from [facility], [facility]   Social Connection and Isolation Panel [NHANES]    Frequency of Communication with Friends and Family: More than three times a week    Frequency of Social Gatherings with Friends and Family: More than three times a week    Attends Religious Services: Never    Database administrator or Organizations: No    Attends Engineer, structural: Never    Marital Status: Never married   Past Medical History:  Past Medical History:  Diagnosis Date   ADD (attention deficit disorder)    Drug abuse (HCC)    Medical history non-contributory     Past Surgical History:  Procedure Laterality Date   NO PAST SURGERIES      Current Medications: Current Facility-Administered Medications  Medication Dose Route Frequency Provider Last Rate Last Admin   acetaminophen  (TYLENOL ) tablet 650 mg  650 mg Oral Q6H PRN Olasunkanmi, Oluwatosin, NP   650 mg at 03/16/24 0841   alum & mag hydroxide-simeth (MAALOX/MYLANTA) 200-200-20 MG/5ML suspension 30 mL  30 mL Oral Q4H PRN Olasunkanmi, Oluwatosin, NP   30 mL at 03/13/24 1521   buPROPion  (WELLBUTRIN  XL) 24 hr tablet 150 mg  150 mg Oral Daily Angelia Barcelona, NP   150 mg at 03/16/24 0840   [START ON 03/17/2024] chlordiazePOXIDE  (LIBRIUM ) capsule 25 mg  25 mg Oral Daily Angelia Barcelona, NP       dicyclomine  (BENTYL ) tablet 20 mg  20 mg Oral Q6H PRN Olasunkanmi, Oluwatosin, NP   20 mg at 03/15/24 1623   hydrOXYzine  (ATARAX ) tablet 25 mg  25 mg Oral Q6H PRN Olasunkanmi, Oluwatosin, NP   25 mg at 03/16/24 0230   magnesium  hydroxide (MILK OF MAGNESIA) suspension 30 mL  30 mL Oral Daily PRN Olasunkanmi, Oluwatosin, NP       methocarbamol  (ROBAXIN ) tablet 500 mg  500 mg Oral Q8H PRN Olasunkanmi, Oluwatosin, NP   500 mg at 03/14/24 2127   multivitamin with minerals tablet 1 tablet  1 tablet  Oral Daily Olasunkanmi, Oluwatosin, NP   1 tablet at 03/16/24 0840   naproxen  (NAPROSYN ) tablet 500 mg  500 mg Oral BID PRN Olasunkanmi, Oluwatosin, NP   500 mg at 03/16/24 1258   OLANZapine  (ZYPREXA ) injection 10 mg  10 mg Intramuscular TID PRN Olasunkanmi, Oluwatosin, NP       OLANZapine  (ZYPREXA ) injection 5 mg  5 mg Intramuscular TID PRN Olasunkanmi, Oluwatosin, NP       OLANZapine  zydis (ZYPREXA ) disintegrating tablet 5 mg  5 mg Oral TID PRN Olasunkanmi, Oluwatosin, NP  5 mg at 03/16/24 0840   ondansetron  (ZOFRAN -ODT) disintegrating tablet 4 mg  4 mg Oral Q6H PRN Olasunkanmi, Oluwatosin, NP   4 mg at 03/15/24 1623   pantoprazole  (PROTONIX ) EC tablet 40 mg  40 mg Oral Daily Olasunkanmi, Oluwatosin, NP   40 mg at 03/16/24 0840   propranolol  (INDERAL ) tablet 10 mg  10 mg Oral Q6H PRN Angelia Barcelona, NP   10 mg at 03/16/24 4098   thiamine  (VITAMIN B1) tablet 100 mg  100 mg Oral Daily Olasunkanmi, Oluwatosin, NP   100 mg at 03/16/24 0840   traZODone  (DESYREL ) tablet 50 mg  50 mg Oral QHS PRN Fay Hoop, PA-C        Lab Results: No results found for this or any previous visit (from the past 48 hours).  Blood Alcohol level:  Lab Results  Component Value Date   ETH 195 (H) 03/11/2024   ETH 147 (H) 07/31/2023    Metabolic Disorder Labs: Lab Results  Component Value Date   HGBA1C 4.9 07/31/2023   MPG 93.93 07/31/2023   MPG 96.8 04/15/2020   No results found for: "PROLACTIN" Lab Results  Component Value Date   CHOL 317 (H) 07/31/2023   TRIG 241 (H) 07/31/2023   HDL 110 07/31/2023   CHOLHDL 2.9 07/31/2023   VLDL 48 (H) 07/31/2023   LDLCALC 159 (H) 07/31/2023   LDLCALC NOT CALCULATED 04/15/2020     Psychiatric Specialty Exam:  General Appearance:  Appropriate for Environment  Eye Contact: Fair  Speech: Normal Rate  Speech Volume: Normal  Mood: Depressed  Affect: Flat  Thought Processes: Linear  Descriptions of  Associations:Intact  Orientation:Full (Time, Place and Person)  Thought Content:Logical  Hallucinations:Hallucinations: None  Ideas of Reference:None  Suicidal Thoughts:Suicidal Thoughts: Yes, Passive SI Passive Intent and/or Plan: Without Intent  Homicidal Thoughts:Homicidal Thoughts: No   Sensorium  Memory: Immediate Good  Judgment: Fair  Insight: Fair   Art therapist  Concentration: Good  Attention Span: Good  Recall: Good  Fund of Knowledge: Good  Language: Good   Psychomotor Activity  Psychomotor Activity:Psychomotor Activity: Normal  Musculoskeletal: Strength & Muscle Tone: within normal limits Gait & Station: normal Assets  Assets: Desire for Improvement; Communication Skills    Physical Exam: Physical Exam Vitals and nursing note reviewed.  Constitutional:      Appearance: He is obese.  HENT:     Head: Atraumatic.  Eyes:     Extraocular Movements: Extraocular movements intact.  Pulmonary:     Effort: Pulmonary effort is normal.  Neurological:     Mental Status: He is alert and oriented to person, place, and time.    Review of Systems  Neurological:  Negative for tremors.  Psychiatric/Behavioral:  Positive for depression and suicidal ideas. Negative for hallucinations and substance abuse. The patient is nervous/anxious.    Blood pressure 120/84, pulse 95, temperature 97.7 F (36.5 C), resp. rate 20, height 5\' 8"  (1.727 m), weight 94.3 kg, SpO2 99%. Body mass index is 31.63 kg/m.  Diagnosis: Principal Problem:   Alcohol use disorder, severe, dependence (HCC)   PLAN: Safety and Monitoring:  -- Voluntary admission to inpatient psychiatric unit for safety, stabilization and treatment  -- Daily contact with patient to assess and evaluate symptoms and progress in treatment  -- Patient's case to be discussed in multi-disciplinary team meeting  -- Observation Level : q15 minute checks  -- Vital signs:  q12 hours  --  Precautions: suicide, elopement, and assault -- Encouraged patient to  participate in unit milieu and in scheduled group therapies  2. Psychiatric Diagnoses and Treatment:      Alcohol use disorder, severe, dependence (HCC)    Major Depressive Disorder, chronic moderate wo psychotic features-continue Wellbutrin  XL 150mg  po daily               Generalized Anxiety Disorder-continue propranolol  10mg  po Q6h prn anxiety     Insomnia-titrate trazodone  to 50 mg nightly risk benefits and potential side effects reviewed patient verbalized understanding is agreeable with plan.   3. Medical Issues Being Addressed: medication management and assessment to support safe alcohol detox   4. Discharge Planning:   -- Social work and case management to assist with discharge planning and identification of hospital follow-up needs prior to discharge  -- Discharge expected likely next week will require appropriate follow-up.  Is interested in [facility]. Fay Hoop, PA-C 03/16/2024, 4:00 PM

## 2024-03-16 NOTE — Group Note (Signed)
 Date:  03/16/2024 Time:  1:08 PM  Group Topic/Focus:  Coping With Mental Health Crisis:   The purpose of this group is to help patients identify strategies for coping with mental health crisis.  Group discusses possible causes of crisis and ways to manage them effectively. Healthy Communication:   The focus of this group is to discuss communication, barriers to communication, as well as healthy ways to communicate with others.    Participation Level:  Active  Participation Quality:  Appropriate  Affect:  Appropriate  Cognitive:  Appropriate  Insight: Appropriate  Engagement in Group:  Engaged  Modes of Intervention:  Discussion  Additional Comments:    Giorgia Wahler L Jeral Zick 03/16/2024, 1:08 PM

## 2024-03-16 NOTE — Plan of Care (Signed)
   Problem: Education: Goal: Emotional status will improve Outcome: Progressing   Problem: Education: Goal: Mental status will improve Outcome: Progressing

## 2024-03-16 NOTE — Group Note (Signed)
 Date:  03/16/2024 Time:  8:41 PM  Group Topic/Focus:  Wrap-Up Group:   The focus of this group is to help patients review their daily goal of treatment and discuss progress on daily workbooks.    Participation Level:  Active  Participation Quality:  Appropriate and Attentive  Affect:  Appropriate  Cognitive:  Alert and Appropriate  Insight: Appropriate, Good, and Improving  Engagement in Group:  Developing/Improving and Engaged  Modes of Intervention:  Discussion, Rapport Building, Socialization, and Support  Additional Comments:     Brett Sanford 03/16/2024, 8:41 PM

## 2024-03-17 DIAGNOSIS — F1020 Alcohol dependence, uncomplicated: Secondary | ICD-10-CM | POA: Diagnosis not present

## 2024-03-17 MED ORDER — IBUPROFEN 200 MG PO TABS
400.0000 mg | ORAL_TABLET | Freq: Four times a day (QID) | ORAL | Status: DC | PRN
  Administered 2024-03-17 – 2024-03-19 (×5): 400 mg via ORAL
  Filled 2024-03-17 (×5): qty 2

## 2024-03-17 MED ORDER — DICYCLOMINE HCL 20 MG PO TABS
20.0000 mg | ORAL_TABLET | Freq: Four times a day (QID) | ORAL | Status: DC | PRN
  Administered 2024-03-17: 20 mg via ORAL
  Filled 2024-03-17: qty 1

## 2024-03-17 MED ORDER — HYDROXYZINE HCL 25 MG PO TABS
25.0000 mg | ORAL_TABLET | Freq: Four times a day (QID) | ORAL | Status: DC | PRN
  Administered 2024-03-17: 25 mg via ORAL
  Filled 2024-03-17 (×2): qty 1

## 2024-03-17 MED ORDER — CHLORDIAZEPOXIDE HCL 25 MG PO CAPS
25.0000 mg | ORAL_CAPSULE | Freq: Every day | ORAL | Status: AC
Start: 2024-03-18 — End: 2024-03-19
  Administered 2024-03-18: 25 mg via ORAL
  Filled 2024-03-17: qty 1

## 2024-03-17 MED ORDER — ONDANSETRON 4 MG PO TBDP: 4.0000 mg | ORAL_TABLET | Freq: Four times a day (QID) | ORAL | Status: DC | PRN

## 2024-03-17 NOTE — Group Note (Signed)
 Date:  03/17/2024 Time:  11:50 AM  Group Topic/Focus:  Goals Group:   The focus of this group is to help patients establish daily goals to achieve during treatment and discuss how the patient can incorporate goal setting into their daily lives to aide in recovery.    Participation Level:    Participation Quality:  Appropriate  Affect:  Appropriate  Cognitive:  Appropriate  Insight: Appropriate  Engagement in Group:  Engaged  Modes of Intervention:  Activity  Additional Comments:    Khoa Opdahl 03/17/2024, 11:50 AM

## 2024-03-17 NOTE — Plan of Care (Signed)
  Problem: Education: Goal: Verbalization of understanding the information provided will improve Outcome: Progressing   Problem: Activity: Goal: Interest or engagement in activities will improve Outcome: Progressing   Problem: Education: Goal: Mental status will improve Outcome: Progressing

## 2024-03-17 NOTE — Progress Notes (Signed)
   03/16/24 2100  Psych Admission Type (Psych Patients Only)  Admission Status Voluntary  Psychosocial Assessment  Patient Complaints Anxiety;Irritability;Restlessness  Eye Contact Watchful  Facial Expression Anxious  Affect Apathetic;Irritable  Speech Logical/coherent  Interaction Assertive  Motor Activity Slow  Appearance/Hygiene Improved  Behavior Characteristics Cooperative  Mood Anxious  Thought Process  Coherency WDL  Content WDL  Delusions None reported or observed  Perception WDL  Hallucination None reported or observed  Judgment Poor  Confusion None  Danger to Self  Current suicidal ideation? Denies  Danger to Others  Danger to Others None reported or observed   Patient is alert and oriented x 4, he denies SI/HI/AVH,he was irritable during medication pass because he requested another dose of librium , and writer advised him his next dose was scheduled for tomorrow morning but he was not receptive and was not upset. 15 minutes safety checks maintained will continue to monitor.

## 2024-03-17 NOTE — Progress Notes (Deleted)
   03/17/24 0905  Psych Admission Type (Psych Patients Only)  Admission Status Voluntary  Psychosocial Assessment  Patient Complaints Irritability;Depression;Anxiety  Eye Contact Fair  Facial Expression Flat  Affect Flat  Speech Logical/coherent  Interaction Assertive  Motor Activity Slow  Appearance/Hygiene Improved  Behavior Characteristics Cooperative  Mood Anxious  Thought Process  Coherency WDL  Content WDL  Delusions None reported or observed  Perception WDL  Hallucination None reported or observed  Judgment Poor  Confusion WDL  Danger to Self  Current suicidal ideation? Denies  Self-Injurious Behavior No self-injurious ideation or behavior indicators observed or expressed   Agreement Not to Harm Self Yes  Description of Agreement verbal  Danger to Others  Danger to Others None reported or observed

## 2024-03-17 NOTE — Group Note (Signed)
 Date:  03/17/2024 Time:  4:59 PM  Group Topic/Focus:  Activity Group: The focus of the group is to promote activity for the patients and encourage them to go outside to the courtyard and let them get some fresh air and exercise.    Participation Level:  Active  Participation Quality:  Appropriate  Affect:  Appropriate  Cognitive:  Appropriate  Insight: Appropriate  Engagement in Group:  Engaged  Modes of Intervention:  Activity  Additional Comments:    Brett Sanford 03/17/2024, 4:59 PM

## 2024-03-17 NOTE — Group Note (Signed)
 Date:  03/17/2024 Time:  9:01 PM  Group Topic/Focus:  Wrap-Up Group:   The focus of this group is to help patients review their daily goal of treatment and discuss progress on daily workbooks.    Participation Level:  Active  Participation Quality:  Appropriate  Affect:  Appropriate  Cognitive:  Appropriate  Insight: Appropriate  Engagement in Group:  Engaged  Modes of Intervention:  Education and Exploration  Additional Comments:  Patient attended and participated in group tonight. He reports that his goal for today was to not get upset. For the most part. He did kept himself under control.  Brett Sanford 03/17/2024, 9:01 PM

## 2024-03-17 NOTE — Plan of Care (Signed)
  Problem: Education: Goal: Emotional status will improve Outcome: Progressing Goal: Mental status will improve Outcome: Progressing Goal: Verbalization of understanding the information provided will improve Outcome: Progressing   Problem: Activity: Goal: Interest or engagement in activities will improve Outcome: Progressing Goal: Sleeping patterns will improve Outcome: Progressing   Problem: Coping: Goal: Ability to verbalize frustrations and anger appropriately will improve Outcome: Progressing Goal: Ability to demonstrate self-control will improve Outcome: Progressing   Problem: Health Behavior/Discharge Planning: Goal: Compliance with treatment plan for underlying cause of condition will improve Outcome: Progressing   Patient's goal for today is to, "Stay positive." Patient states he has been experiencing increased irritability over the last few days

## 2024-03-17 NOTE — Progress Notes (Signed)
   03/17/24 0905  Psych Admission Type (Psych Patients Only)  Admission Status Voluntary  Psychosocial Assessment  Patient Complaints Irritability;Depression;Anxiety  Eye Contact Fair  Facial Expression Flat  Affect Flat  Speech Logical/coherent  Interaction Assertive  Motor Activity Slow  Appearance/Hygiene Improved  Behavior Characteristics Cooperative  Mood Anxious  Thought Process  Coherency WDL  Content WDL  Delusions None reported or observed  Perception WDL  Hallucination None reported or observed  Judgment Poor  Confusion None  Danger to Self  Current suicidal ideation? Denies  Self-Injurious Behavior No self-injurious ideation or behavior indicators observed or expressed   Agreement Not to Harm Self Yes  Description of Agreement verbal  Danger to Others  Danger to Others None reported or observed

## 2024-03-17 NOTE — Progress Notes (Signed)
 [facility] MD Progress Note  03/17/2024 3:00 PM Brett Sanford  MRN:  [id number]   Subjective:  Chart reviewed, case discussed in multidisciplinary meeting, patient seen during rounds.   Brett Sanford is a 33 year old male who presents to [facility] voluntarily seeking treatment for alcohol use disorder and suicidal ideation. Pt has past history of  SI, OD,  MDD, alcohol abuse and polysubstance abuse.   Per chart review, patient initially presented to [facility] reporting passive suicidal ideation in the context of recent relapse on alcohol.    6/8: Patient is seen today for follow-up.  He is found walking the unit.  He is interacting with staff and peers appropriately.  He received as needed propranolol  and trazodone  overnight.  He notes some frustration over not receiving a nighttime dose of Librium  last night.  He notes ongoing symptoms of withdrawal including irritability, anxiety, HTN, and slight tremor.  Discussed we will extend Librium  taper 1 dose.  He denies adverse effects of medication.  Most recent CIWAs 3, 0, 1, and 2.  He denies SI, HI, and AVH.  Continues to demonstrate good insight and need for outpatient follow-up and medication compliance.  Continue CIWA.  Patient likely appropriate for discharge early in the week.  6/7:Patient is seen today for follow-up.  He is found walking the unit he is pleasantly interacting with staff and peers.  He received PRNs overnight notably Zyprexa , trazodone , and Atarax .  He noted difficulty with sleep we will titrate trazodone .  He denies current symptoms of withdrawal.  Most recent CIWA's have been 2, 0, 0, 5.  Patient does not appear in any acute distress on exam.  He continues to tolerate Librium  taper.  Continues to note depressive symptoms but notes improvement.  He is future oriented and discusses a plan to follow-up with [facility].  He asked about medication for anxiety as he did not receive discussed that the propranolol  was as needed and  needs to ask for the medication he verbalizes understanding.  Patient agrees to current plan.  Screenings remain negative for mania.  Continue CIWA.  Patient likely appropriate for discharge next week.    Appetite:  Good  Past Psychiatric History: see h&P Family History:  Family History  Family history unknown: Yes   Social History:  Social History   Substance and Sexual Activity  Alcohol Use Yes   Alcohol/week: 8.0 standard drinks of alcohol   Types: 8 Shots of liquor per week   Comment: a few shots/5-6 days/week     Social History   Substance and Sexual Activity  Drug Use Yes   Types: Marijuana   Comment: once a week    Social History   Socioeconomic History   Marital status: Single    Spouse name: Not on file   Number of children: Not on file   Years of education: Not on file   Highest education level: Not on file  Occupational History   Not on file  Tobacco Use   Smoking status: Former    Current packs/day: 1.50    Average packs/day: 1.5 packs/day for 5.0 years (7.5 ttl pk-yrs)    Types: Cigarettes   Smokeless tobacco: Never   Tobacco comments:    1-2 packs per day/5 years  Vaping Use   Vaping status: Never Used  Substance and Sexual Activity   Alcohol use: Yes    Alcohol/week: 8.0 standard drinks of alcohol    Types: 8 Shots of liquor per week  Comment: a few shots/5-6 days/week   Drug use: Yes    Types: Marijuana    Comment: once a week   Sexual activity: Yes  Other Topics Concern   Not on file  Social History Narrative   Pt employed and living with mother who he identifies as his main social support. Pt has not taken his meds for the last 6 months. Pt has trouble getting medications because he does not have insurance.   Social Drivers of Corporate investment banker Strain: Low Risk  (09/29/2022)   Received from [facility], [facility]   Overall Financial Resource Strain (CARDIA)    Difficulty of Paying Living Expenses: Not very hard   Food Insecurity: No Food Insecurity (03/12/2024)   Hunger Vital Sign    Worried About Running Out of Food in the Last Year: Never true    Ran Out of Food in the Last Year: Never true  Transportation Needs: No Transportation Needs (03/12/2024)   PRAPARE - Administrator, Civil Service (Medical): No    Lack of Transportation (Non-Medical): No  Physical Activity: Sufficiently Active (09/29/2022)   Received from [facility], [facility]   Exercise Vital Sign    Days of Exercise per Week: 3 days    Minutes of Exercise per Session: 120 min  Stress: Stress Concern Present (09/29/2022)   Received from [facility], [facility] of Occupational Health - Occupational Stress Questionnaire    Feeling of Stress : To some extent  Social Connections: Socially Isolated (09/29/2022)   Received from [facility], [facility]   Social Connection and Isolation Panel [NHANES]    Frequency of Communication with Friends and Family: More than three times a week    Frequency of Social Gatherings with Friends and Family: More than three times a week    Attends Religious Services: Never    Database administrator or Organizations: No    Attends Engineer, structural: Never    Marital Status: Never married   Past Medical History:  Past Medical History:  Diagnosis Date   ADD (attention deficit disorder)    Drug abuse (HCC)    Medical history non-contributory     Past Surgical History:  Procedure Laterality Date   NO PAST SURGERIES      Current Medications: Current Facility-Administered Medications  Medication Dose Route Frequency Provider Last Rate Last Admin   acetaminophen  (TYLENOL ) tablet 650 mg  650 mg Oral Q6H PRN Olasunkanmi, Oluwatosin, NP   650 mg at 03/17/24 0847   alum & mag hydroxide-simeth (MAALOX/MYLANTA) 200-200-20 MG/5ML suspension 30 mL  30 mL Oral Q4H PRN Olasunkanmi, Oluwatosin, NP   30 mL at 03/13/24 1521   buPROPion   (WELLBUTRIN  XL) 24 hr tablet 150 mg  150 mg Oral Daily Angelia Barcelona, NP   150 mg at 03/17/24 0846   [START ON 03/18/2024] chlordiazePOXIDE  (LIBRIUM ) capsule 25 mg  25 mg Oral QAC breakfast Berlie Persky E, PA-C       dicyclomine  (BENTYL ) tablet 20 mg  20 mg Oral Q6H PRN Cuauhtemoc Huegel E, PA-C       hydrOXYzine  (ATARAX ) tablet 25 mg  25 mg Oral Q6H PRN Honesty Menta E, PA-C       ibuprofen  (ADVIL ) tablet 400 mg  400 mg Oral Q6H PRN Bertram Haddix E, PA-C   400 mg at 03/17/24 1036   magnesium  hydroxide (MILK OF MAGNESIA) suspension  30 mL  30 mL Oral Daily PRN Olasunkanmi, Oluwatosin, NP       multivitamin with minerals tablet 1 tablet  1 tablet Oral Daily Olasunkanmi, Oluwatosin, NP   1 tablet at 03/17/24 0846   OLANZapine  (ZYPREXA ) injection 10 mg  10 mg Intramuscular TID PRN Olasunkanmi, Oluwatosin, NP       OLANZapine  (ZYPREXA ) injection 5 mg  5 mg Intramuscular TID PRN Olasunkanmi, Oluwatosin, NP       OLANZapine  zydis (ZYPREXA ) disintegrating tablet 5 mg  5 mg Oral TID PRN Olasunkanmi, Oluwatosin, NP   5 mg at 03/16/24 0840   ondansetron  (ZOFRAN -ODT) disintegrating tablet 4 mg  4 mg Oral Q6H PRN Georgio Hattabaugh E, PA-C       pantoprazole  (PROTONIX ) EC tablet 40 mg  40 mg Oral Daily Olasunkanmi, Oluwatosin, NP   40 mg at 03/17/24 0846   propranolol  (INDERAL ) tablet 10 mg  10 mg Oral Q6H PRN Angelia Barcelona, NP   10 mg at 03/17/24 0850   thiamine  (VITAMIN B1) tablet 100 mg  100 mg Oral Daily Olasunkanmi, Oluwatosin, NP   100 mg at 03/17/24 0846   traZODone  (DESYREL ) tablet 50 mg  50 mg Oral QHS PRN Dasani Thurlow E, PA-C   50 mg at 03/16/24 2243    Lab Results: No results found for this or any previous visit (from the past 48 hours).  Blood Alcohol level:  Lab Results  Component Value Date   ETH 195 (H) 03/11/2024   ETH 147 (H) 07/31/2023    Metabolic Disorder Labs: Lab Results  Component Value Date   HGBA1C 4.9 07/31/2023   MPG 93.93 07/31/2023    MPG 96.8 04/15/2020   No results found for: "PROLACTIN" Lab Results  Component Value Date   CHOL 317 (H) 07/31/2023   TRIG 241 (H) 07/31/2023   HDL 110 07/31/2023   CHOLHDL 2.9 07/31/2023   VLDL 48 (H) 07/31/2023   LDLCALC 159 (H) 07/31/2023   LDLCALC NOT CALCULATED 04/15/2020     Psychiatric Specialty Exam:  General Appearance:  Casual  Eye Contact: Fair  Speech: Clear and Coherent  Speech Volume: Normal  Mood: Euthymic  Affect: Congruent  Thought Processes: Coherent  Descriptions of Associations:Intact  Orientation:Full (Time, Place and Person)  Thought Content:Logical  Hallucinations:Hallucinations: None  Ideas of Reference:None  Suicidal Thoughts:Suicidal Thoughts: Yes, Passive  Homicidal Thoughts:Homicidal Thoughts: No   Sensorium  Memory: Immediate Fair; Recent Fair  Judgment: Fair  Insight: Fair   Art therapist  Concentration: Fair  Attention Span: Good  Recall: Good  Fund of Knowledge: Good  Language: Good   Psychomotor Activity  Psychomotor Activity:Psychomotor Activity: Normal  Musculoskeletal: Strength & Muscle Tone: within normal limits Gait & Station: normal Assets  Assets: Manufacturing systems engineer; Desire for Improvement    Physical Exam: Physical Exam Vitals and nursing note reviewed.  Constitutional:      Appearance: He is obese.  HENT:     Head: Atraumatic.  Eyes:     Extraocular Movements: Extraocular movements intact.  Pulmonary:     Effort: Pulmonary effort is normal.  Neurological:     Mental Status: He is alert and oriented to person, place, and time.    Review of Systems  Neurological:  Positive for tremors.  Psychiatric/Behavioral:  Positive for depression. Negative for hallucinations, substance abuse and suicidal ideas. The patient is nervous/anxious.    Blood pressure (!) 150/99, pulse 72, temperature (!) 97.2 F (36.2 C), resp. rate 17, height 5\' 8"  (1.727 m), weight  94.3 kg,  SpO2 98%. Body mass index is 31.63 kg/m.  Diagnosis: Principal Problem:   Alcohol use disorder, severe, dependence (HCC)   PLAN: Safety and Monitoring:  -- Voluntary admission to inpatient psychiatric unit for safety, stabilization and treatment  -- Daily contact with patient to assess and evaluate symptoms and progress in treatment  -- Patient's case to be discussed in multi-disciplinary team meeting  -- Observation Level : q15 minute checks  -- Vital signs:  q12 hours  -- Precautions: suicide, elopement, and assault -- Encouraged patient to participate in unit milieu and in scheduled group therapies  2. Psychiatric Diagnoses and Treatment:      Alcohol use disorder, severe, dependence (HCC) Extended CIWA and Librium  taper by 1 dose.    Major Depressive Disorder, chronic moderate wo psychotic features-continue Wellbutrin  XL 150mg  po daily               Generalized Anxiety Disorder-continue propranolol  10mg  po Q6h prn anxiety     Insomnia-continue trazodone  to 50 mg nightly risk benefits and potential side effects reviewed patient verbalized understanding is agreeable with plan.   3. Medical Issues Being Addressed: medication management and assessment to support safe alcohol detox   4. Discharge Planning:   -- Social work and case management to assist with discharge planning and identification of hospital follow-up needs prior to discharge  -- Discharge expected likely Tuesday Wednesday will require appropriate follow-up.  Is interested in [facility]. Fay Hoop, PA-C 03/17/2024, 3:00 PM

## 2024-03-17 NOTE — Progress Notes (Signed)
   03/17/24 1800  CIWA-Ar  Nausea and Vomiting 0  Tactile Disturbances 0  Tremor 1  Auditory Disturbances 0  Paroxysmal Sweats 0  Visual Disturbances 0  Anxiety 1  Headache, Fullness in Head 2  Agitation 0  Orientation and Clouding of Sensorium 0  CIWA-Ar Total 4

## 2024-03-18 NOTE — Progress Notes (Signed)
   03/18/24 0946  Psych Admission Type (Psych Patients Only)  Admission Status Voluntary  Psychosocial Assessment  Patient Complaints Anxiety;Depression;Other (Comment) (pain)  Eye Contact Fair  Facial Expression Flat  Affect Flat  Speech Logical/coherent  Interaction Assertive  Motor Activity Slow  Appearance/Hygiene Improved  Behavior Characteristics Cooperative  Mood Pleasant  Thought Process  Coherency WDL  Content WDL  Delusions None reported or observed  Perception WDL  Hallucination None reported or observed  Judgment Poor  Confusion None  Danger to Self  Current suicidal ideation? Denies  Self-Injurious Behavior No self-injurious ideation or behavior indicators observed or expressed   Agreement Not to Harm Self Yes  Description of Agreement verbal  Danger to Others  Danger to Others None reported or observed

## 2024-03-18 NOTE — Plan of Care (Signed)
   Problem: Education: Goal: Emotional status will improve Outcome: Progressing Goal: Mental status will improve Outcome: Progressing Goal: Verbalization of understanding the information provided will improve Outcome: Progressing   Problem: Activity: Goal: Interest or engagement in activities will improve Outcome: Progressing

## 2024-03-18 NOTE — Plan of Care (Signed)

## 2024-03-18 NOTE — BH IP Treatment Plan (Signed)
 Interdisciplinary Treatment and Diagnostic Plan Update  03/18/2024 Time of Session: 9:00 AM Brett Sanford MRN: [id number]  Principal Diagnosis: Alcohol use disorder, severe, dependence (HCC)  Secondary Diagnoses: Principal Problem:   Alcohol use disorder, severe, dependence (HCC)   Current Medications:  Current Facility-Administered Medications  Medication Dose Route Frequency Provider Last Rate Last Admin   acetaminophen  (TYLENOL ) tablet 650 mg  650 mg Oral Q6H PRN Olasunkanmi, Oluwatosin, NP   650 mg at 03/17/24 0847   alum & mag hydroxide-simeth (MAALOX/MYLANTA) 200-200-20 MG/5ML suspension 30 mL  30 mL Oral Q4H PRN Olasunkanmi, Oluwatosin, NP   30 mL at 03/13/24 1521   buPROPion  (WELLBUTRIN  XL) 24 hr tablet 150 mg  150 mg Oral Daily Angelia Barcelona, NP   150 mg at 03/18/24 8295   dicyclomine  (BENTYL ) tablet 20 mg  20 mg Oral Q6H PRN Millington, Matthew E, PA-C   20 mg at 03/17/24 2242   hydrOXYzine  (ATARAX ) tablet 25 mg  25 mg Oral Q6H PRN Millington, Matthew E, PA-C   25 mg at 03/17/24 1946   ibuprofen  (ADVIL ) tablet 400 mg  400 mg Oral Q6H PRN Millington, Matthew E, PA-C   400 mg at 03/18/24 6213   magnesium  hydroxide (MILK OF MAGNESIA) suspension 30 mL  30 mL Oral Daily PRN Olasunkanmi, Oluwatosin, NP       multivitamin with minerals tablet 1 tablet  1 tablet Oral Daily Olasunkanmi, Oluwatosin, NP   1 tablet at 03/18/24 0865   OLANZapine  (ZYPREXA ) injection 10 mg  10 mg Intramuscular TID PRN Olasunkanmi, Oluwatosin, NP       OLANZapine  (ZYPREXA ) injection 5 mg  5 mg Intramuscular TID PRN Olasunkanmi, Oluwatosin, NP       OLANZapine  zydis (ZYPREXA ) disintegrating tablet 5 mg  5 mg Oral TID PRN Olasunkanmi, Oluwatosin, NP   5 mg at 03/16/24 0840   ondansetron  (ZOFRAN -ODT) disintegrating tablet 4 mg  4 mg Oral Q6H PRN Millington, Matthew E, PA-C       pantoprazole  (PROTONIX ) EC tablet 40 mg  40 mg Oral Daily Olasunkanmi, Oluwatosin, NP   40 mg at 03/18/24 0839   propranolol   (INDERAL ) tablet 10 mg  10 mg Oral Q6H PRN Angelia Barcelona, NP   10 mg at 03/18/24 7846   thiamine  (VITAMIN B1) tablet 100 mg  100 mg Oral Daily Olasunkanmi, Oluwatosin, NP   100 mg at 03/18/24 9629   traZODone  (DESYREL ) tablet 50 mg  50 mg Oral QHS PRN Millington, Matthew E, PA-C   50 mg at 03/17/24 2243   PTA Medications: Medications Prior to Admission  Medication Sig Dispense Refill Last Dose/Taking   acetaminophen  (TYLENOL ) 500 MG tablet Take 1,000 mg by mouth every 6 (six) hours as needed for headache.      omeprazole (PRILOSEC OTC) 20 MG tablet Take 20 mg by mouth daily.      ondansetron  (ZOFRAN -ODT) 4 MG disintegrating tablet Take 1 tablet (4 mg total) by mouth every 8 (eight) hours as needed for nausea or vomiting. 20 tablet 0    traZODone  (DESYREL ) 50 MG tablet Take 1 tablet (50 mg total) by mouth at bedtime as needed for sleep. 30 tablet 0     Patient Stressors: Loss of his aunt   Substance abuse    Patient Strengths: Ability for insight  Education administrator  Motivation for treatment/growth  Supportive family/friends   Treatment Modalities: Medication Management, Group therapy, Case management,  1 to 1 session with clinician, Psychoeducation, Recreational therapy.  Physician Treatment Plan for Primary Diagnosis: Alcohol use disorder, severe, dependence (HCC) Long Term Goal(s): Improvement in symptoms so as ready for discharge   Short Term Goals: Ability to identify changes in lifestyle to reduce recurrence of condition will improve  Medication Management: Evaluate patient's response, side effects, and tolerance of medication regimen.  Therapeutic Interventions: 1 to 1 sessions, Unit Group sessions and Medication administration.  Evaluation of Outcomes: Not Met  Physician Treatment Plan for Secondary Diagnosis: Principal Problem:   Alcohol use disorder, severe, dependence (HCC)  Long Term Goal(s): Improvement in symptoms so as ready for discharge    Short Term Goals: Ability to identify changes in lifestyle to reduce recurrence of condition will improve     Medication Management: Evaluate patient's response, side effects, and tolerance of medication regimen.  Therapeutic Interventions: 1 to 1 sessions, Unit Group sessions and Medication administration.  Evaluation of Outcomes: Not Met   RN Treatment Plan for Primary Diagnosis: Alcohol use disorder, severe, dependence (HCC) Long Term Goal(s): Knowledge of disease and therapeutic regimen to maintain health will improve  Short Term Goals: Ability to remain free from injury will improve, Ability to verbalize frustration and anger appropriately will improve, Ability to demonstrate self-control, Ability to participate in decision making will improve, Ability to verbalize feelings will improve, Ability to disclose and discuss suicidal ideas, Ability to identify and develop effective coping behaviors will improve, and Compliance with prescribed medications will improve   Medication Management: RN will administer medications as ordered by provider, will assess and evaluate patient's response and provide education to patient for prescribed medication. RN will report any adverse and/or side effects to prescribing provider.  Therapeutic Interventions: 1 on 1 counseling sessions, Psychoeducation, Medication administration, Evaluate responses to treatment, Monitor vital signs and CBGs as ordered, Perform/monitor CIWA, COWS, AIMS and Fall Risk screenings as ordered, Perform wound care treatments as ordered.  Evaluation of Outcomes: Not Met   LCSW Treatment Plan for Primary Diagnosis: Alcohol use disorder, severe, dependence (HCC) Long Term Goal(s): Safe transition to appropriate next level of care at discharge, Engage patient in therapeutic group addressing interpersonal concerns.  Short Term Goals: Engage patient in aftercare planning with referrals and resources, Increase social support, Increase  ability to appropriately verbalize feelings, Increase emotional regulation, Facilitate acceptance of mental health diagnosis and concerns, Facilitate patient progression through stages of change regarding substance use diagnoses and concerns, Identify triggers associated with mental health/substance abuse issues, and Increase skills for wellness and recovery   Therapeutic Interventions: Assess for all discharge needs, 1 to 1 time with Social worker, Explore available resources and support systems, Assess for adequacy in community support network, Educate family and significant other(s) on suicide prevention, Complete Psychosocial Assessment, Interpersonal group therapy.  Evaluation of Outcomes: Not Met   Progress in Treatment: Attending groups: Yes. 03/18/24 Update: Yes.   Participating in groups: Yes. 03/18/24 Update: Yes.   Taking medication as prescribed: Yes. 03/18/24 Update: Yes.   Toleration medication: Yes. 03/18/24 Update: Yes.   Family/Significant other contact made: No, will contact:  if given permission. 03/18/24 Update: Patient Refusal for Family/Significant Other Suicide Prevention Education: The patient Brett Sanford has refused to provide written consent for family/significant other to be provided Family/Significant Other Suicide Prevention Education during admission and/or prior to discharge.  Physician notified.   SPE completed with pt, as pt refused to consent to family contact. SPI pamphlet provided to pt and pt was encouraged to share information with support network, ask questions, and talk about  any concerns relating to SPE. Pt denies access to guns/firearms and verbalized understanding of information provided. Mobile Crisis information also provided to pt. Patient understands diagnosis: Yes. Discussing patient identified problems/goals with staff: Yes. 03/18/24 Update: Yes.   Medical problems stabilized or resolved: Yes. 03/18/24 Update: Yes.   Denies suicidal/homicidal  ideation: Yes. 03/18/24 Update: Yes.   Issues/concerns per patient self-inventory: No. 03/18/24 Update: No.  Other: none. 03/18/24 Update: None.     New problem(s) identified: No, Describe:  none identified. 03/18/24 Update: o, Describe:  none identified.    New Short Term/Long Term Goal(s): detox, medication management for mood stabilization; elimination of SI thoughts; development of comprehensive mental wellness/sobriety plan. 03/18/24 Update: Goal to remain the same.      Patient Goals:  "To get stable before returning to outpatient." 03/18/24 Update: Patient goal to remain the same.    Discharge Plan or Barriers: CSW will assist pt with development of an appropriate aftercare/discharge plan. 03/18/24 Update:  CSW goal to remain the same.    Reason for Continuation of Hospitalization: Depression Medication stabilization Suicidal ideation Withdrawal symptoms   Estimated Length of Stay: 1-7 days 03/18/24 Update: Pending 03/20/24 or TBD  Last 3 [place] Suicide Severity Risk Score: Flowsheet Row Admission (Current) from 03/12/2024 in [facility] ED from 03/11/2024 in [facility] from 02/28/2024 in [facility]  C-SSRS RISK CATEGORY High Risk High Risk No Risk       Last PHQ 2/9 Scores:    06/18/2019    1:27 PM  Depression screen PHQ 2/9  Decreased Interest 1  Down, Depressed, Hopeless 1  PHQ - 2 Score 2  Altered sleeping 1  Tired, decreased energy 1  Change in appetite 2  Feeling bad or failure about yourself  1  Trouble concentrating 0  Moving slowly or fidgety/restless 2  Suicidal thoughts 1  PHQ-9 Score 10  Difficult doing work/chores Somewhat difficult    Scribe for Treatment Team: Roselle Conner, LCSW 03/18/2024 2:21 PM

## 2024-03-18 NOTE — Group Note (Signed)
 Date:  03/18/2024 Time:  11:44 AM  Group Topic/Focus:  Wellness Toolbox:   The focus of this group is to discuss various aspects of wellness, balancing those aspects and exploring ways to increase the ability to experience wellness.  Patients will create a wellness toolbox for use upon discharge.    Participation Level:  Active  Participation Quality:  Appropriate  Affect:  Appropriate  Cognitive:  Appropriate  Insight: Appropriate  Engagement in Group:  Engaged  Modes of Intervention:  Activity  Additional Comments:    Marianna Shirk Riya Huxford 03/18/2024, 11:44 AM

## 2024-03-18 NOTE — Progress Notes (Signed)
   03/18/24 2000  Psych Admission Type (Psych Patients Only)  Admission Status Voluntary  Psychosocial Assessment  Patient Complaints None  Eye Contact Fair  Facial Expression Flat  Affect Flat  Speech Logical/coherent  Interaction Assertive  Motor Activity Slow  Appearance/Hygiene Improved  Behavior Characteristics Cooperative;Appropriate to situation  Mood Pleasant  Thought Process  Coherency WDL  Content WDL  Delusions None reported or observed  Perception WDL  Hallucination None reported or observed  Judgment Poor  Confusion None  Danger to Self  Current suicidal ideation? Denies  Self-Injurious Behavior No self-injurious ideation or behavior indicators observed or expressed   Danger to Others  Danger to Others None reported or observed   Complaints of pain and anxiety. Prn given. Waiting on effectiveness

## 2024-03-18 NOTE — Plan of Care (Signed)
  Problem: Education: Goal: Knowledge of [facility] Education information/materials will improve Outcome: Progressing   Problem: Education: Goal: Emotional status will improve Outcome: Progressing   Problem: Education: Goal: Mental status will improve Outcome: Progressing   

## 2024-03-18 NOTE — Group Note (Signed)
 Recreation Therapy Group Note   Group Topic:Healthy Support Systems  Group Date: 03/18/2024 Start Time: 1040 End Time: 1130 Facilitators: Deatrice Factor, LRT, CTRS Location: Craft Room  Group Description: Straw Bridge. In groups or individually, patients were given 10 plastic drinking straws and an equal length of masking tape. Using the materials provided, patients were instructed to build a free-standing bridge-like structure to suspend an everyday item (ex: deck of cards) off the floor or table surface. All materials were required to be used in Secondary school teacher. LRT facilitated post-activity discussion reviewing the importance of having strong and healthy support systems in our lives. LRT discussed how the people in our lives serve as the tape and the deck of cards we placed on top of our straw structure are the stressors we face in daily life. LRT and pts discussed what happens in our life when things get too heavy for us , and we don't have strong supports outside of the hospital. Pt shared 2 of their healthy supports in their life aloud in the group.   Goal Area(s) Addressed:  Patient will identify 2 healthy supports in their life. Patient will identify skills to successfully complete activity. Patient will identify correlation of this activity to life post-discharge.  Patient will build on frustration tolerance skills. Patient will increase team building and communication skills.    Affect/Mood: Appropriate   Participation Level: Active and Engaged   Participation Quality: Independent   Behavior: Appropriate, Calm, and Cooperative   Speech/Thought Process: Coherent   Insight: Good   Judgement: Good   Modes of Intervention: STEM Activity   Patient Response to Interventions:  Attentive, Engaged, Interested , and Receptive   Education Outcome:  Acknowledges education   Clinical Observations/Individualized Feedback: Brett Sanford was active in their participation of session activities  and group discussion. Pt identified "my sister and my favorite spot out in nature hiking and just thinking" as his healthy supports. Pt appropriately made a straw structure. Pt interacted well with LRT and peers duration of session.    Plan: Continue to engage patient in RT group sessions 2-3x/week.   Deatrice Factor, LRT, CTRS 03/18/2024 1:06 PM

## 2024-03-18 NOTE — Group Note (Signed)
 Date:  03/18/2024 Time:  5:18 PM  Group Topic/Focus:  Activity Group: The main focus of the group is to promote activity for the patients and encourage them to go outside to the courtyard and get some fresh air and some exercise.    Participation Level:  Active  Participation Quality:  Appropriate  Affect:  Appropriate  Cognitive:  Appropriate  Insight: Appropriate  Engagement in Group:  Engaged  Modes of Intervention:  Activity  Additional Comments:    Marianna Shirk Shaya Reddick 03/18/2024, 5:18 PM

## 2024-03-18 NOTE — Progress Notes (Signed)
 [facility] MD Progress Note  03/18/2024 6:54 PM Brett Sanford  MRN:  [id number]   Subjective:  Chart reviewed, case discussed in multidisciplinary meeting, patient seen during rounds.   Brett Sanford is a 33 year old male who presents to [facility] voluntarily seeking treatment for alcohol use disorder and suicidal ideation. Pt has past history of  SI, OD,  MDD, alcohol abuse and polysubstance abuse.   Per chart review, patient initially presented to [facility] reporting passive suicidal ideation in the context of recent relapse on alcohol   Patient seen today for follow-up psychiatric evaluation.  He reports continues to experience some symptoms of alcohol withdrawal however reports much improvement.  We reviewed medication list to which she denies any medication side effects and reports positive benefit from propranolol  taking as needed.  Librium  has been continued x 1 dose to further assist with symptoms of withdrawal.  He denies any thoughts to harm himself or anyone else.  He reports mood has improved and denies suicidal homicidal ideations.  He reports plan to follow-up with [facility] for further substance abuse treatment showing future oriented thinking.  Screens remain negative for mania.  Plan remains to discharge patient later on this week given continued progress and absence of harmful behaviors.   6/8: Patient is seen today for follow-up.  He is found walking the unit.  He is interacting with staff and peers appropriately.  He received as needed propranolol  and trazodone  overnight.  He notes some frustration over not receiving a nighttime dose of Librium  last night.  He notes ongoing symptoms of withdrawal including irritability, anxiety, HTN, and slight tremor.  Discussed we will extend Librium  taper 1 dose.  He denies adverse effects of medication.  Most recent CIWAs 3, 0, 1, and 2.  He denies SI, HI, and AVH.  Continues to demonstrate good insight and need for outpatient follow-up  and medication compliance.  Continue CIWA.  Patient likely appropriate for discharge early in the week.  6/7:Patient is seen today for follow-up.  He is found walking the unit he is pleasantly interacting with staff and peers.  He received PRNs overnight notably Zyprexa , trazodone , and Atarax .  He noted difficulty with sleep we will titrate trazodone .  He denies current symptoms of withdrawal.  Most recent CIWA's have been 2, 0, 0, 5.  Patient does not appear in any acute distress on exam.  He continues to tolerate Librium  taper.  Continues to note depressive symptoms but notes improvement.  He is future oriented and discusses a plan to follow-up with [facility].  He asked about medication for anxiety as he did not receive discussed that the propranolol  was as needed and needs to ask for the medication he verbalizes understanding.  Patient agrees to current plan.  Screenings remain negative for mania.  Continue CIWA.  Patient likely appropriate for discharge next week.    Appetite:  Good  Past Psychiatric History: see h&P Family History:  Family History  Family history unknown: Yes   Social History:  Social History   Substance and Sexual Activity  Alcohol Use Yes   Alcohol/week: 8.0 standard drinks of alcohol   Types: 8 Shots of liquor per week   Comment: a few shots/5-6 days/week     Social History   Substance and Sexual Activity  Drug Use Yes   Types: Marijuana   Comment: once a week    Social History   Socioeconomic History   Marital status: Single    Spouse name: Not on  file   Number of children: Not on file   Years of education: Not on file   Highest education level: Not on file  Occupational History   Not on file  Tobacco Use   Smoking status: Former    Current packs/day: 1.50    Average packs/day: 1.5 packs/day for 5.0 years (7.5 ttl pk-yrs)    Types: Cigarettes   Smokeless tobacco: Never   Tobacco comments:    1-2 packs per day/5 years  Vaping Use   Vaping status:  Never Used  Substance and Sexual Activity   Alcohol use: Yes    Alcohol/week: 8.0 standard drinks of alcohol    Types: 8 Shots of liquor per week    Comment: a few shots/5-6 days/week   Drug use: Yes    Types: Marijuana    Comment: once a week   Sexual activity: Yes  Other Topics Concern   Not on file  Social History Narrative   Pt employed and living with mother who he identifies as his main social support. Pt has not taken his meds for the last 6 months. Pt has trouble getting medications because he does not have insurance.   Social Drivers of Corporate investment banker Strain: Low Risk  (09/29/2022)   Received [facility], [facility]   Overall Financial Resource Strain (CARDIA)    Difficulty of Paying Living Expenses: Not very hard  Food Insecurity: No Food Insecurity (03/12/2024)   Hunger Vital Sign    Worried About Running Out of Food in the Last Year: Never true    Ran Out of Food in the Last Year: Never true  Transportation Needs: No Transportation Needs (03/12/2024)   PRAPARE - Administrator, Civil Service (Medical): No    Lack of Transportation (Non-Medical): No  Physical Activity: Sufficiently Active (09/29/2022)   Received from [facility], [facility]   Exercise Vital Sign    Days of Exercise per Week: 3 days    Minutes of Exercise per Session: 120 min  Stress: Stress Concern Present (09/29/2022)   Received from [facility], [facility] of Occupational Health - Occupational Stress Questionnaire    Feeling of Stress : To some extent  Social Connections: Socially Isolated (09/29/2022)   Received from [facility], [facility]   Social Connection and Isolation Panel [NHANES]    Frequency of Communication with Friends and Family: More than three times a week    Frequency of Social Gatherings with Friends and Family: More than three times a week    Attends Religious Services: Never    Doctor, general practice or Organizations: No    Attends Engineer, structural: Never    Marital Status: Never married   Past Medical History:  Past Medical History:  Diagnosis Date   ADD (attention deficit disorder)    Drug abuse (HCC)    Medical history non-contributory     Past Surgical History:  Procedure Laterality Date   NO PAST SURGERIES      Current Medications: Current Facility-Administered Medications  Medication Dose Route Frequency Provider Last Rate Last Admin   acetaminophen  (TYLENOL ) tablet 650 mg  650 mg Oral Q6H PRN Olasunkanmi, Oluwatosin, NP   650 mg at 03/17/24 0847   alum & mag hydroxide-simeth (MAALOX/MYLANTA) 200-200-20 MG/5ML suspension 30 mL  30 mL Oral Q4H PRN Olasunkanmi, Oluwatosin, NP   30 mL at 03/13/24 1521  buPROPion  (WELLBUTRIN  XL) 24 hr tablet 150 mg  150 mg Oral Daily Angelia Barcelona, NP   150 mg at 03/18/24 1610   dicyclomine  (BENTYL ) tablet 20 mg  20 mg Oral Q6H PRN Millington, Matthew E, PA-C   20 mg at 03/17/24 2242   hydrOXYzine  (ATARAX ) tablet 25 mg  25 mg Oral Q6H PRN Millington, Matthew E, PA-C   25 mg at 03/17/24 1946   ibuprofen  (ADVIL ) tablet 400 mg  400 mg Oral Q6H PRN Millington, Matthew E, PA-C   400 mg at 03/18/24 1439   magnesium  hydroxide (MILK OF MAGNESIA) suspension 30 mL  30 mL Oral Daily PRN Olasunkanmi, Oluwatosin, NP       multivitamin with minerals tablet 1 tablet  1 tablet Oral Daily Olasunkanmi, Oluwatosin, NP   1 tablet at 03/18/24 9604   OLANZapine  (ZYPREXA ) injection 10 mg  10 mg Intramuscular TID PRN Olasunkanmi, Oluwatosin, NP       OLANZapine  (ZYPREXA ) injection 5 mg  5 mg Intramuscular TID PRN Olasunkanmi, Oluwatosin, NP       OLANZapine  zydis (ZYPREXA ) disintegrating tablet 5 mg  5 mg Oral TID PRN Olasunkanmi, Oluwatosin, NP   5 mg at 03/16/24 0840   ondansetron  (ZOFRAN -ODT) disintegrating tablet 4 mg  4 mg Oral Q6H PRN Millington, Matthew E, PA-C       pantoprazole  (PROTONIX ) EC tablet 40 mg  40 mg Oral Daily Olasunkanmi,  Oluwatosin, NP   40 mg at 03/18/24 0839   propranolol  (INDERAL ) tablet 10 mg  10 mg Oral Q6H PRN Angelia Barcelona, NP   10 mg at 03/18/24 1439   thiamine  (VITAMIN B1) tablet 100 mg  100 mg Oral Daily Olasunkanmi, Oluwatosin, NP   100 mg at 03/18/24 0839   traZODone  (DESYREL ) tablet 50 mg  50 mg Oral QHS PRN Millington, Matthew E, PA-C   50 mg at 03/17/24 2243    Lab Results: No results found for this or any previous visit (from the past 48 hours).  Blood Alcohol level:  Lab Results  Component Value Date   ETH 195 (H) 03/11/2024   ETH 147 (H) 07/31/2023    Metabolic Disorder Labs: Lab Results  Component Value Date   HGBA1C 4.9 07/31/2023   MPG 93.93 07/31/2023   MPG 96.8 04/15/2020   No results found for: "PROLACTIN" Lab Results  Component Value Date   CHOL 317 (H) 07/31/2023   TRIG 241 (H) 07/31/2023   HDL 110 07/31/2023   CHOLHDL 2.9 07/31/2023   VLDL 48 (H) 07/31/2023   LDLCALC 159 (H) 07/31/2023   LDLCALC NOT CALCULATED 04/15/2020     Psychiatric Specialty Exam:  General Appearance:  Casual  Eye Contact: Fair  Speech: Clear and Coherent  Speech Volume: Normal  Mood: Euthymic  Affect: Congruent  Thought Processes: Coherent  Descriptions of Associations:Intact  Orientation:Full (Time, Place and Person)  Thought Content:Logical  Hallucinations:Hallucinations: None  Ideas of Reference:None  Suicidal Thoughts:No data recorded  Homicidal Thoughts:Homicidal Thoughts: No   Sensorium  Memory: Immediate Fair; Recent Fair  Judgment: Fair  Insight: Fair   Art therapist  Concentration: Fair  Attention Span: Good  Recall: Good  Fund of Knowledge: Good  Language: Good   Psychomotor Activity  Psychomotor Activity:Psychomotor Activity: Normal  Musculoskeletal: Strength & Muscle Tone: within normal limits Gait & Station: normal Assets  Assets: Manufacturing systems engineer; Desire for Improvement    Physical  Exam: Physical Exam Vitals and nursing note reviewed.  Constitutional:      Appearance: He  is obese.  HENT:     Head: Atraumatic.  Eyes:     Extraocular Movements: Extraocular movements intact.  Pulmonary:     Effort: Pulmonary effort is normal.  Neurological:     Mental Status: He is alert and oriented to person, place, and time.    Review of Systems  Neurological:  Positive for tremors.  Psychiatric/Behavioral:  Positive for depression. Negative for hallucinations, substance abuse and suicidal ideas. The patient is nervous/anxious.    Blood pressure 135/81, pulse 86, temperature 98 F (36.7 C), resp. rate 17, height 5\' 8"  (1.727 m), weight 94.3 kg, SpO2 98%. Body mass index is 31.63 kg/m.  Diagnosis: Principal Problem:   Alcohol use disorder, severe, dependence (HCC)   PLAN: Safety and Monitoring:  -- Voluntary admission to inpatient psychiatric unit for safety, stabilization and treatment  -- Daily contact with patient to assess and evaluate symptoms and progress in treatment  -- Patient's case to be discussed in multi-disciplinary team meeting  -- Observation Level : q15 minute checks  -- Vital signs:  q12 hours  -- Precautions: suicide, elopement, and assault -- Encouraged patient to participate in unit milieu and in scheduled group therapies  2. Psychiatric Diagnoses and Treatment:      Alcohol use disorder, severe, dependence (HCC) Extended CIWA and Librium  taper by 1 dose.    Major Depressive Disorder, chronic moderate wo psychotic features-continue Wellbutrin  XL 150mg  po daily               Generalized Anxiety Disorder-continue propranolol  10mg  po Q6h prn anxiety     Insomnia-continue trazodone  to 50 mg nightly risk benefits and potential side effects reviewed patient verbalized understanding is agreeable with plan.   3. Medical Issues Being Addressed: medication management and assessment to support safe alcohol detox   4. Discharge Planning:   -- Social work  and case management to assist with discharge planning and identification of hospital follow-up needs prior to discharge  -- Discharge expected likely Tuesday Wednesday will require appropriate follow-up.  Is interested in [facility]. Angelia Barcelona, NP 03/18/2024, 6:54 PM

## 2024-03-18 NOTE — Group Note (Signed)
 Date:  03/18/2024 Time:  8:55 PM  Group Topic/Focus:  Coping With Mental Health Crisis:   The purpose of this group is to help patients identify strategies for coping with mental health crisis.  Group discusses possible causes of crisis and ways to manage them effectively. Self Care:   The focus of this group is to help patients understand the importance of self-care in order to improve or restore emotional, physical, spiritual, interpersonal, and financial health.    Participation Level:  Active  Participation Quality:  Appropriate, Attentive, and Supportive  Affect:  Appropriate  Cognitive:  Appropriate and Oriented  Insight: Appropriate  Engagement in Group:  Engaged and Supportive  Modes of Intervention:  Discussion, Education, and Support  Additional Comments:  n/a  Nabria Nevin L 03/18/2024, 8:55 PM

## 2024-03-19 MED ORDER — PROPRANOLOL HCL 10 MG PO TABS: 10.0000 mg | ORAL_TABLET | Freq: Four times a day (QID) | ORAL | 0 refills | Status: DC | PRN

## 2024-03-19 MED ORDER — BUPROPION HCL ER (XL) 150 MG PO TB24: 150.0000 mg | ORAL_TABLET | Freq: Every day | ORAL | 0 refills | Status: DC

## 2024-03-19 NOTE — Group Note (Signed)
 Date:  03/19/2024 Time:  8:44 PM  Group Topic/Focus:  Wrap-Up Group:   The focus of this group is to help patients review their daily goal of treatment and discuss progress on daily workbooks.    Participation Level:  Active  Participation Quality:  Appropriate and Attentive  Affect:  Appropriate  Cognitive:  Alert and Appropriate  Insight: Appropriate  Engagement in Group:  Engaged  Modes of Intervention:  Discussion and Orientation  Additional Comments:     Maglione,Viktoriya Glaspy E 03/19/2024, 8:44 PM

## 2024-03-19 NOTE — Group Note (Signed)
 Date:  03/19/2024 Time:  6:29 PM  Group Topic/Focus:  Wellness Toolbox:   The focus of this group is to discuss various aspects of wellness, balancing those aspects and exploring ways to increase the ability to experience wellness.  Patients will create a wellness toolbox for use upon discharge.    Participation Level:  Active  Participation Quality:  Appropriate  Affect:  Appropriate  Cognitive:  Appropriate  Insight: Appropriate  Engagement in Group:  Engaged  Modes of Intervention:  Activity  Additional Comments:    [name] 03/19/2024, 6:29 PM

## 2024-03-19 NOTE — Group Note (Unsigned)
 Date:  03/19/2024 Time:  9:33 AM  Group Topic/Focus:  Goals Group:   The focus of this group is to help patients establish daily goals to achieve during treatment and discuss how the patient can incorporate goal setting into their daily lives to aide in recovery.     Participation Level:  {BHH PARTICIPATION GEXBM:84132}  Participation Quality:  {BHH PARTICIPATION QUALITY:22265}  Affect:  {BHH AFFECT:22266}  Cognitive:  {BHH COGNITIVE:22267}  Insight: {BHH Insight2:20797}  Engagement in Group:  {BHH ENGAGEMENT IN GMWNU:27253}  Modes of Intervention:  {BHH MODES OF INTERVENTION:22269}  Additional Comments:  ***  Juanisha Bautch A Donae Kueker 03/19/2024, 9:33 AM

## 2024-03-19 NOTE — Progress Notes (Signed)
 [facility] MD Progress Note  03/19/2024 11:38 AM Brett Sanford  MRN:  [id number]   Subjective:  Chart reviewed, case discussed in multidisciplinary meeting, patient seen during rounds.   Brett Sanford is a 33 year old male who presents to [facility] voluntarily seeking treatment for alcohol use disorder and suicidal ideation. Pt has past history of  SI, OD,  MDD, alcohol abuse and polysubstance abuse.   Per chart review, patient initially presented to [facility] reporting passive suicidal ideation in the context of recent relapse on alcohol   Patient seen today for follow-up psychiatric evaluation.  We reviewed initial presentation and he denies any continued symptoms of alcohol withdrawal.  He reports his mood is brighter and denies any medication side effects.  He reports propranolol  has continued to be beneficial for symptoms of anxiety.  We reviewed the addition of Wellbutrin  as antidepressant, medication education is provided on the risk benefits and side effects as well as the particular need to follow-up with medication management following discharge.  Mania screening remains negative. He reports mood has improved and denies suicidal homicidal ideations.  He reports plan to follow-up with [facility] for further substance abuse treatment showing future oriented thinking.  Screens remain negative for mania.  Plan discussed with pt to discharge tomorrow. He has consistently denied any thoughts to harm himself or anyone else. Behaviors on the unit have been safe without noting any harm to self or others. Pt engages well with this provider, staff and peers. No changes in plan of care indicated. p   Appetite:  Good  Past Psychiatric History: see h&P Family History:  Family History  Family history unknown: Yes   Social History:  Social History   Substance and Sexual Activity  Alcohol Use Yes   Alcohol/week: 8.0 standard drinks of alcohol   Types: 8 Shots of liquor per week    Comment: a few shots/5-6 days/week     Social History   Substance and Sexual Activity  Drug Use Yes   Types: Marijuana   Comment: once a week    Social History   Socioeconomic History   Marital status: Single    Spouse name: Not on file   Number of children: Not on file   Years of education: Not on file   Highest education level: Not on file  Occupational History   Not on file  Tobacco Use   Smoking status: Former    Current packs/day: 1.50    Average packs/day: 1.5 packs/day for 5.0 years (7.5 ttl pk-yrs)    Types: Cigarettes   Smokeless tobacco: Never   Tobacco comments:    1-2 packs per day/5 years  Vaping Use   Vaping status: Never Used  Substance and Sexual Activity   Alcohol use: Yes    Alcohol/week: 8.0 standard drinks of alcohol    Types: 8 Shots of liquor per week    Comment: a few shots/5-6 days/week   Drug use: Yes    Types: Marijuana    Comment: once a week   Sexual activity: Yes  Other Topics Concern   Not on file  Social History Narrative   Pt employed and living with mother who he identifies as his main social support. Pt has not taken his meds for the last 6 months. Pt has trouble getting medications because he does not have insurance.   Social Drivers of Corporate investment banker Strain: Low Risk  (09/29/2022)   Received from [facility], [facility]  Overall Financial Resource Strain (CARDIA)    Difficulty of Paying Living Expenses: Not very hard  Food Insecurity: No Food Insecurity (03/12/2024)   Hunger Vital Sign    Worried About Running Out of Food in the Last Year: Never true    Ran Out of Food in the Last Year: Never true  Transportation Needs: No Transportation Needs (03/12/2024)   PRAPARE - Administrator, Civil Service (Medical): No    Lack of Transportation (Non-Medical): No  Physical Activity: Sufficiently Active (09/29/2022)   Received from [facility], [facility]   Exercise Vital Sign    Days of  Exercise per Week: 3 days    Minutes of Exercise per Session: 120 min  Stress: Stress Concern Present (09/29/2022)   Received from [facility], [facility] - Occupational Stress Questionnaire    Feeling of Stress : To some extent  Social Connections: Socially Isolated (09/29/2022)   Received from [facility], [facility]   Social Connection and Isolation Panel [NHANES]    Frequency of Communication with Friends and Family: More than three times a week    Frequency of Social Gatherings with Friends and Family: More than three times a week    Attends Religious Services: Never    Database administrator or Organizations: No    Attends Engineer, structural: Never    Marital Status: Never married   Past Medical History:  Past Medical History:  Diagnosis Date   ADD (attention deficit disorder)    Drug abuse (HCC)    Medical history non-contributory     Past Surgical History:  Procedure Laterality Date   NO PAST SURGERIES      Current Medications: Current Facility-Administered Medications  Medication Dose Route Frequency Provider Last Rate Last Admin   acetaminophen  (TYLENOL ) tablet 650 mg  650 mg Oral Q6H PRN Olasunkanmi, Oluwatosin, NP   650 mg at 03/17/24 0847   alum & mag hydroxide-simeth (MAALOX/MYLANTA) 200-200-20 MG/5ML suspension 30 mL  30 mL Oral Q4H PRN Olasunkanmi, Oluwatosin, NP   30 mL at 03/13/24 1521   buPROPion  (WELLBUTRIN  XL) 24 hr tablet 150 mg  150 mg Oral Daily Angelia Barcelona, NP   150 mg at 03/19/24 0846   dicyclomine  (BENTYL ) tablet 20 mg  20 mg Oral Q6H PRN Millington, Matthew E, PA-C   20 mg at 03/17/24 2242   hydrOXYzine  (ATARAX ) tablet 25 mg  25 mg Oral Q6H PRN Millington, Matthew E, PA-C   25 mg at 03/17/24 1946   ibuprofen  (ADVIL ) tablet 400 mg  400 mg Oral Q6H PRN Millington, Matthew E, PA-C   400 mg at 03/19/24 0845   magnesium  hydroxide (MILK OF MAGNESIA) suspension 30 mL  30 mL Oral  Daily PRN Olasunkanmi, Oluwatosin, NP       multivitamin with minerals tablet 1 tablet  1 tablet Oral Daily Olasunkanmi, Oluwatosin, NP   1 tablet at 03/19/24 0846   OLANZapine  (ZYPREXA ) injection 10 mg  10 mg Intramuscular TID PRN Olasunkanmi, Oluwatosin, NP       OLANZapine  (ZYPREXA ) injection 5 mg  5 mg Intramuscular TID PRN Olasunkanmi, Oluwatosin, NP       OLANZapine  zydis (ZYPREXA ) disintegrating tablet 5 mg  5 mg Oral TID PRN Olasunkanmi, Oluwatosin, NP   5 mg at 03/16/24 0840   ondansetron  (ZOFRAN -ODT) disintegrating tablet 4 mg  4 mg Oral Q6H PRN Millington, Matthew E, PA-C  propranolol  (INDERAL ) tablet 10 mg  10 mg Oral Q6H PRN Angelia Barcelona, NP   10 mg at 03/19/24 0846   thiamine  (VITAMIN B1) tablet 100 mg  100 mg Oral Daily Olasunkanmi, Oluwatosin, NP   100 mg at 03/19/24 0846   traZODone  (DESYREL ) tablet 50 mg  50 mg Oral QHS PRN Millington, Matthew E, PA-C   50 mg at 03/18/24 2240    Lab Results: No results found for this or any previous visit (from the past 48 hours).  Blood Alcohol level:  Lab Results  Component Value Date   ETH 195 (H) 03/11/2024   ETH 147 (H) 07/31/2023    Metabolic Disorder Labs: Lab Results  Component Value Date   HGBA1C 4.9 07/31/2023   MPG 93.93 07/31/2023   MPG 96.8 04/15/2020   No results found for: "PROLACTIN" Lab Results  Component Value Date   CHOL 317 (H) 07/31/2023   TRIG 241 (H) 07/31/2023   HDL 110 07/31/2023   CHOLHDL 2.9 07/31/2023   VLDL 48 (H) 07/31/2023   LDLCALC 159 (H) 07/31/2023   LDLCALC NOT CALCULATED 04/15/2020     Psychiatric Specialty Exam:  General Appearance:  Casual  Eye Contact: Fair  Speech: Clear and Coherent  Speech Volume: Normal  Mood: Euthymic  Affect: Congruent  Thought Processes: Coherent  Descriptions of Associations:Intact  Orientation:Full (Time, Place and Person)  Thought Content:Logical  Hallucinations:No data recorded  Ideas of Reference:None  Suicidal  Thoughts:No data recorded  Homicidal Thoughts:No data recorded   Sensorium  Memory: Immediate Fair; Recent Fair  Judgment: Fair  Insight: Fair   Art therapist  Concentration: Fair  Attention Span: Good  Recall: Good  Fund of Knowledge: Good  Language: Good   Psychomotor Activity  Psychomotor Activity:No data recorded  Musculoskeletal: Strength & Muscle Tone: within normal limits Gait & Station: normal Assets  Assets: Manufacturing systems engineer; Desire for Improvement    Physical Exam: Physical Exam Vitals and nursing note reviewed.  Constitutional:      Appearance: He is obese.  HENT:     Head: Atraumatic.  Eyes:     Extraocular Movements: Extraocular movements intact.  Pulmonary:     Effort: Pulmonary effort is normal.  Neurological:     Mental Status: He is alert and oriented to person, place, and time.    Review of Systems  Neurological:  Positive for tremors.  Psychiatric/Behavioral:  Positive for depression. Negative for hallucinations, substance abuse and suicidal ideas. The patient is nervous/anxious.    Blood pressure 126/86, pulse 75, temperature 97.7 F (36.5 C), resp. rate 16, height 5\' 8"  (1.727 m), weight 94.3 kg, SpO2 99%. Body mass index is 31.63 kg/m.  Diagnosis: Principal Problem:   Alcohol use disorder, severe, dependence (HCC)   PLAN: Safety and Monitoring:  -- Voluntary admission to inpatient psychiatric unit for safety, stabilization and treatment  -- Daily contact with patient to assess and evaluate symptoms and progress in treatment  -- Patient's case to be discussed in multi-disciplinary team meeting  -- Observation Level : q15 minute checks  -- Vital signs:  q12 hours  -- Precautions: suicide, elopement, and assault -- Encouraged patient to participate in unit milieu and in scheduled group therapies  2. Psychiatric Diagnoses and Treatment:      Alcohol use disorder, severe, dependence (HCC)     Major  Depressive Disorder, chronic moderate wo psychotic features-continue Wellbutrin  XL 150mg  po daily               Generalized  Anxiety Disorder-continue propranolol  10mg  po Q6h prn anxiety     Insomnia-continue trazodone  to 50 mg nightly risk benefits and potential side effects reviewed patient verbalized understanding is agreeable with plan.   3. Medical Issues Being Addressed: medication management and assessment to support safe alcohol detox   4. Discharge Planning:   -- Social work and case management to assist with discharge planning and identification of hospital follow-up needs prior to discharge  -- Discharge expected likely Tuesday Wednesday will require appropriate follow-up.  Is interested in [facility]. Angelia Barcelona, NP 03/19/2024, 11:38 AM

## 2024-03-19 NOTE — Group Note (Signed)
 Date:  03/19/2024 Time:  11:07 AM  Group Topic/Focus:  Wellness Toolbox:   The focus of this group is to discuss various aspects of wellness, balancing those aspects and exploring ways to increase the ability to experience wellness.  Patients will create a wellness toolbox for use upon discharge.    Participation Level:  Active  Participation Quality:  Appropriate  Affect:  Appropriate  Cognitive:  Appropriate  Insight: Appropriate  Engagement in Group:  Engaged  Modes of Intervention:  Activity  Additional Comments:    Rivky Clendenning 03/19/2024, 11:07 AM

## 2024-03-19 NOTE — Progress Notes (Signed)
   03/19/24 1000  Psych Admission Type (Psych Patients Only)  Admission Status Voluntary  Psychosocial Assessment  Patient Complaints Anxiety;Agitation  Eye Contact Fair  Facial Expression Anxious  Affect Anxious  Speech Logical/coherent  Interaction Assertive  Motor Activity Other (Comment) (appropriate for developmental age)  Appearance/Hygiene Unremarkable  Behavior Characteristics Cooperative  Mood Pleasant;Anxious (Patient writes his goal for today is "trying not to get agitated about little things." He writes he will "use coping skills" to help him meet his goal.)  Thought Process  Coherency WDL  Content WDL  Delusions None reported or observed  Perception WDL  Hallucination None reported or observed  Judgment Poor  Confusion None  Danger to Self  Current suicidal ideation? Denies  Self-Injurious Behavior No self-injurious ideation or behavior indicators observed or expressed   Danger to Others  Danger to Others None reported or observed

## 2024-03-19 NOTE — Progress Notes (Incomplete)
  [facility] Adult Case Management Discharge Plan :  Will you be returning to the same living situation after discharge:  Yes,  Patient to return home.  At discharge, do you have transportation home?: Yes,  Patient's mother to provide transportation.  Do you have the ability to pay for your medications: Yes,  [facility]  Release of information consent forms completed and in the chart;  Patient's signature needed at discharge.  Patient to Follow up at:  Follow-up Information     [facility] [employer]. Go to.   Why: In person assessment is 03/25/24 at 9 AM for therapy and medication management. Contact information: [address] Domin[address] F[address] [place] [address] [phone]                 Next level of care provider has access to [facility] Link:no  Safety Planning and Suicide Prevention discussed: No. Patient Refusal for Family/Significant Other Suicide Prevention Education: The patient Brett Sanford has refused to provide written consent for family/significant other to be provided Family/Significant Other Suicide Prevention Education during admission and/or prior to discharge.  Physician notified.   SPE completed with pt, as pt refused to consent to family contact. SPI pamphlet provided to pt and pt was encouraged to share information with support network, ask questions, and talk about any concerns relating to SPE. Pt denies access to guns/firearms and verbalized understanding of information provided. Mobile Crisis information also provided to pt.     Has patient been referred to the Quitline?: Patient does not use tobacco/nicotine  products  Patient has been referred for addiction treatment: Yes, the patient will follow up with an outpatient provider for substance use disorder. Psychiatrist/APP: appointment made and Therapist: appointment made  Roselle Conner, LCSW 03/19/2024, 2:20 PM

## 2024-03-19 NOTE — Plan of Care (Signed)

## 2024-03-19 NOTE — [facility] Suicide Risk Assessment (Signed)
 The patient demonstrates the following risk factors for suicide: Chronic risk factors for suicide include: psychiatric disorder of Major Depression and substance use disorder. Acute risk factors for suicide include: all modifiable risks of suicide have been addressed during this inpatient stay. Protective factors for this patient include: positive social support, positive therapeutic relationship, responsibility to others (children, family), coping skills, hope for the future, and agreeable to medication management, psychiatric follow up and further substance abuse treatment. Considering these factors, the overall suicide risk at this point appears to be low. Patient is appropriate for outpatient follow up.

## 2024-03-19 NOTE — Group Note (Signed)
 Recreation Therapy Group Note   Group Topic:Goal Setting  Group Date: 03/19/2024 Start Time: 1005 End Time: 1055 Facilitators: Deatrice Factor, LRT, CTRS Location: Craft Room  Group Description: Product/process development scientist. Patients were given many different magazines, a glue stick, markers, and a piece of cardstock paper. LRT and pts discussed the importance of having goals in life. LRT and pts discussed the difference between short-term and long-term goals, as well as what a SMART goal is. LRT encouraged pts to create a vision board, with images they picked and then cut out with safety scissors from the magazine, for themselves, that capture their short and long-term goals. LRT encouraged pts to show and explain their vision board to the group.   Goal Area(s) Addressed:  Patient will gain knowledge of short vs. long term goals.  Patient will identify goals for themselves. Patient will practice setting SMART goals. Patient will verbalize their goals to LRT and peers.   Affect/Mood: Appropriate   Participation Level: Active and Engaged   Participation Quality: Independent   Behavior: Appropriate, Calm, and Cooperative   Speech/Thought Process: Coherent   Insight: Good   Judgement: Good   Modes of Intervention: Art   Patient Response to Interventions:  Attentive, Engaged, Interested , and Receptive   Education Outcome:  Acknowledges education   Clinical Observations/Individualized Feedback: Brett Sanford was active in their participation of session activities and group discussion. Pt identified "I want to get a horse since in grew up with them, I want to open my own restaurant, and I want to go fishing" as his goals. Pt appropriately identified images to reflect these goals. Pt interacted well with LRT and peers duration of session.    Plan: Continue to engage patient in RT group sessions 2-3x/week.   Deatrice Factor, LRT, CTRS 03/19/2024 1:00 PM

## 2024-03-19 NOTE — Group Note (Signed)
 LCSW Group Therapy Note   Group Date: 03/19/2024 Start Time: 1300 End Time: 1400   Type of Therapy and Topic:  Group Therapy: Challenging Core Beliefs  Participation Level:  Active  Description of Group:  Patients were educated about core beliefs and asked to identify one harmful core belief that they have. Patients were asked to explore from where those beliefs originate. Patients were asked to discuss how those beliefs make them feel and the resulting behaviors of those beliefs. They were then be asked if those beliefs are true and, if so, what evidence they have to support them. Lastly, group members were challenged to replace those negative core beliefs with helpful beliefs.   Therapeutic Goals:   1. Patient will identify harmful core beliefs and explore the origins of such beliefs. 2. Patient will identify feelings and behaviors that result from those core beliefs. 3. Patient will discuss whether such beliefs are true. 4.  Patient will replace harmful core beliefs with helpful ones.  Summary of Patient Progress:  Patient actively engaged in processing and exploring how core beliefs are formed and how they impact thoughts, feelings, and behaviors. Patient proved open to input from peers and feedback from CSW. Patient demonstrated proficient insight into the subject matter, was respectful and supportive of peers, and participated throughout the entire session.  Therapeutic Modalities: Cognitive Behavioral Therapy; Solution-Focused Therapy   Brett Sanford, LCSWA 03/19/2024  1:44 PM

## 2024-03-20 NOTE — Progress Notes (Signed)
   03/20/24 0900  Psych Admission Type (Psych Patients Only)  Admission Status Voluntary  Psychosocial Assessment  Patient Complaints None  Eye Contact Fair  Facial Expression Anxious  Affect Anxious  Speech Logical/coherent  Interaction Assertive  Motor Activity Slow  Appearance/Hygiene Improved  Behavior Characteristics Appropriate to situation  Mood Pleasant  Thought Process  Coherency WDL  Content WDL  Delusions None reported or observed  Perception WDL  Hallucination None reported or observed  Judgment WDL  Confusion None  Danger to Self  Current suicidal ideation? Denies

## 2024-03-20 NOTE — Progress Notes (Signed)
  [facility] Adult Case Management Discharge Plan :  Will you be returning to the same living situation after discharge:  Yes,  pt reports that he is returning home.  At discharge, do you have transportation home?: Yes,  pt reports that mother will provide transportation.  Do you have the ability to pay for your medications: No.  Release of information consent forms completed and in the chart;  Patient's signature needed at discharge.  Patient to Follow up at:  Follow-up Information     [facility], [employer]. Go to.   Why: In person assessment is 03/25/24 at 9 AM for therapy and medication management. Contact information: [address] [place] [address] [phone]                 Next level of care provider has access to [facility] Link:no  Safety Planning and Suicide Prevention discussed: Yes,  SPE completed with the [employer], patient declined collateral contact.     Has patient been referred to the Quitline?: Patient does not use tobacco/nicotine  products  Patient has been referred for addiction treatment: Yes, the patient will follow up with an outpatient provider for substance use disorder. Therapist: appointment made  Larri Ply, LCSW 03/20/2024, 9:49 AM

## 2024-03-20 NOTE — Discharge Summary (Signed)
 Physician Discharge Summary Note  Patient:  Brett Sanford is an 33 y.o., male MRN:  [id number] DOB:  1991/05/12 Patient phone:  [phone] (home)  Patient address:   [address] [place] [facility] [address],    Date of Admission:  03/12/2024 Date of Discharge: 03/20/24  Reason for Admission:  Brett Sanford is a 33 year old male who presents to [facility] voluntarily seeking treatment for alcohol use disorder and suicidal ideation. Pt has past history of  SI, OD,  MDD, alcohol abuse and polysubstance abuse.   Per chart review, patient initially presented to [facility] reporting passive suicidal ideation in the context of recent relapse on alcohol.    Principal Problem: Alcohol use disorder, severe, dependence (HCC) Discharge Diagnoses: Principal Problem:   Alcohol use disorder, severe, dependence (HCC)    Prev Dx/Sx: alcohol use disorder Current Psych Provider: na Home Meds (current): no psychiatric medications Previous Med Trials: fluoxetine  sertraline  Wellbutrin  Effexor  Therapy: dual targeted therapy   Prior Psych Hospitalization: this is fourth admission last being at this facility 07/2023 Prior Self Harm: yes Prior Violence: no     Social History:  Report raised in [place] , lost father at 3 years old, completed school through high school, met developmental milestones, never married, no children, 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 [employer], currently lives with mother, denies legal history Access to weapons/lethal means: denies    Substance History Alcohol: long history of chronic alcohol abuse  Type of alcohol liquor  Last Drink: Number of drinks per day 1 pint to one fifth liquor per day History of alcohol withdrawal seizures denies History of DT's, reports tremors nausea and vomiting in the past Tobacco:denies  Illicit drugs: denies Prescription drug abuse: denies Rehab hx: X1 at [facility]    Social History:  Social History   Substance  and Sexual Activity  Alcohol Use Yes   Alcohol/week: 8.0 standard drinks of alcohol   Types: 8 Shots of liquor per week   Comment: a few shots/5-6 days/week     Social History   Substance and Sexual Activity  Drug Use Yes   Types: Marijuana   Comment: once a week    Social History   Socioeconomic History   Marital status: Single    Spouse name: Not on file   Number of children: Not on file   Years of education: Not on file   Highest education level: Not on file  Occupational History   Not on file  Tobacco Use   Smoking status: Former    Current packs/day: 1.50    Average packs/day: 1.5 packs/day for 5.0 years (7.5 ttl pk-yrs)    Types: Cigarettes   Smokeless tobacco: Never   Tobacco comments:    1-2 packs per day/5 years  Vaping Use   Vaping status: Never Used  Substance and Sexual Activity   Alcohol use: Yes    Alcohol/week: 8.0 standard drinks of alcohol    Types: 8 Shots of liquor per week    Comment: a few shots/5-6 days/week   Drug use: Yes    Types: Marijuana    Comment: once a week   Sexual activity: Yes  Other Topics Concern   Not on file  Social History Narrative   Pt employed and living with mother who he identifies as his main social support. Pt has not taken his meds for the last 6 months. Pt has trouble getting medications because he does not have insurance.   Social Drivers of Health  Financial Resource Strain: Low Risk  (09/29/2022)   Received from [facility], [facility]   Overall Financial Resource Strain (CARDIA)    Difficulty of Paying Living Expenses: Not very hard  Food Insecurity: No Food Insecurity (03/12/2024)   Hunger Vital Sign    Worried About Running Out of Food in the Last Year: Never true    Ran Out of Food in the Last Year: Never true  Transportation Needs: No Transportation Needs (03/12/2024)   PRAPARE - Administrator, Civil Service (Medical): No    Lack of Transportation (Non-Medical): No  Physical  Activity: Sufficiently Active (09/29/2022)   Received from [facility], [facility]   Exercise Vital Sign    Days of Exercise per Week: 3 days    Minutes of Exercise per Session: 120 min  Stress: Stress Concern Present (09/29/2022)   Received from [facility], [facility] of Occupational Health - Occupational Stress Questionnaire    Feeling of Stress : To some extent  Social Connections: Socially Isolated (09/29/2022)   Received from [facility], [facility]   Social Connection and Isolation Panel [NHANES]    Frequency of Communication with Friends and Family: More than three times a week    Frequency of Social Gatherings with Friends and Family: More than three times a week    Attends Religious Services: Never    Database administrator or Organizations: No    Attends Engineer, structural: Never    Marital Status: Never married   Past Medical History:  Past Medical History:  Diagnosis Date   ADD (attention deficit disorder)    Drug abuse (HCC)    Medical history non-contributory     Past Surgical History:  Procedure Laterality Date   NO PAST SURGERIES     Family History:  Family History  Family history unknown: Yes    Hospital Course:  Patient initially presented to this setting for psychiatric treatment voluntarily related to mood decompensation and thoughts to harm self in the context of relapse of alcohol.   Medications were started to address presenting symptoms and targeted to doses listed below. Patient was also supported with prn medication and symptom monitoring while safely detox from alcohol.   Patient has been monitored in this setting and there have been no self-harm or aggressive behaviors noted. Patient consistently denied thoughts to harm self or anyone else. Patient is linear, direct and organized; and is not currently significantly impaired, psychotic or manic on exam.   The patient is forward thinking and  future planning as evidenced by providing goal of continuing treatment for alcohol use disorder in the outpatient setting.  Detailed risk assessment is complete based on clinical exam and individual risk factors and acute suicide risk is low and acute violence risk is low.   Currently, all modifiable risk of harm to self/harm to others have been addressed and patient is no longer appropriate for the acute inpatient setting and is able to continue treatment for mental health needs in the community with the supports as indicated below.  Patient is educated and verbalized understanding of discharge plan of care including medications, follow-up appointments, mental health resources and further crisis services in the community.  He is instructed to call 911 or present to the nearest emergency room should he experience any decompensation in mood, disturbance of bowel or return of suicidal/homicidal ideations.  Patient verbalizes understanding of this education  and agrees to this plan of care.    Psychiatric Specialty Exam:  Presentation  General Appearance:  Casual  Eye Contact: Fair  Speech: Clear and Coherent  Speech Volume: Normal    Mood and Affect  Mood: Euthymic  Affect: Congruent   Thought Process  Thought Processes: Coherent  Descriptions of Associations:Intact  Orientation:Full (Time, Place and Person)  Thought Content:Logical  Hallucinations:No data recorded Ideas of Reference:None  Suicidal Thoughts:No data recorded Homicidal Thoughts:No data recorded  Sensorium  Memory: Immediate Fair; Recent Fair  Judgment: Fair  Insight: Fair   Art therapist  Concentration: Fair  Attention Span: Good  Recall: Good  Fund of Knowledge: Good  Language: Good   Psychomotor Activity  Psychomotor Activity:No data recorded Musculoskeletal: Strength & Muscle Tone: within normal limits Gait & Station: normal Assets  Assets: Manufacturing systems engineer;  Desire for Improvement   Sleep  Sleep:No data recorded   Physical Exam: Physical Exam ROS Blood pressure 135/69, pulse 80, temperature (!) 97 F (36.1 C), resp. rate 20, height 5' 8 (1.727 m), weight 94.3 kg, SpO2 97%. Body mass index is 31.63 kg/m.   Social History   Tobacco Use  Smoking Status Former   Current packs/day: 1.50   Average packs/day: 1.5 packs/day for 5.0 years (7.5 ttl pk-yrs)   Types: Cigarettes  Smokeless Tobacco Never  Tobacco Comments   1-2 packs per day/5 years   Tobacco Cessation:  N/A, patient does not currently use tobacco products   Blood Alcohol level:  Lab Results  Component Value Date   ETH 195 (H) 03/11/2024   ETH 147 (H) 07/31/2023    Metabolic Disorder Labs:  Lab Results  Component Value Date   HGBA1C 4.9 07/31/2023   MPG 93.93 07/31/2023   MPG 96.8 04/15/2020   No results found for: PROLACTIN Lab Results  Component Value Date   CHOL 317 (H) 07/31/2023   TRIG 241 (H) 07/31/2023   HDL 110 07/31/2023   CHOLHDL 2.9 07/31/2023   VLDL 48 (H) 07/31/2023   LDLCALC 159 (H) 07/31/2023   LDLCALC NOT CALCULATED 04/15/2020    See Psychiatric Specialty Exam and Suicide Risk Assessment completed by Attending Physician prior to discharge.  Discharge destination:  Home  Is patient on multiple antipsychotic therapies at discharge:  No   Has Patient had three or more failed trials of antipsychotic monotherapy by history:  No  Recommended Plan for Multiple Antipsychotic Therapies: NA  Discharge Instructions     Diet general   Complete by: As directed    Increase activity slowly   Complete by: As directed       Allergies as of 03/20/2024       Reactions   Benadryl [diphenhydramine Hcl] Other (See Comments)   Caused hyperactivity   Metoclopramide  Other (See Comments)   Jittery        Medication List     STOP taking these medications    acetaminophen  500 MG tablet Commonly known as: TYLENOL    ondansetron  4 MG  disintegrating tablet Commonly known as: ZOFRAN -ODT   PriLOSEC OTC 20 MG tablet Generic drug: omeprazole   traZODone  50 MG tablet Commonly known as: DESYREL        TAKE these medications      Indication  buPROPion  150 MG 24 hr tablet Commonly known as: WELLBUTRIN  XL Take 1 tablet (150 mg total) by mouth daily.  Indication: Major Depressive Disorder   propranolol  10 MG tablet Commonly known as: INDERAL  Take 1 tablet (10 mg total) by mouth  every 6 (six) hours as needed for up to 15 days (anxiety).  Indication: Feeling Anxious        Follow-up Information     Inc, [facility]. Go to.   Why: In person assessment is 03/25/24 at 9 AM for therapy and medication management. Contact information: [address] [place] [address] [phone]                    Signed: Angelia Barcelona, NP 03/20/2024, 2:28 PM

## 2024-03-20 NOTE — Plan of Care (Signed)

## 2024-03-20 NOTE — Progress Notes (Signed)
   03/19/24 2200  Psych Admission Type (Psych Patients Only)  Admission Status Voluntary  Psychosocial Assessment  Patient Complaints None  Eye Contact Fair  Facial Expression Anxious  Affect Anxious  Speech Logical/coherent  Interaction Assertive  Motor Activity Slow  Appearance/Hygiene Improved  Behavior Characteristics Cooperative;Appropriate to situation  Mood Pleasant  Thought Process  Coherency WDL  Content WDL  Delusions None reported or observed  Perception WDL  Hallucination None reported or observed  Judgment Poor  Confusion None  Danger to Self  Current suicidal ideation? Denies  Self-Injurious Behavior No self-injurious ideation or behavior indicators observed or expressed   Danger to Others  Danger to Others None reported or observed

## 2024-03-20 NOTE — Group Note (Signed)
 Date:  03/20/2024 Time:  10:37 AM  Group Topic/Focus:  Emotional Education:   The focus of this group is to discuss what feelings/emotions are, and how they are experienced. Healthy Communication:   The focus of this group is to discuss communication, barriers to communication, as well as healthy ways to communicate with others. Personal Choices and Values:   The focus of this group is to help patients assess and explore the importance of values in their lives, how their values affect their decisions, how they express their values and what opposes their expression.    Participation Level:  Active  Participation Quality:  Appropriate  Affect:  Appropriate  Cognitive:  Appropriate  Insight: Appropriate  Engagement in Group:  Engaged  Modes of Intervention:  Activity  Additional Comments:    Cierria Height 03/20/2024, 10:37 AM

## 2024-03-20 NOTE — Progress Notes (Signed)
 Discharge Note:  Patient denies SI/HI/AVH at this time. Discharge instructions, AVS, prescriptions, and transition record gone over with patient. Patient agrees to comply with medication management, follow-up visit, and outpatient therapy. Patient belongings returned to patient. Patient questions and concerns addressed and answered.

## 2024-03-20 NOTE — Plan of Care (Signed)
   Problem: Education: Goal: Knowledge of [place] Education information/materials will improve Outcome: Progressing Goal: Emotional status will improve Outcome: Progressing

## 2024-03-31 ENCOUNTER — Emergency Department (HOSPITAL_COMMUNITY)
Admission: EM | Admit: 2024-03-31 | Discharge: 2024-04-02 | Disposition: A | Discharge status: Other Acute Inpatient Hospital | Payer: 59 | Attending: Emergency Medicine | Admitting: Emergency Medicine

## 2024-03-31 ENCOUNTER — Encounter (HOSPITAL_COMMUNITY): Payer: Self-pay

## 2024-03-31 ENCOUNTER — Other Ambulatory Visit: Payer: Self-pay

## 2024-03-31 DIAGNOSIS — F332 Major depressive disorder, recurrent severe without psychotic features: Secondary | ICD-10-CM | POA: Diagnosis present

## 2024-03-31 DIAGNOSIS — T50904A Poisoning by unspecified drugs, medicaments and biological substances, undetermined, initial encounter: Secondary | ICD-10-CM

## 2024-03-31 DIAGNOSIS — T450X4A Poisoning by antiallergic and antiemetic drugs, undetermined, initial encounter: Secondary | ICD-10-CM | POA: Insufficient documentation

## 2024-03-31 DIAGNOSIS — F329 Major depressive disorder, single episode, unspecified: Secondary | ICD-10-CM | POA: Insufficient documentation

## 2024-03-31 DIAGNOSIS — Y906 Blood alcohol level of 120-199 mg/100 ml: Secondary | ICD-10-CM | POA: Insufficient documentation

## 2024-03-31 DIAGNOSIS — F10120 Alcohol abuse with intoxication, uncomplicated: Secondary | ICD-10-CM | POA: Insufficient documentation

## 2024-03-31 DIAGNOSIS — F10920 Alcohol use, unspecified with intoxication, uncomplicated: Secondary | ICD-10-CM

## 2024-03-31 DIAGNOSIS — F1910 Other psychoactive substance abuse, uncomplicated: Secondary | ICD-10-CM | POA: Insufficient documentation

## 2024-03-31 LAB — COMPREHENSIVE METABOLIC PANEL WITH GFR
ALT: 24 U/L (ref 0–44)
AST: 30 U/L (ref 15–41)
Albumin: 3.8 g/dL (ref 3.5–5.0)
Alkaline Phosphatase: 48 U/L (ref 38–126)
Anion gap: 14 (ref 5–15)
BUN: 18 mg/dL (ref 6–20)
CO2: 20 mmol/L — ABNORMAL LOW (ref 22–32)
Calcium: 8.8 mg/dL — ABNORMAL LOW (ref 8.9–10.3)
Chloride: 103 mmol/L (ref 98–111)
Creatinine, Ser: 0.92 mg/dL (ref 0.61–1.24)
GFR, Estimated: >60 mL/min (ref >60)
Glucose, Bld: 156 mg/dL — ABNORMAL HIGH (ref 70–99)
Potassium: 3.5 mmol/L (ref 3.5–5.1)
Sodium: 137 mmol/L (ref 135–145)
Total Bilirubin: 1 mg/dL (ref 0.0–1.2)
Total Protein: 6.7 g/dL (ref 6.5–8.1)

## 2024-03-31 LAB — CBC WITH DIFFERENTIAL/PLATELET
Abs Immature Granulocytes: 0.09 10*3/uL — ABNORMAL HIGH (ref 0.00–0.07)
Basophils Absolute: 0.1 10*3/uL (ref 0.0–0.1)
Basophils Relative: 1 %
Eosinophils Absolute: 0.1 10*3/uL (ref 0.0–0.5)
Eosinophils Relative: 1 %
HCT: 48.5 % (ref 39.0–52.0)
Hemoglobin: 16.5 g/dL (ref 13.0–17.0)
Immature Granulocytes: 1 %
Lymphocytes Relative: 32 %
Lymphs Abs: 2.7 10*3/uL (ref 0.7–4.0)
MCH: 33.3 pg (ref 26.0–34.0)
MCHC: 34 g/dL (ref 30.0–36.0)
MCV: 97.8 fL (ref 80.0–100.0)
Monocytes Absolute: 0.6 10*3/uL (ref 0.1–1.0)
Monocytes Relative: 7 %
Neutro Abs: 4.9 10*3/uL (ref 1.7–7.7)
Neutrophils Relative %: 58 %
Platelets: 306 10*3/uL (ref 150–400)
RBC: 4.96 MIL/uL (ref 4.22–5.81)
RDW: 12.8 % (ref 11.5–15.5)
WBC: 8.4 10*3/uL (ref 4.0–10.5)
nRBC: 0 % (ref 0.0–0.2)

## 2024-03-31 LAB — ETHANOL: Alcohol, Ethyl (B): 130 mg/dL — ABNORMAL HIGH (ref <15)

## 2024-03-31 LAB — RAPID URINE DRUG SCREEN, HOSP PERFORMED
Amphetamines: NOT DETECTED
Barbiturates: NOT DETECTED
Benzodiazepines: POSITIVE — AB
Cocaine: NOT DETECTED
Opiates: NOT DETECTED
Tetrahydrocannabinol: POSITIVE — AB

## 2024-03-31 LAB — ACETAMINOPHEN LEVEL: Acetaminophen (Tylenol), Serum: <10 ug/mL — ABNORMAL LOW (ref 10–30)

## 2024-03-31 LAB — SALICYLATE LEVEL: Salicylate Lvl: <7 mg/dL — ABNORMAL LOW (ref 7.0–30.0)

## 2024-03-31 MED ORDER — CHARCOAL ACTIVATED PO LIQD
50.0000 g | Freq: Once | ORAL | Status: AC
  Administered 2024-03-31: 50 g via ORAL
  Filled 2024-03-31: qty 240

## 2024-03-31 MED ORDER — LACTATED RINGERS IV BOLUS
1000.0000 mL | Freq: Once | INTRAVENOUS | Status: AC
  Administered 2024-03-31: 1000 mL via INTRAVENOUS

## 2024-03-31 NOTE — ED Provider Notes (Signed)
 [place] EMERGENCY DEPARTMENT AT [facility] Provider Note   CSN: [id number] Arrival date & time: 03/31/24  1958     Patient presents with: Ingestion   Brett Sanford is a 33 y.o. male.    Ingestion     Pt states he had promethazine  liquid for nausea.  He had a bottle that was left over from a prescription.  He decided to empty a bottle because his mom was asking him to clean up the area but he he decided to put it in a glass on the counter in case he was going to need it later.  Pt states he left the glass on the counter while he was at work today.  WHe he got home he poured himself something to drink and used that same glass not realizing he had the promethazine  there.  Pt thought it was left over cranberry juice. Pt states he then called 911.  He thinks it was 2/3 of the bottle.  Patient states he is feeling somewhat nauseated now.  He feels like he is having some visual hallucinations.  He is adamant that he was not trying to harm himself.  He was recently admitted to the hospital for suicidal ideation.  Prior to Admission medications   Medication Sig Start Date End Date Taking? Authorizing Provider  buPROPion  (WELLBUTRIN  XL) 150 MG 24 hr tablet Take 1 tablet (150 mg total) by mouth daily. 03/20/24  Yes Cleotilde Hoy HERO, NP  omeprazole (PRILOSEC OTC) 20 MG tablet Take 20 mg by mouth daily as needed.   Yes [provider]  promethazine  (PHENERGAN ) 6.25 MG/5ML solution Take by mouth once.   Yes [provider]  propranolol  (INDERAL ) 10 MG tablet Take 1 tablet (10 mg total) by mouth every 6 (six) hours as needed for up to 15 days (anxiety). 03/19/24 04/03/24 Yes Cleotilde Hoy HERO, NP    Allergies: Benadryl [diphenhydramine hcl] and Metoclopramide     Review of Systems  Updated Vital Signs BP (!) 159/105   Pulse 99   Temp 97.6 F (36.4 C)   Resp 18   SpO2 96%   Physical Exam Vitals and nursing note reviewed.  Constitutional:      Appearance:  He is well-developed. He is not diaphoretic.  HENT:     Head: Normocephalic and atraumatic.     Right Ear: External ear normal.     Left Ear: External ear normal.   Eyes:     General: No scleral icterus.       Right eye: No discharge.        Left eye: No discharge.     Conjunctiva/sclera: Conjunctivae normal.   Neck:     Trachea: No tracheal deviation.   Cardiovascular:     Rate and Rhythm: Normal rate and regular rhythm.  Pulmonary:     Effort: Pulmonary effort is normal. No respiratory distress.     Breath sounds: Normal breath sounds. No stridor. No wheezing or rales.  Abdominal:     General: Bowel sounds are normal. There is no distension.     Palpations: Abdomen is soft.     Tenderness: There is no abdominal tenderness. There is no guarding or rebound.   Musculoskeletal:        General: No tenderness or deformity.     Cervical back: Neck supple.   Skin:    General: Skin is warm and dry.     Findings: No rash.   Neurological:     General:  No focal deficit present.     Mental Status: He is alert.     Cranial Nerves: No cranial nerve deficit, dysarthria or facial asymmetry.     Sensory: No sensory deficit.     Motor: No abnormal muscle tone or seizure activity.     Coordination: Coordination normal.   Psychiatric:        Mood and Affect: Mood normal.     (all labs ordered are listed, but only abnormal results are displayed) Labs Reviewed  SALICYLATE LEVEL - Abnormal; Notable for the following components:      Result Value   Salicylate Lvl <7.0 (*)    All other components within normal limits  ACETAMINOPHEN  LEVEL - Abnormal; Notable for the following components:   Acetaminophen  (Tylenol ), Serum <10 (*)    All other components within normal limits  ETHANOL - Abnormal; Notable for the following components:   Alcohol, Ethyl (B) 130 (*)    All other components within normal limits  RAPID URINE DRUG SCREEN, HOSP PERFORMED - Abnormal; Notable for the following  components:   Benzodiazepines POSITIVE (*)    Tetrahydrocannabinol POSITIVE (*)    All other components within normal limits  CBC WITH DIFFERENTIAL/PLATELET - Abnormal; Notable for the following components:   Abs Immature Granulocytes 0.09 (*)    All other components within normal limits  COMPREHENSIVE METABOLIC PANEL WITH GFR - Abnormal; Notable for the following components:   CO2 20 (*)    Glucose, Bld 156 (*)    Calcium  8.8 (*)    All other components within normal limits    EKG: EKG Interpretation Date/Time:  Sunday March 31 2024 20:07:21 EDT Ventricular Rate:  99 PR Interval:  155 QRS Duration:  87 QT Interval:  349 QTC Calculation: 448 R Axis:   7  Text Interpretation: Sinus rhythm No significant change since last tracing Confirmed by Randol Simmonds [phone]) on 03/31/2024 8:32:32 PM  Radiology: No results found.   .Critical Care  Performed by: Randol Simmonds, MD Authorized by: Randol Simmonds, MD   Critical care provider statement:    Critical care time (minutes):  30   Critical care was time spent personally by me on the following activities:  Development of treatment plan with patient or surrogate, discussions with consultants, evaluation of patient's response to treatment, examination of patient, ordering and review of laboratory studies, ordering and review of radiographic studies, ordering and performing treatments and interventions, pulse oximetry, re-evaluation of patient's condition and review of old charts    Medications Ordered in the ED  charcoal activated (NO SORBITOL) (ACTIDOSE-AQUA) suspension 50 g (50 g Oral Given 03/31/24 2044)  lactated ringers bolus 1,000 mL (0 mLs Intravenous Stopped 03/31/24 2147)    Clinical Course as of 03/31/24 2330  Sun Mar 31, 2024  2034 This was discussed with poison center.  They recommend activated charcoal.  Monitor for 6 hours [JK]  2148 CBC WITH DIFFERENTIAL(!) CBC normal.  Salicylate level normal.  Alcohol level elevated at 130 [JK]     Clinical Course User Index [JK] Randol Simmonds, MD                                 Medical Decision Making Amount and/or Complexity of Data Reviewed Labs: ordered. Decision-making details documented in ED Course.  Risk OTC drugs.   Patient presented to the ED for evaluation of an overdose of Phenergan .  Patient states this was accidental.  However the story is somewhat confusing and that he states he accidentally drank Phenergan  that he had poured into a cup and it was left out onto the counter.  Patient at this time denies SI or HI.  He does however have history of recent suicidal ideation and psychiatric hospitalization.  Patient has remained medically stable at this point.  We did contact poison center.  Plan will be for 6 hours of monitoring which will conclude at 2 AM.  He has remained stable at this point.  Patient does agree to speak to the psychiatric team.  Care turned over to Dr Trine     Final diagnoses:  Overdose of undetermined intent, initial encounter  Alcoholic intoxication without complication [facility])    ED Discharge Orders     None          Randol Simmonds, MD 03/31/24 [phone]

## 2024-03-31 NOTE — ED Notes (Signed)
 Spoke with Poison Control- they recommend monitoring for a minimum of 6 hours post ingestion and activated charcoal. Assess for s/s of anticholinergic effects, seizure, delirium and dystonic reaction.

## 2024-03-31 NOTE — ED Triage Notes (Addendum)
 BIBA from home, pt accidentally took approx 600 mg of promethazine , also had a couple of shots of liquor. Pt has N/V and visual hallucinations currently. Pt states he has had SI recently, but this was not an attempt at self harm.

## 2024-04-01 ENCOUNTER — Encounter (HOSPITAL_COMMUNITY): Payer: Self-pay | Admitting: Psychiatry

## 2024-04-01 DIAGNOSIS — F10920 Alcohol use, unspecified with intoxication, uncomplicated: Secondary | ICD-10-CM | POA: Diagnosis not present

## 2024-04-01 DIAGNOSIS — F1910 Other psychoactive substance abuse, uncomplicated: Secondary | ICD-10-CM | POA: Insufficient documentation

## 2024-04-01 MED ORDER — ONDANSETRON 4 MG PO TBDP
4.0000 mg | ORAL_TABLET | Freq: Once | ORAL | Status: AC
  Administered 2024-04-01: 4 mg via ORAL

## 2024-04-01 MED ORDER — LORAZEPAM 1 MG PO TABS: 0.0000 mg | ORAL_TABLET | Freq: Two times a day (BID) | ORAL | Status: DC

## 2024-04-01 MED ORDER — LORAZEPAM 1 MG PO TABS
2.0000 mg | ORAL_TABLET | Freq: Four times a day (QID) | ORAL | Status: DC
  Administered 2024-04-01: 2 mg via ORAL
  Filled 2024-04-01: qty 2

## 2024-04-01 MED ORDER — ADULT MULTIVITAMIN W/MINERALS CH
1.0000 | ORAL_TABLET | Freq: Every day | ORAL | Status: DC
  Administered 2024-04-01: 1 via ORAL
  Filled 2024-04-01: qty 1

## 2024-04-01 MED ORDER — THIAMINE HCL 100 MG/ML IJ SOLN: 100.0000 mg | Freq: Once | INTRAMUSCULAR | Status: DC

## 2024-04-01 MED ORDER — THIAMINE HCL 100 MG/ML IJ SOLN: 100.0000 mg | Freq: Every day | INTRAMUSCULAR | Status: DC

## 2024-04-01 MED ORDER — ONDANSETRON 4 MG PO TBDP
4.0000 mg | ORAL_TABLET | Freq: Four times a day (QID) | ORAL | Status: DC | PRN
  Filled 2024-04-01: qty 1

## 2024-04-01 MED ORDER — LOPERAMIDE HCL 2 MG PO CAPS: 2.0000 mg | ORAL_CAPSULE | ORAL | Status: DC | PRN

## 2024-04-01 MED ORDER — LORAZEPAM 1 MG PO TABS
0.0000 mg | ORAL_TABLET | Freq: Four times a day (QID) | ORAL | Status: DC
  Filled 2024-04-01: qty 2

## 2024-04-01 MED ORDER — LORAZEPAM 1 MG PO TABS: 2.0000 mg | ORAL_TABLET | Freq: Two times a day (BID) | ORAL | Status: DC

## 2024-04-01 MED ORDER — THIAMINE MONONITRATE 100 MG PO TABS
100.0000 mg | ORAL_TABLET | Freq: Every day | ORAL | Status: DC
  Administered 2024-04-01: 100 mg via ORAL
  Filled 2024-04-01: qty 1

## 2024-04-01 MED ORDER — CHLORDIAZEPOXIDE HCL 25 MG PO CAPS
25.0000 mg | ORAL_CAPSULE | Freq: Four times a day (QID) | ORAL | Status: DC | PRN
Start: 2024-04-01 — End: 2024-04-04
  Administered 2024-04-01: 25 mg via ORAL
  Filled 2024-04-01: qty 1

## 2024-04-01 MED ORDER — HYDROXYZINE HCL 25 MG PO TABS
25.0000 mg | ORAL_TABLET | Freq: Four times a day (QID) | ORAL | Status: DC | PRN
  Administered 2024-04-01: 25 mg via ORAL
  Filled 2024-04-01: qty 1

## 2024-04-01 NOTE — ED Notes (Addendum)
 Patient dressed out. Belongings in [place] 5-8, (1 bag)

## 2024-04-01 NOTE — ED Notes (Addendum)
 TTS in progress

## 2024-04-01 NOTE — ED Notes (Signed)
 Poison control called for update, pt no longer needs to be medically obs

## 2024-04-01 NOTE — ED Notes (Addendum)
 Patient ambulated to the bathroom.

## 2024-04-01 NOTE — ED Provider Notes (Signed)
 I assumed care of this patient from previous provider.  Please see their note for further details of history, exam, and MDM.   Briefly patient is a 33 y.o. male who presented here for reportedly accidental ingestion of liquid phenergan . Patient denied SI. He was recently admitted to [facility] for SI. Need to monitor for additional 3 hrs for medical clearance.  3:15 AM Patient is medically cleared for [facility] eval and dispo.       Trine Raynell Moder, MD 04/01/24 [phone]

## 2024-04-01 NOTE — ED Provider Notes (Signed)
 Emergency Medicine Observation Re-evaluation Note  Brett Sanford is a 33 y.o. male, seen on rounds today.  Pt initially presented to the ED for complaints of Ingestion Reports accidentally drank phenergan  liquid in large amount thinking it was juice.  He denies SI but had recent evaluation for this. Currently, the patient is awaiting psychiatry disposition.  Physical Exam  BP (!) 146/92   Pulse (!) 104   Temp 97.8 F (36.6 C) (Oral)   Resp 19   SpO2 99%  Physical Exam General: NAD  Cardiac: RR Lungs: even unlabored Psych: na  ED Course / MDM  EKG:EKG Interpretation Date/Time:  Sunday March 31 2024 20:07:21 EDT Ventricular Rate:  99 PR Interval:  155 QRS Duration:  87 QT Interval:  349 QTC Calculation: 448 R Axis:   7  Text Interpretation: Sinus rhythm No significant change since last tracing Confirmed by Randol Simmonds [phone]) on 03/31/2024 8:32:32 PM  I have reviewed the labs performed to date as well as medications administered while in observation.  Recent changes in the last 24 hours include arrival to ED.  Plan  Current plan is for TTS evaluation//psychiatry disposition.    Dreama Longs, MD 04/01/24 [phone]

## 2024-04-01 NOTE — ED Notes (Signed)
 Patient reports withdrawing from alcohol and currently has moderate tremors. BP and HR elevated. Patient also reports taking Omeprazole and Wellbutrin  at home. Provider CHARLENA Massy MD notified.

## 2024-04-01 NOTE — BH Assessment (Signed)
 TTS consult will be completed by IRIS. IRIS Coordinator will communicate in established secure chat assessment time and provider name. Thanks

## 2024-04-01 NOTE — ED Notes (Signed)
 Patient wanded by security and taken back to TCU 31

## 2024-04-01 NOTE — BH Assessment (Signed)
 Comprehensive Clinical Assessment (CCA) Note   04/01/2024 Brett Sanford [id number]  Disposition: Roxianne Olp, NP recommends continuous observation. Pt will be seen by psychiatry in AM.   The patient demonstrates the following risk factors for suicide: Chronic risk factors for suicide include: previous suicide attempts  . Acute risk factors for suicide include: social withdrawal/isolation. Protective factors for this patient include: positive social support and responsibility to others (children, family). Considering these factors, the overall suicide risk at this point appears to be low. Patient is appropriate for outpatient follow up  Per EDP's :    Pt states he had promethazine  liquid for nausea.  He had a bottle that was left over from a prescription.  He decided to empty a bottle because his mom was asking him to clean up the area but he he decided to put it in a glass on the counter in case he was going to need it later.  Pt states he left the glass on the counter while he was at work today.  WHe he got home he poured himself something to drink and used that same glass not realizing he had the promethazine  there.  Pt thought it was left over cranberry juice. Pt states he then called 911.  He thinks it was 2/3 of the bottle.  Patient states he is feeling somewhat nauseated now.  He feels like he is having some visual hallucinations.  He is adamant that he was not trying to harm himself.  He was recently admitted to the hospital for suicidal ideation.     Upon evaluation with this clinician, the patient is alert, oriented x 3, and cooperative. Speech is clear, coherent and logical. Pt appears casual. Eye contact is fair. Mood is anxious; affect is congruent with mood. The thought process is logical and thought content is coherent. Pt denies SI/HI/AVH. There is no indication that the patient is responding to internal stimuli. No delusions elicited during this assessment.     Chief Complaint:   Chief Complaint  Patient presents with   Ingestion   Visit Diagnosis:   Major Depressive Disorder    CCA Screening, Triage and Referral (STR)  Patient Reported Information How did you hear about us ? -- ([facility] ED)  What Is the Reason for Your Visit/Call Today? Per EDP's :    Pt states he had promethazine  liquid for nausea.  He had a bottle that was left over from a prescription.  He decided to empty a bottle because his mom was asking him to clean up the area but he he decided to put it in a glass on the counter in case he was going to need it later.  Pt states he left the glass on the counter while he was at work today.  WHe he got home he poured himself something to drink and used that same glass not realizing he had the promethazine  there.  Pt thought it was left over cranberry juice. Pt states he then called 911.  He thinks it was 2/3 of the bottle.  Patient states he is feeling somewhat nauseated now.  He feels like he is having some visual hallucinations.  He is adamant that he was not trying to harm himself.  He was recently admitted to the hospital for suicidal ideation.     How Long Has This Been Causing You Problems? <Week  What Do You Feel Would Help You the Most Today? Treatment for Depression or other mood problem   Have You Recently  Had Any Thoughts About Hurting Yourself? No  Are You Planning to Commit Suicide/Harm Yourself At This time? No   Flowsheet Row ED from 03/31/2024 in [facility] Department at [facility] Admission (Discharged) from 03/12/2024 in [facility] INPATIENT BEH[facility] MEDICINE ED from 03/11/2024 in [facility]  C-SSRS RISK CATEGORY High Risk High Risk High Risk    Have you Recently Had Thoughts About Hurting Someone [place]? No  Are You Planning to Harm Someone at This Time? No  Explanation: Pt denies HI   Have You Used Any Alcohol or Drugs in the Past 24 Hours? Yes  How Long Ago Did You Use Drugs or Alcohol?  earlier last night  What Did You Use and How Much? 2 beers   Do You Currently Have a Therapist/Psychiatrist? No (Pt reports initial appt at [facility] on Wednesday)  Name of Therapist/Psychiatrist:    Have You Been Recently Discharged From Any Office Practice or Programs? No  Explanation of Discharge From Practice/Program: N/A     CCA Screening Triage Referral Assessment Type of Contact: Tele-Assessment  Telemedicine Service Delivery: Telemedicine service delivery: This service was provided via telemedicine using a 2-way, interactive audio and video technology  Is this Initial or Reassessment? Is this Initial or Reassessment?: Initial Assessment  Date Telepsych consult ordered in [facility]:  Date Telepsych consult ordered in [facility]: 03/31/24  Time Telepsych consult ordered in [facility]:  Time Telepsych consult ordered in [facility]: 2150  Location of Assessment: [facility] ED  Provider Location: [facility] Assessment Services   Collateral Involvement: none   Does Patient Have a Automotive engineer Guardian? No  Legal Guardian Contact Information: n/a  Copy of Legal Guardianship Form: -- (n/a)  Legal Guardian Notified of Arrival: -- (n/a)  Legal Guardian Notified of Pending Discharge: -- (n/a)  If Minor and Not Living with Parent(s), Who has Custody? n/a  Is CPS involved or ever been involved? Never  Is APS involved or ever been involved? Never   Patient Determined To Be At Risk for Harm To Self or Others Based on Review of Patient Reported Information or Presenting Complaint? No  Method: No Plan  Availability of Means: No access or NA  Intent: Vague intent or NA  Notification Required: Another person is identifiable and needs to be warned to ensure safety (DUTY TO WARN)  Additional Information for Danger to Others Potential: -- (n/a)  Additional Comments for Danger to Others Potential: n/a  Are There Guns or Other Weapons in Your Home? No  Types of Guns/Weapons: n/a  Are These  Weapons Safely Secured?                            -- (n/a)  Who Could Verify You Are Able To Have These Secured: n/a  Do You Have any Outstanding Charges, Pending Court Dates, Parole/Probation? Denies pending legal charges  Contacted To Inform of Risk of Harm To Self or Others: Other: Comment    Does Patient Present under Involuntary Commitment? No    [place] of Residence: [place]   Patient Currently Receiving the Following Services: Medication Management   Determination of Need: Urgent (48 hours)   Options For Referral: Outpatient Therapy; Inpatient Hospitalization; Medication Management     CCA Biopsychosocial Patient Reported Schizophrenia/Schizoaffective Diagnosis in Past: No   Strengths: self-awareness   Mental Health Symptoms Depression:  Hopelessness   Duration of Depressive symptoms: Duration of Depressive Symptoms: Greater than two weeks  Mania:  None   Anxiety:   Difficulty concentrating; Sleep; Restlessness; Worrying; Tension; Fatigue   Psychosis:  None   Duration of Psychotic symptoms:    Trauma:  None   Obsessions:  None   Compulsions:  None   Inattention:  None   Hyperactivity/Impulsivity:  None   Oppositional/Defiant Behaviors:  None   Emotional Irregularity:  Recurrent suicidal behaviors/gestures/threats   Other Mood/Personality Symptoms:  n/a    Mental Status Exam Appearance and self-care  Stature:  Average   Weight:  Average weight   Clothing:  Casual   Grooming:  Normal   Cosmetic use:  None   Posture/gait:  Normal   Motor activity:  Not Remarkable   Sensorium  Attention:  Normal   Concentration:  Normal   Orientation:  X5   Recall/memory:  Normal   Affect and Mood  Affect:  Depressed   Mood:  Depressed   Relating  Eye contact:  Normal   Facial expression:  Responsive; Depressed   Attitude toward examiner:  Cooperative   Thought and Language  Speech flow: Clear and Coherent   Thought content:   Appropriate to Mood and Circumstances   Preoccupation:  None   Hallucinations:  None   Organization:  Coherent   [employer] of Knowledge:  Average   Intelligence:  Average   Abstraction:  Normal   Judgement:  Normal   Reality Testing:  Adequate   Insight:  Fair   Decision Making:  Impulsive   Social Functioning  Social Maturity:  Impulsive; Isolates   Social Judgement:  Normal   Stress  Stressors:  Surveyor, quantity; Relationship; Work; Family conflict; Grief/losses   Coping Ability:  Deficient supports; Overwhelmed   Skill Deficits:  Self-control; Communication; Decision making   Supports:  Family; Support needed     Religion: Religion/Spirituality Are You A Religious Person?: Yes What is Your Religious Affiliation?: Christian How Might This Affect Treatment?: none  Leisure/Recreation: Leisure / Recreation Do You Have Hobbies?: Yes Leisure and Hobbies: Being outdoors: hiking, fishing, hunting. Listening to music, read, talk to friends, shoot pool sometimes.  Exercise/Diet: Exercise/Diet Do You Exercise?: No Have You Gained or Lost A Significant Amount of Weight in the Past Six Months?: No Do You Follow a Special Diet?: No Do You Have Any Trouble Sleeping?: Yes Explanation of Sleeping Difficulties: sporadic   CCA Employment/Education Employment/Work Situation: Employment / Work Situation Employment Situation: Employed Work Stressors: none reported Patient's Job has Been Impacted by Current Illness: No Has Patient ever Been in Equities trader?: No  Education: Education Is Patient Currently Attending School?: No Last Grade Completed: 12 Did You Product manager?: No Did You Have An Individualized Education Program (IIEP): No Did You Have Any Difficulty At [employer]?: Yes Were Any Medications Ever Prescribed For These Difficulties?: No Patient's Education Has Been Impacted by Current Illness: No   CCA Family/Childhood History Family and  Relationship History: Family history Marital status: Single Does patient have children?: No  Childhood History:  Childhood History By whom was/is the patient raised?: Both parents, Mother (pt primarily raised by mother. his father passed away at an early age) Description of patient's current relationship with siblings: UTA Did patient suffer any verbal/emotional/physical/sexual abuse as a child?: No Did patient suffer from severe childhood neglect?: No Has patient ever been sexually abused/assaulted/raped as an adolescent or adult?: No Was the patient ever a victim of a crime or a disaster?: No Witnessed domestic violence?: No Has patient been affected by domestic violence  as an adult?: No       CCA Substance Use Alcohol/Drug Use: Alcohol / Drug Use Pain Medications: See MAR Prescriptions: See MAR Over the Counter: See MAR History of alcohol / drug use?: Yes Longest period of sobriety (when/how long): 2020-2022 Negative Consequences of Use: Financial Withdrawal Symptoms: None Substance #1 Name of Substance 1: ETOH / Beers 1 - Amount (size/oz): 2 cans 1 - Frequency: twice a week 1 - Last Use / Amount: yesterday                       ASAM's:  Six Dimensions of Multidimensional Assessment  Dimension 1:  Acute Intoxication and/or Withdrawal Potential:   Dimension 1:  Description of individual's past and current experiences of substance use and withdrawal: He reports history of ETOH, THC, Meth and Xanax. He reports history of DT's, nausea, vomiting, sweating, and shaking when withdrawing from substances. Patient currently reports using alcohol daily for the past 1 month, 1 pint to a 5th of liquor.  Dimension 2:  Biomedical Conditions and Complications:   Dimension 2:  Description of patient's biomedical conditions and  complications: Gastritis, and history of DT's.  Dimension 3:  Emotional, Behavioral, or Cognitive Conditions and Complications:  Dimension 3:   Description of emotional, behavioral, or cognitive conditions and complications: SI with plan to jump in traffic.  Dimension 4:  Readiness to Change:  Dimension 4:  Description of Readiness to Change criteria: Seeking help  Dimension 5:  Relapse, Continued use, or Continued Problem Potential:  Dimension 5:  Relapse, continued use, or continued problem potential critiera description: He reports sobriety from 2020-2022, but does have a significant history of relapsing.  Dimension 6:  Recovery/Living Environment:  Dimension 6:  Recovery/Iiving environment criteria description: He lives with his mother and she is supportive.  ASAM Severity Score: ASAM's Severity Rating Score: 6  ASAM Recommended Level of Treatment: ASAM Recommended Level of Treatment: Level I Outpatient Treatment   Substance use Disorder (SUD) Substance Use Disorder (SUD)  Checklist Symptoms of Substance Use: Continued use despite having a persistent/recurrent physical/psychological problem caused/exacerbated by use  Recommendations for Services/Supports/Treatments: Recommendations for Services/Supports/Treatments Recommendations For Services/Supports/Treatments: Inpatient Hospitalization, Medication Management, Individual Therapy  Disposition Recommendation per psychiatric provider: Continuous Observation    DSM5 Diagnoses: Patient Active Problem List   Diagnosis Date Noted   Alcohol use disorder, severe, dependence (HCC) 03/12/2024   Major depressive disorder, recurrent severe without psychotic features (HCC) 08/02/2023   Drug overdose, intentional self-harm, initial encounter (HCC) 10/07/2019   Alcoholic intoxication without complication (HCC)    Suicide attempt (HCC)    Viral illness 08/21/2019   Hypokalemia 06/23/2019   Attention deficit hyperactivity disorder (ADHD) 06/23/2019   Seizure-like activity (HCC) 06/23/2019   History of alcohol abuse 06/23/2019   Chronic migraine 06/23/2019   Diarrhea 06/23/2019      Referrals to Alternative Service(s): Referred to Alternative Service(s):   Place:   Date:   Time:    Referred to Alternative Service(s):   Place:   Date:   Time:    Referred to Alternative Service(s):   Place:   Date:   Time:    Referred to Alternative Service(s):   Place:   Date:   Time:     Rosina PARAS, [place], [facility]

## 2024-04-01 NOTE — Consult Note (Signed)
 [facility] Psychiatric Consult Initial  Patient Name: .Brett Sanford  MRN: [id number]  DOB: May 31, 1991  Consult Order details:  Orders (From admission, onward)     Start     Ordered   04/01/24 1127  CONSULT TO CALL ACT TEAM       Ordering Provider: Dreama Longs, MD  Provider:  (Not yet assigned)  Question:  Reason for Consult?  Answer:  Psych consult   04/01/24 1126   04/01/24 0155  CONSULT TO CALL ACT TEAM       Ordering Provider: Trine Raynell Moder, MD  Provider:  (Not yet assigned)  Question:  Reason for Consult?  Answer:  drug overdose   04/01/24 0154   03/31/24 2150  CONSULT TO CALL ACT TEAM       Ordering Provider: Randol Simmonds, MD  Provider:  (Not yet assigned)  Question:  Reason for Consult?  Answer:  overdose,   03/31/24 2150             Mode of Visit: In person    Psychiatry Consult Evaluation  Service Date: April 01, 2024 LOS:  LOS: 0 days  Chief Complaint I accidentally overdosed on phenergan   Primary Psychiatric Diagnoses  MDD 2.  Substance Abuse   Assessment  AURTHUR WINGERTER is a 33 y.o. male admitted: Presented to the [facility] 03/31/2024  8:01 PM after drinking a cup of Phenergan  which he calls accidental overdose.  He also had been drinking alcohol BAL 130 upon presentation to ED. He carries the psychiatric diagnoses of  MDD and polysubstance abuse and was recently treated at [facility] for the same symptoms of depression and substance abuse.  SABRA   His current presentation of increased anxiety, sadness, tremors, restlessness and helplessness  is most consistent with his hx of depression and substance abuse. SABRA He meets criteria for Inpatient to [facility] for  stabilization,  based on presenting symptoms.  Current outpatient psychotropic medications include Wellbutrin , Propranolol  and historically he has had a therapeutic  response to these medications. He reports he was  compliant with medications prior to admission . On initial examination, patient presents  with increased anxiety, tremors, restlessness, helplessness, guilt and remorse related to his relapse on alcohol and other substances.  Please see plan below for detailed recommendations.   Diagnoses:  Active Hospital problems: Active Problems:   * No active hospital problems. *    Plan   ## Psychiatric Medication Recommendations:  Continue Wellbutrin  XL 150 mg PO Daily, Inderall 10 mg PO Q 6 PRN. Continue Librium  detox protocol  ## Medical Decision Making Capacity: Not specifically addressed in this encounter  ## Further Work-up:  -- To be determined  -- most recent EKG: NA -- Pertinent labwork reviewed earlier this admission includes: All labs reviewed   ## Disposition:-- We recommend inpatient psychiatric hospitalization when medically cleared. Patient is under voluntary admission status at this time; please IVC if attempts to leave hospital.  ## Behavioral / Environmental: - No specific recommendations at this time.     ## Safety and Observation Level:  - Based on my clinical evaluation, I estimate the patient to be at high risk of self harm in the current setting. - At this time, we recommend  routine. This decision is based on my review of the chart including patient's history and current presentation, interview of the patient, mental status examination, and consideration of suicide risk including evaluating suicidal ideation, plan, intent, suicidal or self-harm behaviors, risk factors, and protective factors. This  judgment is based on our ability to directly address suicide risk, implement suicide prevention strategies, and develop a safety plan while the patient is in the clinical setting. Please contact our team if there is a concern that risk level has changed.  CSSR Risk Category:C-SSRS RISK CATEGORY: Error: Q6 is Yes, you must answer 7  Suicide Risk Assessment: Patient has following modifiable risk factors for suicide: under treated depression  and social isolation,  which we are addressing by recommending inpatient to [facility] for treatment. Patient has following non-modifiable or demographic risk factors for suicide: male gender, history of suicide attempt, history of self harm behavior, and psychiatric hospitalization Patient has the following protective factors against suicide: Access to outpatient mental health care, Supportive family, and Pets in the home  Thank you for this consult request. Recommendations have been communicated to the primary team.  We will recommend treatment at [facility] to address depression and substance abuse at this time.   Randall Bouquet, NP       History of Present Illness  Relevant Aspects of Hospital ED Course:  Admitted on 03/31/2024 for accidental overdose.    Patient Report:  Patient is seen face to face by this provider. 33 year-old male sitting in his bed, anxious, sad., depressed. Currently experiencing withdrawal symptoms including moderate tremors, anxiety, abdominal discomfort, nausea and anxiety: Librium  detox protocol in process. He is alert and oriented x 4. Appears disheveled and restless. Thought process is coherent and goal-directed. Eye contact is fair. Patient does not appear to be preoccupied or responding to internal stimuli. He denies hallucinations. Denies HI. Reports no SI, that he had started to establish with [facility] for outpatient services. Reports that I accidentally overdosed on Phenergan . Reports he was at home alone as his mother is at the beach and was drinking alcohol. Reports he accidentally drank the liquid that he found in a cup, thinking that it was a juice. When he finished, he realized it was phenergan  and called for help. Patient states I am not sure who put it there, may be my mom because she is starting to have dementia. However, mom is at the beach as pt reported.  He then states maybe its me who put it there, then forgot, I don't know.  Patient was discharged from [facility] a week ago and was  recommended to follow up at [facility]. He has made initial encounters and waiting for the therapist/psychiatrist to schedule an appointment.  He then relapsed on alcohol, leading to possible intentional suicide attempt.  He reports a hx af attempts. Reports not having support but his mom. Has no friends but has dogs at home.  Admits to endorsing depression, anxiety and remorse/guilt.  Admits to drinking alcohol as evidenced by his BAL. Based on pt' presenting symptoms and his current situation at home, he presents safety concerns. Dr Merilee is consulted and treatment recommended.  Provider discussed with pt about treatment for his depression and alcohol problem and he agrees to inpatient  admission at [facility] for treatment.   Psych ROS:  Depression: Current Anxiety:  Current Mania (lifetime and current): NA Psychosis: (lifetime and current): NA  Collateral information:  Contacted NA   Review of Systems  Constitutional: Negative.   HENT: Negative.    Eyes: Negative.   Respiratory: Negative.    Cardiovascular: Negative.   Gastrointestinal: Negative.   Genitourinary: Negative.   Musculoskeletal: Negative.   Skin: Negative.   Neurological: Negative.   Endo/Heme/Allergies: Negative.   Psychiatric/Behavioral:  Positive for depression  and substance abuse. The patient is nervous/anxious.      Psychiatric and Social History  Psychiatric History:  Information collected from Patient/chart review  Prev Dx/Sx: MDD, Substance abuse Current Psych Provider: Patient is establishing with [facility] Meds (current): Wellbutrin  and Inderall Previous Med Trials: NA Therapy: Currently establishing with [facility]  Prior Psych Hospitalization: Recent inpatient at [facility]  Prior Self Harm: Reported Prior Violence: NA  Family Psych History: NA Family Hx suicide: NA  Social History:  Developmental Hx: NA Educational Hx: NA Occupational Hx: NA Legal Hx: NA Living Situation: Lives with his  mother Spiritual Hx: NA Access to weapons/lethal means: NA   Substance History Alcohol: reports past and Current use  Type of alcohol Beer Last Drink Today Number of drinks per day 3-4 History of alcohol withdrawal seizures NA History of DT's Reported Tobacco: NA Illicit drugs: Reported Prescription drug abuse: NA Rehab hx: Not reported  Exam Findings  Physical Exam:  Vital Signs:  Temp:  [97.4 F (36.3 C)-97.8 F (36.6 C)] 97.8 F (36.6 C) (06/23 1627) Pulse Rate:  [84-119] 87 (06/23 1627) Resp:  [16-29] 18 (06/23 1627) BP: (122-194)/(92-110) 122/107 (06/23 1627) SpO2:  [93 %-100 %] 100 % (06/23 1627) Blood pressure (!) 122/107, pulse 87, temperature 97.8 F (36.6 C), temperature source Oral, resp. rate 18, SpO2 100%. There is no height or weight on file to calculate BMI.  Physical Exam HENT:     Head: Normocephalic and atraumatic.     Right Ear: Tympanic membrane normal.     Left Ear: Tympanic membrane normal.     Nose: Nose normal.     Mouth/Throat:     Mouth: Mucous membranes are moist.   Eyes:     Extraocular Movements: Extraocular movements intact.     Pupils: Pupils are equal, round, and reactive to light.    Cardiovascular:     Rate and Rhythm: Normal rate.     Pulses: Normal pulses.  Pulmonary:     Effort: Pulmonary effort is normal.   Musculoskeletal:        General: Normal range of motion.     Cervical back: Normal range of motion and neck supple.   Neurological:     General: No focal deficit present.     Mental Status: He is alert and oriented to person, place, and time.     Mental Status Exam: General Appearance: Disheveled  Orientation:  Full (Time, Place, and Person)  Memory:  Immediate;   Fair Recent;   Fair Remote;   Fair  Concentration:  Concentration: Fair and Attention Span: Fair  Recall:  Fair  Attention  Fair  Eye Contact:  Fair  Speech:  Clear and Coherent  Language:  Fair  Volume:  Normal  Mood: Anxious  Affect:   Depressed  Thought Process:  Coherent  Thought Content:  WDL  Suicidal Thoughts:  passive  Homicidal Thoughts:  No  Judgement:  Fair  Insight:  Fair  Psychomotor Activity:  Restlessness  Akathisia:  NA  Fund of Knowledge:  Fair      Assets:  Manufacturing systems engineer Desire for Improvement Physical Health  Cognition:  WNL  ADL's:  Intact  AIMS (if indicated):        Other History   These have been pulled in through the EMR, reviewed, and updated if appropriate.  Family History:  The patient's Family history is unknown by patient.  Medical History: Past Medical History:  Diagnosis Date   ADD (attention deficit disorder)  Drug abuse [facility])    Medical history non-contributory     Surgical History: Past Surgical History:  Procedure Laterality Date   NO PAST SURGERIES       Medications:   Current Facility-Administered Medications:    chlordiazePOXIDE  (LIBRIUM ) capsule 25 mg, 25 mg, Oral, Q6H PRN, Dreama Longs, MD, 25 mg at 04/01/24 1145   hydrOXYzine  (ATARAX ) tablet 25 mg, 25 mg, Oral, Q6H PRN, Dreama Longs, MD, 25 mg at 04/01/24 1434   loperamide  (IMODIUM ) capsule 2-4 mg, 2-4 mg, Oral, PRN, Dreama Longs, MD   LORazepam  (ATIVAN ) tablet 2 mg, 2 mg, Oral, Q6H **OR** LORazepam  (ATIVAN ) tablet 0-4 mg, 0-4 mg, Oral, Q6H, Dreama Longs, MD   [START ON 04/03/2024] LORazepam  (ATIVAN ) tablet 2 mg, 2 mg, Oral, Q12H **OR** [START ON 04/03/2024] LORazepam  (ATIVAN ) tablet 0-4 mg, 0-4 mg, Oral, Q12H, Dreama Longs, MD   multivitamin with minerals tablet 1 tablet, 1 tablet, Oral, Daily, Dreama Longs, MD, 1 tablet at 04/01/24 1145   ondansetron  (ZOFRAN -ODT) disintegrating tablet 4 mg, 4 mg, Oral, Q6H PRN, Dreama Longs, MD   thiamine  (VITAMIN B1) tablet 100 mg, 100 mg, Oral, Daily, 100 mg at 04/01/24 1145 **OR** thiamine  (VITAMIN B1) injection 100 mg, 100 mg, Intravenous, Daily, Schlossman, Erin, MD   thiamine  (VITAMIN B1) injection 100 mg, 100 mg, Intramuscular,  Once, Dreama Longs, MD  Current Outpatient Medications:    buPROPion  (WELLBUTRIN  XL) 150 MG 24 hr tablet, Take 1 tablet (150 mg total) by mouth daily., Disp: 30 tablet, Rfl: 0   omeprazole (PRILOSEC OTC) 20 MG tablet, Take 20 mg by mouth daily as needed., Disp: , Rfl:    promethazine  (PHENERGAN ) 6.25 MG/5ML solution, Take by mouth once., Disp: , Rfl:    propranolol  (INDERAL ) 10 MG tablet, Take 1 tablet (10 mg total) by mouth every 6 (six) hours as needed for up to 15 days (anxiety)., Disp: 60 tablet, Rfl: 0  Allergies: Allergies  Allergen Reactions   Benadryl [Diphenhydramine Hcl] Other (See Comments)    Caused hyperactivity   Metoclopramide  Other (See Comments)    Adonica Randall Bouquet, NP

## 2024-04-01 NOTE — BH Assessment (Signed)
 At this time [facility] has not given a consult time. TTS is available to see pt. [facility] consult cancelled via secure chat.

## 2024-04-02 ENCOUNTER — Other Ambulatory Visit (HOSPITAL_COMMUNITY)
Admission: EM | Admit: 2024-04-02 | Discharge: 2024-04-03 | Disposition: A | Discharge status: Home / Self Care | Attending: Psychiatry | Admitting: Psychiatry

## 2024-04-02 DIAGNOSIS — T50901A Poisoning by unspecified drugs, medicaments and biological substances, accidental (unintentional), initial encounter: Secondary | ICD-10-CM

## 2024-04-02 DIAGNOSIS — F1910 Other psychoactive substance abuse, uncomplicated: Secondary | ICD-10-CM | POA: Diagnosis not present

## 2024-04-02 DIAGNOSIS — T433X1A Poisoning by phenothiazine antipsychotics and neuroleptics, accidental (unintentional), initial encounter: Secondary | ICD-10-CM | POA: Diagnosis not present

## 2024-04-02 DIAGNOSIS — F1020 Alcohol dependence, uncomplicated: Secondary | ICD-10-CM | POA: Diagnosis not present

## 2024-04-02 DIAGNOSIS — F1010 Alcohol abuse, uncomplicated: Secondary | ICD-10-CM | POA: Diagnosis present

## 2024-04-02 DIAGNOSIS — F411 Generalized anxiety disorder: Secondary | ICD-10-CM | POA: Diagnosis not present

## 2024-04-02 DIAGNOSIS — F1290 Cannabis use, unspecified, uncomplicated: Secondary | ICD-10-CM

## 2024-04-02 DIAGNOSIS — F339 Major depressive disorder, recurrent, unspecified: Secondary | ICD-10-CM

## 2024-04-02 MED ORDER — HYDROXYZINE HCL 25 MG PO TABS
25.0000 mg | ORAL_TABLET | Freq: Four times a day (QID) | ORAL | Status: DC | PRN
  Administered 2024-04-02: 25 mg via ORAL
  Filled 2024-04-02: qty 1

## 2024-04-02 MED ORDER — ALUM & MAG HYDROXIDE-SIMETH 200-200-20 MG/5ML PO SUSP: 30.0000 mL | ORAL | Status: DC | PRN

## 2024-04-02 MED ORDER — OMEPRAZOLE MAGNESIUM 20 MG PO TBEC: 20.0000 mg | DELAYED_RELEASE_TABLET | Freq: Every day | ORAL | Status: DC | PRN

## 2024-04-02 MED ORDER — THIAMINE MONONITRATE 100 MG PO TABS
100.0000 mg | ORAL_TABLET | Freq: Every day | ORAL | Status: DC
  Administered 2024-04-02 – 2024-04-03 (×2): 100 mg via ORAL
  Filled 2024-04-02 (×4): qty 1

## 2024-04-02 MED ORDER — HYDROXYZINE HCL 25 MG PO TABS: 25.0000 mg | ORAL_TABLET | Freq: Four times a day (QID) | ORAL | Status: DC | PRN

## 2024-04-02 MED ORDER — OLANZAPINE 10 MG IM SOLR: 10.0000 mg | Freq: Three times a day (TID) | INTRAMUSCULAR | Status: DC | PRN

## 2024-04-02 MED ORDER — ACETAMINOPHEN 325 MG PO TABS: 650.0000 mg | ORAL_TABLET | Freq: Four times a day (QID) | ORAL | Status: DC | PRN

## 2024-04-02 MED ORDER — LOPERAMIDE HCL 2 MG PO CAPS: 2.0000 mg | ORAL_CAPSULE | ORAL | Status: DC | PRN

## 2024-04-02 MED ORDER — PROPRANOLOL HCL 10 MG PO TABS
10.0000 mg | ORAL_TABLET | Freq: Four times a day (QID) | ORAL | Status: DC | PRN
  Administered 2024-04-02 – 2024-04-03 (×2): 10 mg via ORAL
  Filled 2024-04-02 (×2): qty 1

## 2024-04-02 MED ORDER — DICYCLOMINE HCL 20 MG PO TABS: 20.0000 mg | ORAL_TABLET | Freq: Four times a day (QID) | ORAL | Status: DC | PRN

## 2024-04-02 MED ORDER — THIAMINE HCL 100 MG/ML IJ SOLN: 100.0000 mg | Freq: Once | INTRAMUSCULAR | Status: DC

## 2024-04-02 MED ORDER — ONDANSETRON 4 MG PO TBDP: 4.0000 mg | ORAL_TABLET | Freq: Four times a day (QID) | ORAL | Status: DC | PRN

## 2024-04-02 MED ORDER — CHLORDIAZEPOXIDE HCL 25 MG PO CAPS: 25.0000 mg | ORAL_CAPSULE | Freq: Four times a day (QID) | ORAL | Status: DC | PRN

## 2024-04-02 MED ORDER — MAGNESIUM HYDROXIDE 400 MG/5ML PO SUSP: 30.0000 mL | Freq: Every day | ORAL | Status: DC | PRN

## 2024-04-02 MED ORDER — OLANZAPINE 5 MG PO TBDP: 5.0000 mg | ORAL_TABLET | Freq: Three times a day (TID) | ORAL | Status: DC | PRN

## 2024-04-02 MED ORDER — ADULT MULTIVITAMIN W/MINERALS CH
1.0000 | ORAL_TABLET | Freq: Every day | ORAL | Status: DC
  Administered 2024-04-02 – 2024-04-03 (×2): 1 via ORAL
  Filled 2024-04-02 (×2): qty 1

## 2024-04-02 MED ORDER — OLANZAPINE 10 MG IM SOLR: 5.0000 mg | Freq: Three times a day (TID) | INTRAMUSCULAR | Status: DC | PRN

## 2024-04-02 MED ORDER — SERTRALINE HCL 25 MG PO TABS
25.0000 mg | ORAL_TABLET | Freq: Every day | ORAL | Status: DC
  Administered 2024-04-02: 25 mg via ORAL
  Filled 2024-04-02: qty 1

## 2024-04-02 MED ORDER — METHOCARBAMOL 500 MG PO TABS: 500.0000 mg | ORAL_TABLET | Freq: Three times a day (TID) | ORAL | Status: DC | PRN

## 2024-04-02 MED ORDER — NAPROXEN 500 MG PO TABS: 500.0000 mg | ORAL_TABLET | Freq: Two times a day (BID) | ORAL | Status: DC | PRN

## 2024-04-02 MED ORDER — PANTOPRAZOLE SODIUM 40 MG PO TBEC
40.0000 mg | DELAYED_RELEASE_TABLET | Freq: Every day | ORAL | Status: DC | PRN
  Administered 2024-04-03: 40 mg via ORAL
  Filled 2024-04-02: qty 1

## 2024-04-02 NOTE — ED Provider Notes (Signed)
 Behavioral Health Progress Note  Date and Time: 04/02/2024 12:00 PM Name: Brett Sanford MRN:  [id number]  Subjective:  Brett Sanford is a  33 y.o. male with a past psychiatric history of major depressive disorder, generalized anxiety, alcohol use disorder and marijuana use who presented to the [facility] emergency department for accidental overdose of undetermined intent on home promethazine .  On morning assessment, patient denies that ingestion of promethazine  was an attempt on his life.  Patient reports that he had fleeting thoughts of suicide 1-2 weeks ago.  He denies any active intent or plan to commit suicide. Patient reports that he has been compliant on his medications since discharge from [facility] earlier this month. He has taken Wellbutrin , propranolol  and omeprazole without any complications.  He does report some mild depressive symptoms including low mood, feelings of hopelessness, and thoughts of self-hate within the last 2 weeks.  Patient does report that his symptoms have been improving since last hospitalization. Patient also reports that he is anxious about everything.  Denies any episodes suggestive of mania or hypomania.  He denies auditory or visual hallucinations and patient does not appear to be paranoid or responding to internal stimuli on my exam.  Regarding substances, patient reports that his last drink was Sunday where he had 3 shots of liquor.  Last week, he drank every other day, with 1 day last week drinking approximately 1 pint of liquor.  Patient reports some withdrawal symptoms including tremors, worsening anxiety, abdominal discomfort and nausea earlier this morning. Patient reports improvement and only mild tremors this morning upon my assessment.  Patient denies a history of withdrawal seizures or auditory or visual hallucinations while withdrawing from alcohol.  Patient reports that prior to last hospitalization  earlier this month, the patient was drinking approximately 1/5 of liquor daily.  The main trigger for increased frequency of alcohol intake was the death of his aunt in 2024-03-20. Patient reports occasional marijuana use x2-3 a week. It will take him 2 weeks to finish 3.5 ounces.    Diagnosis:  Final diagnoses:  Recurrent major depressive disorder, remission status unspecified (HCC)  Alcohol use disorder, severe, dependence (HCC)  Marijuana use  Generalized anxiety disorder   Total Time spent with patient: 30 minutes  Past Psychiatric History: MDD, GAD, ADHD.  Patient currently on Wellbutrin  and propranolol  for psychotropic medication.  Patient previously attempted to establish with [facility], however was unable to continue financial reasons.  Patient is establishing with [facility] for outpatient group therapy and plans to have medications managed there also. Past Medication Trials: Venlafaxine ( made me feel weird), fluoxetine , sertraline , Vyvanse, Klonopin Past Medical History: per patient report Gastritis Family History: Unknown Family Psychiatric  History: Sister ADHD, brother alcohol, ADD and MDD, maternal aunt with prior suicide attempt Social History: Patient was raised in [place]  and did not have any issues growing up with developmentally.  Patient graduated from high school and currently works at [employer].  Patient never been married.  Patient feels like he has his sister, her friend Bernardino and his mom for support.  Patient denies access to firearms.   Additional Social History:  Patient reports previous treatment and rehab facility at [facility] in 2013.  Patient has been looking into Alcoholics Anonymous groups in the area.  Also interested in the [facility] to optimize sobriety.  Sleep: Fair  Appetite:  Good  Current Medications:  Current Facility-Administered Medications  Medication Dose Route Frequency Provider Last Rate  Last Admin   acetaminophen  (TYLENOL ) tablet 650 mg   650 mg Oral Q6H PRN Trudy Carwin, NP       alum & mag hydroxide-simeth (MAALOX/MYLANTA) 200-200-20 MG/5ML suspension 30 mL  30 mL Oral Q4H PRN Trudy Carwin, NP       chlordiazePOXIDE  (LIBRIUM ) capsule 25 mg  25 mg Oral Q6H PRN Trudy Carwin, NP       dicyclomine  (BENTYL ) tablet 20 mg  20 mg Oral Q6H PRN Trudy Carwin, NP       hydrOXYzine  (ATARAX ) tablet 25 mg  25 mg Oral Q6H PRN Trudy Carwin, NP       hydrOXYzine  (ATARAX ) tablet 25 mg  25 mg Oral Q6H PRN Trudy Carwin, NP       loperamide  (IMODIUM ) capsule 2-4 mg  2-4 mg Oral PRN Trudy Carwin, NP       loperamide  (IMODIUM ) capsule 2-4 mg  2-4 mg Oral PRN Trudy Carwin, NP       magnesium  hydroxide (MILK OF MAGNESIA) suspension 30 mL  30 mL Oral Daily PRN Trudy Carwin, NP       methocarbamol  (ROBAXIN ) tablet 500 mg  500 mg Oral Q8H PRN Trudy Carwin, NP       multivitamin with minerals tablet 1 tablet  1 tablet Oral Daily Trudy Carwin, NP   1 tablet at 04/02/24 1044   naproxen  (NAPROSYN ) tablet 500 mg  500 mg Oral BID PRN Trudy Carwin, NP       OLANZapine  (ZYPREXA ) injection 10 mg  10 mg Intramuscular TID PRN Trudy Carwin, NP       OLANZapine  (ZYPREXA ) injection 5 mg  5 mg Intramuscular TID PRN Trudy Carwin, NP       OLANZapine  zydis (ZYPREXA ) disintegrating tablet 5 mg  5 mg Oral TID PRN Trudy Carwin, NP       ondansetron  (ZOFRAN -ODT) disintegrating tablet 4 mg  4 mg Oral Q6H PRN Trudy Carwin, NP       ondansetron  (ZOFRAN -ODT) disintegrating tablet 4 mg  4 mg Oral Q6H PRN Trudy Carwin, NP       pantoprazole  (PROTONIX ) EC tablet 40 mg  40 mg Oral Daily PRN Trudy Carwin, NP       propranolol  (INDERAL ) tablet 10 mg  10 mg Oral Q6H PRN Lenard Calin, MD       sertraline  (ZOLOFT ) tablet 25 mg  25 mg Oral Daily Lenard Calin, MD       thiamine  (VITAMIN B1) tablet 100 mg  100 mg Oral Daily Lenard Calin, MD       Current Outpatient Medications  Medication Sig Dispense Refill   buPROPion  (WELLBUTRIN  XL) 150 MG 24 hr tablet Take 1  tablet (150 mg total) by mouth daily. 30 tablet 0   omeprazole (PRILOSEC OTC) 20 MG tablet Take 20 mg by mouth daily as needed.     promethazine  (PHENERGAN ) 6.25 MG/5ML solution Take by mouth once.     propranolol  (INDERAL ) 10 MG tablet Take 1 tablet (10 mg total) by mouth every 6 (six) hours as needed for up to 15 days (anxiety). 60 tablet 0    Labs  Lab Results:  Admission on 03/31/2024, Discharged on 04/02/2024  Component Date Value Ref Range Status   Salicylate Lvl 03/31/2024 <7.0 (L)  7.0 - 30.0 mg/dL Final   Performed at [facility], [address] [place], [place] [address]   Acetaminophen  (Tylenol ), Serum 03/31/2024 <10 (L)  10 - 30 ug/mL Final   Comment: (NOTE) Therapeutic concentrations vary  significantly. A range of 10-30 ug/mL  may be an effective concentration for many patients. However, some  are best treated at concentrations outside of this range. Acetaminophen  concentrations >150 ug/mL at 4 hours after ingestion  and >50 ug/mL at 12 hours after ingestion are often associated with  toxic reactions.  Performed at [facility], [address] [place], [place] [address]    Alcohol, Ethyl (B) 03/31/2024 130 (H)  <15 mg/dL Final   Comment: (NOTE) For medical purposes only. Performed at [facility], [address] [place], [place] [address]    Opiates 03/31/2024 NONE DETECTED  NONE DETECTED Final   Cocaine 03/31/2024 NONE DETECTED  NONE DETECTED Final   Benzodiazepines 03/31/2024 POSITIVE (A)  NONE DETECTED Final   Amphetamines 03/31/2024 NONE DETECTED  NONE DETECTED Final   Tetrahydrocannabinol 03/31/2024 POSITIVE (A)  NONE DETECTED Final   Barbiturates 03/31/2024 NONE DETECTED  NONE DETECTED Final   Comment: (NOTE) DRUG SCREEN FOR MEDICAL PURPOSES ONLY.  IF CONFIRMATION IS NEEDED FOR ANY PURPOSE, NOTIFY LAB WITHIN 5 DAYS.  LOWEST DETECTABLE LIMITS FOR URINE DRUG SCREEN Drug Class                      Cutoff (ng/mL) Amphetamine and metabolites    1000 Barbiturate and metabolites    200 Benzodiazepine                 200 Opiates and metabolites        300 Cocaine and metabolites        300 THC                            50 Performed at [facility], [address] [place], [place] [address]    WBC 03/31/2024 8.4  4.0 - 10.5 K/uL Final   RBC 03/31/2024 4.96  4.22 - 5.81 MIL/uL Final   Hemoglobin 03/31/2024 16.5  13.0 - 17.0 g/dL Final   HCT 93/77/7974 48.5  39.0 - 52.0 % Final   MCV 03/31/2024 97.8  80.0 - 100.0 fL Final   MCH 03/31/2024 33.3  26.0 - 34.0 pg Final   MCHC 03/31/2024 34.0  30.0 - 36.0 g/dL Final   RDW 93/77/7974 12.8  11.5 - 15.5 % Final   Platelets 03/31/2024 306  150 - 400 K/uL Final   nRBC 03/31/2024 0.0  0.0 - 0.2 % Final   Neutrophils Relative % 03/31/2024 58  % Final   Neutro Abs 03/31/2024 4.9  1.7 - 7.7 K/uL Final   Lymphocytes Relative 03/31/2024 32  % Final   Lymphs Abs 03/31/2024 2.7  0.7 - 4.0 K/uL Final   Monocytes Relative 03/31/2024 7  % Final   Monocytes Absolute 03/31/2024 0.6  0.1 - 1.0 K/uL Final   Eosinophils Relative 03/31/2024 1  % Final   Eosinophils Absolute 03/31/2024 0.1  0.0 - 0.5 K/uL Final   Basophils Relative 03/31/2024 1  % Final   Basophils Absolute 03/31/2024 0.1  0.0 - 0.1 K/uL Final   Immature Granulocytes 03/31/2024 1  % Final   Abs Immature Granulocytes 03/31/2024 0.09 (H)  0.00 - 0.07 K/uL Final   Performed at [facility], [address] [place], [place] [address]   Sodium 03/31/2024 137  135 - 145 mmol/L Final   Potassium 03/31/2024 3.5  3.5 - 5.1 mmol/L Final   Chloride 03/31/2024 103  98 - 111 mmol/L  Final   CO2 03/31/2024 20 (L)  22 - 32 mmol/L Final   Glucose, Bld 03/31/2024 156 (H)  70 - 99 mg/dL Final   Glucose reference range applies only to samples taken after fasting for at least 8 hours.   BUN 03/31/2024 18  6 - 20 mg/dL Final   Creatinine, Ser 03/31/2024 0.92  0.61 - 1.24  mg/dL Final   Calcium  03/31/2024 8.8 (L)  8.9 - 10.3 mg/dL Final   Total Protein 93/77/7974 6.7  6.5 - 8.1 g/dL Final   Albumin 93/77/7974 3.8  3.5 - 5.0 g/dL Final   AST 93/77/7974 30  15 - 41 U/L Final   ALT 03/31/2024 24  0 - 44 U/L Final   Alkaline Phosphatase 03/31/2024 48  38 - 126 U/L Final   Total Bilirubin 03/31/2024 1.0  0.0 - 1.2 mg/dL Final   GFR, Estimated 03/31/2024 >60  >60 mL/min Final   Comment: (NOTE) Calculated using the CKD-EPI Creatinine Equation (2021)    Anion gap 03/31/2024 14  5 - 15 Final   Performed at [facility], [address], [place], [place] [address]  Admission on 03/11/2024, Discharged on 03/12/2024  Component Date Value Ref Range Status   WBC 03/11/2024 7.3  4.0 - 10.5 K/uL Final   RBC 03/11/2024 5.72  4.22 - 5.81 MIL/uL Final   Hemoglobin 03/11/2024 19.0 (H)  13.0 - 17.0 g/dL Final   HCT 93/97/7974 54.7 (H)  39.0 - 52.0 % Final   MCV 03/11/2024 95.6  80.0 - 100.0 fL Final   MCH 03/11/2024 33.2  26.0 - 34.0 pg Final   MCHC 03/11/2024 34.7  30.0 - 36.0 g/dL Final   RDW 93/97/7974 12.3  11.5 - 15.5 % Final   Platelets 03/11/2024 359  150 - 400 K/uL Final   nRBC 03/11/2024 0.0  0.0 - 0.2 % Final   Neutrophils Relative % 03/11/2024 52  % Final   Neutro Abs 03/11/2024 3.8  1.7 - 7.7 K/uL Final   Lymphocytes Relative 03/11/2024 35  % Final   Lymphs Abs 03/11/2024 2.6  0.7 - 4.0 K/uL Final   Monocytes Relative 03/11/2024 10  % Final   Monocytes Absolute 03/11/2024 0.7  0.1 - 1.0 K/uL Final   Eosinophils Relative 03/11/2024 1  % Final   Eosinophils Absolute 03/11/2024 0.1  0.0 - 0.5 K/uL Final   Basophils Relative 03/11/2024 1  % Final   Basophils Absolute 03/11/2024 0.1  0.0 - 0.1 K/uL Final   Immature Granulocytes 03/11/2024 1  % Final   Abs Immature Granulocytes 03/11/2024 0.05  0.00 - 0.07 K/uL Final   Performed at [facility], [address], [place], [place] [address]   Sodium 03/11/2024 137  135 - 145 mmol/L Final    Potassium 03/11/2024 3.8  3.5 - 5.1 mmol/L Final   Chloride 03/11/2024 95 (L)  98 - 111 mmol/L Final   CO2 03/11/2024 25  22 - 32 mmol/L Final   Glucose, Bld 03/11/2024 95  70 - 99 mg/dL Final   Glucose reference range applies only to samples taken after fasting for at least 8 hours.   BUN 03/11/2024 7  6 - 20 mg/dL Final   Creatinine, Ser 03/11/2024 1.10  0.61 - 1.24 mg/dL Final   Calcium  03/11/2024 10.4 (H)  8.9 - 10.3 mg/dL Final   Total Protein 93/97/7974 7.7  6.5 - 8.1 g/dL Final   Albumin 93/97/7974 4.5  3.5 - 5.0 g/dL Final  AST 03/11/2024 130 (H)  15 - 41 U/L Final   ALT 03/11/2024 109 (H)  0 - 44 U/L Final   Alkaline Phosphatase 03/11/2024 59  38 - 126 U/L Final   Total Bilirubin 03/11/2024 1.6 (H)  0.0 - 1.2 mg/dL Final   GFR, Estimated 03/11/2024 >60  >60 mL/min Final   Comment: (NOTE) Calculated using the CKD-EPI Creatinine Equation (2021)    Anion gap 03/11/2024 17 (H)  5 - 15 Final   Performed at [facility], [address], [place], [place] [address]   Alcohol, Ethyl (B) 03/11/2024 195 (H)  <15 mg/dL Final   Comment: (NOTE) For medical purposes only. Performed at [facility], [address], [place], [place] [address]    TSH 03/11/2024 2.348  0.350 - 4.500 uIU/mL Final   Comment: Performed by a 3rd Generation assay with a functional sensitivity of <=0.01 uIU/mL. Performed at [facility], [address], [place], [place] [address]    POC Amphetamine UR 03/11/2024 None Detected  NONE DETECTED (Cut Off Level 1000 ng/mL) Final   POC Secobarbital (BAR) 03/11/2024 None Detected  NONE DETECTED (Cut Off Level 300 ng/mL) Final   POC Buprenorphine (BUP) 03/11/2024 None Detected  NONE DETECTED (Cut Off Level 10 ng/mL) Final   POC Oxazepam (BZO) 03/11/2024 Positive (A)  NONE DETECTED (Cut Off Level 300 ng/mL) Final   POC Cocaine UR 03/11/2024 None Detected  NONE DETECTED (Cut Off Level 300 ng/mL) Final   POC Methamphetamine UR 03/11/2024 None Detected  NONE  DETECTED (Cut Off Level 1000 ng/mL) Final   POC Morphine  03/11/2024 None Detected  NONE DETECTED (Cut Off Level 300 ng/mL) Final   POC Methadone UR 03/11/2024 None Detected  NONE DETECTED (Cut Off Level 300 ng/mL) Final   POC Oxycodone UR 03/11/2024 None Detected  NONE DETECTED (Cut Off Level 100 ng/mL) Final   POC Marijuana UR 03/11/2024 Positive (A)  NONE DETECTED (Cut Off Level 50 ng/mL) Final  Admission on 02/25/2024, Discharged on 02/25/2024  Component Date Value Ref Range Status   Influenza A, POC 02/25/2024 Positive (A)  Negative Final   Influenza B, POC 02/25/2024 Negative  Negative Final  Admission on 10/11/2023, Discharged on 10/11/2023  Component Date Value Ref Range Status   Sodium 10/11/2023 137  135 - 145 mmol/L Final   Potassium 10/11/2023 3.4 (L)  3.5 - 5.1 mmol/L Final   Chloride 10/11/2023 104  98 - 111 mmol/L Final   CO2 10/11/2023 20 (L)  22 - 32 mmol/L Final   Glucose, Bld 10/11/2023 100 (H)  70 - 99 mg/dL Final   Glucose reference range applies only to samples taken after fasting for at least 8 hours.   BUN 10/11/2023 8  6 - 20 mg/dL Final   Creatinine, Ser 10/11/2023 0.81  0.61 - 1.24 mg/dL Final   Calcium  10/11/2023 9.1  8.9 - 10.3 mg/dL Final   GFR, Estimated 10/11/2023 >60  >60 mL/min Final   Comment: (NOTE) Calculated using the CKD-EPI Creatinine Equation (2021)    Anion gap 10/11/2023 13  5 - 15 Final   Performed at [facility], [address], [place], [place] [address]   WBC 10/11/2023 6.9  4.0 - 10.5 K/uL Final   RBC 10/11/2023 5.03  4.22 - 5.81 MIL/uL Final   Hemoglobin 10/11/2023 16.8  13.0 - 17.0 g/dL Final   HCT 98/98/7974 48.3  39.0 - 52.0 % Final   MCV 10/11/2023 96.0  80.0 -  100.0 fL Final   MCH 10/11/2023 33.4  26.0 - 34.0 pg Final   MCHC 10/11/2023 34.8  30.0 - 36.0 g/dL Final   RDW 98/98/7974 12.3  11.5 - 15.5 % Final   Platelets 10/11/2023 313  150 - 400 K/uL Final   nRBC 10/11/2023 0.0  0.0 - 0.2 % Final   Performed at  [facility], [address] [place], [place] [address]   Troponin I (High Sensitivity) 10/11/2023 2  <18 ng/L Final   Comment: (NOTE) Elevated high sensitivity troponin I (hsTnI) values and significant  changes across serial measurements may suggest ACS but many other  chronic and acute conditions are known to elevate hsTnI results.  Refer to the Links section for chest pain algorithms and additional  guidance. Performed at [facility], [address], [place], [place] [address]     Blood Alcohol level:  Lab Results  Component Value Date   ETH 130 (H) 03/31/2024   ETH 195 (H) 03/11/2024    Metabolic Disorder Labs: Lab Results  Component Value Date   HGBA1C 4.9 07/31/2023   MPG 93.93 07/31/2023   MPG 96.8 04/15/2020   No results found for: PROLACTIN Lab Results  Component Value Date   CHOL 317 (H) 07/31/2023   TRIG 241 (H) 07/31/2023   HDL 110 07/31/2023   CHOLHDL 2.9 07/31/2023   VLDL 48 (H) 07/31/2023   LDLCALC 159 (H) 07/31/2023   LDLCALC NOT CALCULATED 04/15/2020    Therapeutic Lab Levels: No results found for: LITHIUM No results found for: VALPROATE No results found for: CBMZ  Physical Findings   AIMS    Flowsheet Row [facility]  AIMS Total Score 0   AUDIT    Flowsheet Row Admission (Discharged) from 03/12/2024 in [facility] ED from 03/11/2024 in [facility] Admission (Discharged) from 08/01/2023 in [facility] BE[facility] MEDICINE Admission (Discharged) [facility] (Discharged[facility]  Alcohol Use Disorder Identification Test Final Score (AUDIT) 23 25 9 9  0   GAD-7    Flowsheet Row Office Visit from 06/18/2019 in [facility]   Total GAD-7 Score 16   PHQ2-9    Flowsheet Row ED from 04/02/2024 in [facility] Office Visit from 06/18/2019 in [facility] Total Score 4 2  PHQ-9 Total Score 11 10   Flowsheet Row ED from 04/02/2024 in [facility] ED from 03/31/2024 in [facility] Emergency Department at [facility] Admission (Discharged) from 03/12/2024 in [facility]PATIENT BE[facility] MEDICINE  C-SSRS RISK CATEGORY No Risk Error: Q6 is Yes, you must answer 7 High Risk     Musculoskeletal  Strength & Muscle Tone: within normal limits Gait & Station: normal Patient leans: N/A  Psychiatric Specialty Exam  Presentation  General Appearance:  Appropriate for Environment; Casual  Eye Contact: Good  Speech: Clear and Coherent; Normal Rate  Speech Volume: Normal  Handedness: Right   Mood and Affect  Mood: Anxious  Affect: Congruent; Appropriate   Thought Process  Thought Processes: Linear  Descriptions of Associations:Intact  Orientation:Full (Time, Place and Person)  Thought Content:Logical  Diagnosis of Schizophrenia or Schizoaffective disorder in past: No    Hallucinations:Hallucinations: None  Ideas of Reference:None  Suicidal Thoughts:Suicidal Thoughts: No  Homicidal Thoughts:Homicidal Thoughts: No   Sensorium  Memory: Immediate Good  Judgment: Intact  Insight: Present   Executive Functions  Concentration: Good  Attention Span: Good  Recall: Good  Fund of Knowledge: Good  Language: Good   Psychomotor Activity  Psychomotor Activity: Psychomotor Activity: Tremor   Assets  Assets: Communication Skills; Social Support; Desire for Improvement; Housing; Financial Resources/Insurance   Sleep  Sleep: Sleep: Fair  Estimated Sleeping Duration (Last 24 Hours): 0.00 hours  Nutritional Assessment (For OBS and FBC admissions only) Has the patient had a  weight loss or gain of 10 pounds or more in the last 3 months?: No Has the patient had a decrease in food intake/or appetite?: No Does the patient have dental problems?: No Does the patient have eating habits or behaviors that may be indicators of an eating disorder including binging or inducing vomiting?: No Has the patient recently lost weight without trying?: 0 Has the patient been eating poorly because of a decreased appetite?: 0 Malnutrition Screening Tool Score: 0    Physical Exam  Physical Exam  Eyes:     Conjunctiva/sclera: Conjunctivae normal.   Pulmonary:     Effort: Pulmonary effort is normal.   Musculoskeletal:        General: Normal range of motion.   Neurological:     Mental Status: He is alert and oriented to person, place, and time.    Review of Systems  Constitutional:  Negative for chills, diaphoresis and fever.  Respiratory:  Negative for cough.   Gastrointestinal:  Positive for heartburn. Negative for nausea and vomiting.  Musculoskeletal:  Negative for myalgias.  Neurological:  Positive for tremors. Negative for dizziness, weakness and headaches.  Psychiatric/Behavioral:  Positive for depression and substance abuse. Negative for hallucinations and suicidal ideas. The patient is nervous/anxious. The patient does not have insomnia.    Blood pressure 131/82, pulse 85, temperature 98.1 F (36.7 C), temperature source Oral, resp. rate 18, SpO2 99%. There is no height or weight on file to calculate BMI.  Medical Decision Making  DELFIN SQUILLACE is a  33 y.o. male with a past psychiatric history of major depressive disorder, generalized anxiety, ADHD, alcohol use disorder and marijuana use who presented to the [facility] emergency department for accidental overdose of undetermined intent on home promethazine .  On my assessment, patient denies suicidal intent surrounding accidental ingestion/overdose. She reports improved symptoms and compliance with psychotropic  medications.  Patient's presentation recommended that he remain voluntarily for continued observation with alcohol withdrawal.  Last CIWA was 5 and reporting only tremors this morning. Discussed with patient discontinuing Wellbutrin  given limited symptomatic improvement of depression. Also given patient's concurrent alcohol intake, we will discontinue Wellbutrin  given increased seizure risk in the setting of alcohol use. Patient amenable with discontinuing and will restart Zoloft .  Patient previously saw some therapeutic benefit in childhood and also believe it can be helpful in optimizing patient's anxiety symptoms further.   Recommendations  Based on my evaluation the patient does not appear to have an emergency medical condition.  Please refer to [facility] for detailed medications   #Major Depressive Disorder  # Generalized Anxiety Disorder  - Discontinued Wellbutrin  given limited affect and increased seizure risk in setting of alcohol use - Start Zoloft  25 mg daily for depression and anxiety, we will consider adjusting dose based on patient tolerability  #Alcohol Use Disorder  - Continue CIWA triggered Librium , can consider discontinuing  - Social work following, to consider goals of substance treatment, appreciate  assistance   #Marijuana Use  - Encouraged cessation   Chronic Medical Problems:  # Gastritis  - Continue home omeprazole daily  as needed   PATTI OLDEN, MD 04/02/2024 12:00 PM

## 2024-04-02 NOTE — ED Notes (Signed)
 Pt remains calm and cooperative.  Behaviors appropriate.  Continues to deny  alcohol withdrawal symptoms No needs identified at present Hourly rounds continue for safety.

## 2024-04-02 NOTE — Discharge Instructions (Addendum)
 It is imperative that you follow through with treatment recommendations within 5-7 days from the day of discharge to mitigate further risk to your safety and overall mental well-being.         Based on the information that you have provided and the presenting issues outpatient services and resources for have been recommended.  It is imperative that you follow through with treatment recommendations within 5-7 days from the of discharge to mitigate further risk to your safety and mental well-being. A list of referrals has been provided below to get you started.  You are not limited to the list provided.  In case of an urgent crisis, you may contact the Mobile Crisis Unit with [facility] at [phone].   Follow up resources for AA groups are provided below   About A.A. People who think they have a drinking problem are welcome to attend any A.A. meeting. They can sit and listen and learn more about recovery. Or they can share about themselves. It's completely up to them. Everyone is welcome. You don't have to pay anything to attend.  Our members are not professionals on alcoholism or addiction of any kind. We do not provide medical or psychological diagnoses or prognoses. We leave that up to you. A.A. is a supplemental resource. We offer ongoing support for anyone who has a drinking problem based on a solution that works for us .  Meetings are classified as "open" or "closed". Professionals are welcome to observe "open" A.A. meetings. "Open" meetings are for anyone interested in learning more about what happens in A.A. "Closed" meetings are for only those who have a desire to stop drinking. Find an open meeting here.   A.A. Resources   Resources from A.A. in [place]  Contact an A.A. Coordinator for more information: [email or web address] or [email or web address]. We look forward to hearing from you. [place] A.A. Meetings AA support, including regional helplines, is at the  local level. Find local AA information: [email or web address]     AA Meeting Early [facility]  Location:: Address [address] [place], [place], [address]  Weekly Meeting Schedule MONDAY, 8:00 AM - 9:00 AM Early [place] Discussion Open Virtual Join Early [facility] Online AA Meeting Password: [id number]  TUESDAY, 8:00 AM - 9:00 AM Early [place] Discussion Open Virtual Join Early [facility] Online AA Meeting Password: [phone]  [facility], 8:00 AM - 9:00 AM Early [place] Discussion Open Virtual Join Early [facility] Online AA Meeting Password: [id number]  THURSDAY, 8:00 AM - 9:00 AM Early [place] Discussion Open Virtual Join Early [facility] Online AA Meeting Password: [id number]  FRIDAY, 8:00 AM - 9:00 AM Early [place] Discussion Open Virtual Join Early [facility] Online AA Meeting Password: [phone]  SATURDAY, 8:00 AM - 9:00 AM Early [place] Discussion Open Virtual Join Early [facility] Online AA Meeting Password: [phone]  SUNDAY, 8:00 AM - 9:00 AM Early [place] Discussion Open Virtual Join Early [facility] Online AA Meeting Password: [phone]

## 2024-04-02 NOTE — ED Notes (Signed)
 Pt observed lying in bed. Eyes closed respirations even and non labored. NAD Hourly observations continue for safety.

## 2024-04-02 NOTE — Group Note (Signed)
 Group Topic: Relapse and Recovery  Group Date: 04/02/2024 Start Time: 2100 End Time: 2200 Facilitators: Joan Plowman B  Department: [facility]  Number of Participants: 4  Group Focus: abuse issues, activities of daily living skills, coping skills, daily focus, forgiveness, goals/reality orientation, healthy friendships, personal responsibility, and relapse prevention Treatment Modality:  Individual Therapy and Psychoeducation Interventions utilized were exploration, group exercise, patient education, problem solving, story telling, and support Purpose: enhance coping skills, express feelings, increase insight, relapse prevention strategies, and trigger / craving management  Name: Brett Sanford Date of Birth: 1991/05/05  MR: [id number]    Level of Participation: active Quality of Participation: attentive and cooperative Interactions with others: gave feedback Mood/Affect: appropriate Triggers (if applicable): NA Cognition: coherent/clear Progress: Gaining insight Response: NA Plan: patient will be encouraged to keep going to groups.   Patients Problems:  Patient Active Problem List   Diagnosis Date Noted   Alcohol abuse 04/02/2024   Substance abuse (HCC) 04/01/2024   Alcohol use disorder, severe, dependence (HCC) 03/12/2024   MDD (major depressive disorder), recurrent episode, severe (HCC) 08/02/2023   Drug overdose, intentional self-harm, initial encounter (HCC) 10/07/2019   Alcoholic intoxication without complication (HCC)    Suicide attempt (HCC)    Viral illness 08/21/2019   Hypokalemia 06/23/2019   Attention deficit hyperactivity disorder (ADHD) 06/23/2019   Seizure-like activity (HCC) 06/23/2019   History of alcohol abuse 06/23/2019   Chronic migraine 06/23/2019   Diarrhea 06/23/2019

## 2024-04-02 NOTE — ED Notes (Signed)
 Pt was provided dinner.

## 2024-04-02 NOTE — ED Notes (Signed)
 Pt observed OOB in dayroom. Behaviors appropriate.  Pt stated that he was not trying to kill himself when he drank promethazine  last night.  Pt is being monitored via CIWA protocol.  Endorsed slight tremors and anxiety Denied current SI plan and intent Hourly observations continue for safety

## 2024-04-02 NOTE — ED Provider Notes (Signed)
 Facility Based Crisis Admission H&P  Date: 04/02/24 Patient Name: Brett Sanford MRN: [id number] Chief Complaint: substance abuse  Diagnoses:  Final diagnoses:  Substance abuse (HCC)  Recurrent major depressive disorder, remission status unspecified (HCC)    HPI: Brett Sanford, 33 y/o male with a history of alcohol abuse, OD, MDD presented to [facility] as a transfer from the ED,  per the patient he came to the hospital because he mistakenly drank promethazine .  According to the patient he had a very call and he did not realize it important water and it and so when he realized what it was he called 911.  According to the patient he was not trying to commit suicide.  Per the patient when he was last discharged he had made an appointment with [facility] he is scheduled to see them sometimes this week.  Please see consult notes: Brett Sanford is a 33 y.o. male admitted: Presented to the [facility] 03/31/2024  8:01 PM after drinking a cup of Phenergan  which he calls accidental overdose.  He also had been drinking alcohol BAL 130 upon presentation to ED. He carries the psychiatric diagnoses of  MDD and polysubstance abuse and was recently treated at [facility] for the same symptoms of depression and substance abuse.  Brett Sanford    His current presentation of increased anxiety, sadness, tremors, restlessness and helplessness  is most consistent with his hx of depression and substance abuse. Brett Sanford He meets criteria for Inpatient to [facility] for  stabilization,  based on presenting symptoms.  Current outpatient psychotropic medications include Wellbutrin , Propranolol  and historically he has had a therapeutic  response to these medications. He reports he was  compliant with medications prior to admission . On initial examination, patient presents with increased anxiety, tremors, restlessness, helplessness, guilt and remorse related to his relapse on alcohol and other substances.  Please see plan below for detailed recommendations.    Face-to-face observation of patient, patient is alert and oriented x 4, speech is clear, maintain eye contact.  Patient appeared nervous, does have minimal tremors.  Patient currently denies SI, HI, AVH or paranoia.  Does have a history of alcohol abuse, patient has done detox in the past and relapsed most recently.    PHQ9 completed pt score 11 Recommend FBC admission  PHQ 2-9:  Flowsheet Row ED from 04/02/2024 in [facility] Office Visit from 06/18/2019 in [facility] at [facility]  Thoughts that you would be better off dead, or of hurting yourself in some way Several days Several days  [Passive SI without plan/says sometimes doesn't care if he wakes up the next day]  PHQ-9 Total Score 11 10    Flowsheet Row ED from 03/31/2024 in [facility] Emergency Department at [facility] Admission (Discharged) from 03/12/2024 in [facility] INPATIENT BEHAVIORAL MEDICINE ED from 03/11/2024 in [facility]  C-SSRS RISK CATEGORY Error: Q6 is Yes, you must answer 7 High Risk High Risk      Total Time spent with patient: 20 minutes  Musculoskeletal  Strength & Muscle Tone: within normal limits Gait & Station: normal Patient leans: N/A  Psychiatric Specialty Exam  Presentation General Appearance:  Casual  Eye Contact: Fair  Speech: Clear and Coherent  Speech Volume: Normal  Handedness: Right   Mood and Affect  Mood: Anxious  Affect: Congruent   Thought Process  Thought Processes: Coherent  Descriptions of Associations:Intact  Orientation:Full (Time, Place and Person)  Thought Content:WDL  Diagnosis of  Schizophrenia or Schizoaffective disorder in past: No   Hallucinations:Hallucinations: None  Ideas of Reference:None  Suicidal Thoughts:Suicidal Thoughts: No  Homicidal Thoughts:Homicidal Thoughts: No   Sensorium  Memory: Immediate  Fair  Judgment: Fair  Insight: Fair   Art therapist  Concentration: Fair  Attention Span: Fair  Recall: Fair  Fund of Knowledge: Fair  Language: Fair   Psychomotor Activity  Psychomotor Activity: Psychomotor Activity: Normal   Assets  Assets: Desire for Improvement; Resilience   Sleep  Sleep: Sleep: Fair Number of Hours of Sleep: 6   Nutritional Assessment (For OBS and FBC admissions only) Has the patient had a weight loss or gain of 10 pounds or more in the last 3 months?: No Has the patient had a decrease in food intake/or appetite?: No Does the patient have dental problems?: No Does the patient have eating habits or behaviors that may be indicators of an eating disorder including binging or inducing vomiting?: No Has the patient recently lost weight without trying?: 0 Has the patient been eating poorly because of a decreased appetite?: 0 Malnutrition Screening Tool Score: 0    Physical Exam HENT:     Head: Normocephalic.     Nose: Nose normal.   Eyes:     Pupils: Pupils are equal, round, and reactive to light.    Cardiovascular:     Rate and Rhythm: Normal rate.     Pulses: Normal pulses.   Musculoskeletal:        General: Normal range of motion.     Cervical back: Normal range of motion.   Neurological:     General: No focal deficit present.     Mental Status: He is alert.   Psychiatric:        Mood and Affect: Mood normal.        Behavior: Behavior normal.        Thought Content: Thought content normal.    Review of Systems  Constitutional: Negative.   HENT: Negative.    Eyes: Negative.   Respiratory: Negative.    Cardiovascular: Negative.   Gastrointestinal: Negative.   Genitourinary: Negative.   Musculoskeletal: Negative.   Skin: Negative.   Neurological: Negative.   Psychiatric/Behavioral:  Positive for depression and substance abuse. The patient is nervous/anxious.     Blood pressure (!) 137/96, pulse 89,  temperature 98.4 F (36.9 C), temperature source Oral, resp. rate 16, SpO2 98%. There is no height or weight on file to calculate BMI.  Past Psychiatric History: Substance abuse, MDD, overdose  Is the patient at risk to self? No  Has the patient been a risk to self in the past 6 months? Yes .    Has the patient been a risk to self within the distant past? Yes   Is the patient a risk to others? No   Has the patient been a risk to others in the past 6 months? No   Has the patient been a risk to others within the distant past? No   Past Medical History: See chart Family History: Unknown Social History: Alcohol abuse  Last Labs:  Admission on 03/31/2024, Discharged on 04/02/2024  Component Date Value Ref Range Status   Salicylate Lvl 03/31/2024 <7.0 (L)  7.0 - 30.0 mg/dL Final   Performed at [facility], [address] [place], [place] [address]   Acetaminophen  (Tylenol ), Serum 03/31/2024 <10 (L)  10 - 30 ug/mL Final   Comment: (NOTE) Therapeutic concentrations vary significantly. A range of 10-30 ug/mL  may be an effective concentration for many patients. However, some  are best treated at concentrations outside of this range. Acetaminophen  concentrations >150 ug/mL at 4 hours after ingestion  and >50 ug/mL at 12 hours after ingestion are often associated with  toxic reactions.  Performed at [facility], [address], [place], [place] [address]    Alcohol, Ethyl (B) 03/31/2024 130 (H)  <15 mg/dL Final   Comment: (NOTE) For medical purposes only. Performed at [facility], [address] [place], [place] [address]    Opiates 03/31/2024 NONE DETECTED  NONE DETECTED Final   Cocaine 03/31/2024 NONE DETECTED  NONE DETECTED Final   Benzodiazepines 03/31/2024 POSITIVE (A)  NONE DETECTED Final   Amphetamines 03/31/2024 NONE DETECTED  NONE DETECTED Final   Tetrahydrocannabinol 03/31/2024 POSITIVE (A)  NONE DETECTED Final    Barbiturates 03/31/2024 NONE DETECTED  NONE DETECTED Final   Comment: (NOTE) DRUG SCREEN FOR MEDICAL PURPOSES ONLY.  IF CONFIRMATION IS NEEDED FOR ANY PURPOSE, NOTIFY LAB WITHIN 5 DAYS.  LOWEST DETECTABLE LIMITS FOR URINE DRUG SCREEN Drug Class                     Cutoff (ng/mL) Amphetamine and metabolites    1000 Barbiturate and metabolites    200 Benzodiazepine                 200 Opiates and metabolites        300 Cocaine and metabolites        300 THC                            50 Performed at [facility], [address] [place], [place] [address]    WBC 03/31/2024 8.4  4.0 - 10.5 K/uL Final   RBC 03/31/2024 4.96  4.22 - 5.81 MIL/uL Final   Hemoglobin 03/31/2024 16.5  13.0 - 17.0 g/dL Final   HCT 93/77/7974 48.5  39.0 - 52.0 % Final   MCV 03/31/2024 97.8  80.0 - 100.0 fL Final   MCH 03/31/2024 33.3  26.0 - 34.0 pg Final   MCHC 03/31/2024 34.0  30.0 - 36.0 g/dL Final   RDW 93/77/7974 12.8  11.5 - 15.5 % Final   Platelets 03/31/2024 306  150 - 400 K/uL Final   nRBC 03/31/2024 0.0  0.0 - 0.2 % Final   Neutrophils Relative % 03/31/2024 58  % Final   Neutro Abs 03/31/2024 4.9  1.7 - 7.7 K/uL Final   Lymphocytes Relative 03/31/2024 32  % Final   Lymphs Abs 03/31/2024 2.7  0.7 - 4.0 K/uL Final   Monocytes Relative 03/31/2024 7  % Final   Monocytes Absolute 03/31/2024 0.6  0.1 - 1.0 K/uL Final   Eosinophils Relative 03/31/2024 1  % Final   Eosinophils Absolute 03/31/2024 0.1  0.0 - 0.5 K/uL Final   Basophils Relative 03/31/2024 1  % Final   Basophils Absolute 03/31/2024 0.1  0.0 - 0.1 K/uL Final   Immature Granulocytes 03/31/2024 1  % Final   Abs Immature Granulocytes 03/31/2024 0.09 (H)  0.00 - 0.07 K/uL Final   Performed at [facility], [address], [place], [place] [address]   Sodium 03/31/2024 137  135 - 145 mmol/L Final   Potassium 03/31/2024 3.5  3.5 - 5.1 mmol/L Final   Chloride 03/31/2024 103  98 - 111 mmol/L Final   CO2  03/31/2024 20 (  L)  22 - 32 mmol/L Final   Glucose, Bld 03/31/2024 156 (H)  70 - 99 mg/dL Final   Glucose reference range applies only to samples taken after fasting for at least 8 hours.   BUN 03/31/2024 18  6 - 20 mg/dL Final   Creatinine, Ser 03/31/2024 0.92  0.61 - 1.24 mg/dL Final   Calcium  03/31/2024 8.8 (L)  8.9 - 10.3 mg/dL Final   Total Protein 93/77/7974 6.7  6.5 - 8.1 g/dL Final   Albumin 93/77/7974 3.8  3.5 - 5.0 g/dL Final   AST 93/77/7974 30  15 - 41 U/L Final   ALT 03/31/2024 24  0 - 44 U/L Final   Alkaline Phosphatase 03/31/2024 48  38 - 126 U/L Final   Total Bilirubin 03/31/2024 1.0  0.0 - 1.2 mg/dL Final   GFR, Estimated 03/31/2024 >60  >60 mL/min Final   Comment: (NOTE) Calculated using the CKD-EPI Creatinine Equation (2021)    Anion gap 03/31/2024 14  5 - 15 Final   Performed at [facility], [address] [place], [place] [address]  Admission on 03/11/2024, Discharged on 03/12/2024  Component Date Value Ref Range Status   WBC 03/11/2024 7.3  4.0 - 10.5 K/uL Final   RBC 03/11/2024 5.72  4.22 - 5.81 MIL/uL Final   Hemoglobin 03/11/2024 19.0 (H)  13.0 - 17.0 g/dL Final   HCT 93/97/7974 54.7 (H)  39.0 - 52.0 % Final   MCV 03/11/2024 95.6  80.0 - 100.0 fL Final   MCH 03/11/2024 33.2  26.0 - 34.0 pg Final   MCHC 03/11/2024 34.7  30.0 - 36.0 g/dL Final   RDW 93/97/7974 12.3  11.5 - 15.5 % Final   Platelets 03/11/2024 359  150 - 400 K/uL Final   nRBC 03/11/2024 0.0  0.0 - 0.2 % Final   Neutrophils Relative % 03/11/2024 52  % Final   Neutro Abs 03/11/2024 3.8  1.7 - 7.7 K/uL Final   Lymphocytes Relative 03/11/2024 35  % Final   Lymphs Abs 03/11/2024 2.6  0.7 - 4.0 K/uL Final   Monocytes Relative 03/11/2024 10  % Final   Monocytes Absolute 03/11/2024 0.7  0.1 - 1.0 K/uL Final   Eosinophils Relative 03/11/2024 1  % Final   Eosinophils Absolute 03/11/2024 0.1  0.0 - 0.5 K/uL Final   Basophils Relative 03/11/2024 1  % Final   Basophils Absolute  03/11/2024 0.1  0.0 - 0.1 K/uL Final   Immature Granulocytes 03/11/2024 1  % Final   Abs Immature Granulocytes 03/11/2024 0.05  0.00 - 0.07 K/uL Final   Performed at [facility], [address], [place], [place] [address]   Sodium 03/11/2024 137  135 - 145 mmol/L Final   Potassium 03/11/2024 3.8  3.5 - 5.1 mmol/L Final   Chloride 03/11/2024 95 (L)  98 - 111 mmol/L Final   CO2 03/11/2024 25  22 - 32 mmol/L Final   Glucose, Bld 03/11/2024 95  70 - 99 mg/dL Final   Glucose reference range applies only to samples taken after fasting for at least 8 hours.   BUN 03/11/2024 7  6 - 20 mg/dL Final   Creatinine, Ser 03/11/2024 1.10  0.61 - 1.24 mg/dL Final   Calcium  03/11/2024 10.4 (H)  8.9 - 10.3 mg/dL Final   Total Protein 93/97/7974 7.7  6.5 - 8.1 g/dL Final   Albumin 93/97/7974 4.5  3.5 - 5.0 g/dL Final   AST 93/97/7974 130 (H)  15 -  41 U/L Final   ALT 03/11/2024 109 (H)  0 - 44 U/L Final   Alkaline Phosphatase 03/11/2024 59  38 - 126 U/L Final   Total Bilirubin 03/11/2024 1.6 (H)  0.0 - 1.2 mg/dL Final   GFR, Estimated 03/11/2024 >60  >60 mL/min Final   Comment: (NOTE) Calculated using the CKD-EPI Creatinine Equation (2021)    Anion gap 03/11/2024 17 (H)  5 - 15 Final   Performed at [facility], [address] [place], [place] [address]   Alcohol, Ethyl (B) 03/11/2024 195 (H)  <15 mg/dL Final   Comment: (NOTE) For medical purposes only. Performed at [facility], [address], [place], [place] [address]    TSH 03/11/2024 2.348  0.350 - 4.500 uIU/mL Final   Comment: Performed by a 3rd Generation assay with a functional sensitivity of <=0.01 uIU/mL. Performed at [facility], [address], [place], [place] [address]    POC Amphetamine UR 03/11/2024 None Detected  NONE DETECTED (Cut Off Level 1000 ng/mL) Final   POC Secobarbital (BAR) 03/11/2024 None Detected  NONE DETECTED (Cut Off Level 300 ng/mL) Final   POC Buprenorphine (BUP) 03/11/2024 None Detected   NONE DETECTED (Cut Off Level 10 ng/mL) Final   POC Oxazepam (BZO) 03/11/2024 Positive (A)  NONE DETECTED (Cut Off Level 300 ng/mL) Final   POC Cocaine UR 03/11/2024 None Detected  NONE DETECTED (Cut Off Level 300 ng/mL) Final   POC Methamphetamine UR 03/11/2024 None Detected  NONE DETECTED (Cut Off Level 1000 ng/mL) Final   POC Morphine  03/11/2024 None Detected  NONE DETECTED (Cut Off Level 300 ng/mL) Final   POC Methadone UR 03/11/2024 None Detected  NONE DETECTED (Cut Off Level 300 ng/mL) Final   POC Oxycodone UR 03/11/2024 None Detected  NONE DETECTED (Cut Off Level 100 ng/mL) Final   POC Marijuana UR 03/11/2024 Positive (A)  NONE DETECTED (Cut Off Level 50 ng/mL) Final  Admission on 02/25/2024, Discharged on 02/25/2024  Component Date Value Ref Range Status   Influenza A, POC 02/25/2024 Positive (A)  Negative Final   Influenza B, POC 02/25/2024 Negative  Negative Final  Admission on 10/11/2023, Discharged on 10/11/2023  Component Date Value Ref Range Status   Sodium 10/11/2023 137  135 - 145 mmol/L Final   Potassium 10/11/2023 3.4 (L)  3.5 - 5.1 mmol/L Final   Chloride 10/11/2023 104  98 - 111 mmol/L Final   CO2 10/11/2023 20 (L)  22 - 32 mmol/L Final   Glucose, Bld 10/11/2023 100 (H)  70 - 99 mg/dL Final   Glucose reference range applies only to samples taken after fasting for at least 8 hours.   BUN 10/11/2023 8  6 - 20 mg/dL Final   Creatinine, Ser 10/11/2023 0.81  0.61 - 1.24 mg/dL Final   Calcium  10/11/2023 9.1  8.9 - 10.3 mg/dL Final   GFR, Estimated 10/11/2023 >60  >60 mL/min Final   Comment: (NOTE) Calculated using the CKD-EPI Creatinine Equation (2021)    Anion gap 10/11/2023 13  5 - 15 Final   Performed at [facility], [address], [place], [place] [address]   WBC 10/11/2023 6.9  4.0 - 10.5 K/uL Final   RBC 10/11/2023 5.03  4.22 - 5.81 MIL/uL Final   Hemoglobin 10/11/2023 16.8  13.0 - 17.0 g/dL Final   HCT 98/98/7974 48.3  39.0 - 52.0 % Final    MCV 10/11/2023 96.0  80.0 - 100.0 fL Final   MCH  10/11/2023 33.4  26.0 - 34.0 pg Final   MCHC 10/11/2023 34.8  30.0 - 36.0 g/dL Final   RDW 98/98/7974 12.3  11.5 - 15.5 % Final   Platelets 10/11/2023 313  150 - 400 K/uL Final   nRBC 10/11/2023 0.0  0.0 - 0.2 % Final   Performed at [facility], [address], [place], [place] [address]   Troponin I (High Sensitivity) 10/11/2023 2  <18 ng/L Final   Comment: (NOTE) Elevated high sensitivity troponin I (hsTnI) values and significant  changes across serial measurements may suggest ACS but many other  chronic and acute conditions are known to elevate hsTnI results.  Refer to the Links section for chest pain algorithms and additional  guidance. Performed at [facility], [address], [place], [place] [address]     Allergies: Benadryl [diphenhydramine hcl] and Metoclopramide   Medications:  Facility Ordered Medications  Medication   acetaminophen  (TYLENOL ) tablet 650 mg   alum & mag hydroxide-simeth (MAALOX/MYLANTA) 200-200-20 MG/5ML suspension 30 mL   magnesium  hydroxide (MILK OF MAGNESIA) suspension 30 mL   thiamine  (VITAMIN B1) injection 100 mg   multivitamin with minerals tablet 1 tablet   chlordiazePOXIDE  (LIBRIUM ) capsule 25 mg   hydrOXYzine  (ATARAX ) tablet 25 mg   loperamide  (IMODIUM ) capsule 2-4 mg   ondansetron  (ZOFRAN -ODT) disintegrating tablet 4 mg   dicyclomine  (BENTYL ) tablet 20 mg   hydrOXYzine  (ATARAX ) tablet 25 mg   loperamide  (IMODIUM ) capsule 2-4 mg   methocarbamol  (ROBAXIN ) tablet 500 mg   naproxen  (NAPROSYN ) tablet 500 mg   ondansetron  (ZOFRAN -ODT) disintegrating tablet 4 mg   OLANZapine  zydis (ZYPREXA ) disintegrating tablet 5 mg   OLANZapine  (ZYPREXA ) injection 5 mg   OLANZapine  (ZYPREXA ) injection 10 mg   PTA Medications  Medication Sig   propranolol  (INDERAL ) 10 MG tablet Take 1 tablet (10 mg total) by mouth every 6 (six) hours as needed for up to 15 days  (anxiety).   buPROPion  (WELLBUTRIN  XL) 150 MG 24 hr tablet Take 1 tablet (150 mg total) by mouth daily.   omeprazole (PRILOSEC OTC) 20 MG tablet Take 20 mg by mouth daily as needed.   promethazine  (PHENERGAN ) 6.25 MG/5ML solution Take by mouth once.    Long Term Goals: Improvement in symptoms so as ready for discharge  Short Term Goals: Patient will verbalize feelings in meetings with treatment team members., Patient will attend at least of 50% of the groups daily., Pt will complete the PHQ9 on admission, day 3 and discharge., Patient will participate in completing the [place] Suicide Severity Rating Scale, Patient will score a low risk of violence for 24 hours prior to discharge, and Patient will take medications as prescribed daily.  Medical Decision Making  [facility] unit    Recommendations  Based on my evaluation the patient does not appear to have an emergency medical condition.  Gaither Pouch, NP 04/02/24  5:27 AM

## 2024-04-02 NOTE — ED Notes (Signed)
 Pt was provided lunch

## 2024-04-02 NOTE — ED Notes (Signed)
 Patient admitted from [facility] to [facility] for accidental overdosing on Promethazine .Patient A/Ox4 when arrival. MAE. Slightly pressured speech with some tremors observed. Denies being nauseated currently and denies SI/HI/AVH. Skin assessments WNL. Oriented to unit and unit rules. Offered something to eat per request. Made him comfortable in bed. Environment secured per policy. Will to monitor for safety.

## 2024-04-02 NOTE — ED Notes (Signed)
 Report called to [facility], Pt will be going to [facility] and room assignment will happen when he arrives

## 2024-04-02 NOTE — Discharge Planning (Signed)
 LCSW met with patient to assess current mood, affect, physical state, and inquire about needs/goals while here in [facility] and after discharge. Patient reports he presented due to [facility] for alcohol detox. Patient stated that he was drinking 3 shots to a pint of vodka every other day.   Patient reports no other use of any substances otherPatient had a 2-3 year of sobriety and was going to [employer] during this time.  Patient reports he is unemployed and lives with his mother. Patient has had a history of multiple admissions for detox and mental health crises. Patient has outpatient services with [facility]  in [place] and reports he was compliant with medications.   Patient see's a psychiatrist and also had individual therapy done virtually and states he will call to reschedule his follow up with therapist upon his discharge.  Patient has transportation and reports no concerns getting to his appointments in person when scheduled.   Patient reports his current goal is for detox and to return home with ongoing outpatient services with his providers at [facility]. residential placement/sober living/outpatient resources for substance use. Patient currently denies any SI/HI/AVH and reports mood as calm and cooperative. Patient expressed appreciation of LCSW assistance. No other needs were reported at this time by patient.

## 2024-04-02 NOTE — ED Notes (Signed)
 Pt observed in dayroom and interacting appropriately on the milieu. He c/o anxiety. Support and encouragement provided. He denies SI/HI/AVH. PRN Atarax  given as per NP orders. He denies physical symptoms. He is seen drinking fluids well. He is safe on the unit at this time with Q15 minute safety checks in place.

## 2024-04-02 NOTE — Group Note (Signed)
 Group Topic: Communication  Group Date: 04/02/2024 Start Time: 0800 End Time: 0830 Facilitators: Judi Monico RAMAN, NT  Department: [facility]  Number of Participants: 4  Group Focus: check in, community group, coping skills, and daily focus Treatment Modality:  Psychoeducation Interventions utilized were orientation, patient education, and support Purpose: enhance coping skills, express feelings, express irrational fears, increase insight, and reinforce self-care  Name: Brett Sanford Date of Birth: [birth year]  MR: [id number]    Level of Participation: moderate Quality of Participation: attentive Interactions with others: gave feedback Mood/Affect: closed / guarded Triggers (if applicable): n/a Cognition: coherent/clear Progress: Gaining insight Response:  Pt was able to express his goal for the day and expressed understanding of expectations/rules for the unit. Plan: patient will be encouraged to attend future groups.  Patients Problems:  Patient Active Problem List   Diagnosis Date Noted   Alcohol abuse 04/02/2024   Substance abuse (HCC) 04/01/2024   Alcohol use disorder, severe, dependence (HCC) 03/12/2024   MDD (major depressive disorder), recurrent episode, severe (HCC) 08/02/2023   Drug overdose, intentional self-harm, initial encounter (HCC) 10/07/2019   Alcoholic intoxication without complication (HCC)    Suicide attempt (HCC)    Viral illness 08/21/2019   Hypokalemia 06/23/2019   Attention deficit hyperactivity disorder (ADHD) 06/23/2019   Seizure-like activity (HCC) 06/23/2019   History of alcohol abuse 06/23/2019   Chronic migraine 06/23/2019   Diarrhea 06/23/2019

## 2024-04-02 NOTE — Group Note (Signed)
 Group Topic: Wellness  Group Date: 04/02/2024 Start Time: 1100 End Time: 1130 Facilitators: Carletha Iha, RN  Department: [facility]  Number of Participants: 5  Group Focus: other sleep hygiene Treatment Modality:  Psychoeducation Interventions utilized were patient education Purpose: reinforce self-care  Name: Brett Sanford Date of Birth: [birth year]  MR: [id number]    Level of Participation: active Quality of Participation: attentive Interactions with others:  appropritate Mood/Affect: appropriate Triggers (if applicable):  Cognition: logical Progress: Gaining insight Response:  Plan: patient will be encouraged to Practice good sleep hygiene after discharge  Patients Problems:  Quality of Participation:  Interactions with others:  Mood/Affect:  Triggers (if applicable):  Cognition:  Progress:  Response:  Plan:   Patients Problems:  Patient Active Problem List   Diagnosis Date Noted   Alcohol abuse 04/02/2024   Substance abuse (HCC) 04/01/2024   Alcohol use disorder, severe, dependence (HCC) 03/12/2024   MDD (major depressive disorder), recurrent episode, severe (HCC) 08/02/2023   Drug overdose, intentional self-harm, initial encounter (HCC) 10/07/2019   Alcoholic intoxication without complication (HCC)    Suicide attempt (HCC)    Viral illness 08/21/2019   Hypokalemia 06/23/2019   Attention deficit hyperactivity disorder (ADHD) 06/23/2019   Seizure-like activity (HCC) 06/23/2019   History of alcohol abuse 06/23/2019   Chronic migraine 06/23/2019   Diarrhea 06/23/2019

## 2024-04-03 DIAGNOSIS — F339 Major depressive disorder, recurrent, unspecified: Secondary | ICD-10-CM | POA: Diagnosis not present

## 2024-04-03 DIAGNOSIS — F411 Generalized anxiety disorder: Secondary | ICD-10-CM | POA: Diagnosis not present

## 2024-04-03 DIAGNOSIS — T433X1A Poisoning by phenothiazine antipsychotics and neuroleptics, accidental (unintentional), initial encounter: Secondary | ICD-10-CM | POA: Diagnosis not present

## 2024-04-03 DIAGNOSIS — F1020 Alcohol dependence, uncomplicated: Secondary | ICD-10-CM | POA: Diagnosis not present

## 2024-04-03 MED ORDER — SERTRALINE HCL 50 MG PO TABS: 50.0000 mg | ORAL_TABLET | Freq: Every day | ORAL | 0 refills | Status: DC

## 2024-04-03 MED ORDER — SERTRALINE HCL 50 MG PO TABS
50.0000 mg | ORAL_TABLET | Freq: Every day | ORAL | Status: DC
  Administered 2024-04-03: 50 mg via ORAL
  Filled 2024-04-03: qty 1

## 2024-04-03 NOTE — ED Notes (Signed)
 Patient assessed this morning. Denies any pain or discomfort att. Denies SI/HI and AVH. Reports wanting to go home today. Safety measures continued.

## 2024-04-03 NOTE — ED Notes (Signed)
 Patient resting quietly in bed with eyes closed with unlabored breathing. Q 15 minute safety checks remain in place.  Pt remains safe on the unit at this time.

## 2024-04-03 NOTE — ED Notes (Signed)
 Pt discharged, all belongings taken w patient. Items from locker returned to patient. Discharged to home via [employer].

## 2024-04-03 NOTE — ED Provider Notes (Signed)
 [facility]BC/OBS ASAP Discharge Summary  Date and Time: 04/03/2024 10:34 AM  Name: Brett Sanford  MRN:  [id number]   Discharge Diagnoses:  Final diagnoses:  Recurrent major depressive disorder, remission status unspecified (HCC)  Alcohol use disorder, severe, dependence (HCC)  Marijuana use  Generalized anxiety disorder  Accidental overdose, initial encounter   Subjective:   Brett Sanford is a  33 y.o. male with a past psychiatric history of major depressive disorder, generalized anxiety, alcohol use disorder and marijuana use who presented to the [facility] emergency department for accidental overdose of undetermined intent on home promethazine .   On morning assessment, patient reports that his mood is good this morning.  Reports some mild anxiety, which is withdrawal patient and is any other significant withdrawal such as nausea, vomiting, seizures, auditory or visual hallucinations or significant tremors.  Patient reports that he has virtual orientation with his group therapy with [facility] this evening.  Patient also hopes to utilize the substance resources that [facility] has for the outpatient setting.   Patient reports that he is tolerating his Zoloft  after starting yesterday. Discussed the option of increasing, and the patient is amenable.He denies any suicidal ideations or homicidal ideations.  Patient is motivated to continue to take medications after discharge and will follow up with [facility].  Stay Summary:   During the patient's hospitalization, patient had extensive initial psychiatric evaluation, and follow-up psychiatric evaluations every day.  Psychiatric diagnoses provided upon initial assessment:    Recurrent major depressive disorder, remission status unspecified (HCC)  Alcohol use disorder, severe, dependence (HCC)  Marijuana use  Generalized anxiety disorder  Accidental overdose, initial encounter   Patient's psychiatric medications were adjusted on admission:  -  Continued on propranolol  10 mg every 6 hours as needed for anxiety -  Started Hydroxyzine  25 mg q6h as needed for anxiety  - Started  Zoloft  25 mg daily for depression and anxiety - Discontinued Wellbutrin  150 mg daily for depression and ADHD, discontinued in the setting of his increased seizure risk in the setting of alcohol use -Librium  activated by CIWA as needed for alcohol withdrawal - Started Trazodone  50 mg at bedtime as needed for insomnia   During the hospitalization, other adjustments were made to the patient's psychiatric medication regimen:  - Increased Zoloft  50 mg daily for depression and anxiety  Patient's care was discussed during the interdisciplinary team meeting every day during the hospitalization.  The patient denied having side effects to prescribed psychiatric medication.  Gradually, patient started adjusting to milieu. The patient was evaluated each day by a clinical provider to ascertain response to treatment. Improvement was noted by the patient's report of decreasing symptoms, improved sleep and appetite, affect, medication tolerance, behavior, and participation in unit programming.  Patient was asked each day to complete a self inventory noting mood, mental status, pain, new symptoms, anxiety and concerns.    Symptoms were reported as significantly decreased or resolved completely by discharge.   On day of discharge, the patient reports that their mood is stable. The patient denied having suicidal thoughts for more than 48 hours prior to discharge.  Patient denies having homicidal thoughts.  Patient denies having auditory hallucinations.  Patient denies any visual hallucinations or other symptoms of psychosis. The patient was motivated to continue taking medication with a goal of continued improvement in mental health.   The patient reports their target psychiatric symptoms of anxiety and alcohol withdrawal symptoms  responded well to the psychiatric medications, and  the patient reports  overall benefit other psychiatric hospitalization. Supportive psychotherapy was provided to the patient. The patient also participated in regular group therapy while hospitalized. Coping skills, problem solving as well as relaxation therapies were also part of the unit programming.  Labs were reviewed with the patient, and abnormal results were discussed with the patient.  The patient is able to verbalize their individual safety plan to this provider.  # It is recommended to the patient to continue psychiatric medications as prescribed, after discharge from the hospital.    # It is recommended to the patient to follow up with your outpatient psychiatric provider and PCP.  # It was discussed with the patient, the impact of alcohol, drugs, tobacco have been there overall psychiatric and medical wellbeing, and total abstinence from substance use was recommended the patient.ed.  # Prescriptions provided or sent directly to preferred pharmacy at discharge. Patient agreeable to plan. Given opportunity to ask questions. Appears to feel comfortable with discharge.    # In the event of worsening symptoms, the patient is instructed to call the crisis hotline, 911 and or go to the nearest ED for appropriate evaluation and treatment of symptoms. To follow-up with primary care provider for other medical issues, concerns and or health care needs  # Patient was discharged to self care with a plan to follow up as noted below.  Total Time spent with patient: 30 minutes  Past Psychiatric History: MDD, GAD, ADHD.  Patient currently on Wellbutrin  and propranolol  for psychotropic medication.  Patient previously attempted to establish with [facility], however was unable to continue financial reasons.  Patient is establishing with [facility] for outpatient group therapy and plans to have medications managed there also. Past Medication Trials: Venlafaxine ( made me feel weird), fluoxetine , sertraline ,  Vyvanse, Klonopin Past Medical History: per patient report Gastritis Family History: Unknown Family Psychiatric  History: Sister ADHD, brother alcohol, ADD and MDD, maternal aunt with prior suicide attempt Social History: Patient was raised in [place]  and did not have any issues growing up with developmentally.  Patient graduated from high school and currently works at [employer].  Patient never been married.  Patient feels like he has his sister, her friend Bernardino and his mom for support.  Patient denies access to firearms.    Additional Social History:  Patient reports previous treatment and rehab facility at [facility] in 2013.  Patient has been looking into Alcoholics Anonymous groups in the area.  Also interested in the [facility] to optimize sobriety.  Tobacco Cessation:  A prescription for an FDA-approved tobacco cessation medication was offered at discharge and the patient refused  Current Medications:  Current Facility-Administered Medications  Medication Dose Route Frequency Provider Last Rate Last Admin   acetaminophen  (TYLENOL ) tablet 650 mg  650 mg Oral Q6H PRN Trudy Carwin, NP       alum & mag hydroxide-simeth (MAALOX/MYLANTA) 200-200-20 MG/5ML suspension 30 mL  30 mL Oral Q4H PRN Trudy Carwin, NP       chlordiazePOXIDE  (LIBRIUM ) capsule 25 mg  25 mg Oral Q6H PRN Trudy Carwin, NP       dicyclomine  (BENTYL ) tablet 20 mg  20 mg Oral Q6H PRN Trudy Carwin, NP       hydrOXYzine  (ATARAX ) tablet 25 mg  25 mg Oral Q6H PRN Trudy Carwin, NP       hydrOXYzine  (ATARAX ) tablet 25 mg  25 mg Oral Q6H PRN Trudy Carwin, NP   25 mg at 04/02/24 2111   loperamide  (IMODIUM ) capsule 2-4 mg  2-4 mg Oral PRN Trudy Carwin, NP       loperamide  (IMODIUM ) capsule 2-4 mg  2-4 mg Oral PRN Trudy Carwin, NP       magnesium  hydroxide (MILK OF MAGNESIA) suspension 30 mL  30 mL Oral Daily PRN Trudy Carwin, NP       methocarbamol  (ROBAXIN ) tablet 500 mg  500 mg Oral Q8H PRN Trudy Carwin, NP        multivitamin with minerals tablet 1 tablet  1 tablet Oral Daily Trudy Carwin, NP   1 tablet at 04/03/24 9071   naproxen  (NAPROSYN ) tablet 500 mg  500 mg Oral BID PRN Trudy Carwin, NP       OLANZapine  (ZYPREXA ) injection 10 mg  10 mg Intramuscular TID PRN Trudy Carwin, NP       OLANZapine  (ZYPREXA ) injection 5 mg  5 mg Intramuscular TID PRN Trudy Carwin, NP       OLANZapine  zydis (ZYPREXA ) disintegrating tablet 5 mg  5 mg Oral TID PRN Trudy Carwin, NP       ondansetron  (ZOFRAN -ODT) disintegrating tablet 4 mg  4 mg Oral Q6H PRN Trudy Carwin, NP       ondansetron  (ZOFRAN -ODT) disintegrating tablet 4 mg  4 mg Oral Q6H PRN Trudy Carwin, NP       pantoprazole  (PROTONIX ) EC tablet 40 mg  40 mg Oral Daily PRN Trudy Carwin, NP   40 mg at 04/03/24 9071   propranolol  (INDERAL ) tablet 10 mg  10 mg Oral Q6H PRN Lenard Calin, MD   10 mg at 04/03/24 9071   sertraline  (ZOLOFT ) tablet 50 mg  50 mg Oral Daily Lenard Calin, MD   50 mg at 04/03/24 9071   thiamine  (VITAMIN B1) tablet 100 mg  100 mg Oral Daily Lenard Calin, MD   100 mg at 04/03/24 9071   Current Outpatient Medications  Medication Sig Dispense Refill   omeprazole (PRILOSEC OTC) 20 MG tablet Take 20 mg by mouth daily as needed.     propranolol  (INDERAL ) 10 MG tablet Take 1 tablet (10 mg total) by mouth every 6 (six) hours as needed for up to 15 days (anxiety). 60 tablet 0   [START ON 04/04/2024] sertraline  (ZOLOFT ) 50 MG tablet Take 1 tablet (50 mg total) by mouth daily. 30 tablet 0    PTA Medications:  Facility Ordered Medications  Medication   acetaminophen  (TYLENOL ) tablet 650 mg   alum & mag hydroxide-simeth (MAALOX/MYLANTA) 200-200-20 MG/5ML suspension 30 mL   magnesium  hydroxide (MILK OF MAGNESIA) suspension 30 mL   multivitamin with minerals tablet 1 tablet   chlordiazePOXIDE  (LIBRIUM ) capsule 25 mg   hydrOXYzine  (ATARAX ) tablet 25 mg   loperamide  (IMODIUM ) capsule 2-4 mg   ondansetron  (ZOFRAN -ODT) disintegrating  tablet 4 mg   dicyclomine  (BENTYL ) tablet 20 mg   hydrOXYzine  (ATARAX ) tablet 25 mg   loperamide  (IMODIUM ) capsule 2-4 mg   methocarbamol  (ROBAXIN ) tablet 500 mg   naproxen  (NAPROSYN ) tablet 500 mg   ondansetron  (ZOFRAN -ODT) disintegrating tablet 4 mg   OLANZapine  zydis (ZYPREXA ) disintegrating tablet 5 mg   OLANZapine  (ZYPREXA ) injection 5 mg   OLANZapine  (ZYPREXA ) injection 10 mg   thiamine  (VITAMIN B1) tablet 100 mg   propranolol  (INDERAL ) tablet 10 mg   pantoprazole  (PROTONIX ) EC tablet 40 mg   sertraline  (ZOLOFT ) tablet 50 mg   PTA Medications  Medication Sig   propranolol  (INDERAL ) 10 MG tablet Take 1 tablet (10 mg total) by mouth every 6 (six) hours as needed for up to 15 days (  anxiety).   omeprazole (PRILOSEC OTC) 20 MG tablet Take 20 mg by mouth daily as needed.   [START ON 04/04/2024] sertraline  (ZOLOFT ) 50 MG tablet Take 1 tablet (50 mg total) by mouth daily.       04/03/2024    9:58 AM 04/02/2024    4:19 AM 06/18/2019    1:27 PM  Depression screen PHQ 2/9  Decreased Interest 2 2 1   Down, Depressed, Hopeless 2 2 1   PHQ - 2 Score 4 4 2   Altered sleeping 1 1 1   Tired, decreased energy 1 1 1   Change in appetite 1 1 2   Feeling bad or failure about yourself  1 1 1   Trouble concentrating 1 1 0  Moving slowly or fidgety/restless 1 1 2   Suicidal thoughts 1 1 1   PHQ-9 Score 11 11 10   Difficult doing work/chores   Somewhat difficult    Flowsheet Row ED from 04/02/2024 in [facility] ED from 03/31/2024 in [facility] Emergency Department at [facility] Admission (Discharged) from 03/12/2024 in [facility] INPATIENT BEHAVIORAL MEDICINE  C-SSRS RISK CATEGORY No Risk Error: Q6 is Yes, you must answer 7 High Risk    Musculoskeletal  Strength & Muscle Tone: within normal limits Gait & Station: normal Patient leans: N/A  Psychiatric Specialty Exam  Presentation  General Appearance:  Appropriate for Environment; Casual  Eye  Contact: Good  Speech: Clear and Coherent; Normal Rate  Speech Volume: Normal  Handedness: Right   Mood and Affect  Mood: Euthymic  Affect: Congruent; Appropriate   Thought Process  Thought Processes: Coherent; Goal Directed; Linear  Descriptions of Associations:Intact  Orientation:Full (Time, Place and Person)  Thought Content:Logical  Diagnosis of Schizophrenia or Schizoaffective disorder in past: No    Hallucinations:Hallucinations: None  Ideas of Reference:None  Suicidal Thoughts:Suicidal Thoughts: No  Homicidal Thoughts:Homicidal Thoughts: No   Sensorium  Memory: Immediate Good  Judgment: Fair  Insight: Fair   Executive Functions  Concentration: Good  Attention Span: Good  Recall: Good  Fund of Knowledge: Good  Language: Good   Psychomotor Activity  Psychomotor Activity: Psychomotor Activity: Normal   Assets  Assets: Communication Skills; Desire for Improvement; Financial Resources/Insurance; Resilience   Sleep  Sleep: Sleep: Fair  Estimated Sleeping Duration (Last 24 Hours): 7.25-8.00 hours  Nutritional Assessment (For OBS and FBC admissions only) Has the patient had a weight loss or gain of 10 pounds or more in the last 3 months?: No Has the patient had a decrease in food intake/or appetite?: No Does the patient have dental problems?: No Does the patient have eating habits or behaviors that may be indicators of an eating disorder including binging or inducing vomiting?: No Has the patient recently lost weight without trying?: 0 Has the patient been eating poorly because of a decreased appetite?: 0 Malnutrition Screening Tool Score: 0    Physical Exam  Physical Exam Constitutional:      Appearance: Normal appearance. He is not ill-appearing, toxic-appearing or diaphoretic.   Eyes:     Conjunctiva/sclera: Conjunctivae normal.    Musculoskeletal:        General: Normal range of motion.   Neurological:      Mental Status: He is alert and oriented to person, place, and time.    Review of Systems  Constitutional:  Negative for chills and fever.  Respiratory:  Negative for cough.   Cardiovascular:  Negative for chest pain.  Gastrointestinal:  Negative for nausea and vomiting.  Musculoskeletal:  Negative for myalgias.  Neurological:  Negative for tremors, seizures and headaches.  Psychiatric/Behavioral:  Negative for depression, hallucinations, substance abuse and suicidal ideas. The patient is nervous/anxious. The patient does not have insomnia.    Blood pressure 136/84, pulse 84, temperature 98 F (36.7 C), temperature source Oral, resp. rate 18, SpO2 98%. There is no height or weight on file to calculate BMI.  Demographic Factors:  Male and Caucasian  Loss Factors: Loss of significant relationship and concurrent substance dependence   Historical Factors: Prior suicide attempts, Family history of suicide, and Family history of mental illness or substance abuse  Risk Reduction Factors:   Employed, Living with another person, especially a relative, and Positive social support  Continued Clinical Symptoms:  Severe Anxiety and/or Agitation Alcohol/Substance Abuse/Dependencies More than one psychiatric diagnosis Previous Psychiatric Diagnoses and Treatments  Cognitive Features That Contribute To Risk:  None    Suicide Risk:  Minimal: No identifiable suicidal ideation.  Patients presenting with no risk factors but with morbid ruminations; may be classified as minimal risk based on the severity of the depressive symptoms  Plan Of Care/Follow-up recommendations:  Activity: as tolerated  Diet: heart healthy  Other: -Follow-up with your outpatient psychiatric provider -instructions on appointment date, time, and address (location) are provided to you in discharge paperwork.  -Take your psychiatric medications as prescribed at discharge - instructions are provided to you in the  discharge paperwork  -Follow-up with outpatient primary care doctor and other specialists -for management of preventative medicine and chronic medical disease  -Testing: Follow-up with outpatient provider for abnormal lab results:  None to follow-up   -If you are prescribed an atypical antipsychotic medication, we recommend that your outpatient psychiatrist follow routine screening for side effects within 3 months of discharge, including monitoring: AIMS scale, height, weight, blood pressure, fasting lipid panel, HbA1c, and fasting blood sugar.   -Recommend total abstinence from alcohol, tobacco, and other illicit drug use at discharge.   -If your psychiatric symptoms recur, worsen, or if you have side effects to your psychiatric medications, call your outpatient psychiatric provider, 911, [phone] or go to the nearest emergency department.  -If suicidal thoughts occur, immediately call your outpatient psychiatric provider, 911, [phone] or go to the nearest emergency department.  Disposition: Home to self care   Sanford OLDEN, MD 04/03/2024, 10:34 AM

## 2024-04-18 ENCOUNTER — Emergency Department (HOSPITAL_COMMUNITY): Payer: Self-pay

## 2024-04-18 ENCOUNTER — Observation Stay (HOSPITAL_COMMUNITY)
Admission: EM | Admit: 2024-04-18 | Discharge: 2024-04-19 | Disposition: A | Discharge status: Home / Self Care | Payer: Self-pay | Attending: Internal Medicine | Admitting: Internal Medicine

## 2024-04-18 ENCOUNTER — Encounter (HOSPITAL_COMMUNITY): Payer: Self-pay | Admitting: Internal Medicine

## 2024-04-18 DIAGNOSIS — F332 Major depressive disorder, recurrent severe without psychotic features: Secondary | ICD-10-CM | POA: Diagnosis present

## 2024-04-18 DIAGNOSIS — K920 Hematemesis: Principal | ICD-10-CM

## 2024-04-18 DIAGNOSIS — K2100 Gastro-esophageal reflux disease with esophagitis, without bleeding: Secondary | ICD-10-CM | POA: Insufficient documentation

## 2024-04-18 DIAGNOSIS — R7989 Other specified abnormal findings of blood chemistry: Secondary | ICD-10-CM

## 2024-04-18 DIAGNOSIS — K2970 Gastritis, unspecified, without bleeding: Secondary | ICD-10-CM

## 2024-04-18 DIAGNOSIS — R7401 Elevation of levels of liver transaminase levels: Secondary | ICD-10-CM | POA: Insufficient documentation

## 2024-04-18 DIAGNOSIS — Z79899 Other long term (current) drug therapy: Secondary | ICD-10-CM | POA: Insufficient documentation

## 2024-04-18 DIAGNOSIS — K2101 Gastro-esophageal reflux disease with esophagitis, with bleeding: Secondary | ICD-10-CM

## 2024-04-18 DIAGNOSIS — F1010 Alcohol abuse, uncomplicated: Secondary | ICD-10-CM | POA: Diagnosis present

## 2024-04-18 DIAGNOSIS — F419 Anxiety disorder, unspecified: Secondary | ICD-10-CM | POA: Insufficient documentation

## 2024-04-18 DIAGNOSIS — R55 Syncope and collapse: Secondary | ICD-10-CM | POA: Insufficient documentation

## 2024-04-18 DIAGNOSIS — F32A Depression, unspecified: Secondary | ICD-10-CM | POA: Insufficient documentation

## 2024-04-18 DIAGNOSIS — Z87891 Personal history of nicotine dependence: Secondary | ICD-10-CM | POA: Insufficient documentation

## 2024-04-18 LAB — CBC WITH DIFFERENTIAL/PLATELET
Abs Immature Granulocytes: 0.15 K/uL — ABNORMAL HIGH (ref 0.00–0.07)
Basophils Absolute: 0.1 K/uL (ref 0.0–0.1)
Basophils Relative: 2 %
Eosinophils Absolute: 0.2 K/uL (ref 0.0–0.5)
Eosinophils Relative: 3 %
HCT: 46 % (ref 39.0–52.0)
Hemoglobin: 16.1 g/dL (ref 13.0–17.0)
Immature Granulocytes: 2 %
Lymphocytes Relative: 34 %
Lymphs Abs: 2.8 K/uL (ref 0.7–4.0)
MCH: 32.6 pg (ref 26.0–34.0)
MCHC: 35 g/dL (ref 30.0–36.0)
MCV: 93.1 fL (ref 80.0–100.0)
Monocytes Absolute: 0.9 K/uL (ref 0.1–1.0)
Monocytes Relative: 11 %
Neutro Abs: 4.1 K/uL (ref 1.7–7.7)
Neutrophils Relative %: 48 %
Platelets: 228 K/uL (ref 150–400)
RBC: 4.94 MIL/uL (ref 4.22–5.81)
RDW: 13.1 % (ref 11.5–15.5)
WBC: 8.2 K/uL (ref 4.0–10.5)
nRBC: 0 % (ref 0.0–0.2)

## 2024-04-18 LAB — COMPREHENSIVE METABOLIC PANEL WITH GFR
ALT: 33 U/L (ref 0–44)
AST: 56 U/L — ABNORMAL HIGH (ref 15–41)
Albumin: 4.3 g/dL (ref 3.5–5.0)
Alkaline Phosphatase: 52 U/L (ref 38–126)
Anion gap: 17 — ABNORMAL HIGH (ref 5–15)
BUN: 8 mg/dL (ref 6–20)
CO2: 20 mmol/L — ABNORMAL LOW (ref 22–32)
Calcium: 9.5 mg/dL (ref 8.9–10.3)
Chloride: 101 mmol/L (ref 98–111)
Creatinine, Ser: 0.86 mg/dL (ref 0.61–1.24)
GFR, Estimated: >60 mL/min (ref >60)
Glucose, Bld: 108 mg/dL — ABNORMAL HIGH (ref 70–99)
Potassium: 3.6 mmol/L (ref 3.5–5.1)
Sodium: 138 mmol/L (ref 135–145)
Total Bilirubin: 1.2 mg/dL (ref 0.0–1.2)
Total Protein: 7.2 g/dL (ref 6.5–8.1)

## 2024-04-18 LAB — CBC
HCT: 42.3 % (ref 39.0–52.0)
HCT: 40 % (ref 39.0–52.0)
Hemoglobin: 14.6 g/dL (ref 13.0–17.0)
Hemoglobin: 14.2 g/dL (ref 13.0–17.0)
MCH: 32.4 pg (ref 26.0–34.0)
MCH: 32.9 pg (ref 26.0–34.0)
MCHC: 34.5 g/dL (ref 30.0–36.0)
MCHC: 35.5 g/dL (ref 30.0–36.0)
MCV: 94 fL (ref 80.0–100.0)
MCV: 92.8 fL (ref 80.0–100.0)
Platelets: 181 K/uL (ref 150–400)
Platelets: 166 K/uL (ref 150–400)
RBC: 4.5 MIL/uL (ref 4.22–5.81)
RBC: 4.31 MIL/uL (ref 4.22–5.81)
RDW: 13.1 % (ref 11.5–15.5)
RDW: 13.1 % (ref 11.5–15.5)
WBC: 8.2 K/uL (ref 4.0–10.5)
WBC: 6.7 K/uL (ref 4.0–10.5)
nRBC: 0 % (ref 0.0–0.2)
nRBC: 0 % (ref 0.0–0.2)

## 2024-04-18 LAB — HIV ANTIBODY (ROUTINE TESTING W REFLEX): HIV Screen 4th Generation wRfx: NONREACTIVE

## 2024-04-18 LAB — AMMONIA: Ammonia: 26 umol/L (ref 9–35)

## 2024-04-18 LAB — RAPID URINE DRUG SCREEN, HOSP PERFORMED
Amphetamines: NOT DETECTED
Barbiturates: NOT DETECTED
Benzodiazepines: POSITIVE — AB
Cocaine: NOT DETECTED
Opiates: NOT DETECTED
Tetrahydrocannabinol: POSITIVE — AB

## 2024-04-18 LAB — PROTIME-INR
INR: 1 (ref 0.8–1.2)
Prothrombin Time: 13.7 s (ref 11.4–15.2)

## 2024-04-18 LAB — TYPE AND SCREEN
ABO/RH(D): O POS
Antibody Screen: NEGATIVE

## 2024-04-18 LAB — LIPASE, BLOOD: Lipase: 37 U/L (ref 11–51)

## 2024-04-18 LAB — ABO/RH: ABO/RH(D): O POS

## 2024-04-18 LAB — MRSA NEXT GEN BY PCR, NASAL: MRSA by PCR Next Gen: NOT DETECTED

## 2024-04-18 LAB — ETHANOL: Alcohol, Ethyl (B): 309 mg/dL (ref <15)

## 2024-04-18 LAB — APTT: aPTT: 28 s (ref 24–36)

## 2024-04-18 MED ORDER — ONDANSETRON HCL 4 MG/2ML IJ SOLN
4.0000 mg | Freq: Once | INTRAMUSCULAR | Status: AC
  Administered 2024-04-18: 4 mg via INTRAVENOUS
  Filled 2024-04-18: qty 2

## 2024-04-18 MED ORDER — PANTOPRAZOLE SODIUM 40 MG IV SOLR
40.0000 mg | INTRAVENOUS | Status: AC
  Administered 2024-04-18 (×2): 40 mg via INTRAVENOUS
  Filled 2024-04-18: qty 10

## 2024-04-18 MED ORDER — LACTATED RINGERS IV SOLN: INTRAVENOUS | Status: AC

## 2024-04-18 MED ORDER — CHLORDIAZEPOXIDE HCL 25 MG PO CAPS: 25.0000 mg | ORAL_CAPSULE | Freq: Every day | ORAL | Status: DC

## 2024-04-18 MED ORDER — CHLORDIAZEPOXIDE HCL 25 MG PO CAPS
25.0000 mg | ORAL_CAPSULE | Freq: Four times a day (QID) | ORAL | Status: AC
Start: 2024-04-18 — End: 2024-04-19
  Administered 2024-04-18 – 2024-04-19 (×3): 25 mg via ORAL
  Filled 2024-04-18 (×3): qty 1

## 2024-04-18 MED ORDER — FOLIC ACID 1 MG PO TABS
1.0000 mg | ORAL_TABLET | Freq: Every day | ORAL | Status: DC
  Administered 2024-04-18 – 2024-04-19 (×2): 1 mg via ORAL
  Filled 2024-04-18 (×2): qty 1

## 2024-04-18 MED ORDER — PROCHLORPERAZINE EDISYLATE 10 MG/2ML IJ SOLN
5.0000 mg | INTRAMUSCULAR | Status: DC | PRN
  Administered 2024-04-18: 5 mg via INTRAVENOUS
  Filled 2024-04-18: qty 2

## 2024-04-18 MED ORDER — LORAZEPAM 2 MG/ML IJ SOLN: 0.0000 mg | Freq: Three times a day (TID) | INTRAMUSCULAR | Status: DC

## 2024-04-18 MED ORDER — THIAMINE HCL 100 MG/ML IJ SOLN
100.0000 mg | Freq: Every day | INTRAMUSCULAR | Status: DC
Start: 2024-04-18 — End: 2024-04-19

## 2024-04-18 MED ORDER — CHLORDIAZEPOXIDE HCL 25 MG PO CAPS
25.0000 mg | ORAL_CAPSULE | ORAL | Status: DC
  Filled 2024-04-18: qty 1

## 2024-04-18 MED ORDER — LORAZEPAM 1 MG PO TABS
1.0000 mg | ORAL_TABLET | ORAL | Status: DC | PRN
  Administered 2024-04-18 – 2024-04-19 (×5): 1 mg via ORAL
  Filled 2024-04-18 (×5): qty 1

## 2024-04-18 MED ORDER — THIAMINE HCL 100 MG/ML IJ SOLN
500.0000 mg | Freq: Once | INTRAVENOUS | Status: AC
  Administered 2024-04-18: 500 mg via INTRAVENOUS
  Filled 2024-04-18: qty 5

## 2024-04-18 MED ORDER — PANTOPRAZOLE SODIUM 40 MG IV SOLR
40.0000 mg | Freq: Two times a day (BID) | INTRAVENOUS | Status: DC
  Administered 2024-04-18 (×2): 40 mg via INTRAVENOUS
  Filled 2024-04-18 (×3): qty 10

## 2024-04-18 MED ORDER — LORAZEPAM 2 MG/ML IJ SOLN
0.0000 mg | INTRAMUSCULAR | Status: DC
  Administered 2024-04-18: 1 mg via INTRAVENOUS
  Filled 2024-04-18: qty 1

## 2024-04-18 MED ORDER — ADULT MULTIVITAMIN W/MINERALS CH
1.0000 | ORAL_TABLET | Freq: Every day | ORAL | Status: DC
  Administered 2024-04-18 – 2024-04-19 (×2): 1 via ORAL
  Filled 2024-04-18 (×2): qty 1

## 2024-04-18 MED ORDER — THIAMINE MONONITRATE 100 MG PO TABS
100.0000 mg | ORAL_TABLET | Freq: Every day | ORAL | Status: DC
  Administered 2024-04-18 – 2024-04-19 (×2): 100 mg via ORAL
  Filled 2024-04-18 (×2): qty 1

## 2024-04-18 MED ORDER — CHLORDIAZEPOXIDE HCL 25 MG PO CAPS
25.0000 mg | ORAL_CAPSULE | Freq: Three times a day (TID) | ORAL | Status: DC
  Administered 2024-04-19 (×2): 25 mg via ORAL
  Filled 2024-04-18: qty 1

## 2024-04-18 MED ORDER — SODIUM CHLORIDE 0.9 % IV SOLN: INTRAVENOUS | Status: DC

## 2024-04-18 MED ORDER — LORAZEPAM 2 MG/ML IJ SOLN
1.0000 mg | INTRAMUSCULAR | Status: DC | PRN
  Administered 2024-04-18: 1 mg via INTRAVENOUS
  Filled 2024-04-18: qty 1

## 2024-04-18 MED ORDER — SODIUM CHLORIDE 0.9 % IV BOLUS
1000.0000 mL | Freq: Once | INTRAVENOUS | Status: AC
  Administered 2024-04-18: 1000 mL via INTRAVENOUS

## 2024-04-18 NOTE — Discharge Instructions (Signed)

## 2024-04-18 NOTE — Progress Notes (Signed)
 CSW added substance abuse resources to patient's AVS.  Edwin Dada, MSW, LCSW Transitions of Care  Clinical Social Worker II [phone]

## 2024-04-18 NOTE — H&P (View-Only) (Signed)
 Consultation Note   Referring Provider:  [facility] PCP: Pcp, No Primary Gastroenterologist: Sampson       Reason for Consultation:  Hematemesis DOA: 04/18/2024         Hospital Day: 1   ASSESSMENT    33 yo male with a history of alcohol abuse, polysubstance abuse , depression, suicidal ideation ( recent admission for). Readmitted for hematemesis  Hematemesis- several episodes of red blood and coffee ground emesis.  PUD, erosive disease, Mallory-Weiss tear are all on the list of differentials Hgb WNL at 14.6 but down from baseline ~ 16.5 BUN normal.   Recent elevations in liver enzymes , improving  In October 2024 AST was 468, ALT 176 .  In early June liver enzymes improved to AST 130, ALT 109 .  Currently with a minimal elevation in AST to 56, LFTs otherwise normal.  Recent elevations may have been combination of EtOH and/or medications.  INR 1.0.   Chronic GERD Symptoms controlled with daily Prilosec   See PMH for additional history  Principal Problem:   Hematemesis Active Problems:   MDD (major depressive disorder), recurrent episode, severe (HCC)   Alcohol abuse   Syncope     PLAN:   --Continue IV fluids, antiemetics.  He has ongoing nausea so keep n.p.o. for now --Continue twice daily IV PPI --Will need EGD this admission, possibly tomorrow. The risks and benefits of EGD with possible biopsies were discussed with the patient who agrees to proceed.  --LFTs improving and nearly normalized now but given prior history of elevation so will check for chronic hep B and hep C -- Discussed need to severely reduce or stop EtOH altogether.  Discussed potential for chronic liver disease amongst other potential complications of alcohol abuse   HPI   Brett Sanford was recently discharged from the hospital after an admission for alcoholism and suicidal ideations.  He was brought to the ED early this a.m. after having multiple  episodes of hematemesis.  Initially episode of emesis did contain both bright red blood and dark gritty material.  He had multiple episodes during the night .  He has some mild upper abdominal discomfort but this started after the vomiting.  He has not had any red blood in stools nor dark stools.  He does not take NSAIDs.  He does take omeprazole  every day which control reflux  In the ED he has been mildly tachycardic, normotensive.  WBC normal.  Hemoglobin 14.6, down from baseline of 16.5.  BUN normal.  AST 56, remainder of LFTs normal, renal function normal , lipase normal , INR 1.0  Labs and Imaging:  Recent Labs    04/18/24 0018  PROT 7.2  ALBUMIN 4.3  AST 56*  ALT 33  ALKPHOS 52  BILITOT 1.2   Recent Labs    04/18/24 0018 04/18/24 0535  WBC 8.2 8.2  HGB 16.1 14.6  HCT 46.0 42.3  MCV 93.1 94.0  PLT 228 181   Recent Labs    04/18/24 0018  NA 138  K 3.6  CL 101  CO2 20*  GLUCOSE 108*  BUN 8  CREATININE 0.86  CALCIUM  9.5     DG Chest Port 1 View CLINICAL DATA:  Hematemesis  EXAM: PORTABLE  CHEST 1 VIEW  COMPARISON:  10/11/2023  FINDINGS: The heart size and mediastinal contours are within normal limits. Both lungs are clear. The visualized skeletal structures are unremarkable.  IMPRESSION: No active disease.  Electronically Signed   By: Franky Crease M.D.   On: 04/18/2024 00:30    Past Medical History:  Diagnosis Date   ADD (attention deficit disorder)    Drug abuse (HCC)    Medical history non-contributory     Past Surgical History:  Procedure Laterality Date   NO PAST SURGERIES      Family History  Family history unknown: Yes    Prior to Admission medications   Medication Sig Start Date End Date Taking? Authorizing Provider  acetaminophen  (TYLENOL ) 500 MG tablet Take 1,000 mg by mouth 2 (two) times daily as needed for moderate pain (pain score 4-6), fever or headache.   Yes [provider]  omeprazole  (PRILOSEC  OTC) 20 MG  tablet Take 20 mg by mouth daily.   Yes [provider]  ondansetron  (ZOFRAN -ODT) 4 MG disintegrating tablet Take 4 mg by mouth every 8 (eight) hours as needed for nausea or vomiting.   Yes [provider]  propranolol  (INDERAL ) 10 MG tablet Take 1 tablet (10 mg total) by mouth every 6 (six) hours as needed for up to 15 days (anxiety). 03/19/24 06/15/24 Yes Cleotilde Hoy HERO, NP  sertraline  (ZOLOFT ) 50 MG tablet Take 1 tablet (50 mg total) by mouth daily. Patient not taking: Reported on 04/18/2024 04/04/24   Lenard Calin, MD    Current Facility-Administered Medications  Medication Dose Route Frequency Provider Last Rate Last Admin   folic acid  (FOLVITE ) tablet 1 mg  1 mg Oral Daily Franky Redia SAILOR, MD       lactated ringers  infusion   Intravenous Continuous Franky Redia SAILOR, MD 100 mL/hr at 04/18/24 0432 New Bag at 04/18/24 0432   LORazepam  (ATIVAN ) injection 0-4 mg  0-4 mg Intravenous Q4H Kakrakandy, Arshad N, MD       Followed by   NOREEN ON 04/20/2024] LORazepam  (ATIVAN ) injection 0-4 mg  0-4 mg Intravenous Q8H Franky Redia SAILOR, MD       LORazepam  (ATIVAN ) tablet 1-4 mg  1-4 mg Oral Q1H PRN Kakrakandy, Arshad N, MD   1 mg at 04/18/24 9250   Or   LORazepam  (ATIVAN ) injection 1-4 mg  1-4 mg Intravenous Q1H PRN Kakrakandy, Arshad N, MD   1 mg at 04/18/24 0431   multivitamin with minerals tablet 1 tablet  1 tablet Oral Daily Franky Redia SAILOR, MD       pantoprazole  (PROTONIX ) injection 40 mg  40 mg Intravenous Q12H Franky Redia SAILOR, MD       prochlorperazine  (COMPAZINE ) injection 5 mg  5 mg Intravenous Q4H PRN Kakrakandy, Arshad N, MD   5 mg at 04/18/24 0518   thiamine  (VITAMIN B1) tablet 100 mg  100 mg Oral Daily Franky Redia SAILOR, MD       Or   thiamine  (VITAMIN B1) injection 100 mg  100 mg Intravenous Daily Franky Redia SAILOR, MD       Current Outpatient Medications  Medication Sig Dispense Refill   acetaminophen  (TYLENOL ) 500 MG tablet Take 1,000  mg by mouth 2 (two) times daily as needed for moderate pain (pain score 4-6), fever or headache.     omeprazole  (PRILOSEC  OTC) 20 MG tablet Take 20 mg by mouth daily.     ondansetron  (ZOFRAN -ODT) 4 MG disintegrating tablet Take 4 mg by mouth every  8 (eight) hours as needed for nausea or vomiting.     propranolol  (INDERAL ) 10 MG tablet Take 1 tablet (10 mg total) by mouth every 6 (six) hours as needed for up to 15 days (anxiety). 60 tablet 0   sertraline  (ZOLOFT ) 50 MG tablet Take 1 tablet (50 mg total) by mouth daily. (Patient not taking: Reported on 04/18/2024) 30 tablet 0    Allergies as of 04/18/2024 - Review Complete 04/18/2024  Allergen Reaction Noted   Benadryl [diphenhydramine hcl] Other (See Comments) 07/28/2012   Reglan  [metoclopramide ] Other (See Comments) 05/11/2023    Social History   Socioeconomic History   Marital status: Single    Spouse name: Not on file   Number of children: Not on file   Years of education: Not on file   Highest education level: Not on file  Occupational History   Not on file  Tobacco Use   Smoking status: Former    Current packs/day: 1.50    Average packs/day: 1.5 packs/day for 5.0 years (7.5 ttl pk-yrs)    Types: Cigarettes   Smokeless tobacco: Never   Tobacco comments:    1-2 packs per day/5 years  Vaping Use   Vaping status: Never Used  Substance and Sexual Activity   Alcohol use: Yes    Alcohol/week: 8.0 standard drinks of alcohol    Types: 8 Shots of liquor per week    Comment: a few shots/5-6 days/week   Drug use: Yes    Types: Marijuana    Comment: once a week   Sexual activity: Yes  Other Topics Concern   Not on file  Social History Narrative   Pt employed and living with mother who he identifies as his main social support. Pt has not taken his meds for the last 6 months. Pt has trouble getting medications because he does not have insurance.   Social Drivers of Corporate investment banker Strain: Low Risk  (09/29/2022)    Received from [facility]   Overall Financial Resource Strain (CARDIA)    Difficulty of Paying Living Expenses: Not very hard  Food Insecurity: No Food Insecurity (04/02/2024)   Hunger Vital Sign    Worried About Running Out of Food in the Last Year: Never true    Ran Out of Food in the Last Year: Never true  Transportation Needs: No Transportation Needs (04/02/2024)   PRAPARE - Administrator, Civil Service (Medical): No    Lack of Transportation (Non-Medical): No  Physical Activity: Sufficiently Active (09/29/2022)   Received from [facility]   Exercise Vital Sign    On average, how many days per week do you engage in moderate to strenuous exercise (like a brisk walk)?: 3 days    On average, how many minutes do you engage in exercise at this level?: 120 min  Stress: Stress Concern Present (09/29/2022)   Received from [facility] - Occupational Stress Questionnaire    Feeling of Stress : To some extent  Social Connections: Socially Isolated (09/29/2022)   Received from [facility]   Social Connection and Isolation Panel    In a typical week, how many times do you talk on the phone with family, friends, or neighbors?: More than three times a week    How often do you get together with friends or relatives?: More than three times a week    How often do you attend church or religious  services?: Never    Do you belong to any clubs or organizations such as church groups, unions, fraternal or athletic groups, or school groups?: No    How often do you attend meetings of the clubs or organizations you belong to?: Never    Are you married, widowed, divorced, separated, never married, or living with a partner?: Never married  Intimate Partner Violence: Not At Risk (04/02/2024)   Humiliation, Afraid, Rape, and Kick questionnaire    Fear of Current or Ex-Partner: No    Emotionally Abused: No    Physically Abused: No    Sexually  Abused: No     Code Status   Code Status: Full Code  Review of Systems: All systems reviewed and negative except where noted in HPI.  Physical Exam: Vital signs in last 24 hours: Temp:  [97.4 F (36.3 C)-97.9 F (36.6 C)] 97.8 F (36.6 C) (07/10 0830) Pulse Rate:  [97-117] 98 (07/10 0830) Resp:  [18-21] 18 (07/10 0830) BP: (109-144)/(72-88) 144/84 (07/10 0830) SpO2:  [94 %-97 %] 97 % (07/10 0830) Weight:  [99.8 kg] 99.8 kg (07/10 0830)    General:  Pleasant male in NAD Psych:  Cooperative. Normal mood and affect Eyes: Pupils equal Ears:  Normal auditory acuity Nose: No deformity, discharge or lesions Neck:  Supple, no masses felt Lungs:  Clear to auscultation.  Heart:  Regular rate, regular rhythm.  Abdomen:  Soft, nondistended, nontender, active bowel sounds, no masses felt Rectal :  Deferred Msk: Symmetrical without gross deformities.  Neurologic:  Alert, oriented, grossly normal neurologically Extremities : No edema Skin:  Intact without significant lesions.    Intake/Output from previous day: 07/09 0701 - 07/10 0700 In: -  Out: 600 [Emesis/NG output:600] Intake/Output this shift:  No intake/output data recorded.   Vina Dasen, NP-C   04/18/2024, 9:29 AM   I have taken an interval history, thoroughly reviewed the chart and examined the patient. I agree with the Advanced Practitioner's note, impression and recommendations, and have recorded additional findings, impressions and recommendations below. I performed a substantive portion of this encounter (>50% time spent), including a complete performance of the medical decision making.  My additional thoughts are as follows:  Hematemesis with coffee-ground's emesis and bright red blood, hemoglobin remains normal although down from baseline.  BUN normal, hemodynamically stable and exam benign. Mild LFT abnormality from alcohol use, lower than it was last year.  He is somewhat tremulous and thankfully on a  CIWA protocol.  Upper endoscopy tomorrow, and he was agreeable after discussion of procedure and risks and his mother was present during my visit.  The benefits and risks of the planned procedure(s) were described in detail with the patient or (when appropriate) their health care proxy.  Risks were outlined as including, but not limited to, bleeding, infection, perforation, adverse medication reaction leading to cardiac or pulmonary decompensation, pancreatitis (if ERCP).  The limitation of incomplete mucosal visualization was also discussed.  No guarantees or warranties were given.    Brett Sanford [phone]

## 2024-04-18 NOTE — Progress Notes (Signed)
 [facility] PLAN OF CARE NOTE Patient: Brett Sanford FMW:[id number]   PCP: Pcp, No DOB: 1991/06/10   DOA: 04/18/2024   DOS: 04/18/2024    Patient was admitted by my colleague earlier on 04/18/2024. I have reviewed the H&P as well as assessment and plan and agree with the same. Important changes in the plan are listed below.  Plan of care: Principal Problem:   Hematemesis Active Problems:   MDD (major depressive disorder), recurrent episode, severe (HCC)   Alcohol abuse   Syncope Appreciate GI consultation. Continue PPI. Clear liquid diet today.  N.p.o. after midnight tomorrow.  Appears to be already having withdrawal although his alcohol level was 300 this morning. Will add Librium  on scheduled basis instead of Ativan  and monitor.  Level of care: Telemetry Medical Switch from progressive to tele  Author: Yetta Blanch, MD  [facility] 04/18/2024 5:03 PM   If 7PM-7AM, please contact night-coverage at [email or web address]

## 2024-04-18 NOTE — ED Notes (Signed)
 CCMD contacted for cardiac monitoring

## 2024-04-18 NOTE — ED Provider Notes (Signed)
 [place] EMERGENCY DEPARTMENT AT [facility] Provider Note   CSN: [id number] Arrival date & time: 04/18/24  0004     Patient presents with: No chief complaint on file.   Brett Sanford is a 33 y.o. male.   The history is provided by the patient and medical records.   33 year old male with history of alcohol abuse, chronic migraines, ADD, polysubstance abuse, presenting to the ED with hematemesis.  Currently this just began about an hour ago.  Mother was bringing him in for evaluation and had several syncopal episodes in the car.  He denies any prior history of hematemesis before.  He denies any blood in the stool.  Still feeling somewhat nauseated.  He denies any significant abdominal pain.  No prior history of stomach ulcers.  Has never had prior endoscopy.  Prior to Admission medications   Medication Sig Start Date End Date Taking? Authorizing Provider  omeprazole  (PRILOSEC  OTC) 20 MG tablet Take 20 mg by mouth daily as needed.    [provider]  propranolol  (INDERAL ) 10 MG tablet Take 1 tablet (10 mg total) by mouth every 6 (six) hours as needed for up to 15 days (anxiety). 03/19/24 04/03/24  Cleotilde Hoy HERO, NP  sertraline  (ZOLOFT ) 50 MG tablet Take 1 tablet (50 mg total) by mouth daily. 04/04/24   Lenard Calin, MD    Allergies: Benadryl [diphenhydramine hcl] and Metoclopramide     Review of Systems  Gastrointestinal:  Positive for nausea and vomiting.  All other systems reviewed and are negative.   Updated Vital Signs There were no vitals taken for this visit.  Physical Exam Vitals and nursing note reviewed.  Constitutional:      Appearance: He is well-developed.     Comments: Pale in appearance  HENT:     Head: Normocephalic and atraumatic.     Mouth/Throat:     Comments: Blood present on lips/mouth, no active vomiting during exam Eyes:     Conjunctiva/sclera: Conjunctivae normal.     Pupils: Pupils are equal, round, and reactive to light.   Cardiovascular:     Rate and Rhythm: Normal rate and regular rhythm.     Heart sounds: Normal heart sounds.  Pulmonary:     Effort: Pulmonary effort is normal. No respiratory distress.     Breath sounds: Normal breath sounds. No rhonchi.  Abdominal:     General: Bowel sounds are normal.     Palpations: Abdomen is soft.     Comments: Soft, non-tender  Musculoskeletal:        General: Normal range of motion.     Cervical back: Normal range of motion.  Skin:    General: Skin is warm and dry.  Neurological:     Mental Status: He is alert and oriented to person, place, and time.     Comments: AAOx3, able to answer questions and follow commands     (all labs ordered are listed, but only abnormal results are displayed) Labs Reviewed  CBC WITH DIFFERENTIAL/PLATELET - Abnormal; Notable for the following components:      Result Value   Abs Immature Granulocytes 0.15 (*)    All other components within normal limits  COMPREHENSIVE METABOLIC PANEL WITH GFR - Abnormal; Notable for the following components:   CO2 20 (*)    Glucose, Bld 108 (*)    AST 56 (*)    Anion gap 17 (*)    All other components within normal limits  ETHANOL - Abnormal; Notable for the  following components:   Alcohol, Ethyl (B) 309 (*)    All other components within normal limits  RAPID URINE DRUG SCREEN, HOSP PERFORMED - Abnormal; Notable for the following components:   Benzodiazepines POSITIVE (*)    Tetrahydrocannabinol POSITIVE (*)    All other components within normal limits  PROTIME-INR  LIPASE, BLOOD  AMMONIA  APTT  TYPE AND SCREEN    EKG: None  Radiology: No results found.   Procedures   Medications Ordered in the ED  sodium chloride  0.9 % bolus 1,000 mL (has no administration in time range)  pantoprazole  (PROTONIX ) injection 40 mg (has no administration in time range)    Followed by  pantoprazole  (PROTONIX ) injection 40 mg (has no administration in time range)  ondansetron  (ZOFRAN )  injection 4 mg (has no administration in time range)                                    Medical Decision Making Amount and/or Complexity of Data Reviewed Labs: ordered. Radiology: ordered and independent interpretation performed. ECG/medicine tests: ordered and independent interpretation performed.  Risk Prescription drug management. Decision regarding hospitalization.   33 year old male brought in by mom for hematemesis.  Apparently found down in the bathroom with blood over the toilet.  Had subsequent syncopal event and route to the hospital.  He is awake, alert, oriented on my exam.  He is hemodynamically stable, slightly tachycardic.  Does have some blood on the mouth but is not actively vomiting.  Does admit to ongoing alcohol use but is trying to cut back.  Abdomen soft, non-tender, no peritoneal signs.  Consider alcoholic gastritis, peptic ulcer, etc.  No prior EGD or known history of varices.  Initiate IV fluids, IV Protonix , labs sent.  Will obtain CXR as well.  2:33 AM Patient reassessed-- still nauseated but no further emesis.  He is tachycardic still around 110.  Admits he needs assistance with alcohol cessation.  He is gone through detox a few times before, no history of DTs.  Labs today with stable hemoglobin at 16.  AST mildly elevated at 56.  Normal INR.  Ethanol 309.  UDS is positive for benzodiazepines and THC.  CXR is clear.    Feel he would benefit from continued IV Protonix , possible eval with GI in the morning for endoscopy.  May need to assess for esophageal varices due to his long-term heavy alcohol use.  He is agreeable.  Will initiate CIWA protocol.  Spoke with hospitalist, Dr. Franky-- will admit for ongoing care.  Secure message sent to on call GI for AM consultation.  Final diagnoses:  Hematemesis with nausea  Alcohol abuse    ED Discharge Orders     None          Jarold Olam HERO, PA-C 04/18/24 0406    Darra Fonda MATSU, MD 04/18/24  [phone]

## 2024-04-18 NOTE — ED Triage Notes (Signed)
 Pt brought in POV with mother. Pt mother states pt was found on the floor of bathroom with blood in toilet from vomiting. Pt was diaphoretic per mother. Pt had multiple episodes of syncope prior to arrival. Denies any drugs.admits to alcohol intake today

## 2024-04-18 NOTE — ED Notes (Signed)
 Pt still endorsing nausea and vomiting episodes

## 2024-04-18 NOTE — Consult Note (Addendum)
 Consultation Note   Referring Provider:  [facility] PCP: Pcp, No Primary Gastroenterologist: Sampson       Reason for Consultation:  Hematemesis DOA: 04/18/2024         Hospital Day: 1   ASSESSMENT    33 yo male with a history of alcohol abuse, polysubstance abuse , depression, suicidal ideation ( recent admission for). Readmitted for hematemesis  Hematemesis- several episodes of red blood and coffee ground emesis.  PUD, erosive disease, Mallory-Weiss tear are all on the list of differentials Hgb WNL at 14.6 but down from baseline ~ 16.5 BUN normal.   Recent elevations in liver enzymes , improving  In October 2024 AST was 468, ALT 176 .  In early June liver enzymes improved to AST 130, ALT 109 .  Currently with a minimal elevation in AST to 56, LFTs otherwise normal.  Recent elevations may have been combination of EtOH and/or medications.  INR 1.0.   Chronic GERD Symptoms controlled with daily Prilosec   See PMH for additional history  Principal Problem:   Hematemesis Active Problems:   MDD (major depressive disorder), recurrent episode, severe (HCC)   Alcohol abuse   Syncope     PLAN:   --Continue IV fluids, antiemetics.  He has ongoing nausea so keep n.p.o. for now --Continue twice daily IV PPI --Will need EGD this admission, possibly tomorrow. The risks and benefits of EGD with possible biopsies were discussed with the patient who agrees to proceed.  --LFTs improving and nearly normalized now but given prior history of elevation so will check for chronic hep B and hep C -- Discussed need to severely reduce or stop EtOH altogether.  Discussed potential for chronic liver disease amongst other potential complications of alcohol abuse   HPI   Leovardo was recently discharged from the hospital after an admission for alcoholism and suicidal ideations.  He was brought to the ED early this a.m. after having multiple  episodes of hematemesis.  Initially episode of emesis did contain both bright red blood and dark gritty material.  He had multiple episodes during the night .  He has some mild upper abdominal discomfort but this started after the vomiting.  He has not had any red blood in stools nor dark stools.  He does not take NSAIDs.  He does take omeprazole  every day which control reflux  In the ED he has been mildly tachycardic, normotensive.  WBC normal.  Hemoglobin 14.6, down from baseline of 16.5.  BUN normal.  AST 56, remainder of LFTs normal, renal function normal , lipase normal , INR 1.0  Labs and Imaging:  Recent Labs    04/18/24 0018  PROT 7.2  ALBUMIN 4.3  AST 56*  ALT 33  ALKPHOS 52  BILITOT 1.2   Recent Labs    04/18/24 0018 04/18/24 0535  WBC 8.2 8.2  HGB 16.1 14.6  HCT 46.0 42.3  MCV 93.1 94.0  PLT 228 181   Recent Labs    04/18/24 0018  NA 138  K 3.6  CL 101  CO2 20*  GLUCOSE 108*  BUN 8  CREATININE 0.86  CALCIUM  9.5     DG Chest Port 1 View CLINICAL DATA:  Hematemesis  EXAM: PORTABLE  CHEST 1 VIEW  COMPARISON:  10/11/2023  FINDINGS: The heart size and mediastinal contours are within normal limits. Both lungs are clear. The visualized skeletal structures are unremarkable.  IMPRESSION: No active disease.  Electronically Signed   By: Franky Crease M.D.   On: 04/18/2024 00:30    Past Medical History:  Diagnosis Date   ADD (attention deficit disorder)    Drug abuse (HCC)    Medical history non-contributory     Past Surgical History:  Procedure Laterality Date   NO PAST SURGERIES      Family History  Family history unknown: Yes    Prior to Admission medications   Medication Sig Start Date End Date Taking? Authorizing Provider  acetaminophen  (TYLENOL ) 500 MG tablet Take 1,000 mg by mouth 2 (two) times daily as needed for moderate pain (pain score 4-6), fever or headache.   Yes [provider]  omeprazole  (PRILOSEC  OTC) 20 MG  tablet Take 20 mg by mouth daily.   Yes [provider]  ondansetron  (ZOFRAN -ODT) 4 MG disintegrating tablet Take 4 mg by mouth every 8 (eight) hours as needed for nausea or vomiting.   Yes [provider]  propranolol  (INDERAL ) 10 MG tablet Take 1 tablet (10 mg total) by mouth every 6 (six) hours as needed for up to 15 days (anxiety). 03/19/24 06/15/24 Yes Cleotilde Hoy HERO, NP  sertraline  (ZOLOFT ) 50 MG tablet Take 1 tablet (50 mg total) by mouth daily. Patient not taking: Reported on 04/18/2024 04/04/24   Lenard Calin, MD    Current Facility-Administered Medications  Medication Dose Route Frequency Provider Last Rate Last Admin   folic acid  (FOLVITE ) tablet 1 mg  1 mg Oral Daily Franky Redia SAILOR, MD       lactated ringers  infusion   Intravenous Continuous Franky Redia SAILOR, MD 100 mL/hr at 04/18/24 0432 New Bag at 04/18/24 0432   LORazepam  (ATIVAN ) injection 0-4 mg  0-4 mg Intravenous Q4H Franky Redia SAILOR, MD       Followed by   NOREEN ON 04/20/2024] LORazepam  (ATIVAN ) injection 0-4 mg  0-4 mg Intravenous Q8H Franky Redia SAILOR, MD       LORazepam  (ATIVAN ) tablet 1-4 mg  1-4 mg Oral Q1H PRN Kakrakandy, Arshad N, MD   1 mg at 04/18/24 0749   Or   LORazepam  (ATIVAN ) injection 1-4 mg  1-4 mg Intravenous Q1H PRN Kakrakandy, Arshad N, MD   1 mg at 04/18/24 0431   multivitamin with minerals tablet 1 tablet  1 tablet Oral Daily Franky Redia SAILOR, MD       pantoprazole  (PROTONIX ) injection 40 mg  40 mg Intravenous Q12H Franky Redia SAILOR, MD       prochlorperazine  (COMPAZINE ) injection 5 mg  5 mg Intravenous Q4H PRN Kakrakandy, Arshad N, MD   5 mg at 04/18/24 0518   thiamine  (VITAMIN B1) tablet 100 mg  100 mg Oral Daily Franky Redia SAILOR, MD       Or   thiamine  (VITAMIN B1) injection 100 mg  100 mg Intravenous Daily Franky Redia SAILOR, MD       Current Outpatient Medications  Medication Sig Dispense Refill   acetaminophen  (TYLENOL ) 500 MG tablet Take 1,000  mg by mouth 2 (two) times daily as needed for moderate pain (pain score 4-6), fever or headache.     omeprazole  (PRILOSEC  OTC) 20 MG tablet Take 20 mg by mouth daily.     ondansetron  (ZOFRAN -ODT) 4 MG disintegrating tablet Take 4 mg by mouth every  8 (eight) hours as needed for nausea or vomiting.     propranolol  (INDERAL ) 10 MG tablet Take 1 tablet (10 mg total) by mouth every 6 (six) hours as needed for up to 15 days (anxiety). 60 tablet 0   sertraline  (ZOLOFT ) 50 MG tablet Take 1 tablet (50 mg total) by mouth daily. (Patient not taking: Reported on 04/18/2024) 30 tablet 0    Allergies as of 04/18/2024 - Review Complete 04/18/2024  Allergen Reaction Noted   Benadryl [diphenhydramine hcl] Other (See Comments) 07/28/2012   Reglan  [metoclopramide ] Other (See Comments) 05/11/2023    Social History   Socioeconomic History   Marital status: Single    Spouse name: Not on file   Number of children: Not on file   Years of education: Not on file   Highest education level: Not on file  Occupational History   Not on file  Tobacco Use   Smoking status: Former    Current packs/day: 1.50    Average packs/day: 1.5 packs/day for 5.0 years (7.5 ttl pk-yrs)    Types: Cigarettes   Smokeless tobacco: Never   Tobacco comments:    1-2 packs per day/5 years  Vaping Use   Vaping status: Never Used  Substance and Sexual Activity   Alcohol use: Yes    Alcohol/week: 8.0 standard drinks of alcohol    Types: 8 Shots of liquor per week    Comment: a few shots/5-6 days/week   Drug use: Yes    Types: Marijuana    Comment: once a week   Sexual activity: Yes  Other Topics Concern   Not on file  Social History Narrative   Pt employed and living with mother who he identifies as his main social support. Pt has not taken his meds for the last 6 months. Pt has trouble getting medications because he does not have insurance.   Social Drivers of Corporate investment banker Strain: Low Risk  (09/29/2022)    Received from [facility]   Overall Financial Resource Strain (CARDIA)    Difficulty of Paying Living Expenses: Not very hard  Food Insecurity: No Food Insecurity (04/02/2024)   Hunger Vital Sign    Worried About Running Out of Food in the Last Year: Never true    Ran Out of Food in the Last Year: Never true  Transportation Needs: No Transportation Needs (04/02/2024)   PRAPARE - Administrator, Civil Service (Medical): No    Lack of Transportation (Non-Medical): No  Physical Activity: Sufficiently Active (09/29/2022)   Received from [facility]   Exercise Vital Sign    On average, how many days per week do you engage in moderate to strenuous exercise (like a brisk walk)?: 3 days    On average, how many minutes do you engage in exercise at this level?: 120 min  Stress: Stress Concern Present (09/29/2022)   Received from [facility] Occupational [facility] - Occupational Stress Questionnaire    Feeling of Stress : To some extent  Social Connections: Socially Isolated (09/29/2022)   Received from [facility]   Social Connection and Isolation Panel    In a typical week, how many times do you talk on the phone with family, friends, or neighbors?: More than three times a week    How often do you get together with friends or relatives?: More than three times a week    How often do you attend church or religious  services?: Never    Do you belong to any clubs or organizations such as church groups, unions, fraternal or athletic groups, or school groups?: No    How often do you attend meetings of the clubs or organizations you belong to?: Never    Are you married, widowed, divorced, separated, never married, or living with a partner?: Never married  Intimate Partner Violence: Not At Risk (04/02/2024)   Humiliation, Afraid, Rape, and Kick questionnaire    Fear of Current or Ex-Partner: No    Emotionally Abused: No    Physically Abused: No    Sexually  Abused: No     Code Status   Code Status: Full Code  Review of Systems: All systems reviewed and negative except where noted in HPI.  Physical Exam: Vital signs in last 24 hours: Temp:  [97.4 F (36.3 C)-97.9 F (36.6 C)] 97.8 F (36.6 C) (07/10 0830) Pulse Rate:  [97-117] 98 (07/10 0830) Resp:  [18-21] 18 (07/10 0830) BP: (109-144)/(72-88) 144/84 (07/10 0830) SpO2:  [94 %-97 %] 97 % (07/10 0830) Weight:  [99.8 kg] 99.8 kg (07/10 0830)    General:  Pleasant male in NAD Psych:  Cooperative. Normal mood and affect Eyes: Pupils equal Ears:  Normal auditory acuity Nose: No deformity, discharge or lesions Neck:  Supple, no masses felt Lungs:  Clear to auscultation.  Heart:  Regular rate, regular rhythm.  Abdomen:  Soft, nondistended, nontender, active bowel sounds, no masses felt Rectal :  Deferred Msk: Symmetrical without gross deformities.  Neurologic:  Alert, oriented, grossly normal neurologically Extremities : No edema Skin:  Intact without significant lesions.    Intake/Output from previous day: 07/09 0701 - 07/10 0700 In: -  Out: 600 [Emesis/NG output:600] Intake/Output this shift:  No intake/output data recorded.   Vina Dasen, NP-C   04/18/2024, 9:29 AM   I have taken an interval history, thoroughly reviewed the chart and examined the patient. I agree with the Advanced Practitioner's note, impression and recommendations, and have recorded additional findings, impressions and recommendations below. I performed a substantive portion of this encounter (>50% time spent), including a complete performance of the medical decision making.  My additional thoughts are as follows:  Hematemesis with coffee-ground's emesis and bright red blood, hemoglobin remains normal although down from baseline.  BUN normal, hemodynamically stable and exam benign. Mild LFT abnormality from alcohol use, lower than it was last year.  He is somewhat tremulous and thankfully on a  CIWA protocol.  Upper endoscopy tomorrow, and he was agreeable after discussion of procedure and risks and his mother was present during my visit.  The benefits and risks of the planned procedure(s) were described in detail with the patient or (when appropriate) their health care proxy.  Risks were outlined as including, but not limited to, bleeding, infection, perforation, adverse medication reaction leading to cardiac or pulmonary decompensation, pancreatitis (if ERCP).  The limitation of incomplete mucosal visualization was also discussed.  No guarantees or warranties were given.    Victory LITTIE Brand III Office:[phone]

## 2024-04-18 NOTE — H&P (Signed)
 History and Physical    RANNIE CRANEY FMW:[id number] DOB: 04-24-1991 DOA: 04/18/2024  Patient coming from: Home.  Chief Complaint: Throwing up blood.  HPI: Brett Sanford is a 33 y.o. male with history of alcohol abuse, depression was brought to the ER after patient had multiple episodes of hematemesis since last evening.  Patient states he has not had any GI bleeding before but was told he had gastritis.  Denies taking any NSAIDs or Goody powders.  Recently has been started on propranolol  for anxiety which he takes as needed.  Due to persistent hematemesis patient's mother brought him to the ER.  Has any chest pain abdominal pain.  ED Course: In the ER patient was found to be tachycardic.  Hemoglobin 16.1 at around baseline.  Platelets 228 alcohol levels were 319.  Patient was noticed to have syncopal episode lasting for few seconds in the ER 2 or 3 times.  These syncopal episodes happened while in the ER.  I discussed with patient's nurse who witnessed 2 of them states that they were very brief and patient lost consciousness or appeared to have lost consciousness but easily arousable.  Patient started on Protonix  admitted for further workup.  Review of Systems: As per HPI, rest all negative.   Past Medical History:  Diagnosis Date   ADD (attention deficit disorder)    Drug abuse (HCC)    Medical history non-contributory     Past Surgical History:  Procedure Laterality Date   NO PAST SURGERIES       reports that he has quit smoking. His smoking use included cigarettes. He has a 7.5 pack-year smoking history. He has never used smokeless tobacco. He reports current alcohol use of about 8.0 standard drinks of alcohol per week. He reports current drug use. Drug: Marijuana.  Allergies  Allergen Reactions   Benadryl [Diphenhydramine Hcl] Other (See Comments)    Caused hyperactivity   Metoclopramide  Other (See Comments)    Jittery    Family History  Family history unknown: Yes     Prior to Admission medications   Medication Sig Start Date End Date Taking? Authorizing Provider  omeprazole  (PRILOSEC  OTC) 20 MG tablet Take 20 mg by mouth daily as needed.    [provider]  propranolol  (INDERAL ) 10 MG tablet Take 1 tablet (10 mg total) by mouth every 6 (six) hours as needed for up to 15 days (anxiety). 03/19/24 04/03/24  Cleotilde Hoy HERO, NP  sertraline  (ZOLOFT ) 50 MG tablet Take 1 tablet (50 mg total) by mouth daily. 04/04/24   Lenard Calin, MD    Physical Exam: Constitutional: Moderately built and nourished. Vitals:   04/18/24 0018  BP: 130/84  Pulse: (!) 106  Resp: (!) 21  Temp: (!) 97.4 F (36.3 C)  TempSrc: Oral  SpO2: 96%   Eyes: Anicteric no pallor. ENMT: No discharge from the ears/nose or mouth. Neck: No mass felt.  No neck rigidity. Respiratory: No rhonchi or crepitations. Cardiovascular: S1-S2 heard. Abdomen: Soft nontender bowel sound present. Musculoskeletal: No edema. Skin: No rash. Neurologic: Alert awake oriented time place and person.  Moves all extremities. Psychiatric: Appears normal.  Normal affect.   Labs on Admission: I have personally reviewed following labs and imaging studies  CBC: Recent Labs  Lab 04/18/24 0018  WBC 8.2  NEUTROABS 4.1  HGB 16.1  HCT 46.0  MCV 93.1  PLT 228   Basic Metabolic Panel: Recent Labs  Lab 04/18/24 0018  NA 138  K 3.6  CL 101  CO2 20*  GLUCOSE 108*  BUN 8  CREATININE 0.86  CALCIUM  9.5   GFR: CrCl cannot be calculated (Unknown ideal weight.). Liver Function Tests: Recent Labs  Lab 04/18/24 0018  AST 56*  ALT 33  ALKPHOS 52  BILITOT 1.2  PROT 7.2  ALBUMIN 4.3   Recent Labs  Lab 04/18/24 0018  LIPASE 37   Recent Labs  Lab 04/18/24 0018  AMMONIA 26   Coagulation Profile: Recent Labs  Lab 04/18/24 0018  INR 1.0   Cardiac Enzymes: No results for input(s): CKTOTAL, CKMB, CKMBINDEX, TROPONINI in the last 168 hours. BNP (last 3 results) No  results for input(s): PROBNP in the last 8760 hours. HbA1C: No results for input(s): HGBA1C in the last 72 hours. CBG: No results for input(s): GLUCAP in the last 168 hours. Lipid Profile: No results for input(s): CHOL, HDL, LDLCALC, TRIG, CHOLHDL, LDLDIRECT in the last 72 hours. Thyroid  Function Tests: No results for input(s): TSH, T4TOTAL, FREET4, T3FREE, THYROIDAB in the last 72 hours. Anemia Panel: No results for input(s): VITAMINB12, FOLATE, FERRITIN, TIBC, IRON, RETICCTPCT in the last 72 hours. Urine analysis:    Component Value Date/Time   COLORURINE YELLOW 04/25/2020 1120   APPEARANCEUR CLEAR 04/25/2020 1120   LABSPEC 1.024 04/25/2020 1120   PHURINE 6.0 04/25/2020 1120   GLUCOSEU NEGATIVE 04/25/2020 1120   HGBUR NEGATIVE 04/25/2020 1120   BILIRUBINUR NEGATIVE 04/25/2020 1120   KETONESUR NEGATIVE 04/25/2020 1120   PROTEINUR NEGATIVE 04/25/2020 1120   NITRITE NEGATIVE 04/25/2020 1120   LEUKOCYTESUR NEGATIVE 04/25/2020 1120   Sepsis Labs: @LABRCNTIP (procalcitonin:4,lacticidven:4) )No results found for this or any previous visit (from the past 240 hours).   Radiological Exams on Admission: DG Chest Port 1 View Result Date: 04/18/2024 CLINICAL DATA:  Hematemesis EXAM: PORTABLE CHEST 1 VIEW COMPARISON:  10/11/2023 FINDINGS: The heart size and mediastinal contours are within normal limits. Both lungs are clear. The visualized skeletal structures are unremarkable. IMPRESSION: No active disease. Electronically Signed   By: Franky Crease M.D.   On: 04/18/2024 00:30    EKG: Independently reviewed.  Sinus tachycardia.  Assessment/Plan Principal Problem:   Hematemesis Active Problems:   MDD (major depressive disorder), recurrent episode, severe (HCC)   Alcohol abuse   Syncope    Hematemesis -     among the differentials include alcoholic gastritis or Mallory-Weiss tear.  Will keep patient on Protonix  consult GI.  Keep NPO.  Check serial  CBC. Possible syncope was witnessed to have 2 or 3 episodes of syncope lasting few seconds each time easily arousable after the incident.  Will monitor in telemetry.  Check 2D echo. Alcohol abuse on CIWA protocol.  IV thiamine .  Social work consult.  Advised about quitting. History of depression and anxiety takes as needed propranolol  and was advised to take Zoloft  which patient was not taking.  Since patient has hematemesis with alcohol withdrawal will need close monitoring and further workup and more than 2 midnight stay.   DVT prophylaxis: SCDs. Code Status: Full code. Family Communication: Gust with patient. Disposition Plan: Monitored bed. Consults called: Solicitor.  Child psychotherapist. Admission status: Observation.

## 2024-04-18 NOTE — ED Notes (Signed)
 CCMD contacted to admit the patient for cardiac monitoring.

## 2024-04-18 NOTE — ED Notes (Signed)
 Pt states that I dont think I need to go home. I want to get help for my drinking problem pt requesting to be admitted to get help with alcohol detox

## 2024-04-19 ENCOUNTER — Encounter (HOSPITAL_COMMUNITY): Payer: Self-pay | Admitting: Internal Medicine

## 2024-04-19 ENCOUNTER — Encounter (HOSPITAL_COMMUNITY)
Admission: EM | Disposition: A | Discharge status: Home / Self Care | Payer: Self-pay | Source: Home / Self Care | Attending: Emergency Medicine

## 2024-04-19 ENCOUNTER — Observation Stay (HOSPITAL_COMMUNITY): Payer: Self-pay

## 2024-04-19 ENCOUNTER — Other Ambulatory Visit (HOSPITAL_COMMUNITY): Payer: Self-pay

## 2024-04-19 ENCOUNTER — Observation Stay (HOSPITAL_BASED_OUTPATIENT_CLINIC_OR_DEPARTMENT_OTHER): Payer: Self-pay

## 2024-04-19 ENCOUNTER — Other Ambulatory Visit: Payer: Self-pay

## 2024-04-19 DIAGNOSIS — K3189 Other diseases of stomach and duodenum: Secondary | ICD-10-CM

## 2024-04-19 DIAGNOSIS — K2100 Gastro-esophageal reflux disease with esophagitis, without bleeding: Secondary | ICD-10-CM

## 2024-04-19 DIAGNOSIS — F32A Depression, unspecified: Secondary | ICD-10-CM

## 2024-04-19 DIAGNOSIS — K2101 Gastro-esophageal reflux disease with esophagitis, with bleeding: Secondary | ICD-10-CM

## 2024-04-19 DIAGNOSIS — K2970 Gastritis, unspecified, without bleeding: Secondary | ICD-10-CM

## 2024-04-19 HISTORY — PX: ESOPHAGOGASTRODUODENOSCOPY: SHX5428

## 2024-04-19 HISTORY — PX: BIOPSY OF SKIN SUBCUTANEOUS TISSUE AND/OR MUCOUS MEMBRANE: SHX6741

## 2024-04-19 LAB — CBC WITH DIFFERENTIAL/PLATELET
Abs Immature Granulocytes: 0.06 K/uL (ref 0.00–0.07)
Basophils Absolute: 0.1 K/uL (ref 0.0–0.1)
Basophils Relative: 1 %
Eosinophils Absolute: 0.2 K/uL (ref 0.0–0.5)
Eosinophils Relative: 3 %
HCT: 41.2 % (ref 39.0–52.0)
Hemoglobin: 14.4 g/dL (ref 13.0–17.0)
Immature Granulocytes: 1 %
Lymphocytes Relative: 34 %
Lymphs Abs: 2.1 K/uL (ref 0.7–4.0)
MCH: 32.9 pg (ref 26.0–34.0)
MCHC: 35 g/dL (ref 30.0–36.0)
MCV: 94.1 fL (ref 80.0–100.0)
Monocytes Absolute: 0.8 K/uL (ref 0.1–1.0)
Monocytes Relative: 12 %
Neutro Abs: 3 K/uL (ref 1.7–7.7)
Neutrophils Relative %: 49 %
Platelets: 161 K/uL (ref 150–400)
RBC: 4.38 MIL/uL (ref 4.22–5.81)
RDW: 13 % (ref 11.5–15.5)
WBC: 6.1 K/uL (ref 4.0–10.5)
nRBC: 0 % (ref 0.0–0.2)

## 2024-04-19 LAB — COMPREHENSIVE METABOLIC PANEL WITH GFR
ALT: 26 U/L (ref 0–44)
AST: 31 U/L (ref 15–41)
Albumin: 3.4 g/dL — ABNORMAL LOW (ref 3.5–5.0)
Alkaline Phosphatase: 38 U/L (ref 38–126)
Anion gap: 11 (ref 5–15)
BUN: <5 mg/dL — ABNORMAL LOW (ref 6–20)
CO2: 26 mmol/L (ref 22–32)
Calcium: 9.2 mg/dL (ref 8.9–10.3)
Chloride: 102 mmol/L (ref 98–111)
Creatinine, Ser: 0.83 mg/dL (ref 0.61–1.24)
GFR, Estimated: >60 mL/min (ref >60)
Glucose, Bld: 89 mg/dL (ref 70–99)
Potassium: 2.8 mmol/L — ABNORMAL LOW (ref 3.5–5.1)
Sodium: 139 mmol/L (ref 135–145)
Total Bilirubin: 2.4 mg/dL — ABNORMAL HIGH (ref 0.0–1.2)
Total Protein: 6 g/dL — ABNORMAL LOW (ref 6.5–8.1)

## 2024-04-19 LAB — HEPATITIS C ANTIBODY: HCV Ab: NONREACTIVE

## 2024-04-19 LAB — MAGNESIUM: Magnesium: 1.6 mg/dL — ABNORMAL LOW (ref 1.7–2.4)

## 2024-04-19 LAB — HEPATITIS B SURFACE ANTIGEN: Hepatitis B Surface Ag: NONREACTIVE

## 2024-04-19 SURGERY — EGD (ESOPHAGOGASTRODUODENOSCOPY)
Anesthesia: Monitor Anesthesia Care

## 2024-04-19 MED ORDER — PROPOFOL 10 MG/ML IV BOLUS
INTRAVENOUS | Status: DC | PRN
  Administered 2024-04-19: 20 mg via INTRAVENOUS
  Administered 2024-04-19: 10 mg via INTRAVENOUS
  Administered 2024-04-19 (×3): 30 mg via INTRAVENOUS

## 2024-04-19 MED ORDER — THIAMINE HCL 100 MG PO TABS
100.0000 mg | ORAL_TABLET | Freq: Every day | ORAL | 0 refills | Status: DC
  Filled 2024-04-19: qty 30, 30d supply, fill #0

## 2024-04-19 MED ORDER — LIDOCAINE 2% (20 MG/ML) 5 ML SYRINGE
INTRAMUSCULAR | Status: DC | PRN
  Administered 2024-04-19: 80 mg via INTRAVENOUS

## 2024-04-19 MED ORDER — CHLORDIAZEPOXIDE HCL 25 MG PO CAPS
ORAL_CAPSULE | ORAL | 0 refills | Status: AC
  Filled 2024-04-19: qty 5, 2d supply, fill #0

## 2024-04-19 MED ORDER — PANTOPRAZOLE SODIUM 40 MG PO TBEC: 40.0000 mg | DELAYED_RELEASE_TABLET | Freq: Two times a day (BID) | ORAL | Status: DC

## 2024-04-19 MED ORDER — POTASSIUM CHLORIDE CRYS ER 20 MEQ PO TBCR
40.0000 meq | EXTENDED_RELEASE_TABLET | ORAL | Status: AC
  Administered 2024-04-19 (×2): 40 meq via ORAL
  Filled 2024-04-19 (×2): qty 2

## 2024-04-19 MED ORDER — PROPOFOL 500 MG/50ML IV EMUL
INTRAVENOUS | Status: DC | PRN
  Administered 2024-04-19: 150 ug/kg/min via INTRAVENOUS

## 2024-04-19 MED ORDER — FOLIC ACID 1 MG PO TABS
1.0000 mg | ORAL_TABLET | Freq: Every day | ORAL | 0 refills | Status: DC
  Filled 2024-04-19: qty 30, 30d supply, fill #0

## 2024-04-19 MED ORDER — MAGNESIUM OXIDE -MG SUPPLEMENT 400 (240 MG) MG PO TABS
800.0000 mg | ORAL_TABLET | Freq: Every day | ORAL | Status: DC
  Administered 2024-04-19: 800 mg via ORAL
  Filled 2024-04-19: qty 2

## 2024-04-19 MED ORDER — PANTOPRAZOLE SODIUM 40 MG PO TBEC
40.0000 mg | DELAYED_RELEASE_TABLET | Freq: Two times a day (BID) | ORAL | 0 refills | Status: DC
  Filled 2024-04-19: qty 112, 56d supply, fill #0

## 2024-04-19 MED ORDER — SODIUM CHLORIDE 0.9 % IV SOLN
INTRAVENOUS | Status: AC | PRN
  Administered 2024-04-19: 250 mL via INTRAVENOUS

## 2024-04-19 NOTE — Interval H&P Note (Signed)
 History and Physical Interval Note:  04/19/2024 9:06 AM  Brett Sanford  has presented today for surgery, with the diagnosis of Hematemesis , coffee-ground emesis.  The various methods of treatment have been discussed with the patient and family. After consideration of risks, benefits and other options for treatment, the patient has consented to  Procedure(s): EGD (ESOPHAGOGASTRODUODENOSCOPY) (N/A) as a surgical intervention.  The patient's history has been reviewed, patient examined, no change in status, stable for surgery.  I have reviewed the patient's chart and labs.  Questions were answered to the patient's satisfaction.     Brett Sanford

## 2024-04-19 NOTE — Progress Notes (Signed)
 Patient excited for discharged. All questions answered. TOC meds given to patient. Ride in room.

## 2024-04-19 NOTE — Anesthesia Procedure Notes (Signed)
 Procedure Name: MAC Date/Time: 04/19/2024 8:58 AM  Performed by: Roslynn Waddell LABOR, CRNAPre-anesthesia Checklist: Patient identified, Emergency Drugs available, Suction available, Patient being monitored and Timeout performed Patient Re-evaluated:Patient Re-evaluated prior to induction Oxygen Delivery Method: Simple face mask

## 2024-04-19 NOTE — Anesthesia Postprocedure Evaluation (Signed)
 Anesthesia Post Note  Patient: Brett Sanford  Procedure(s) Performed: EGD (ESOPHAGOGASTRODUODENOSCOPY) BIOPSY, SKIN, SUBCUTANEOUS TISSUE, OR MUCOUS MEMBRANE     Patient location during evaluation: PACU Anesthesia Type: MAC Level of consciousness: awake and alert Pain management: pain level controlled Vital Signs Assessment: post-procedure vital signs reviewed and stable Respiratory status: spontaneous breathing, nonlabored ventilation, respiratory function stable and patient connected to nasal cannula oxygen Cardiovascular status: stable and blood pressure returned to baseline Postop Assessment: no apparent nausea or vomiting Anesthetic complications: no   No notable events documented.  Last Vitals:  Vitals:   04/19/24 0940 04/19/24 0950  BP: (!) 164/108 (!) 159/99  Pulse: 98 87  Resp: (!) 21 (!) 22  Temp:    SpO2: 97% 96%    Last Pain:  Vitals:   04/19/24 0950  TempSrc:   PainSc: 0-No pain                 Rome Ade

## 2024-04-19 NOTE — Plan of Care (Signed)

## 2024-04-19 NOTE — Anesthesia Preprocedure Evaluation (Signed)
 Anesthesia Evaluation  Patient identified by MRN, date of birth, ID band Patient awake    Reviewed: Allergy & Precautions, NPO status , Patient's Chart, lab work & pertinent test results  History of Anesthesia Complications Negative for: history of anesthetic complications  Airway Mallampati: III  TM Distance: >3 FB Neck ROM: Full    Dental no notable dental hx. (+) Teeth Intact   Pulmonary neg pulmonary ROS, neg sleep apnea, neg COPD, Patient abstained from smoking.Not current smoker, former smoker   Pulmonary exam normal breath sounds clear to auscultation       Cardiovascular Exercise Tolerance: Good METS(-) hypertension(-) CAD and (-) Past MI negative cardio ROS (-) dysrhythmias  Rhythm:Regular Rate:Normal - Systolic murmurs    Neuro/Psych  Headaches PSYCHIATRIC DISORDERS  Depression       GI/Hepatic ,neg GERD  ,,(+)     substance abuse  alcohol use and marijuana usePatient last had hematemesis over 24hrs ago. Denies nausea or vomiting today, has been npo   Endo/Other  neg diabetes    Renal/GU negative Renal ROS     Musculoskeletal   Abdominal   Peds  Hematology   Anesthesia Other Findings Past Medical History: No date: ADD (attention deficit disorder) No date: Drug abuse (HCC) No date: Medical history non-contributory  Reproductive/Obstetrics                              Anesthesia Physical Anesthesia Plan  ASA: 2  Anesthesia Plan: General   Post-op Pain Management: Minimal or no pain anticipated   Induction: Intravenous  PONV Risk Score and Plan: 2 and Propofol  infusion, TIVA and Ondansetron   Airway Management Planned: Nasal Cannula  Additional Equipment: None  Intra-op Plan:   Post-operative Plan:   Informed Consent: I have reviewed the patients History and Physical, chart, labs and discussed the procedure including the risks, benefits and alternatives for the  proposed anesthesia with the patient or authorized representative who has indicated his/her understanding and acceptance.     Dental advisory given  Plan Discussed with: CRNA and Surgeon  Anesthesia Plan Comments: (Discussed risks of anesthesia with patient, including possibility of difficulty with spontaneous ventilation under anesthesia necessitating airway intervention, PONV, and rare risks such as cardiac or respiratory or neurological events, aspiration, and allergic reactions. Discussed the role of CRNA in patient's perioperative care. Patient understands.)        Anesthesia Quick Evaluation

## 2024-04-19 NOTE — Transfer of Care (Signed)
 Immediate Anesthesia Transfer of Care Note  Patient: Brett Sanford  Procedure(s) Performed: EGD (ESOPHAGOGASTRODUODENOSCOPY) BIOPSY, SKIN, SUBCUTANEOUS TISSUE, OR MUCOUS MEMBRANE  Patient Location: Endoscopy Unit  Anesthesia Type:MAC  Level of Consciousness: awake, alert , and oriented  Airway & Oxygen Therapy: Patient Spontanous Breathing  Post-op Assessment: Report given to RN and Post -op Vital signs reviewed and stable  Post vital signs: Reviewed and stable  Last Vitals:  Vitals Value Taken Time  BP 159/99 04/19/24 09:50  Temp 36.6 C 04/19/24 09:23  Pulse 91 04/19/24 09:51  Resp 16 04/19/24 09:51  SpO2 95 % 04/19/24 09:51  Vitals shown include unfiled device data.  Last Pain:  Vitals:   04/19/24 0940  TempSrc:   PainSc: 0-No pain         Complications: No notable events documented.

## 2024-04-19 NOTE — Op Note (Signed)
 [facility] Patient Name: Brett Sanford Procedure Date : 04/19/2024 MRN: [id number] Attending MD: Victory CROME. Legrand , MD, [id number] Date of Birth: Sep 07, 1991 CSN: [id number] Age: 33 Admit Type: Inpatient Procedure:                Upper GI endoscopy Indications:              Hematemesis Providers:                Victory L. Legrand, MD, Gregoria Pierce, RN,                            Haskel Chris, Technician Referring MD:             [facility] Medicines:                Monitored Anesthesia Care Complications:            No immediate complications. Estimated Blood Loss:     Estimated blood loss was minimal. Procedure:                Pre-Anesthesia Assessment:                           - Prior to the procedure, a History and Physical                            was performed, and patient medications and                            allergies were reviewed. The patient's tolerance of                            previous anesthesia was also reviewed. The risks                            and benefits of the procedure and the sedation                            options and risks were discussed with the patient.                            All questions were answered, and informed consent                            was obtained. Prior Anticoagulants: The patient has                            taken no anticoagulant or antiplatelet agents. ASA                            Grade Assessment: II - A patient with mild systemic                            disease. After reviewing the risks and benefits,  the patient was deemed in satisfactory condition to                            undergo the procedure.                           After obtaining informed consent, the endoscope was                            passed under direct vision. Throughout the                            procedure, the patient's blood pressure, pulse, and                            oxygen  saturations were monitored continuously. The                            [id number]-H190 ([id number]) Olympus endoscope was introduced                            through the mouth, and advanced to the second part                            of duodenum. The upper GI endoscopy was                            accomplished without difficulty. The patient                            tolerated the procedure well. Scope In: Scope Out: Findings:      LA Grade C (one or more mucosal breaks continuous between tops of 2 or       more mucosal folds, less than 75% circumference) esophagitis with no       bleeding was found in the lower third of the esophagus.      Patchy moderate inflammation characterized by congestion (edema),       erythema and granularity was found in the gastric body. Several biopsies       were obtained in the gastric body and in the gastric antrum with cold       forceps for histology.      The exam of the stomach was otherwise normal.      The cardia and gastric fundus were normal on retroflexion.      The examined duodenum was normal. Impression:               - LA Grade C reflux esophagitis with no bleeding.                            (Episodic vomiting may be contributing as well)                           - Gastritis. (Alcohol, possibly NSAIDs if any use,  biopsies pending for H. pylori)                           - Normal examined duodenum.                           - Several biopsies were obtained in the gastric                            body and in the gastric antrum. Recommendation:           - Return patient to hospital ward for possible                            discharge same day.                           - Resume regular diet.                           - Pantoprazole  40 mg by mouth before meals twice                            daily for 8 weeks                           (Omeprazole  can be substituted if needed for cost                             reasons after discharge)                           Patient will be contacted by our office with biopsy                            results                           Make every effort at alcohol abstinence Procedure Code(s):        --- Professional ---                           [phone], Esophagogastroduodenoscopy, flexible,                            transoral; with biopsy, single or multiple Diagnosis Code(s):        --- Professional ---                           K21.00, Gastro-esophageal reflux disease with                            esophagitis, without bleeding                           K29.70, Gastritis, unspecified, without bleeding  K92.0, Hematemesis CPT copyright 2022 American [employer] Association. All rights reserved. The codes documented in this report are preliminary and upon coder review may  be revised to meet current compliance requirements. Lorilynn Lehr L. Legrand, MD 04/19/2024 9:23:06 AM This report has been signed electronically. Number of Addenda: 0

## 2024-04-20 NOTE — Discharge Summary (Signed)
 Physician Discharge Summary   Patient: Brett Sanford MRN: [id number] DOB: 1991/06/28  Admit date:     04/18/2024  Discharge date: 04/19/2024  Discharge Physician: Yetta Blanch  PCP: Pcp, No  Recommendations at discharge: Follow-up with PCP in 1 week. Patient has scheduled follow-up with [facility] for alcohol rehabilitation assistance.   Follow-up Information     [facility], [facility] Follow up.   Why: the Appointment As Scheduled Contact information: [address] [place] [address] [phone]         PCP. Schedule an appointment as soon as possible for a visit in 2 week(s).   Why: with BMP lab to look at kidney and electrolytes               Discharge Diagnoses: Principal Problem:   Hematemesis Active Problems:   MDD (major depressive disorder), recurrent episode, severe (HCC)   Alcohol abuse   Syncope   LFT elevation   Gastroesophageal reflux disease with esophagitis and hemorrhage   Gastritis without bleeding  Hospital Course: Patient with PMH of alcohol abuse and depression presented to the hospital with complaints of vomiting of blood. Patient denies any prior normal vomiting and reports that he is just started with vomiting of blood. Reports to me that he only had 1 episode of vomiting prior to coming to the ER and had 1 episode in the ER. Denies any bleeding anywhere else.  Hematemesis. Likely secondary to gastritis. Underwent EGD. LA Grade C reflux esophagitis with no bleeding.  No active bleeding seen. PPI twice daily recommended. Alcohol cessation recommended.  Alcohol abuse. Patient's alcohol level was more than 300 at the time of admission. Patient does appear to have some tremors. Improved significantly with initiation of Librium . Will continue Librium  taper outpatient. Already has plan to go to [facility] for cessation assistance.  History of depression and anxiety. Active with [facility] outpatient. Continue home  medication.  Reported syncope. Currently asymptomatic. Suspect vasovagal. No indication for echocardiogram. No events on telemetry. Outpatient follow-up with PCP recommended.  Obesity. Placing the patient at high risk of poor outcome. Recommend weight loss. Body mass index is 33.45 kg/m.   Consultants:  GI  Procedures performed:  EGD  DISCHARGE MEDICATION: Allergies as of 04/19/2024       Reactions   Benadryl [diphenhydramine Hcl] Other (See Comments)   Hyperactivity    Reglan  [metoclopramide ] Other (See Comments)   Jittery        Medication List     STOP taking these medications    omeprazole  20 MG tablet Commonly known as: PRILOSEC  OTC   ondansetron  4 MG disintegrating tablet Commonly known as: ZOFRAN -ODT       TAKE these medications    acetaminophen  500 MG tablet Commonly known as: TYLENOL  Take 1,000 mg by mouth 2 (two) times daily as needed for moderate pain (pain score 4-6), fever or headache.   chlordiazePOXIDE  25 MG capsule Commonly known as: LIBRIUM  Take 25 mg twice a day for two days, then take 25 mg once day for one day.   folic acid  1 MG tablet Commonly known as: FOLVITE  Take 1 tablet (1 mg total) by mouth daily.   pantoprazole  40 MG tablet Commonly known as: PROTONIX  Take 1 tablet (40 mg total) by mouth 2 (two) times daily before a meal.   propranolol  10 MG tablet Commonly known as: INDERAL  Take 1 tablet (10 mg total) by mouth every 6 (six) hours as needed for up to 15 days (anxiety).  sertraline  50 MG tablet Commonly known as: ZOLOFT  Take 1 tablet (50 mg total) by mouth daily.   thiamine  100 MG tablet Commonly known as: VITAMIN B1 Take 1 tablet (100 mg total) by mouth daily.       Disposition: Home Diet recommendation: Regular diet  Discharge Exam: Vitals:   04/19/24 0940 04/19/24 0950 04/19/24 1122 04/19/24 1439  BP: (!) 164/108 (!) 159/99 (!) 142/96 (!) 137/94  Pulse: 98 87 84 96  Resp: (!) 21 (!) 22 16 17   Temp:    98.8 F (37.1 C) 98.4 F (36.9 C)  TempSrc:   Oral Oral  SpO2: 97% 96% 100% 99%  Weight:      Height:       General: Appear in no distress; no visible Abnormal Neck Mass Or lumps, Conjunctiva normal Cardiovascular: S1 and S2 Present, no Murmur, Respiratory: good respiratory effort, Bilateral Air entry present and CTA, no Crackles, no wheezes Abdomen: Bowel Sound present, Non tender  Extremities: no Pedal edema Neurology: alert and oriented to time, place, and person  Filed Weights   04/18/24 0830  Weight: 99.8 kg   Condition at discharge: stable  The results of significant diagnostics from this hospitalization (including imaging, microbiology, ancillary and laboratory) are listed below for reference.   Imaging Studies: DG Chest Port 1 View Result Date: 04/18/2024 CLINICAL DATA:  Hematemesis EXAM: PORTABLE CHEST 1 VIEW COMPARISON:  10/11/2023 FINDINGS: The heart size and mediastinal contours are within normal limits. Both lungs are clear. The visualized skeletal structures are unremarkable. IMPRESSION: No active disease. Electronically Signed   By: Franky Crease M.D.   On: 04/18/2024 00:30    Microbiology: Results for orders placed or performed during the hospital encounter of 04/18/24  MRSA Next Gen by PCR, Nasal     Status: None   Collection Time: 04/18/24  7:41 PM   Specimen: Nasal Mucosa; Nasal Swab  Result Value Ref Range Status   MRSA by PCR Next Gen NOT DETECTED NOT DETECTED Final    Comment: (NOTE) The GeneXpert MRSA Assay (FDA approved for NASAL specimens only), is one component of a comprehensive MRSA colonization surveillance program. It is not intended to diagnose MRSA infection nor to guide or monitor treatment for MRSA infections. Test performance is not FDA approved in patients less than 7 years old. Performed at [facility], [address] [place], [place] [address]    Labs: CBC: Recent Labs  Lab 04/18/24 0018 04/18/24 0535 04/18/24 1827  04/19/24 0608  WBC 8.2 8.2 6.7 6.1  NEUTROABS 4.1  --   --  3.0  HGB 16.1 14.6 14.2 14.4  HCT 46.0 42.3 40.0 41.2  MCV 93.1 94.0 92.8 94.1  PLT 228 181 166 161   Basic Metabolic Panel: Recent Labs  Lab 04/18/24 0018 04/19/24 0608  NA 138 139  K 3.6 2.8*  CL 101 102  CO2 20* 26  GLUCOSE 108* 89  BUN 8 <5*  CREATININE 0.86 0.83  CALCIUM  9.5 9.2  MG  --  1.6*   Liver Function Tests: Recent Labs  Lab 04/18/24 0018 04/19/24 0608  AST 56* 31  ALT 33 26  ALKPHOS 52 38  BILITOT 1.2 2.4*  PROT 7.2 6.0*  ALBUMIN 4.3 3.4*   CBG: No results for input(s): GLUCAP in the last 168 hours.  Discharge time spent: greater than 30 minutes.  Author: Yetta Blanch, MD  [facility] 04/19/2024

## 2024-04-23 LAB — SURGICAL PATHOLOGY

## 2024-04-24 ENCOUNTER — Ambulatory Visit: Payer: Self-pay | Admitting: Gastroenterology

## 2024-04-26 ENCOUNTER — Encounter (HOSPITAL_COMMUNITY): Payer: Self-pay

## 2024-04-26 ENCOUNTER — Emergency Department (HOSPITAL_COMMUNITY): Payer: Self-pay

## 2024-04-26 ENCOUNTER — Other Ambulatory Visit: Payer: Self-pay

## 2024-04-26 ENCOUNTER — Emergency Department (HOSPITAL_COMMUNITY)
Admission: EM | Admit: 2024-04-26 | Discharge: 2024-04-26 | Disposition: A | Discharge status: Home / Self Care | Payer: Self-pay | Attending: Emergency Medicine | Admitting: Emergency Medicine

## 2024-04-26 DIAGNOSIS — F10220 Alcohol dependence with intoxication, uncomplicated: Secondary | ICD-10-CM

## 2024-04-26 DIAGNOSIS — R7402 Elevation of levels of lactic acid dehydrogenase (LDH): Secondary | ICD-10-CM | POA: Insufficient documentation

## 2024-04-26 DIAGNOSIS — R7401 Elevation of levels of liver transaminase levels: Secondary | ICD-10-CM | POA: Insufficient documentation

## 2024-04-26 DIAGNOSIS — Y908 Blood alcohol level of 240 mg/100 ml or more: Secondary | ICD-10-CM | POA: Insufficient documentation

## 2024-04-26 DIAGNOSIS — F10129 Alcohol abuse with intoxication, unspecified: Secondary | ICD-10-CM | POA: Insufficient documentation

## 2024-04-26 DIAGNOSIS — R7989 Other specified abnormal findings of blood chemistry: Secondary | ICD-10-CM

## 2024-04-26 DIAGNOSIS — R748 Abnormal levels of other serum enzymes: Secondary | ICD-10-CM | POA: Insufficient documentation

## 2024-04-26 LAB — I-STAT CG4 LACTIC ACID, ED
Lactic Acid, Venous: 3.4 mmol/L (ref 0.5–1.9)
Lactic Acid, Venous: 4.2 mmol/L (ref 0.5–1.9)

## 2024-04-26 LAB — COMPREHENSIVE METABOLIC PANEL WITH GFR
ALT: 42 U/L (ref 0–44)
AST: 56 U/L — ABNORMAL HIGH (ref 15–41)
Albumin: 3.7 g/dL (ref 3.5–5.0)
Alkaline Phosphatase: 55 U/L (ref 38–126)
Anion gap: 17 — ABNORMAL HIGH (ref 5–15)
BUN: 12 mg/dL (ref 6–20)
CO2: 19 mmol/L — ABNORMAL LOW (ref 22–32)
Calcium: 8.7 mg/dL — ABNORMAL LOW (ref 8.9–10.3)
Chloride: 104 mmol/L (ref 98–111)
Creatinine, Ser: 0.94 mg/dL (ref 0.61–1.24)
GFR, Estimated: >60 mL/min (ref >60)
Glucose, Bld: 87 mg/dL (ref 70–99)
Potassium: 3.6 mmol/L (ref 3.5–5.1)
Sodium: 140 mmol/L (ref 135–145)
Total Bilirubin: 0.9 mg/dL (ref 0.0–1.2)
Total Protein: 6.6 g/dL (ref 6.5–8.1)

## 2024-04-26 LAB — CBC WITH DIFFERENTIAL/PLATELET
Abs Immature Granulocytes: 0.03 K/uL (ref 0.00–0.07)
Basophils Absolute: 0.1 K/uL (ref 0.0–0.1)
Basophils Relative: 1 %
Eosinophils Absolute: 0.1 K/uL (ref 0.0–0.5)
Eosinophils Relative: 2 %
HCT: 48.4 % (ref 39.0–52.0)
Hemoglobin: 16.6 g/dL (ref 13.0–17.0)
Immature Granulocytes: 1 %
Lymphocytes Relative: 37 %
Lymphs Abs: 2.2 K/uL (ref 0.7–4.0)
MCH: 32.5 pg (ref 26.0–34.0)
MCHC: 34.3 g/dL (ref 30.0–36.0)
MCV: 94.7 fL (ref 80.0–100.0)
Monocytes Absolute: 0.7 K/uL (ref 0.1–1.0)
Monocytes Relative: 11 %
Neutro Abs: 2.9 K/uL (ref 1.7–7.7)
Neutrophils Relative %: 48 %
Platelets: 220 K/uL (ref 150–400)
RBC: 5.11 MIL/uL (ref 4.22–5.81)
RDW: 13.7 % (ref 11.5–15.5)
WBC: 6.1 K/uL (ref 4.0–10.5)
nRBC: 0 % (ref 0.0–0.2)

## 2024-04-26 LAB — RAPID URINE DRUG SCREEN, HOSP PERFORMED
Amphetamines: NOT DETECTED
Barbiturates: NOT DETECTED
Benzodiazepines: POSITIVE — AB
Cocaine: NOT DETECTED
Opiates: NOT DETECTED
Tetrahydrocannabinol: POSITIVE — AB

## 2024-04-26 LAB — CK: Total CK: 432 U/L — ABNORMAL HIGH (ref 49–397)

## 2024-04-26 LAB — ETHANOL: Alcohol, Ethyl (B): 370 mg/dL (ref <15)

## 2024-04-26 MED ORDER — SODIUM CHLORIDE 0.9 % IV BOLUS
1000.0000 mL | Freq: Once | INTRAVENOUS | Status: AC
  Administered 2024-04-26: 1000 mL via INTRAVENOUS

## 2024-04-26 MED ORDER — LORAZEPAM 1 MG PO TABS
2.0000 mg | ORAL_TABLET | Freq: Once | ORAL | Status: AC
  Administered 2024-04-26: 2 mg via ORAL
  Filled 2024-04-26: qty 2

## 2024-04-26 NOTE — ED Provider Notes (Signed)
 [place] EMERGENCY DEPARTMENT AT [facility] Provider Note   CSN: [id number] Arrival date & time: 04/26/24  0104     Patient presents with: Altered Mental Status   Brett Sanford is a 33 y.o. male.   The history is provided by the patient and a parent. The history is limited by the condition of the patient (Altered mental status).  Altered Mental Status He has history of attention deficit disorder, alcohol abuse and was brought in because of an episode of altered consciousness.  His mother states that she had been having a normal conversation with him in the evening.  She went to bed and thought he she heard him call out his name.  She found him poorly responsive.  He would wake up and then go back to sleep.  Mentation is gradually improved.  He is not back to his baseline yet.  There is no incontinence and no bit lip or tongue.  He does admit to drinking a pint of liquor tonight but denies drug use tonight.  He does use marijuana but states his last use of marijuana was 3 days ago.  He has no complaints currently.   Prior to Admission medications   Medication Sig Start Date End Date Taking? Authorizing Provider  acetaminophen  (TYLENOL ) 500 MG tablet Take 1,000 mg by mouth 2 (two) times daily as needed for moderate pain (pain score 4-6), fever or headache.    [provider]  folic acid  (FOLVITE ) 1 MG tablet Take 1 tablet (1 mg total) by mouth daily. 04/19/24   Tobie Yetta HERO, MD  pantoprazole  (PROTONIX ) 40 MG tablet Take 1 tablet (40 mg total) by mouth 2 (two) times daily before a meal. 04/19/24 06/14/24  Tobie Yetta HERO, MD  propranolol  (INDERAL ) 10 MG tablet Take 1 tablet (10 mg total) by mouth every 6 (six) hours as needed for up to 15 days (anxiety). 03/19/24 06/15/24  Cleotilde Hoy HERO, NP  sertraline  (ZOLOFT ) 50 MG tablet Take 1 tablet (50 mg total) by mouth daily. Patient not taking: Reported on 04/18/2024 04/04/24   Lenard Calin, MD  thiamine  (VITAMIN B1) 100 MG  tablet Take 1 tablet (100 mg total) by mouth daily. 04/19/24   Tobie Yetta HERO, MD    Allergies: Benadryl [diphenhydramine hcl] and Reglan  [metoclopramide ]    Review of Systems  Unable to perform ROS: Mental status change    Updated Vital Signs BP (!) 135/101 (BP Location: Right Arm)   Pulse (!) 106   Temp 98.1 F (36.7 C) (Oral)   Resp 20   SpO2 95%   Physical Exam Vitals and nursing note reviewed.   33 year old male, resting comfortably and in no acute distress.  He is somnolent but easily arousable.  There is a strong odor of ethanol in his breath.  Vital signs are significant for borderline elevated heart rate, mildly elevated diastolic blood pressure. Oxygen saturation is 95%, which is normal. Head is normocephalic and atraumatic. PERRLA, EOMI.  Neck is nontender. Lungs are clear without rales, wheezes, or rhonchi. Chest is nontender. Heart has regular rate and rhythm without murmur. Abdomen is soft, flat, nontender. Extremities have no cyanosis or edema, full range of motion is present. Skin is warm and dry without rash. Neurologic: Somnolent but easily arousable, when aroused he is oriented x 3.  Cranial nerves are intact.  Strength is 5/5 in all 4 extremities.  There is no tremor.  (all labs ordered are listed, but only abnormal results  are displayed) Labs Reviewed  COMPREHENSIVE METABOLIC PANEL WITH GFR - Abnormal; Notable for the following components:      Result Value   CO2 19 (*)    Calcium  8.7 (*)    AST 56 (*)    Anion gap 17 (*)    All other components within normal limits  ETHANOL - Abnormal; Notable for the following components:   Alcohol, Ethyl (B) 370 (*)    All other components within normal limits  RAPID URINE DRUG SCREEN, HOSP PERFORMED - Abnormal; Notable for the following components:   Benzodiazepines POSITIVE (*)    Tetrahydrocannabinol POSITIVE (*)    All other components within normal limits  CK - Abnormal; Notable for the following components:    Total CK 432 (*)    All other components within normal limits  I-STAT CG4 LACTIC ACID, ED - Abnormal; Notable for the following components:   Lactic Acid, Venous 4.2 (*)    All other components within normal limits  I-STAT CG4 LACTIC ACID, ED - Abnormal; Notable for the following components:   Lactic Acid, Venous 3.4 (*)    All other components within normal limits  CBC WITH DIFFERENTIAL/PLATELET    EKG: EKG Interpretation Date/Time:  Friday April 26 2024 01:16:19 EDT Ventricular Rate:  99 PR Interval:  154 QRS Duration:  86 QT Interval:  368 QTC Calculation: 472 R Axis:   0  Text Interpretation: Normal sinus rhythm Septal infarct , age undetermined Abnormal ECG When compared with ECG of 18-Apr-2024 00:16, No significant change was found Confirmed by Raford Lenis ([id number]) on 04/26/2024 1:20:50 AM  Radiology: CT Head Wo Contrast Result Date: 04/26/2024 EXAM: CT HEAD WITHOUT CONTRAST 04/26/2024 01:52:48 AM TECHNIQUE: CT of the head was performed without the administration of intravenous contrast. Automated exposure control, iterative reconstruction, and/or weight based adjustment of the mA/kV was utilized to reduce the radiation dose to as low as reasonably achievable. COMPARISON: CT head #223 CLINICAL HISTORY: Mental status change, unknown cause. AMS FINDINGS: BRAIN AND VENTRICLES: No acute hemorrhage. Gray-white differentiation is preserved. No hydrocephalus. No extra-axial collection. No mass effect or midline shift. ORBITS: No acute abnormality. SINUSES: No acute abnormality. SOFT TISSUES AND SKULL: No acute soft tissue abnormality. No skull fracture. IMPRESSION: 1. No acute intracranial abnormality. Electronically signed by: Norman Gatlin MD 04/26/2024 02:08 AM EDT RP Workstation: [id number]     Procedures   Medications Ordered in the ED  sodium chloride  0.9 % bolus 1,000 mL (0 mLs Intravenous Stopped 04/26/24 0436)  LORazepam  (ATIVAN ) tablet 2 mg (2 mg Oral Given 04/26/24 0324)                                     Medical Decision Making Amount and/or Complexity of Data Reviewed Labs: ordered. Radiology: ordered.  Risk Prescription drug management.   Altered mental status which is slowly resolving.  This is a presentation with a wide range of treatment options and carries with it a high risk for morbidity and complications.  Differential diagnosis includes, but is not limited to, alcohol intoxication, seizure, other drug intoxication, metabolic encephalopathy.  I have reviewed his past records, and he was admitted on 04/18/2024 with hematemesis secondary to GERD with esophagitis, admitted on 03/12/2024 with alcohol use disorder.  I have reviewed his electrocardiogram, my interpretation is normal sinus rhythm without acute changes and unchanged from prior.  I have ordered laboratory workup and CT of head.  CT of head shows no acute intracranial abnormality.  I have reviewed his laboratory test, my interpretation is normal CBC, mild elevation of AST likely secondary to ethanol abuse, elevated CK but only mildly elevated of uncertain significance, elevated lactic acid level which is downtrending and could be related to seizure, drug screen positive for benzodiazepines and tetrahydrocannabinol.  Patient started to get tremulous and I ordered a dose of oral lorazepam .  Per his mother is not quite back to his baseline mentation.  Patient is actually requesting referral to residential facility for alcohol abuse, but I have advised him that was not something that we could do from the emergency department.  I am signing his case out to Dr. Garrick.  If he is not showing signs of going into florid alcohol withdrawal, he will need to be discharged with a prescription for chlordiazepoxide  and outpatient resources for substance abuse.     Final diagnoses:  Alcohol intoxication in active alcoholic without complication (HCC)  Elevated lactic acid level  Elevated CK  Elevated AST  (SGOT)    ED Discharge Orders     None          Raford Lenis, MD 04/26/24 [phone]

## 2024-04-26 NOTE — ED Triage Notes (Addendum)
 Pt is coming in for alcohol abuse, he has been in and out of orientation, he states he was drinking vodka. Pt is coming from home and was with his mother there. No vomiting, and no some mild generalized abd pain. Pt has a significant Hx of ETOH abuse   Medic vitals   108bgl 130/86 105hr 94%ra 17rr

## 2024-04-26 NOTE — ED Notes (Signed)
 Istat Lactic repeated d/t daley in processing time.

## 2024-04-27 ENCOUNTER — Other Ambulatory Visit (HOSPITAL_COMMUNITY)
Admission: EM | Admit: 2024-04-27 | Discharge: 2024-05-01 | Disposition: A | Discharge status: Home / Self Care | Payer: 59 | Source: Intra-hospital | Attending: Psychiatry | Admitting: Psychiatry

## 2024-04-27 ENCOUNTER — Ambulatory Visit (HOSPITAL_COMMUNITY)
Admission: EM | Admit: 2024-04-27 | Discharge: 2024-04-27 | Disposition: A | Discharge status: Home / Self Care | Attending: Psychiatry | Admitting: Psychiatry

## 2024-04-27 DIAGNOSIS — F332 Major depressive disorder, recurrent severe without psychotic features: Secondary | ICD-10-CM | POA: Insufficient documentation

## 2024-04-27 DIAGNOSIS — F1220 Cannabis dependence, uncomplicated: Secondary | ICD-10-CM | POA: Diagnosis not present

## 2024-04-27 DIAGNOSIS — F909 Attention-deficit hyperactivity disorder, unspecified type: Secondary | ICD-10-CM | POA: Insufficient documentation

## 2024-04-27 DIAGNOSIS — F329 Major depressive disorder, single episode, unspecified: Secondary | ICD-10-CM | POA: Insufficient documentation

## 2024-04-27 DIAGNOSIS — F411 Generalized anxiety disorder: Secondary | ICD-10-CM | POA: Insufficient documentation

## 2024-04-27 DIAGNOSIS — R748 Abnormal levels of other serum enzymes: Secondary | ICD-10-CM | POA: Insufficient documentation

## 2024-04-27 DIAGNOSIS — F17200 Nicotine dependence, unspecified, uncomplicated: Secondary | ICD-10-CM | POA: Insufficient documentation

## 2024-04-27 DIAGNOSIS — F429 Obsessive-compulsive disorder, unspecified: Secondary | ICD-10-CM | POA: Insufficient documentation

## 2024-04-27 DIAGNOSIS — Y908 Blood alcohol level of 240 mg/100 ml or more: Secondary | ICD-10-CM | POA: Insufficient documentation

## 2024-04-27 DIAGNOSIS — F1090 Alcohol use, unspecified, uncomplicated: Secondary | ICD-10-CM | POA: Diagnosis present

## 2024-04-27 DIAGNOSIS — Z91199 Patient's noncompliance with other medical treatment and regimen due to unspecified reason: Secondary | ICD-10-CM | POA: Insufficient documentation

## 2024-04-27 DIAGNOSIS — I10 Essential (primary) hypertension: Secondary | ICD-10-CM | POA: Insufficient documentation

## 2024-04-27 DIAGNOSIS — R630 Anorexia: Secondary | ICD-10-CM | POA: Insufficient documentation

## 2024-04-27 DIAGNOSIS — F1010 Alcohol abuse, uncomplicated: Secondary | ICD-10-CM | POA: Insufficient documentation

## 2024-04-27 DIAGNOSIS — Z9151 Personal history of suicidal behavior: Secondary | ICD-10-CM | POA: Insufficient documentation

## 2024-04-27 DIAGNOSIS — F10129 Alcohol abuse with intoxication, unspecified: Secondary | ICD-10-CM | POA: Insufficient documentation

## 2024-04-27 DIAGNOSIS — F1911 Other psychoactive substance abuse, in remission: Secondary | ICD-10-CM | POA: Insufficient documentation

## 2024-04-27 DIAGNOSIS — Z91148 Patient's other noncompliance with medication regimen for other reason: Secondary | ICD-10-CM | POA: Insufficient documentation

## 2024-04-27 LAB — COMPREHENSIVE METABOLIC PANEL WITH GFR
ALT: 49 U/L — ABNORMAL HIGH (ref 0–44)
AST: 68 U/L — ABNORMAL HIGH (ref 15–41)
Albumin: 4.1 g/dL (ref 3.5–5.0)
Alkaline Phosphatase: 55 U/L (ref 38–126)
Anion gap: 20 — ABNORMAL HIGH (ref 5–15)
BUN: 8 mg/dL (ref 6–20)
CO2: 20 mmol/L — ABNORMAL LOW (ref 22–32)
Calcium: 9 mg/dL (ref 8.9–10.3)
Chloride: 99 mmol/L (ref 98–111)
Creatinine, Ser: 0.87 mg/dL (ref 0.61–1.24)
GFR, Estimated: >60 mL/min (ref >60)
Glucose, Bld: 72 mg/dL (ref 70–99)
Potassium: 3.9 mmol/L (ref 3.5–5.1)
Sodium: 139 mmol/L (ref 135–145)
Total Bilirubin: 1.6 mg/dL — ABNORMAL HIGH (ref 0.0–1.2)
Total Protein: 6.7 g/dL (ref 6.5–8.1)

## 2024-04-27 LAB — HEMOGLOBIN A1C
Hgb A1c MFr Bld: 4.7 % — ABNORMAL LOW (ref 4.8–5.6)
Mean Plasma Glucose: 88.19 mg/dL

## 2024-04-27 LAB — URINALYSIS, ROUTINE W REFLEX MICROSCOPIC
Bilirubin Urine: NEGATIVE
Glucose, UA: NEGATIVE mg/dL
Hgb urine dipstick: NEGATIVE
Ketones, ur: 5 mg/dL — AB
Leukocytes,Ua: NEGATIVE
Nitrite: NEGATIVE
Protein, ur: NEGATIVE mg/dL
Specific Gravity, Urine: 1.023 (ref 1.005–1.030)
pH: 5 (ref 5.0–8.0)

## 2024-04-27 LAB — CBC WITH DIFFERENTIAL/PLATELET
Abs Immature Granulocytes: 0.03 K/uL (ref 0.00–0.07)
Basophils Absolute: 0.1 K/uL (ref 0.0–0.1)
Basophils Relative: 1 %
Eosinophils Absolute: 0.1 K/uL (ref 0.0–0.5)
Eosinophils Relative: 2 %
HCT: 47.9 % (ref 39.0–52.0)
Hemoglobin: 16.5 g/dL (ref 13.0–17.0)
Immature Granulocytes: 1 %
Lymphocytes Relative: 27 %
Lymphs Abs: 1.5 K/uL (ref 0.7–4.0)
MCH: 32.2 pg (ref 26.0–34.0)
MCHC: 34.4 g/dL (ref 30.0–36.0)
MCV: 93.4 fL (ref 80.0–100.0)
Monocytes Absolute: 0.5 K/uL (ref 0.1–1.0)
Monocytes Relative: 10 %
Neutro Abs: 3.3 K/uL (ref 1.7–7.7)
Neutrophils Relative %: 59 %
Platelets: 224 K/uL (ref 150–400)
RBC: 5.13 MIL/uL (ref 4.22–5.81)
RDW: 13.8 % (ref 11.5–15.5)
WBC: 5.5 K/uL (ref 4.0–10.5)
nRBC: 0 % (ref 0.0–0.2)

## 2024-04-27 LAB — POCT URINE DRUG SCREEN - MANUAL ENTRY (I-SCREEN)
POC Amphetamine UR: NOT DETECTED
POC Buprenorphine (BUP): NOT DETECTED
POC Cocaine UR: NOT DETECTED
POC Marijuana UR: POSITIVE — AB
POC Methadone UR: NOT DETECTED
POC Methamphetamine UR: NOT DETECTED
POC Morphine: NOT DETECTED
POC Oxazepam (BZO): POSITIVE — AB
POC Oxycodone UR: NOT DETECTED
POC Secobarbital (BAR): NOT DETECTED

## 2024-04-27 LAB — RPR: RPR Ser Ql: NONREACTIVE

## 2024-04-27 LAB — MAGNESIUM: Magnesium: 1.7 mg/dL (ref 1.7–2.4)

## 2024-04-27 LAB — ETHANOL: Alcohol, Ethyl (B): 257 mg/dL — ABNORMAL HIGH (ref <15)

## 2024-04-27 MED ORDER — LORAZEPAM 1 MG PO TABS
1.0000 mg | ORAL_TABLET | Freq: Three times a day (TID) | ORAL | Status: AC
  Administered 2024-04-28 – 2024-04-29 (×3): 1 mg via ORAL
  Filled 2024-04-27 (×3): qty 1

## 2024-04-27 MED ORDER — LORAZEPAM 1 MG PO TABS
1.0000 mg | ORAL_TABLET | Freq: Two times a day (BID) | ORAL | Status: AC
  Administered 2024-04-29 – 2024-04-30 (×2): 1 mg via ORAL
  Filled 2024-04-27 (×2): qty 1

## 2024-04-27 MED ORDER — PROPRANOLOL HCL 10 MG PO TABS
10.0000 mg | ORAL_TABLET | Freq: Three times a day (TID) | ORAL | Status: DC
  Administered 2024-04-27 – 2024-05-01 (×11): 10 mg via ORAL
  Filled 2024-04-27: qty 21
  Filled 2024-04-27 (×11): qty 1

## 2024-04-27 MED ORDER — MAGNESIUM HYDROXIDE 400 MG/5ML PO SUSP: 30.0000 mL | Freq: Every day | ORAL | Status: DC | PRN

## 2024-04-27 MED ORDER — OLANZAPINE 5 MG PO TBDP: 5.0000 mg | ORAL_TABLET | Freq: Three times a day (TID) | ORAL | Status: DC | PRN

## 2024-04-27 MED ORDER — OLANZAPINE 10 MG IM SOLR: 5.0000 mg | Freq: Three times a day (TID) | INTRAMUSCULAR | Status: DC | PRN

## 2024-04-27 MED ORDER — ONDANSETRON 4 MG PO TBDP: 4.0000 mg | ORAL_TABLET | Freq: Four times a day (QID) | ORAL | Status: AC | PRN

## 2024-04-27 MED ORDER — LORAZEPAM 1 MG PO TABS
1.0000 mg | ORAL_TABLET | Freq: Four times a day (QID) | ORAL | Status: AC
  Administered 2024-04-27 – 2024-04-28 (×4): 1 mg via ORAL
  Filled 2024-04-27 (×5): qty 1

## 2024-04-27 MED ORDER — ALUM & MAG HYDROXIDE-SIMETH 200-200-20 MG/5ML PO SUSP: 30.0000 mL | ORAL | Status: DC | PRN

## 2024-04-27 MED ORDER — HYDROXYZINE HCL 25 MG PO TABS
25.0000 mg | ORAL_TABLET | Freq: Four times a day (QID) | ORAL | Status: DC | PRN
  Administered 2024-04-27: 25 mg via ORAL
  Filled 2024-04-27: qty 1

## 2024-04-27 MED ORDER — LORAZEPAM 1 MG PO TABS
1.0000 mg | ORAL_TABLET | Freq: Every day | ORAL | Status: AC
  Administered 2024-05-01: 1 mg via ORAL
  Filled 2024-04-27: qty 1

## 2024-04-27 MED ORDER — THIAMINE HCL 100 MG/ML IJ SOLN
100.0000 mg | Freq: Once | INTRAMUSCULAR | Status: AC
  Administered 2024-04-27: 100 mg via INTRAMUSCULAR
  Filled 2024-04-27: qty 2

## 2024-04-27 MED ORDER — THIAMINE MONONITRATE 100 MG PO TABS
100.0000 mg | ORAL_TABLET | Freq: Every day | ORAL | Status: DC
  Administered 2024-04-28 – 2024-05-01 (×4): 100 mg via ORAL
  Filled 2024-04-27 (×4): qty 1

## 2024-04-27 MED ORDER — CLONIDINE HCL 0.1 MG PO TABS
0.1000 mg | ORAL_TABLET | Freq: Once | ORAL | Status: AC
  Administered 2024-04-27: 0.1 mg via ORAL
  Filled 2024-04-27: qty 1

## 2024-04-27 MED ORDER — ADULT MULTIVITAMIN W/MINERALS CH
1.0000 | ORAL_TABLET | Freq: Every day | ORAL | Status: DC
  Administered 2024-04-27 – 2024-05-01 (×5): 1 via ORAL
  Filled 2024-04-27 (×5): qty 1

## 2024-04-27 MED ORDER — LORAZEPAM 1 MG PO TABS: 1.0000 mg | ORAL_TABLET | Freq: Four times a day (QID) | ORAL | Status: DC | PRN

## 2024-04-27 MED ORDER — OLANZAPINE 10 MG IM SOLR: 10.0000 mg | Freq: Three times a day (TID) | INTRAMUSCULAR | Status: DC | PRN

## 2024-04-27 MED ORDER — PANTOPRAZOLE SODIUM 20 MG PO TBEC
20.0000 mg | DELAYED_RELEASE_TABLET | Freq: Every day | ORAL | Status: DC
  Administered 2024-04-27 – 2024-05-01 (×5): 20 mg via ORAL
  Filled 2024-04-27: qty 1
  Filled 2024-04-27: qty 7
  Filled 2024-04-27 (×4): qty 1

## 2024-04-27 MED ORDER — LOPERAMIDE HCL 2 MG PO CAPS: 2.0000 mg | ORAL_CAPSULE | ORAL | Status: AC | PRN

## 2024-04-27 MED ORDER — PROPRANOLOL HCL 10 MG PO TABS
10.0000 mg | ORAL_TABLET | Freq: Four times a day (QID) | ORAL | Status: DC | PRN
  Administered 2024-04-27: 10 mg via ORAL
  Filled 2024-04-27: qty 1

## 2024-04-27 NOTE — ED Notes (Signed)
 Pt continues to seem significantly anxious, pt says it has mildly improved since administration of inderall. RN discussed atarax  prn if desired.

## 2024-04-27 NOTE — ED Notes (Signed)
 Patient is sleeping. Respirations equal and unlabored, skin warm and dry. No change in assessment or acuity. Routine safety checks conducted according to facility protocol.

## 2024-04-27 NOTE — ED Notes (Signed)
 Pt oriented to unit, questions denied. Pt denies current pain and discomfort. Pt denies current si hi and avh- verbal contract for safety provided. Pt reports hx of ADHD and gastritis and that BP usually is a little elevated, says he usually take propanol for bp and anxiety. Pt generally cooperative.

## 2024-04-27 NOTE — ED Notes (Signed)
 Pt was provided with lunch. Pt compliant with all scheduled medications. Pt currently resting in bed. Pt generally polite and cooperative.

## 2024-04-27 NOTE — ED Provider Notes (Signed)
 Behavioral Health Urgent Care Medical Screening Exam  Patient Name: Brett Sanford MRN: [id number] Date of Evaluation: 04/27/24 Chief Complaint:   Diagnosis:  Final diagnoses:  Alcohol abuse with intoxication (HCC)    History of Present illness: Brett Sanford is a 33 y.o. male.   Patient presents to [facility] voluntarily, unaccompanied, reporting that he needs help with his alcohol use problem. He presents with  increased anxiety, restlessness, helplessness  and sadness. He presents with visible tremors  and reports that his last drink was yesterday. Reports his alcohol of choice is vodka and drinks a fifth every day. He also drinks beer  and wine occasionally. BAL 370 today at 0311 AM. Patient presented to ED  yesterday after an episode of altered consciousness.  Per ED note : His mother states that she had been having a normal conversation with him in the evening. She went to bed and thought he she heard him call out his name. She found him poorly responsive. He would wake up and then go back to sleep. Mentation is gradually improved. He is not back to his baseline yet. There is no incontinence and no bit lip or tongue. He does admit to drinking a pint of liquor tonight but denies drug use tonight. He does use marijuana but states his last use of marijuana was 3 days ago.  CT showed no acute intracranial abnormality. Mild elevation of AST, mild elevated CK. UDS positive for benzos and THC. Patient was requesting to be sent to a residential treatment facility but was recommended to come here for additional evaluations. Patient eloped  from ED.  Today he presents to [facility] reporting that I realized I need help to get away from alcohol.  Patient presents with a hx of ADHD, MDD, GAD in addition to alcohol abuse. Has not taken any ADH medications in a long time. Started drinking at age 45 but alcohol became a problem around age 63  because of life itself.  He reports that he was once in a residential  treatment program but was kicked out before completion due to a negative comment he made toward a staff member but I am not a violent person, you can look in my records.  Reports a family hx of ADHD: his 3 siblings have ADHD. Reports siblings drink but they are able to control their alcohol intake.  Reports his mother was recently diagnosed with dementia. Father died of brain cancer, years ago. His outpatient medications include Wellbutrin , Propranolol , Pantoprazole , Folic Acid , but he has not been taking any of those. Reports that getting back on ADHD medications would help him control his impulses.  Reports no hx of abuse or neglect. Currently lives with his other who is supportive but emotionally degrading.   Per chart review: Patient has had multiple visits to ED for about the same issue. On 04/03/2024, he was discharged from [facility] after completing detox and was recommended to follow up in outpatient services. Patient reports he failed to follow up and resumed using alcohol. His recommended medications were Propranolol , Zoloft  and Trazodone .   Assessment: Patient is evaluated face-to-face by this provider on 04/27/2024. He is cooperative upon approach. He appears disheveled with diminished hygiene, anxious, restless and helpless. Alert and oriented x 4. Does not appear to be preoccupied or responding to internal stimuli: remains focused on the topic, which is alcohol problem. His speech is pressured. His eye contact is fair. His thought process is coherent and goal-directed. Patient has no aggressive behaviors. Appears  sad and depressed. Expresses remorse and guilt but expressing motivation for self- improvement stating I can't tell any good decision I make with alcohol use, that's something I need to get away from. He reports that he drinks vodoka every day, at least a fith, and drinks beer and wine occasionally. He also use THC, last use being 4 days ago. Patient admits to not following up in outpatient  services and not taking his medications: stopped taking Zoloft  because it causes sexual dysfunction. Prefers to resume taking Wellbutrin  because I really never had any problem with it. Reports no hx of Seizures but some sort of unconsciousness. He denies SI/HI/AVH stating not this time.  Patent reports he was taken off work schedule 3 months ago by his supervisor who advised him to get help he said I was a liability, he wants me to get better, he saw high potential in me, but the alcohol is hindering me.  He reports no major triggers its my addiction.  Reports no medical acuity. Reports that his appetite is good and his sleep quality depends on alcohol.   Patient presents with  withdrawal symptoms including,  tremors, anxiety, restlessness and helplessness. Last use was yesterday and he states I withdraw quickly. His goal is to complete medical alcohol detox and continue with a residential treatment program. We will admit him to [facility] and initiate detox protocol. Dr Cole consulted and agrees with this recommendation.              Flowsheet Row ED from 04/27/2024 in [facility] ED from 04/26/2024 in [facility] Emergency Department at [facility] ED to Hosp-Admission (Discharged) from 04/18/2024 in [facility]  C-SSRS RISK CATEGORY Error: Question 2 not populated Error: Q3, 4, or 5 should not be populated when Q2 is No No Risk    Psychiatric Specialty Exam  Presentation  General Appearance:Disheveled  Eye Contact:Fair  Speech:Clear and Coherent  Speech Volume:Normal  Handedness:Right   Mood and Affect  Mood: Anxious; Depressed; Hopeless  Affect: Blunt; Depressed   Thought Process  Thought Processes: Coherent  Descriptions of Associations:Intact  Orientation:Full (Time, Place and Person)  Thought Content:Logical  Diagnosis of Schizophrenia or Schizoaffective disorder in past: No    Hallucinations:None  Ideas of Reference:None  Suicidal Thoughts:No With Intent; With Plan; With Means to Carry Out Without Intent  Homicidal Thoughts:No   Sensorium  Memory: Immediate Fair; Recent Fair; Remote Fair  Judgment: Fair  Insight: Fair   Chartered certified accountant: Fair  Attention Span: Fair  Recall: [employer] of Knowledge: Fair  Language: Fair   Psychomotor Activity  Psychomotor Activity: Restlessness   Assets  Assets: Manufacturing systems engineer; Desire for Improvement; Physical Health   Sleep  Sleep: Fair  Number of hours:  6   Physical Exam: Physical Exam Vitals and nursing note reviewed.  HENT:     Head: Normocephalic and atraumatic.     Right Ear: Tympanic membrane normal.     Left Ear: Tympanic membrane normal.     Nose: Nose normal.  Eyes:     Extraocular Movements: Extraocular movements intact.     Pupils: Pupils are equal, round, and reactive to light.  Pulmonary:     Effort: Pulmonary effort is normal.  Musculoskeletal:        General: Normal range of motion.     Cervical back: Normal range of motion and neck supple.  Neurological:     General: No focal  deficit present.     Mental Status: He is alert and oriented to person, place, and time.    Review of Systems  Constitutional: Negative.   HENT: Negative.    Eyes: Negative.   Respiratory: Negative.    Cardiovascular: Negative.   Gastrointestinal: Negative.   Musculoskeletal: Negative.   Skin: Negative.   Neurological: Negative.   Endo/Heme/Allergies: Negative.   Psychiatric/Behavioral:  Positive for depression and substance abuse. The patient is nervous/anxious.    Blood pressure (!) 161/109, pulse (!) 101, temperature 98.7 F (37.1 C), temperature source Oral, resp. rate 19, SpO2 99%. There is no height or weight on file to calculate BMI.  Musculoskeletal: Strength & Muscle Tone: within normal limits Gait & Station: normal Patient leans:  N/A   [facility] MSE Discharge Disposition for Follow up and Recommendations: Patient meets criteria for substance abuse treatment. We will admit him to [facility].    Randall Bouquet, NP 04/27/2024, 9:14 AM

## 2024-04-27 NOTE — BH Assessment (Signed)
 Comprehensive Clinical Assessment (CCA) Note  04/27/2024 Brett Sanford [id number]  Disposition: Per Starlyn Patron, NP, patient is recommended for [facility]  The patient demonstrates the following risk factors for suicide: Chronic risk factors for suicide include: substance use disorder, previous suicide attempts accidental od 2 weeks ago, and previous self-harm hx of burning himself. Acute risk factors for suicide include: family or marital conflict and loss (financial, interpersonal, professional). Protective factors for this patient include: hope for the future. Considering these factors, the overall suicide risk at this point appears to be high. Patient is not appropriate for outpatient follow up.   Per triage note Pt presents to [facility] unaccompanied looking to detox. Pt mentions that he is no longer suicidal and is actively drinking vodka daily. Pt states he is drinking a 5th everyday. Pt has been struggling with drinking for several years, he states. Pt reports that he has the shakes and is afraid that he will start to have withdrawls. Pts last drink was last night. Pt is taking his medication as prescribed. Pt denies drug use, Si, HI and AVH.  Brett Sanford is a 33 year old male who presents voluntarily to [facility] reporting the need for assistance with his substance use problem, primarily alcohol.  He states he has been sober in the past but has recently relapsed and is seeking help. Although he denies having any SI. HI, or AVH, he reports symptoms of lack of motivation, hopelessness, worthlessness and some sadness.   Patient endorses that he drinks up to a 1/5 of liquor daily as well as smokes marijuana 3-4 times per week.   Patient reports that he sometimes engages in NSSIB such as burning himself.  While ha denies SI at the time of assessment, he acknwledges ps suicide attempts.  He states that he had an accidental overdose bout 2 weeks ago.  He states he began drinking alcohol at the age of  14.but it did not become a problem for him until age 42.Patient reports symptom of withdrawal  to include, tremors, shakes and nausea.    patient is dressed in scrubs,  alert, and oriented x4. Patient's speech is clear and coherent with normal tone and volume. The patient's eye contact is good. Patient's mood is anxious, depressed and hopeless with depressed affect.. The patient's thought process is coherent and relevant. There is no indication that the patient is currently responding to internal stimuli or experiencing delusional thought content.  Patient was cooperative throughout the assessment.     Chief Complaint: Alcohol problem  Visit Diagnosis: Alcohol abuse with intoxication (HCC)    CCA Screening, Triage and Referral (STR)  Patient Reported Information How did you hear about us ? Self  What Is the Reason for Your Visit/Call Today? Pt presents to [facility] unaccompanied looking to detox. Pt mentions that he is no longer suicidal and is actively drinking vodka daily. Pt states he is drinking a 5th everyday. Pt has been struggling with drinking for several years, he states. Pt reports that he has the shakes and is afraid that he will start to have withdrawls. Pts last drink was last night. Pt is taking his medication as prescribed. Pt denies drug use, Si, HI and AVH.  How Long Has This Been Causing You Problems? <Week  What Do You Feel Would Help You the Most Today? Alcohol or Drug Use Treatment   Have You Recently Had Any Thoughts About Hurting Yourself? No  Are You Planning to Commit Suicide/Harm Yourself At This time? No   Flowsheet  Row ED from 04/27/2024 in [facility] Most recent reading at 04/27/2024 10:15 AM ED from 04/27/2024 in [facility] Most recent reading at 04/27/2024  9:13 AM ED from 04/26/2024 in [facility] Emergency Department at [facility] Most recent reading at 04/26/2024  1:09 AM  C-SSRS RISK CATEGORY  High Risk Error: Question 2 not populated Error: Q3, 4, or 5 should not be populated when Q2 is No    Have you Recently Had Thoughts About Hurting Someone [place]? No  Are You Planning to Harm Someone at This Time? No  Explanation: Pt denies   Have You Used Any Alcohol or Drugs in the Past 24 Hours? Yes  How Long Ago Did You Use Drugs or Alcohol? earlier last night  What Did You Use and How Much? last night vodka   Do You Currently Have a Therapist/Psychiatrist? No  Name of Therapist/Psychiatrist:    Have You Been Recently Discharged From Any Office Practice or Programs? No  Explanation of Discharge From Practice/Program: N/A     CCA Screening Triage Referral Assessment Type of Contact: Face-to-Face  Telemedicine Service Delivery:   Is this Initial or Reassessment?   Date Telepsych consult ordered in [facility]:    Time Telepsych consult ordered in [facility]:    Location of Assessment: [facility] Assessment Services  Provider Location: [facility] Assessment Services   Collateral Involvement: none   Does Patient Have a Automotive engineer Guardian? -- (N/A)  Legal Guardian Contact Information: NA  Copy of Legal Guardianship Form: -- (N/A)  Legal Guardian Notified of Arrival: -- (N/A)  Legal Guardian Notified of Pending Discharge: -- (N/A)  If Minor and Not Living with Parent(s), Who has Custody? N/A  Is CPS involved or ever been involved? Never  Is APS involved or ever been involved? Never   Patient Determined To Be At Risk for Harm To Self or Others Based on Review of Patient Reported Information or Presenting Complaint? No  Method: No Plan  Availability of Means: No access or NA  Intent: Vague intent or NA  Notification Required: No need or identified person (N/A)  Additional Information for Danger to Others Potential: -- (N/A)  Additional Comments for Danger to Others Potential: N/A  Are There Guns or Other Weapons in Your Home? No  Types of Guns/Weapons:  N/A  Are These Weapons Safely Secured?                            -- (N/A)  Who Could Verify You Are Able To Have These Secured: N/A  Do You Have any Outstanding Charges, Pending Court Dates, Parole/Probation? P denies  Contacted To Inform of Risk of Harm To Self or Others: Other: Comment (N/A)    Does Patient Present under Involuntary Commitment? No    [place] of Residence: [place]   Patient Currently Receiving the Following Services: Medication Management   Determination of Need: Urgent (48 hours)   Options For Referral: Facility-Based Crisis     CCA Biopsychosocial Patient Reported Schizophrenia/Schizoaffective Diagnosis in Past: No   Strengths: self-awareness in the need to get help   Mental Health Symptoms Depression:  Hopelessness; Worthlessness; Change in energy/activity   Duration of Depressive symptoms: Duration of Depressive Symptoms: Greater than two weeks   Mania:  None   Anxiety:   Worrying; Tension; Fatigue; Difficulty concentrating   Psychosis:  None   Duration of Psychotic symptoms:  Trauma:  None   Obsessions:  None   Compulsions:  None   Inattention:  None   Hyperactivity/Impulsivity:  None   Oppositional/Defiant Behaviors:  None   Emotional Irregularity:  None   Other Mood/Personality Symptoms:  n/a    Mental Status Exam Appearance and self-care  Stature:  Average   Weight:  Average weight   Clothing:  Casual   Grooming:  Normal   Cosmetic use:  None   Posture/gait:  Normal   Motor activity:  Not Remarkable   Sensorium  Attention:  Normal   Concentration:  Normal   Orientation:  X5   Recall/memory:  Normal   Affect and Mood  Affect:  Depressed   Mood:  Depressed   Relating  Eye contact:  Normal   Facial expression:  Depressed; Responsive   Attitude toward examiner:  Cooperative   Thought and Language  Speech flow: Clear and Coherent   Thought content:  Appropriate to Mood and Circumstances    Preoccupation:  None   Hallucinations:  None   Organization:  Coherent   [employer] of Knowledge:  Average   Intelligence:  Average   Abstraction:  Normal   Judgement:  Normal   Reality Testing:  Adequate   Insight:  Fair   Decision Making:  Impulsive   Social Functioning  Social Maturity:  Impulsive; Irresponsible   Social Judgement:  Normal   Stress  Stressors:  Family conflict; Financial; Work   Coping Ability:  Overwhelmed; Deficient supports   Skill Deficits:  Self-control; Decision making   Supports:  Family; Support needed     Religion: Religion/Spirituality Are You A Religious Person?: Yes What is Your Religious Affiliation?: Episcopalian How Might This Affect Treatment?: N/A  Leisure/Recreation: Leisure / Recreation Do You Have Hobbies?: Yes Leisure and Hobbies: Outdoors activties: hiking, fishing, hunting.  Exercise/Diet: Exercise/Diet Do You Exercise?: No Have You Gained or Lost A Significant Amount of Weight in the Past Six Months?: No Do You Have Any Trouble Sleeping?: No Explanation of Sleeping Difficulties: None reported   CCA Employment/Education Employment/Work Situation: Employment / Work Situation Employment Situation: Unemployed Work Stressors: pt is currently off the schedule and he does not know if he sill is employed or not. Patient's Job has Been Impacted by Current Illness: No Has Patient ever Been in the [employer]?: No  Education: Education Is Patient Currently Attending School?: No Last Grade Completed: 12 Did You Attend College?: No Did You Have Any Difficulty At School?: Yes Were Any Medications Ever Prescribed For These Difficulties?: No Patient's Education Has Been Impacted by Current Illness: No   CCA Family/Childhood History Family and Relationship History: Family history Does patient have children?: No  Childhood History:  Childhood History By whom was/is the patient raised?: Both  parents, Mother (both parents until dad passed away at and early age) Did patient suffer any verbal/emotional/physical/sexual abuse as a child?: No Did patient suffer from severe childhood neglect?: No Has patient ever been sexually abused/assaulted/raped as an adolescent or adult?: No Was the patient ever a victim of a crime or a disaster?: No Witnessed domestic violence?: No Has patient been affected by domestic violence as an adult?: No       CCA Substance Use Alcohol/Drug Use: Alcohol / Drug Use Pain Medications: See MAR Prescriptions: See MAR Over the Counter: See MAR History of alcohol / drug use?: Yes Longest period of sobriety (when/how long): 2020-2022 Negative Consequences of Use: Financial, Work / School Withdrawal Symptoms: Agitation, Sweats,  Nausea / Vomiting, Tremors Substance #1 Name of Substance 1: Alcohol 1 - Age of First Use: 14 1 - Amount (size/oz): up to 1/5 1 - Frequency: daily 1 - Duration: ongoing since 2022 1 - Last Use / Amount: las night/ amount unknown 1 - Method of Aquiring: purchase 1- Route of Use: oral Substance #2 Name of Substance 2: marijuna 2 - Age of First Use: 14 2 - Amount (size/oz): amount varies 2 - Frequency: 3-4 times per week 2 - Duration: ongoing 2 - Last Use / Amount: 4 days ago/amount unknown 2 - Method of Aquiring: purchase from street 2 - Route of Substance Use: smoking                     ASAM's:  Six Dimensions of Multidimensional Assessment  Dimension 1:  Acute Intoxication and/or Withdrawal Potential:   Dimension 1:  Description of individual's past and current experiences of substance use and withdrawal: He reports history of ETOH, THC, Meth and Xanax. He reports history of DT's, nausea, vomiting, sweating, and shaking when withdrawing from substances. Patient currently reports using alcohol daily for the past 1 month, 1 pint to a 1/5 of liquor and marijuana 3-4 ties per week  Dimension 2:  Biomedical  Conditions and Complications:   Dimension 2:  Description of patient's biomedical conditions and  complications: Gastritis, and history of DT's.  Dimension 3:  Emotional, Behavioral, or Cognitive Conditions and Complications:  Dimension 3:  Description of emotional, behavioral, or cognitive conditions and complications: P has has previous suicidal atempts s well as engages in NSSIB.  He repors hat he sometimeswill burn himself.  Dimension 4:  Readiness to Change:  Dimension 4:  Description of Readiness to Change criteria: Pt desire to get help  Dimension 5:  Relapse, Continued use, or Continued Problem Potential:  Dimension 5:  Relapse, continued use, or continued problem potential critiera description: History of sobriety from 2020-2022 but has history of often relasping  Dimension 6:  Recovery/Living Environment:  Dimension 6:  Recovery/Iiving environment criteria description: He lives with his mother and she is supportive.  ASAM Severity Score: ASAM's Severity Rating Score: 7  ASAM Recommended Level of Treatment: ASAM Recommended Level of Treatment: Level II Intensive Outpatient Treatment   Substance use Disorder (SUD) Substance Use Disorder (SUD)  Checklist Symptoms of Substance Use: Continued use despite having a persistent/recurrent physical/psychological problem caused/exacerbated by use, Continued use despite persistent or recurrent social, interpersonal problems, caused or exacerbated by use, Large amounts of time spent to obtain, use or recover from the substance(s), Persistent desire or unsuccessful efforts to cut down or control use, Presence of craving or strong urge to use  Recommendations for Services/Supports/Treatments: Recommendations for Services/Supports/Treatments Recommendations For Services/Supports/Treatments: Inpatient Hospitalization, Medication Management, Individual Therapy  Disposition Recommendation per psychiatric provider: We recommend inpatient psychiatric  hospitalization when medically cleared. Patient is under voluntary admission status at this time; please IVC if attempts to leave hospital.   DSM5 Diagnoses: Patient Active Problem List   Diagnosis Date Noted   Alcohol use disorder 04/27/2024   Gastroesophageal reflux disease with esophagitis and hemorrhage 04/19/2024   Gastritis without bleeding 04/19/2024   Hematemesis 04/18/2024   Syncope 04/18/2024   LFT elevation 04/18/2024   Alcohol abuse 04/02/2024   Substance abuse (HCC) 04/01/2024   Alcohol use disorder, severe, dependence (HCC) 03/12/2024   MDD (major depressive disorder), recurrent episode, severe (HCC) 08/02/2023   Drug overdose, intentional self-harm, initial encounter (HCC) 10/07/2019   Alcoholic  intoxication without complication (HCC)    Suicide attempt (HCC)    Viral illness 08/21/2019   Hypokalemia 06/23/2019   Attention deficit hyperactivity disorder (ADHD) 06/23/2019   Seizure-like activity (HCC) 06/23/2019   History of alcohol abuse 06/23/2019   Chronic migraine 06/23/2019   Diarrhea 06/23/2019     Referrals to Alternative Service(s): Referred to Alternative Service(s):   Place:   Date:   Time:    Referred to Alternative Service(s):   Place:   Date:   Time:    Referred to Alternative Service(s):   Place:   Date:   Time:    Referred to Alternative Service(s):   Place:   Date:   Time:     Lianne JINNY Shuck, LCSW

## 2024-04-27 NOTE — ED Notes (Addendum)
 Pt c/o anxiety. Pt administered prn propanolol as requested. Pt says he has a lot on his mind. Elevated pulse 113- provider notified.
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
## 2024-04-27 NOTE — Group Note (Signed)
 Group Topic: Emotional Regulation  Group Date: 04/27/2024 Start Time: 1710 End Time: 1740 Facilitators: Daved Tinnie HERO, RN  Department: [facility]  Number of Participants: 7  Group Focus: loss/grief issues Treatment Modality:  Psychoeducation Interventions utilized were exploration Purpose: increase insight  Name: Brett Sanford Date of Birth: 14-Feb-1991  MR: [id number]    Level of Participation: active Quality of Participation: attentive and cooperative Interactions with others: gave feedback Mood/Affect: appropriate Triggers (if applicable): n/a Cognition: coherent/clear Progress: Significant Response: pt says he will help navigate grieving by changing his daily habits Plan: patient will be encouraged to attend future RN education groups  Patients Problems:  Patient Active Problem List   Diagnosis Date Noted   Alcohol use disorder 04/27/2024   Gastroesophageal reflux disease with esophagitis and hemorrhage 04/19/2024   Gastritis without bleeding 04/19/2024   Hematemesis 04/18/2024   Syncope 04/18/2024   LFT elevation 04/18/2024   Alcohol abuse 04/02/2024   Substance abuse (HCC) 04/01/2024   Alcohol use disorder, severe, dependence (HCC) 03/12/2024   MDD (major depressive disorder), recurrent episode, severe (HCC) 08/02/2023   Drug overdose, intentional self-harm, initial encounter (HCC) 10/07/2019   Alcoholic intoxication without complication (HCC)    Suicide attempt (HCC)    Viral illness 08/21/2019   Hypokalemia 06/23/2019   Attention deficit hyperactivity disorder (ADHD) 06/23/2019   Seizure-like activity (HCC) 06/23/2019   History of alcohol abuse 06/23/2019   Chronic migraine 06/23/2019   Diarrhea 06/23/2019

## 2024-04-27 NOTE — Progress Notes (Signed)
 Pt is transferred to [facility]

## 2024-04-28 DIAGNOSIS — I10 Essential (primary) hypertension: Secondary | ICD-10-CM | POA: Diagnosis not present

## 2024-04-28 DIAGNOSIS — F332 Major depressive disorder, recurrent severe without psychotic features: Secondary | ICD-10-CM | POA: Diagnosis not present

## 2024-04-28 DIAGNOSIS — F1220 Cannabis dependence, uncomplicated: Secondary | ICD-10-CM | POA: Diagnosis not present

## 2024-04-28 DIAGNOSIS — F411 Generalized anxiety disorder: Secondary | ICD-10-CM | POA: Diagnosis not present

## 2024-04-28 MED ORDER — CLONIDINE HCL 0.1 MG PO TABS: 0.1000 mg | ORAL_TABLET | Freq: Two times a day (BID) | ORAL | Status: DC | PRN

## 2024-04-28 MED ORDER — HYDROXYZINE HCL 25 MG PO TABS
25.0000 mg | ORAL_TABLET | Freq: Three times a day (TID) | ORAL | Status: DC | PRN
  Filled 2024-04-28: qty 1

## 2024-04-28 MED ORDER — HYDROXYZINE HCL 25 MG PO TABS
50.0000 mg | ORAL_TABLET | Freq: Three times a day (TID) | ORAL | Status: DC | PRN
  Administered 2024-04-28 – 2024-04-30 (×4): 50 mg via ORAL
  Filled 2024-04-28 (×2): qty 2
  Filled 2024-04-28: qty 20
  Filled 2024-04-28: qty 2

## 2024-04-28 NOTE — Group Note (Signed)
 Group Topic: Understanding Self  Group Date: 04/28/2024 Start Time: 8069 End Time: 2000 Facilitators: Anice Benton LABOR, NT  Department: [facility]  Number of Participants: 4  Group Focus: self-awareness Treatment Modality:  Cognitive Behavioral Therapy Interventions utilized were mental fitness Purpose: explore maladaptive thinking  Name: Brett Sanford Date of Birth: 12-02-1990  MR: [id number]    Level of Participation: active Quality of Participation: cooperative Interactions with others: gave feedback Mood/Affect: appropriate Triggers (if applicable): N/A Cognition: coherent/clear Progress: Moderate Response: Good Plan: follow-up needed  Patients Problems:  Patient Active Problem List   Diagnosis Date Noted   Alcohol use disorder 04/27/2024   Gastroesophageal reflux disease with esophagitis and hemorrhage 04/19/2024   Gastritis without bleeding 04/19/2024   Hematemesis 04/18/2024   Syncope 04/18/2024   LFT elevation 04/18/2024   Alcohol abuse 04/02/2024   Substance abuse (HCC) 04/01/2024   Alcohol use disorder, severe, dependence (HCC) 03/12/2024   MDD (major depressive disorder), recurrent episode, severe (HCC) 08/02/2023   Drug overdose, intentional self-harm, initial encounter (HCC) 10/07/2019   Alcoholic intoxication without complication (HCC)    Suicide attempt (HCC)    Viral illness 08/21/2019   Hypokalemia 06/23/2019   Attention deficit hyperactivity disorder (ADHD) 06/23/2019   Seizure-like activity (HCC) 06/23/2019   History of alcohol abuse 06/23/2019   Chronic migraine 06/23/2019   Diarrhea 06/23/2019

## 2024-04-28 NOTE — ED Notes (Signed)
 Patient is sleeping. Respirations equal and unlabored, skin warm and dry. No change in assessment or acuity. Routine safety checks conducted according to facility protocol.

## 2024-04-28 NOTE — Group Note (Signed)
 Group Topic: Recovery Basics  Group Date: 04/28/2024 Start Time: 1205 End Time: 1240 Facilitators: Stanly Stabile, RN  Department: [facility]  Number of Participants: 7  Group Focus: chemical dependency education, chemical dependency issues, clarity of thought, communication, and coping skills Treatment Modality:  Behavior Modification Therapy Interventions utilized were clarification, exploration, patient education, and problem solving Purpose: enhance coping skills, explore maladaptive thinking, express feelings, express irrational fears, improve communication skills, increase insight, regain self-worth, reinforce self-care, and relapse prevention strategies  Name: Brett Sanford Date of Birth: [birth year]  MR: [id number]    Level of Participation: minimal Quality of Participation: attentive and cooperative Interactions with others: gave feedback Mood/Affect: appropriate Triggers (if applicable):   Cognition: coherent/clear Progress: Minimal Response:   Plan: follow-up needed  Patients Problems:  Patient Active Problem List   Diagnosis Date Noted   Alcohol use disorder 04/27/2024   Gastroesophageal reflux disease with esophagitis and hemorrhage 04/19/2024   Gastritis without bleeding 04/19/2024   Hematemesis 04/18/2024   Syncope 04/18/2024   LFT elevation 04/18/2024   Alcohol abuse 04/02/2024   Substance abuse (HCC) 04/01/2024   Alcohol use disorder, severe, dependence (HCC) 03/12/2024   MDD (major depressive disorder), recurrent episode, severe (HCC) 08/02/2023   Drug overdose, intentional self-harm, initial encounter (HCC) 10/07/2019   Alcoholic intoxication without complication (HCC)    Suicide attempt (HCC)    Viral illness 08/21/2019   Hypokalemia 06/23/2019   Attention deficit hyperactivity disorder (ADHD) 06/23/2019   Seizure-like activity (HCC) 06/23/2019   History of alcohol abuse 06/23/2019   Chronic migraine 06/23/2019    Diarrhea 06/23/2019

## 2024-04-28 NOTE — ED Provider Notes (Signed)
 Facility Based Crisis Admission H&P  Date: 04/28/24 Patient Name: Brett Sanford MRN: [id number] Chief Complaint: Worsening alcohol & THC dependency  Diagnoses:  Final diagnoses:  Alcohol use disorder  GAD (generalized anxiety disorder)  Severe episode of recurrent major depressive disorder, without psychotic features (HCC)   HPI: Brett Sanford is a 33 y.o. Caucasian male with a reported prior mental health history of MDD, ADHD, GAD & alcohol use d/o who presented to the [facility] on 06/28/24 seeking assistance with regaining his sobriety from alcohol. Pt reported during assessment that his last drink consisted of a pint of Vodka prior to presenting at this urgent care. Patient was [facility] to the [facility] at the [facility] with a goal of detoxification from alcohol, and possible rehabilitation placement thereafter.  Assessment & Review of Psychiatry symptoms: During encounter, pt reports alcohol use starting at approximately 16 yrs of age, and worsening when he was 33 years old, and a sustained use from 33 yrs old to this moment. Patient reports that he has been drinking heavily and steadily for the past 3 yrs. He reports that he is trying to regained sobriety due to his mother being recently diagnosed with dementia, and he wanting to be more assistive with taking care of her. Pt reports that he also uses THC, but it is not affecting me like that alcohol is. States that he smokes THC only a few times a week, unable to quantify how much, denies any other substance use. States that he uses alcohol only for pleasure, denies that it is for self medicating. Confirms his mental health diagnoses as listed above under HPI. Denies history of seizures related to alcohol use. Reports multiple blackouts related to alcohol abuse. Reports that he would like a 14 day rehab program so I can go take care of my mother.  Shares that if there is no such thing  as a 14 day program, he would like an intensive outpatient substance abuse treatment program instead.      During assessment, pt reports chills, tremors, and on objective assessment, has moderate amounts of tremors to b/l hands. He reports breaking out in cold sweats, reports generalized malaise, but states that overall, the Ativan  taper is helping.   Patient reports some depressive symptoms prior to presentation facility.  He reports guilt related to his drinking, reports a decreased energy level, poor appetite, memory clouding, anhedonia, denies any other depressive symptoms, but reports a history of being diagnosed with depression, for at least 1 year now.  Reports being on medications for depression as listed below.  Reports that he took himself off Zoloft  a few months ago due to the sexual side effects of this medication.  Reports that he has not been on Zoloft  for at least a few months now.  Patient also reports trouble concentrating, but states that it is related to his ADHD.  Reports symptoms significant for GAD; Restlessness, worrying excessively, muscle tension for at least a few months now, and states that he is also diagnosed with GAD.  He denies any history of panic disorder.   Denies any symptoms significant for the following mental health conditions: psychosis, PTSD, OCD; specifically denies any history of auditory, visual, tactile hallucinations in the past, recently, or currently. Denies first rank symptoms or a history of this, denies paranoia or delusional thinking in the past or currently.  Denies any history of emotional, physical or sexual trauma.   Current Outpatient (Home) Medication  List: States that he had been taking Zoloft  prior to this hospitalization, but it caused sexual side effets, states that he has also had prior trials of Effexor  and Fluoxetine  which also caused him to have sexual side effect. Tried gabapentin  in the past with some effectiveness, stopped it due to  running out of his insurance.   Collateral Information: States that he resides with his mother, but that mother has dementia and that he does not want mother being called for collateral information.  POA/Legal Guardian: Patient is his own LG.  Past Psychiatric Hx: Previous Psych Diagnoses: ADHD, MDD, GAD Prior inpatient treatment: was here at the [facility] on 04/02/2024 Current/prior outpatient treatment: Denies any current ones Prior rehab hx: Denies  Psychotherapy yk:Izwpzd  History of suicide attempt: Reports accidental overdose on Promethazine  a couple of months ago.  Reports that this is the reason why he was taken off work, because they thought I tried to kill myself.  Reports a suicide attempt via overdose on sleeping pills a few years back specifically 4 to 5 years ago, whereby his mother found him on the porch,and  he was hospitalized.  History of homicide or aggression: Denies any history of aggression towards people or animals.  Denies fire-setting behaviors. Psychiatric medication history: Medication trials as listed above. Psychiatric medication compliance history: Predominantly noncompliant. Neuromodulation history: Denies Current Psychiatrist: Does not have a current psychiatrist Current therapist: None reported.  Substance Abuse Hx: See under ROS above.  Past Medical History: Medical Diagnoses:GERD Home Rx:Protonix  40 mg daily at home  Prior Hosp: Per chart review, multiple, including [facility] and [facility] related to alcohll use d/o. Prior Surgeries/Trauma: Denies  Head trauma, LOC, concussions, seizures: Denies  Allergies:Denies, but states cannot tolerate Reglan  (gets jittery) LMP:n/a Contraception:denies  PCP:non at this time   Family History: Medical: Father is deceased, had brain CA. Mother with dementia.  Psych: Denies  Psych Rx: n/a SA/HA: Denies  Substance use family yk:Izwpzd   Social History: That he was born and raised in [place], [place].   Currently residing with his mother, has 2 siblings, who are older than him and reside elsewhere.  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  Finances is a current issue.  Another stressor is his alcohol consumption.  He is single, has no children, has highest level of education is 12th grade.  Support system his mother.  Abuse: Denies Sexual orientation: Heterosexual Children: None Employment: Housing: Legal: Denies Hotel manager:   Denies                                                                 Current Presentation: Pt with flat affect and depressed mood, attention to personal hygiene and grooming is fair, eye contact is good, speech is clear & coherent. Thought contents are organized and logical, and pt currently denies SI/HI/AVH or paranoia. There is no evidence of delusional thoughts.  Patient presents with no overt signs of psychosis, mild tremors observed to bilateral arms, he is tolerating detox protocol well, minimal signs of alcohol related withdrawal reported.  He denies any history of seizures related to alcohol withdrawals.  Suicide Risk Assessment: [place] Suicide Risk assessment:  1. Do you wish to be dead? Not recently. 2. Have you wished your  dead or wished you could go to sleep and not wake up?  NO 3.  Have you actually had thoughts of killing yourself?   Not recently. 4.  Have you been thinking about how you might do this?   NO 5.  Have you had these thoughts and some intention of acting on them?  NO 6.  Have you started to work out or worked out the details to kill yourself?  NO 7.  Do you intend to carry out this plan? NO 8. On a scale of 1-5 with 1 being the least severe and 5 being the most severe answer the following questions place for intensity of ideation. ZERO 9. How many times have you had these thoughts? NO 10. When you have the thoughts how long to the last?  NO 11. Control ability.  Could you or can you stop thinking about killing herself or  wanting to die if you want to?  YES 12. Are there any things anyone or anything family religion pain of death that stop you from wanting to die or acting on thoughts of committing suicide?  FAMILY 13.  What sort of reason to do have to think about wanting to die or killing yourself? NONE  14. Have you done anything, started to do anything,  or prepared to do anything to end your life? Examples: Took pills, tried to shoot yourself, cut yourself, tried to hang yourself,  took out pills but didn't swallow any, held a gun but changed your mind or it was  grabbed from your hand, went to the roof but didn't jump, collected pills, obtained  a gun, gave away valuables, wrote a will or suicide note, etc. Not currently, and not recently.  Suicide Risk: Minimal: No identifiable suicidal ideation.  Patients presenting with no risk factors but with morbid ruminations; may be classified as minimal risk based on the severity of the depressive symptoms.   PHQ 2-9:  Flowsheet Row ED from 04/27/2024 in [facility] Most recent reading at 04/28/2024  3:02 PM ED from 04/27/2024 in [facility] Most recent reading at 04/27/2024  9:13 AM ED from 04/02/2024 in [facility] Most recent reading at 04/03/2024  9:58 AM  Thoughts that you would be better off dead, or of hurting yourself in some way Several days Not at all Several days  PHQ-9 Total Score 9 7 11     Flowsheet Row ED from 04/27/2024 in [facility] Most recent reading at 04/27/2024 10:15 AM ED from 04/27/2024 in [facility] Most recent reading at 04/27/2024  9:13 AM ED from 04/26/2024 in [facility] Emergency Department at [facility] Most recent reading at 04/26/2024  1:09 AM  C-SSRS RISK CATEGORY High Risk Error: Question 2 not populated Error: Q3, 4, or 5 should not be populated when Q2 is No    Screenings     Flowsheet Row Most Recent Value  CIWA-Ar Total 6    Total Time spent with patient: 1.5 hours  Musculoskeletal  Strength & Muscle Tone: within normal limits Gait & Station: normal Patient leans: N/A  Psychiatric Specialty Exam  Presentation General Appearance:  Fairly Groomed  Eye Contact: Fair  Speech: Clear and Coherent  Speech Volume: Normal  Handedness: Right   Mood and Affect  Mood: Anxious; Depressed  Affect: Congruent   Thought Process  Thought Processes: Coherent  Descriptions of Associations:Intact  Orientation:Full (Time, Place and Person)  Thought Content:Logical; WDL  Diagnosis of Schizophrenia or Schizoaffective disorder in past: No   Hallucinations:Hallucinations: None  Ideas of Reference:None  Suicidal Thoughts:Suicidal Thoughts: No  Homicidal Thoughts:Homicidal Thoughts: No   Sensorium  Memory: Immediate Fair  Judgment: Fair  Insight: Fair   Art therapist  Concentration: Fair  Attention Span: Fair  Recall: Fair  Fund of Knowledge: Fair  Language: Fair   Psychomotor Activity  Psychomotor Activity: Psychomotor Activity: Normal   Assets  Assets: Resilience   Sleep  Sleep: Sleep: Fair   Nutritional Assessment (For OBS and FBC admissions only) Has the patient had a weight loss or gain of 10 pounds or more in the last 3 months?: No Has the patient had a decrease in food intake/or appetite?: Yes Does the patient have dental problems?: No Does the patient have eating habits or behaviors that may be indicators of an eating disorder including binging or inducing vomiting?: No Has the patient recently lost weight without trying?: 0 Has the patient been eating poorly because of a decreased appetite?: 0 Malnutrition Screening Tool Score: 0    Physical Exam Constitutional:      Appearance: Normal appearance.  Musculoskeletal:        General: Normal range of motion.     Cervical back: Normal  range of motion.  Neurological:     General: No focal deficit present.     Mental Status: He is alert and oriented to person, place, and time.    Review of Systems  Psychiatric/Behavioral:  Positive for depression and substance abuse. Negative for hallucinations, memory loss and suicidal ideas. The patient is nervous/anxious and has insomnia.   All other systems reviewed and are negative.   Blood pressure (!) 144/101, pulse 89, temperature 98.1 F (36.7 C), resp. rate 18, SpO2 96%. There is no height or weight on file to calculate BMI.  Is the patient at risk to self? Yes -With worsening alcohol consumption Has the patient been a risk to self in the past 6 months? Yes .    Has the patient been a risk to self within the distant past? No   Is the patient a risk to others? No   Has the patient been a risk to others in the past 6 months? No   Has the patient been a risk to others within the distant past? No   Past Medical History: GERD Last Labs:  Admission on 04/27/2024  Component Date Value Ref Range Status   Color, Urine 04/27/2024 AMBER (A)  YELLOW Final   BIOCHEMICALS MAY BE AFFECTED BY COLOR   APPearance 04/27/2024 CLEAR  CLEAR Final   Specific Gravity, Urine 04/27/2024 1.023  1.005 - 1.030 Final   pH 04/27/2024 5.0  5.0 - 8.0 Final   Glucose, UA 04/27/2024 NEGATIVE  NEGATIVE mg/dL Final   Hgb urine dipstick 04/27/2024 NEGATIVE  NEGATIVE Final   Bilirubin Urine 04/27/2024 NEGATIVE  NEGATIVE Final   Ketones, ur 04/27/2024 5 (A)  NEGATIVE mg/dL Final   Protein, ur 92/80/7974 NEGATIVE  NEGATIVE mg/dL Final   Nitrite 92/80/7974 NEGATIVE  NEGATIVE Final   Leukocytes,Ua 04/27/2024 NEGATIVE  NEGATIVE Final   Performed at [facility], [address] [place], [place] [address]   POC Amphetamine UR 04/27/2024 None Detected  NONE DETECTED (Cut Off Level 1000 ng/mL) Final   POC Secobarbital (BAR) 04/27/2024 None Detected  NONE DETECTED (Cut Off Level 300 ng/mL) Final   POC  Buprenorphine (BUP) 04/27/2024 None Detected  NONE DETECTED (Cut Off  Level 10 ng/mL) Final   POC Oxazepam (BZO) 04/27/2024 Positive (A)  NONE DETECTED (Cut Off Level 300 ng/mL) Final   POC Cocaine UR 04/27/2024 None Detected  NONE DETECTED (Cut Off Level 300 ng/mL) Final   POC Methamphetamine UR 04/27/2024 None Detected  NONE DETECTED (Cut Off Level 1000 ng/mL) Final   POC Morphine  04/27/2024 None Detected  NONE DETECTED (Cut Off Level 300 ng/mL) Final   POC Methadone UR 04/27/2024 None Detected  NONE DETECTED (Cut Off Level 300 ng/mL) Final   POC Oxycodone UR 04/27/2024 None Detected  NONE DETECTED (Cut Off Level 100 ng/mL) Final   POC Marijuana UR 04/27/2024 Positive (A)  NONE DETECTED (Cut Off Level 50 ng/mL) Final  Admission on 04/27/2024, Discharged on 04/27/2024  Component Date Value Ref Range Status   WBC 04/27/2024 5.5  4.0 - 10.5 K/uL Final   RBC 04/27/2024 5.13  4.22 - 5.81 MIL/uL Final   Hemoglobin 04/27/2024 16.5  13.0 - 17.0 g/dL Final   HCT 92/80/7974 47.9  39.0 - 52.0 % Final   MCV 04/27/2024 93.4  80.0 - 100.0 fL Final   MCH 04/27/2024 32.2  26.0 - 34.0 pg Final   MCHC 04/27/2024 34.4  30.0 - 36.0 g/dL Final   RDW 92/80/7974 13.8  11.5 - 15.5 % Final   Platelets 04/27/2024 224  150 - 400 K/uL Final   nRBC 04/27/2024 0.0  0.0 - 0.2 % Final   Neutrophils Relative % 04/27/2024 59  % Final   Neutro Abs 04/27/2024 3.3  1.7 - 7.7 K/uL Final   Lymphocytes Relative 04/27/2024 27  % Final   Lymphs Abs 04/27/2024 1.5  0.7 - 4.0 K/uL Final   Monocytes Relative 04/27/2024 10  % Final   Monocytes Absolute 04/27/2024 0.5  0.1 - 1.0 K/uL Final   Eosinophils Relative 04/27/2024 2  % Final   Eosinophils Absolute 04/27/2024 0.1  0.0 - 0.5 K/uL Final   Basophils Relative 04/27/2024 1  % Final   Basophils Absolute 04/27/2024 0.1  0.0 - 0.1 K/uL Final   Immature Granulocytes 04/27/2024 1  % Final   Abs Immature Granulocytes 04/27/2024 0.03  0.00 - 0.07 K/uL Final   Performed at [facility], [address] [place], [place] [address]   Sodium 04/27/2024 139  135 - 145 mmol/L Final   Potassium 04/27/2024 3.9  3.5 - 5.1 mmol/L Final   Chloride 04/27/2024 99  98 - 111 mmol/L Final   CO2 04/27/2024 20 (L)  22 - 32 mmol/L Final   Glucose, Bld 04/27/2024 72  70 - 99 mg/dL Final   Glucose reference range applies only to samples taken after fasting for at least 8 hours.   BUN 04/27/2024 8  6 - 20 mg/dL Final   Creatinine, Ser 04/27/2024 0.87  0.61 - 1.24 mg/dL Final   Calcium  04/27/2024 9.0  8.9 - 10.3 mg/dL Final   Total Protein 92/80/7974 6.7  6.5 - 8.1 g/dL Final   Albumin 92/80/7974 4.1  3.5 - 5.0 g/dL Final   AST 92/80/7974 68 (H)  15 - 41 U/L Final   ALT 04/27/2024 49 (H)  0 - 44 U/L Final   Alkaline Phosphatase 04/27/2024 55  38 - 126 U/L Final   Total Bilirubin 04/27/2024 1.6 (H)  0.0 - 1.2 mg/dL Final   GFR, Estimated 04/27/2024 >60  >60 mL/min Final   Comment: (NOTE) Calculated using the CKD-EPI Creatinine Equation (2021)    Anion gap 04/27/2024 20 (H)  5 - 15 Final   Performed at [facility], [address], [place], [place] [address]   Hgb A1c MFr Bld 04/27/2024 4.7 (L)  4.8 - 5.6 % Final   Comment: (NOTE) Diagnosis of Diabetes The following HbA1c ranges recommended by the American Diabetes Association (ADA) may be used as an aid in the diagnosis of diabetes mellitus.  Hemoglobin             Suggested A1C NGSP%              Diagnosis  <5.7                   Non Diabetic  5.7-6.4                Pre-Diabetic  >6.4                   Diabetic  <7.0                   Glycemic control for                       adults with diabetes.     Mean Plasma Glucose 04/27/2024 88.19  mg/dL Final   Performed at [facility], [address], [place], [place] [address]   Magnesium  04/27/2024 1.7  1.7 - 2.4 mg/dL Final   Performed at [facility], [address] [place], [place] [address]   Alcohol, Ethyl (B) 04/27/2024 257 (H)  <15 mg/dL Final    Comment: (NOTE) For medical purposes only. Performed at [facility], [address] [place], [place] [address]    RPR Ser Ql 04/27/2024 NON REACTIVE  NON REACTIVE Final   Performed at [facility], [address], [place], [place] [address]  Admission on 04/26/2024, Discharged on 04/26/2024  Component Date Value Ref Range Status   Sodium 04/26/2024 140  135 - 145 mmol/L Final   Potassium 04/26/2024 3.6  3.5 - 5.1 mmol/L Final   Chloride 04/26/2024 104  98 - 111 mmol/L Final   CO2 04/26/2024 19 (L)  22 - 32 mmol/L Final   Glucose, Bld 04/26/2024 87  70 - 99 mg/dL Final   Glucose reference range applies only to samples taken after fasting for at least 8 hours.   BUN 04/26/2024 12  6 - 20 mg/dL Final   Creatinine, Ser 04/26/2024 0.94  0.61 - 1.24 mg/dL Final   Calcium  04/26/2024 8.7 (L)  8.9 - 10.3 mg/dL Final   Total Protein 92/81/7974 6.6  6.5 - 8.1 g/dL Final   Albumin 92/81/7974 3.7  3.5 - 5.0 g/dL Final   AST 92/81/7974 56 (H)  15 - 41 U/L Final   ALT 04/26/2024 42  0 - 44 U/L Final   Alkaline Phosphatase 04/26/2024 55  38 - 126 U/L Final   Total Bilirubin 04/26/2024 0.9  0.0 - 1.2 mg/dL Final   GFR, Estimated 04/26/2024 >60  >60 mL/min Final   Comment: (NOTE) Calculated using the CKD-EPI Creatinine Equation (2021)    Anion gap 04/26/2024 17 (H)  5 - 15 Final   Performed at [facility], [address] [place], [place] [address]   Alcohol, Ethyl (B) 04/26/2024 370 (HH)  <15 mg/dL Final   Comment: CRITICAL RESULT CALLED TO, READ BACK BY AND VERIFIED WITH GASQUE, S. RN @0311  04/26/24 SATRAINR (NOTE) For medical purposes only. Performed at [facility], [address], [place], [place] [address]    WBC 04/26/2024 6.1  4.0 - 10.5 K/uL Final   RBC 04/26/2024 5.11  4.22 - 5.81 MIL/uL Final   Hemoglobin 04/26/2024 16.6  13.0 - 17.0 g/dL Final   HCT 92/81/7974 48.4  39.0 - 52.0 % Final   MCV 04/26/2024 94.7  80.0 - 100.0 fL Final   MCH 04/26/2024 32.5   26.0 - 34.0 pg Final   MCHC 04/26/2024 34.3  30.0 - 36.0 g/dL Final   RDW 92/81/7974 13.7  11.5 - 15.5 % Final   Platelets 04/26/2024 220  150 - 400 K/uL Final   nRBC 04/26/2024 0.0  0.0 - 0.2 % Final   Neutrophils Relative % 04/26/2024 48  % Final   Neutro Abs 04/26/2024 2.9  1.7 - 7.7 K/uL Final   Lymphocytes Relative 04/26/2024 37  % Final   Lymphs Abs 04/26/2024 2.2  0.7 - 4.0 K/uL Final   Monocytes Relative 04/26/2024 11  % Final   Monocytes Absolute 04/26/2024 0.7  0.1 - 1.0 K/uL Final   Eosinophils Relative 04/26/2024 2  % Final   Eosinophils Absolute 04/26/2024 0.1  0.0 - 0.5 K/uL Final   Basophils Relative 04/26/2024 1  % Final   Basophils Absolute 04/26/2024 0.1  0.0 - 0.1 K/uL Final   Immature Granulocytes 04/26/2024 1  % Final   Abs Immature Granulocytes 04/26/2024 0.03  0.00 - 0.07 K/uL Final   Performed at [facility], [address] [place], [place] [address]   Opiates 04/26/2024 NONE DETECTED  NONE DETECTED Final   Cocaine 04/26/2024 NONE DETECTED  NONE DETECTED Final   Benzodiazepines 04/26/2024 POSITIVE (A)  NONE DETECTED Final   Amphetamines 04/26/2024 NONE DETECTED  NONE DETECTED Final   Tetrahydrocannabinol 04/26/2024 POSITIVE (A)  NONE DETECTED Final   Barbiturates 04/26/2024 NONE DETECTED  NONE DETECTED Final   Comment: (NOTE) DRUG SCREEN FOR MEDICAL PURPOSES ONLY.  IF CONFIRMATION IS NEEDED FOR ANY PURPOSE, NOTIFY LAB WITHIN 5 DAYS.  LOWEST DETECTABLE LIMITS FOR URINE DRUG SCREEN Drug Class                     Cutoff (ng/mL) Amphetamine and metabolites    1000 Barbiturate and metabolites    200 Benzodiazepine                 200 Opiates and metabolites        300 Cocaine and metabolites        300 THC                            50 Performed at [facility], [address], [place], [place] [address]    Lactic Acid, Venous 04/26/2024 4.2 (HH)  0.5 - 1.9 mmol/L Final   Comment 04/26/2024 NOTIFIED PHYSICIAN   Final   Total CK 04/26/2024  432 (H)  49 - 397 U/L Final   Performed at [facility], [address], [place], [place] [address]   Lactic Acid, Venous 04/26/2024 3.4 (HH)  0.5 - 1.9 mmol/L Final   Comment 04/26/2024 NOTIFIED PHYSICIAN   Final  Admission on 04/18/2024, Discharged on 04/19/2024  Component Date Value Ref Range Status   WBC 04/18/2024 8.2  4.0 - 10.5 K/uL Final   RBC 04/18/2024 4.94  4.22 - 5.81 MIL/uL Final   Hemoglobin 04/18/2024 16.1  13.0 - 17.0 g/dL Final  HCT 04/18/2024 46.0  39.0 - 52.0 % Final   MCV 04/18/2024 93.1  80.0 - 100.0 fL Final   MCH 04/18/2024 32.6  26.0 - 34.0 pg Final   MCHC 04/18/2024 35.0  30.0 - 36.0 g/dL Final   RDW 92/89/7974 13.1  11.5 - 15.5 % Final   Platelets 04/18/2024 228  150 - 400 K/uL Final   nRBC 04/18/2024 0.0  0.0 - 0.2 % Final   Neutrophils Relative % 04/18/2024 48  % Final   Neutro Abs 04/18/2024 4.1  1.7 - 7.7 K/uL Final   Lymphocytes Relative 04/18/2024 34  % Final   Lymphs Abs 04/18/2024 2.8  0.7 - 4.0 K/uL Final   Monocytes Relative 04/18/2024 11  % Final   Monocytes Absolute 04/18/2024 0.9  0.1 - 1.0 K/uL Final   Eosinophils Relative 04/18/2024 3  % Final   Eosinophils Absolute 04/18/2024 0.2  0.0 - 0.5 K/uL Final   Basophils Relative 04/18/2024 2  % Final   Basophils Absolute 04/18/2024 0.1  0.0 - 0.1 K/uL Final   Immature Granulocytes 04/18/2024 2  % Final   Abs Immature Granulocytes 04/18/2024 0.15 (H)  0.00 - 0.07 K/uL Final   Performed at [facility], [address] [place], [place] [address]   Sodium 04/18/2024 138  135 - 145 mmol/L Final   Potassium 04/18/2024 3.6  3.5 - 5.1 mmol/L Final   HEMOLYSIS AT THIS LEVEL MAY AFFECT RESULT   Chloride 04/18/2024 101  98 - 111 mmol/L Final   CO2 04/18/2024 20 (L)  22 - 32 mmol/L Final   Glucose, Bld 04/18/2024 108 (H)  70 - 99 mg/dL Final   Glucose reference range applies only to samples taken after fasting for at least 8 hours.   BUN 04/18/2024 8  6 - 20 mg/dL Final   Creatinine, Ser  04/18/2024 0.86  0.61 - 1.24 mg/dL Final   Calcium  04/18/2024 9.5  8.9 - 10.3 mg/dL Final   Total Protein 92/89/7974 7.2  6.5 - 8.1 g/dL Final   Albumin 92/89/7974 4.3  3.5 - 5.0 g/dL Final   AST 92/89/7974 56 (H)  15 - 41 U/L Final   HEMOLYSIS AT THIS LEVEL MAY AFFECT RESULT   ALT 04/18/2024 33  0 - 44 U/L Final   HEMOLYSIS AT THIS LEVEL MAY AFFECT RESULT   Alkaline Phosphatase 04/18/2024 52  38 - 126 U/L Final   Total Bilirubin 04/18/2024 1.2  0.0 - 1.2 mg/dL Final   HEMOLYSIS AT THIS LEVEL MAY AFFECT RESULT   GFR, Estimated 04/18/2024 >60  >60 mL/min Final   Comment: (NOTE) Calculated using the CKD-EPI Creatinine Equation (2021)    Anion gap 04/18/2024 17 (H)  5 - 15 Final   Performed at [facility], [address] [place], [place] [address]   Alcohol, Ethyl (B) 04/18/2024 309 (HH)  <15 mg/dL Final   Comment: FIRST CALL ATTEMPT AT 0205 CRITICAL RESULT CALLED TO, READ BACK BY AND VERIFIED WITH DOROTHA SPALDING RN [phone] M. ALAMANO (NOTE) For medical purposes only. Performed at [facility], [address] [place], [place] [address]    Opiates 04/18/2024 NONE DETECTED  NONE DETECTED Final   Cocaine 04/18/2024 NONE DETECTED  NONE DETECTED Final   Benzodiazepines 04/18/2024 POSITIVE (A)  NONE DETECTED Final   Amphetamines 04/18/2024 NONE DETECTED  NONE DETECTED Final   Tetrahydrocannabinol 04/18/2024 POSITIVE (A)  NONE DETECTED Final   Barbiturates 04/18/2024 NONE DETECTED  NONE DETECTED Final  Comment: (NOTE) DRUG SCREEN FOR MEDICAL PURPOSES ONLY.  IF CONFIRMATION IS NEEDED FOR ANY PURPOSE, NOTIFY LAB WITHIN 5 DAYS.  LOWEST DETECTABLE LIMITS FOR URINE DRUG SCREEN Drug Class                     Cutoff (ng/mL) Amphetamine and metabolites    1000 Barbiturate and metabolites    200 Benzodiazepine                 200 Opiates and metabolites        300 Cocaine and metabolites        300 THC                            50 Performed at [facility], [address] [place], [place] [address]    Prothrombin Time 04/18/2024 13.7  11.4 - 15.2 seconds Final   INR 04/18/2024 1.0  0.8 - 1.2 Final   Comment: (NOTE) INR goal varies based on device and disease states. Performed at [facility], [address], [place], [place] [address]    Lipase 04/18/2024 37  11 - 51 U/L Final   Performed at [facility], [address], [place], [place] [address]   Ammonia 04/18/2024 26  9 - 35 umol/L Final   Performed at [facility], [address] [place], [place] [address]   aPTT 04/18/2024 28  24 - 36 seconds Final   Performed at [facility], [address] [place], [place] [address]   ABO/RH(D) 04/18/2024 O POS   Final   Antibody Screen 04/18/2024 NEG   Final   Sample Expiration 04/18/2024    Final                   Value:04/21/2024,2359 Performed at [facility], [address] [place], [place] [address]    HIV Screen 4th Generation wRfx 04/18/2024 Non Reactive  Non Reactive Final   Performed at [facility], [address] [place], [place] [address]   WBC 04/18/2024 8.2  4.0 - 10.5 K/uL Final   RBC 04/18/2024 4.50  4.22 - 5.81 MIL/uL Final   Hemoglobin 04/18/2024 14.6  13.0 - 17.0 g/dL Final   HCT 92/89/7974 42.3  39.0 - 52.0 % Final   MCV 04/18/2024 94.0  80.0 - 100.0 fL Final   MCH 04/18/2024 32.4  26.0 - 34.0 pg Final   MCHC 04/18/2024 34.5  30.0 - 36.0 g/dL Final   RDW 92/89/7974 13.1  11.5 - 15.5 % Final   Platelets 04/18/2024 181  150 - 400 K/uL Final   nRBC 04/18/2024 0.0  0.0 - 0.2 % Final   Performed at [facility], [address], [place], [place] [address]   ABO/RH(D) 04/18/2024    Final                   Value:O POS Performed at [facility], [address] [place], [place] [address]    WBC 04/18/2024 6.7  4.0 - 10.5 K/uL Final   RBC 04/18/2024 4.31  4.22 - 5.81 MIL/uL Final   Hemoglobin 04/18/2024 14.2  13.0 - 17.0 g/dL Final   HCT 92/89/7974 40.0  39.0 - 52.0 % Final   MCV  04/18/2024 92.8  80.0 - 100.0 fL Final   MCH 04/18/2024 32.9  26.0 - 34.0 pg  Final   MCHC 04/18/2024 35.5  30.0 - 36.0 g/dL Final   RDW 92/89/7974 13.1  11.5 - 15.5 % Final   Platelets 04/18/2024 166  150 - 400 K/uL Final   nRBC 04/18/2024 0.0  0.0 - 0.2 % Final   Performed at [facility], [address] [place], [place] [address]   WBC 04/19/2024 6.1  4.0 - 10.5 K/uL Final   RBC 04/19/2024 4.38  4.22 - 5.81 MIL/uL Final   Hemoglobin 04/19/2024 14.4  13.0 - 17.0 g/dL Final   HCT 92/88/7974 41.2  39.0 - 52.0 % Final   MCV 04/19/2024 94.1  80.0 - 100.0 fL Final   MCH 04/19/2024 32.9  26.0 - 34.0 pg Final   MCHC 04/19/2024 35.0  30.0 - 36.0 g/dL Final   RDW 92/88/7974 13.0  11.5 - 15.5 % Final   Platelets 04/19/2024 161  150 - 400 K/uL Final   nRBC 04/19/2024 0.0  0.0 - 0.2 % Final   Neutrophils Relative % 04/19/2024 49  % Final   Neutro Abs 04/19/2024 3.0  1.7 - 7.7 K/uL Final   Lymphocytes Relative 04/19/2024 34  % Final   Lymphs Abs 04/19/2024 2.1  0.7 - 4.0 K/uL Final   Monocytes Relative 04/19/2024 12  % Final   Monocytes Absolute 04/19/2024 0.8  0.1 - 1.0 K/uL Final   Eosinophils Relative 04/19/2024 3  % Final   Eosinophils Absolute 04/19/2024 0.2  0.0 - 0.5 K/uL Final   Basophils Relative 04/19/2024 1  % Final   Basophils Absolute 04/19/2024 0.1  0.0 - 0.1 K/uL Final   Immature Granulocytes 04/19/2024 1  % Final   Abs Immature Granulocytes 04/19/2024 0.06  0.00 - 0.07 K/uL Final   Performed at [facility], [address], [place], [place] [address]   Sodium 04/19/2024 139  135 - 145 mmol/L Final   Potassium 04/19/2024 2.8 (L)  3.5 - 5.1 mmol/L Final   DELTA CHECK NOTED   Chloride 04/19/2024 102  98 - 111 mmol/L Final   CO2 04/19/2024 26  22 - 32 mmol/L Final   Glucose, Bld 04/19/2024 89  70 - 99 mg/dL Final   Glucose reference range applies only to samples taken after fasting for at least 8 hours.   BUN 04/19/2024 <5 (L)  6 - 20 mg/dL Final   Creatinine, Ser  04/19/2024 0.83  0.61 - 1.24 mg/dL Final   Calcium  04/19/2024 9.2  8.9 - 10.3 mg/dL Final   Total Protein 92/88/7974 6.0 (L)  6.5 - 8.1 g/dL Final   Albumin 92/88/7974 3.4 (L)  3.5 - 5.0 g/dL Final   AST 92/88/7974 31  15 - 41 U/L Final   ALT 04/19/2024 26  0 - 44 U/L Final   Alkaline Phosphatase 04/19/2024 38  38 - 126 U/L Final   Total Bilirubin 04/19/2024 2.4 (H)  0.0 - 1.2 mg/dL Final   GFR, Estimated 04/19/2024 >60  >60 mL/min Final   Comment: (NOTE) Calculated using the CKD-EPI Creatinine Equation (2021)    Anion gap 04/19/2024 11  5 - 15 Final   Performed at [facility], [address], [place], [place] [address]   Magnesium  04/19/2024 1.6 (L)  1.7 - 2.4 mg/dL Final   Performed at [facility], [address] [place], [place] [address]   MRSA by PCR Next Gen 04/18/2024 NOT DETECTED  NOT DETECTED Final   Comment: (NOTE) The GeneXpert MRSA Assay (FDA approved for  NASAL specimens only), is one component of a comprehensive MRSA colonization surveillance program. It is not intended to diagnose MRSA infection nor to guide or monitor treatment for MRSA infections. Test performance is not FDA approved in patients less than 49 years old. Performed at [facility], [address], [place], [place] [address]    HCV Ab 04/18/2024 NON REACTIVE  NON REACTIVE Final   Comment: (NOTE) Nonreactive HCV antibody screen is consistent with no HCV infections,  unless recent infection is suspected or other evidence exists to indicate HCV infection.  Performed at [facility], [address] [place], [place] [address]    Hepatitis B Surface Ag 04/18/2024 NON REACTIVE  NON REACTIVE Final   Performed at [facility], [address] [place], [place] [address]   SURGICAL PATHOLOGY 04/19/2024    Final-Edited                   Value:SURGICAL PATHOLOGY CASE: [id number] PATIENT: Brett Sanford Surgical Pathology Report     Clinical History: hematemesis, coffee  ground emesis, R/O H. Pylori (cm)     FINAL MICROSCOPIC DIAGNOSIS:  A. STOMACH, ANTRUM AND BODY, BIOPSY: - Gastric antral mucosa with mild nonspecific reactive gastropathy - Gastric oxyntic mucosa with no specific histopathologic changes - Helicobacter pylori-like organisms are not identified on routine HE stain     GROSS DESCRIPTION:  Received in formalin are tan, soft tissue fragments that are submitted in toto. Number: 3 size: 0.2 cm, each blocks: 1 (KW, 04/19/2024)  Final Diagnosis performed by Katrine Muskrat, MD.   Electronically signed 04/22/2024 Technical component performed at [employer], [address] [place], [place] [address].  Professional component performed at [facility], [address] [place], [place] [address].  Immunohistochemistry Technical component (if [employer]) was performed at [employer]. [address], [place], [place] [address].   IMMUNOHISTOCHEMISTRY DISCLAIMER (if applicable): Some of these immunohistochemical stains may have been developed and the performance characteristics determine by [facility]. Some may not have been cleared or approved by the U.S. Food and Drug Administration. The FDA has determined that such clearance or approval is not necessary. This test is used for clinical purposes. It should not be regarded as investigational or for research. This laboratory is certified under the Clinical Laboratory Improvement Amendments of 1988 (CLIA-88) as qualified to perform high complexity clinical laboratory testing.  The controls stained appropriately.   IHC stains are performed on formalin fixed, paraffin embedded tissue using a 3,3diaminobenzidine (DAB) chromogen and Leica Bond Autostainer System. The staining intensity of the nucleus is score manually and is reported                          as the percentage of tumor cell  nuclei demonstrating specific nuclear staining. The specimens are fixed in 10% Neutral Formalin for at least 6 hours and up to 72hrs. These tests are validated on decalcified tissue. Results should be interpreted with caution given the possibility of false negative results on decalcified specimens. Antibody Clones are as follows ER-clone 49F, PR-clone 16, Ki67- clone MM1. Some of these immunohistochemical stains may have been developed and the performance characteristics determined by [facility].   Admission on 03/31/2024, Discharged on 04/02/2024  Component  Date Value Ref Range Status   Salicylate Lvl 03/31/2024 <7.0 (L)  7.0 - 30.0 mg/dL Final   Performed at [facility], [address], [place], [place] [address]   Acetaminophen  (Tylenol ), Serum 03/31/2024 <10 (L)  10 - 30 ug/mL Final   Comment: (NOTE) Therapeutic concentrations vary significantly. A range of 10-30 ug/mL  may be an effective concentration for many patients. However, some  are best treated at concentrations outside of this range. Acetaminophen  concentrations >150 ug/mL at 4 hours after ingestion  and >50 ug/mL at 12 hours after ingestion are often associated with  toxic reactions.  Performed at [facility], [address], [place], [place] [address]    Alcohol, Ethyl (B) 03/31/2024 130 (H)  <15 mg/dL Final   Comment: (NOTE) For medical purposes only. Performed at [facility], [address] [place], [place] [address]    Opiates 03/31/2024 NONE DETECTED  NONE DETECTED Final   Cocaine 03/31/2024 NONE DETECTED  NONE DETECTED Final   Benzodiazepines 03/31/2024 POSITIVE (A)  NONE DETECTED Final   Amphetamines 03/31/2024 NONE DETECTED  NONE DETECTED Final   Tetrahydrocannabinol 03/31/2024 POSITIVE (A)  NONE DETECTED Final   Barbiturates 03/31/2024 NONE DETECTED  NONE DETECTED Final   Comment: (NOTE) DRUG SCREEN FOR MEDICAL PURPOSES ONLY.  IF  CONFIRMATION IS NEEDED FOR ANY PURPOSE, NOTIFY LAB WITHIN 5 DAYS.  LOWEST DETECTABLE LIMITS FOR URINE DRUG SCREEN Drug Class                     Cutoff (ng/mL) Amphetamine and metabolites    1000 Barbiturate and metabolites    200 Benzodiazepine                 200 Opiates and metabolites        300 Cocaine and metabolites        300 THC                            50 Performed at [facility], [address], [place], [place] [address]    WBC 03/31/2024 8.4  4.0 - 10.5 K/uL Final   RBC 03/31/2024 4.96  4.22 - 5.81 MIL/uL Final   Hemoglobin 03/31/2024 16.5  13.0 - 17.0 g/dL Final   HCT 93/77/7974 48.5  39.0 - 52.0 % Final   MCV 03/31/2024 97.8  80.0 - 100.0 fL Final   MCH 03/31/2024 33.3  26.0 - 34.0 pg Final   MCHC 03/31/2024 34.0  30.0 - 36.0 g/dL Final   RDW 93/77/7974 12.8  11.5 - 15.5 % Final   Platelets 03/31/2024 306  150 - 400 K/uL Final   nRBC 03/31/2024 0.0  0.0 - 0.2 % Final   Neutrophils Relative % 03/31/2024 58  % Final   Neutro Abs 03/31/2024 4.9  1.7 - 7.7 K/uL Final   Lymphocytes Relative 03/31/2024 32  % Final   Lymphs Abs 03/31/2024 2.7  0.7 - 4.0 K/uL Final   Monocytes Relative 03/31/2024 7  % Final   Monocytes Absolute 03/31/2024 0.6  0.1 - 1.0 K/uL Final   Eosinophils Relative 03/31/2024 1  % Final   Eosinophils Absolute 03/31/2024 0.1  0.0 - 0.5 K/uL Final   Basophils Relative 03/31/2024 1  % Final   Basophils Absolute 03/31/2024 0.1  0.0 - 0.1 K/uL Final   Immature Granulocytes 03/31/2024 1  % Final   Abs Immature Granulocytes 03/31/2024 0.09 (  H)  0.00 - 0.07 K/uL Final   Performed at [facility], [address] [place], [place] [address]   Sodium 03/31/2024 137  135 - 145 mmol/L Final   Potassium 03/31/2024 3.5  3.5 - 5.1 mmol/L Final   Chloride 03/31/2024 103  98 - 111 mmol/L Final   CO2 03/31/2024 20 (L)  22 - 32 mmol/L Final   Glucose, Bld 03/31/2024 156 (H)  70 - 99 mg/dL Final   Glucose reference range  applies only to samples taken after fasting for at least 8 hours.   BUN 03/31/2024 18  6 - 20 mg/dL Final   Creatinine, Ser 03/31/2024 0.92  0.61 - 1.24 mg/dL Final   Calcium  03/31/2024 8.8 (L)  8.9 - 10.3 mg/dL Final   Total Protein 93/77/7974 6.7  6.5 - 8.1 g/dL Final   Albumin 93/77/7974 3.8  3.5 - 5.0 g/dL Final   AST 93/77/7974 30  15 - 41 U/L Final   ALT 03/31/2024 24  0 - 44 U/L Final   Alkaline Phosphatase 03/31/2024 48  38 - 126 U/L Final   Total Bilirubin 03/31/2024 1.0  0.0 - 1.2 mg/dL Final   GFR, Estimated 03/31/2024 >60  >60 mL/min Final   Comment: (NOTE) Calculated using the CKD-EPI Creatinine Equation (2021)    Anion gap 03/31/2024 14  5 - 15 Final   Performed at [facility], [address] [place], [place] [address]  Admission on 03/11/2024, Discharged on 03/12/2024  Component Date Value Ref Range Status   WBC 03/11/2024 7.3  4.0 - 10.5 K/uL Final   RBC 03/11/2024 5.72  4.22 - 5.81 MIL/uL Final   Hemoglobin 03/11/2024 19.0 (H)  13.0 - 17.0 g/dL Final   HCT 93/97/7974 54.7 (H)  39.0 - 52.0 % Final   MCV 03/11/2024 95.6  80.0 - 100.0 fL Final   MCH 03/11/2024 33.2  26.0 - 34.0 pg Final   MCHC 03/11/2024 34.7  30.0 - 36.0 g/dL Final   RDW 93/97/7974 12.3  11.5 - 15.5 % Final   Platelets 03/11/2024 359  150 - 400 K/uL Final   nRBC 03/11/2024 0.0  0.0 - 0.2 % Final   Neutrophils Relative % 03/11/2024 52  % Final   Neutro Abs 03/11/2024 3.8  1.7 - 7.7 K/uL Final   Lymphocytes Relative 03/11/2024 35  % Final   Lymphs Abs 03/11/2024 2.6  0.7 - 4.0 K/uL Final   Monocytes Relative 03/11/2024 10  % Final   Monocytes Absolute 03/11/2024 0.7  0.1 - 1.0 K/uL Final   Eosinophils Relative 03/11/2024 1  % Final   Eosinophils Absolute 03/11/2024 0.1  0.0 - 0.5 K/uL Final   Basophils Relative 03/11/2024 1  % Final   Basophils Absolute 03/11/2024 0.1  0.0 - 0.1 K/uL Final   Immature Granulocytes 03/11/2024 1  % Final   Abs Immature Granulocytes 03/11/2024 0.05   0.00 - 0.07 K/uL Final   Performed at [facility], [address] [place], [place] [address]   Sodium 03/11/2024 137  135 - 145 mmol/L Final   Potassium 03/11/2024 3.8  3.5 - 5.1 mmol/L Final   Chloride 03/11/2024 95 (L)  98 - 111 mmol/L Final   CO2 03/11/2024 25  22 - 32 mmol/L Final   Glucose, Bld 03/11/2024 95  70 - 99 mg/dL Final   Glucose reference range applies only to samples taken after fasting for at least 8 hours.   BUN 03/11/2024 7  6 - 20 mg/dL Final   Creatinine, Ser 03/11/2024 1.10  0.61 - 1.24 mg/dL Final   Calcium  03/11/2024 10.4 (H)  8.9 - 10.3 mg/dL Final   Total Protein 93/97/7974 7.7  6.5 - 8.1 g/dL Final   Albumin 93/97/7974 4.5  3.5 - 5.0 g/dL Final   AST 93/97/7974 130 (H)  15 - 41 U/L Final   ALT 03/11/2024 109 (H)  0 - 44 U/L Final   Alkaline Phosphatase 03/11/2024 59  38 - 126 U/L Final   Total Bilirubin 03/11/2024 1.6 (H)  0.0 - 1.2 mg/dL Final   GFR, Estimated 03/11/2024 >60  >60 mL/min Final   Comment: (NOTE) Calculated using the CKD-EPI Creatinine Equation (2021)    Anion gap 03/11/2024 17 (H)  5 - 15 Final   Performed at [facility], [address] [place], [place] [address]   Alcohol, Ethyl (B) 03/11/2024 195 (H)  <15 mg/dL Final   Comment: (NOTE) For medical purposes only. Performed at [facility], [address], [place], [place] [address]    TSH 03/11/2024 2.348  0.350 - 4.500 uIU/mL Final   Comment: Performed by a 3rd Generation assay with a functional sensitivity of <=0.01 uIU/mL. Performed at [facility], [address] [place], [place] [address]    POC Amphetamine UR 03/11/2024 None Detected  NONE DETECTED (Cut Off Level 1000 ng/mL) Final   POC Secobarbital (BAR) 03/11/2024 None Detected  NONE DETECTED (Cut Off Level 300 ng/mL) Final   POC Buprenorphine (BUP) 03/11/2024 None Detected  NONE DETECTED (Cut Off Level 10 ng/mL) Final   POC Oxazepam (BZO) 03/11/2024 Positive (A)  NONE DETECTED (Cut Off Level 300  ng/mL) Final   POC Cocaine UR 03/11/2024 None Detected  NONE DETECTED (Cut Off Level 300 ng/mL) Final   POC Methamphetamine UR 03/11/2024 None Detected  NONE DETECTED (Cut Off Level 1000 ng/mL) Final   POC Morphine  03/11/2024 None Detected  NONE DETECTED (Cut Off Level 300 ng/mL) Final   POC Methadone UR 03/11/2024 None Detected  NONE DETECTED (Cut Off Level 300 ng/mL) Final   POC Oxycodone UR 03/11/2024 None Detected  NONE DETECTED (Cut Off Level 100 ng/mL) Final   POC Marijuana UR 03/11/2024 Positive (A)  NONE DETECTED (Cut Off Level 50 ng/mL) Final  Admission on 02/25/2024, Discharged on 02/25/2024  Component Date Value Ref Range Status   Influenza A, POC 02/25/2024 Positive (A)  Negative Final   Influenza B, POC 02/25/2024 Negative  Negative Final    Allergies: Reglan  [metoclopramide ]  Medications:  Facility Ordered Medications  Medication   alum & mag hydroxide-simeth (MAALOX/MYLANTA) 200-200-20 MG/5ML suspension 30 mL   magnesium  hydroxide (MILK OF MAGNESIA) suspension 30 mL   OLANZapine  zydis (ZYPREXA ) disintegrating tablet 5 mg   OLANZapine  (ZYPREXA ) injection 5 mg   OLANZapine  (ZYPREXA ) injection 10 mg   [COMPLETED] thiamine  (VITAMIN B1) injection 100 mg   thiamine  (VITAMIN B1) tablet 100 mg   multivitamin with minerals tablet 1 tablet   loperamide  (IMODIUM ) capsule 2-4 mg   ondansetron  (ZOFRAN -ODT) disintegrating tablet 4 mg   [COMPLETED] cloNIDine  (CATAPRES ) tablet 0.1 mg   propranolol  (INDERAL ) tablet 10 mg   [COMPLETED] LORazepam  (ATIVAN ) tablet 1 mg   Followed by   LORazepam  (ATIVAN ) tablet 1 mg   Followed by   NOREEN ON 04/29/2024] LORazepam  (ATIVAN ) tablet 1 mg   Followed by   NOREEN ON 05/01/2024] LORazepam  (ATIVAN ) tablet 1 mg   pantoprazole  (PROTONIX ) EC tablet 20 mg  propranolol  (INDERAL ) tablet 10 mg   hydrOXYzine  (ATARAX ) tablet 50 mg   cloNIDine  (CATAPRES ) tablet 0.1 mg   PTA Medications  Medication Sig   propranolol  (INDERAL ) 10 MG tablet Take 1  tablet (10 mg total) by mouth every 6 (six) hours as needed for up to 15 days (anxiety).   pantoprazole  (PROTONIX ) 40 MG tablet Take 1 tablet (40 mg total) by mouth 2 (two) times daily before a meal.   thiamine  (VITAMIN B1) 100 MG tablet Take 1 tablet (100 mg total) by mouth daily.   sertraline  (ZOLOFT ) 50 MG tablet Take 1 tablet (50 mg total) by mouth daily. (Patient not taking: Reported on 04/18/2024)   Long Term Goals: Improvement in symptoms so as ready for discharge  Short Term Goals: Patient will verbalize feelings in meetings with treatment team members., Patient will attend at least of 50% of the groups daily., Pt will complete the PHQ9 on admission, day 3 and discharge., Patient will participate in completing the [place] Suicide Severity Rating Scale, Patient will score a low risk of violence for 24 hours prior to discharge, and Patient will take medications as prescribed daily.  Medical Decision Making  - Admitted to the facility based crisis center -Baseline labs ordered including hemoglobin A1c, TSH, lipid panel.  BAL 257 on admission.  Lactic acid was 3.4, we will recheck to ensure that it has triggered downwards.  We will also recheck CK which was 432 to 2 days ago. LFTs slightly elevated, but might be due to alcoholism.  We will educate patient to follow this and with his PCP at discharge.  Also urging the following lab: Vitamins B12, vitamin D . EKG WNL.   Patient on the following medications: -Start Clonidine  0.1 mg for SBP>160 or DBP>100 -Ativan  taper for alcohol use disorder-See MAR for detailed order -Hydroxyzine  25 mg 3 times daily as needed for anxiety -Inderal  10 mg 3 times daily for GAD/tachycardia/hypertension -Agitation protocol: Ativan /Benadryl/Haldol  as needed-Please see MAR for detailed order -Oral thiamine  and MVI replacement  Recommendations  - Abstinence from substances encouraged -SW to look into options for outpatient SA treatment at discharge  -Monitor for  signs of withdrawal via CIWAs as ordered -Q15 minute checks as per unit protocol -Attend unit group sessions as mandated -72 hr notice required if needing to be discharged  Donia Snell, NP 04/28/24  3:04 PM

## 2024-04-28 NOTE — ED Notes (Signed)
 Resting in bed without issue.  Patient reports a decrease in anxiety since he received the vistaril .  Will monitor.

## 2024-04-28 NOTE — ED Notes (Signed)
 Patient awake and alert on unit.  He declined breakfast this morning.  Patient asking for anxiety medication for reports of anxiousness.  No withdrawal at this time.

## 2024-04-28 NOTE — ED Notes (Signed)
 Patient complained of anxiety.  50mg  vistaril  given PO PRN.  DR Cole made aware.  CIWA = 4

## 2024-04-28 NOTE — Group Note (Signed)
 Group Topic: Decisional Balance/Substance Abuse  Group Date: 04/28/2024 Start Time: 0800 End Time: 0900 Facilitators: Lonzell Dwayne RAMAN, NT  Department: [facility]  Number of Participants: 8  Group Focus: chemical dependency issues Treatment Modality:  Patient-Centered Therapy Interventions utilized were patient education Purpose: reinforce self-care  Name: Brett Sanford Date of Birth: Nov 29, 1990  MR: [id number]    Level of Participation: Attended groupwithdrawn Quality of Participation: attentive Interactions with others: Patient did not interact Mood/Affect: flat Triggers (if applicable): N/A Cognition: coherent/clear Progress: Minimal Response: Appropriate  Plan: patient will be encouraged to follow up with treatment plan.  Patients Problems:  Patient Active Problem List   Diagnosis Date Noted   Alcohol use disorder 04/27/2024   Gastroesophageal reflux disease with esophagitis and hemorrhage 04/19/2024   Gastritis without bleeding 04/19/2024   Hematemesis 04/18/2024   Syncope 04/18/2024   LFT elevation 04/18/2024   Alcohol abuse 04/02/2024   Substance abuse (HCC) 04/01/2024   Alcohol use disorder, severe, dependence (HCC) 03/12/2024   MDD (major depressive disorder), recurrent episode, severe (HCC) 08/02/2023   Drug overdose, intentional self-harm, initial encounter (HCC) 10/07/2019   Alcoholic intoxication without complication (HCC)    Suicide attempt (HCC)    Viral illness 08/21/2019   Hypokalemia 06/23/2019   Attention deficit hyperactivity disorder (ADHD) 06/23/2019   Seizure-like activity (HCC) 06/23/2019   History of alcohol abuse 06/23/2019   Chronic migraine 06/23/2019   Diarrhea 06/23/2019

## 2024-04-29 DIAGNOSIS — F411 Generalized anxiety disorder: Secondary | ICD-10-CM | POA: Diagnosis not present

## 2024-04-29 DIAGNOSIS — F1220 Cannabis dependence, uncomplicated: Secondary | ICD-10-CM | POA: Diagnosis not present

## 2024-04-29 DIAGNOSIS — I10 Essential (primary) hypertension: Secondary | ICD-10-CM | POA: Diagnosis not present

## 2024-04-29 DIAGNOSIS — F332 Major depressive disorder, recurrent severe without psychotic features: Secondary | ICD-10-CM | POA: Diagnosis not present

## 2024-04-29 MED ORDER — IBUPROFEN 400 MG PO TABS
400.0000 mg | ORAL_TABLET | Freq: Four times a day (QID) | ORAL | Status: DC | PRN
  Administered 2024-04-29 – 2024-05-01 (×3): 400 mg via ORAL
  Filled 2024-04-29 (×3): qty 1

## 2024-04-29 MED ORDER — MIRTAZAPINE 7.5 MG PO TABS
7.5000 mg | ORAL_TABLET | Freq: Every day | ORAL | Status: DC
  Administered 2024-04-29 – 2024-04-30 (×2): 7.5 mg via ORAL
  Filled 2024-04-29: qty 1
  Filled 2024-04-29: qty 7
  Filled 2024-04-29: qty 1

## 2024-04-29 NOTE — ED Notes (Signed)
 Around the mid night patient was reading but now he is sleeping. His environment is quiet.

## 2024-04-29 NOTE — ED Notes (Signed)
 Patient awake and alert this morning.  He was out of bed this morning for breakfast and vital signs.  He reports feeling better and appears less anxious.  No withdrawal or somatic distress.  Patient denies avh shi or plan.  Will monitor.

## 2024-04-29 NOTE — Group Note (Signed)
 Group Topic: Change and Accountability  Group Date: 04/29/2024 Start Time: 2100 End Time: 2200 Facilitators: Joan Plowman B  Department: [facility]  Number of Participants: 1  Group Focus: abuse issues, acceptance, anger management, anxiety, chemical dependency education, chemical dependency issues, co-dependency, daily focus, depression, diagnosis education, discharge education, and individual meeting Treatment Modality:  Individual Therapy Interventions utilized were leisure development Purpose: enhance coping skills, express feelings, express irrational fears, increase insight, and relapse prevention strategies  Name: Brett Sanford Date of Birth: [birth year]  MR: [id number]    Level of Participation: active Quality of Participation: attentive, cooperative, initiates communication, and motivated Interactions with others: gave feedback Mood/Affect: appropriate, brightens with interaction, and positive Triggers (if applicable): NA Cognition: coherent/clear Progress: Gaining insight Response: He has a plan that will work for him and what he needs. He's in the middle of handing family issues as well as his own needs. I think he's got a good grasp on what he needs to do for HIM.  Plan: patient will be encouraged to keep going to groups.   Patients Problems:  Patient Active Problem List   Diagnosis Date Noted   Alcohol use disorder 04/27/2024   Gastroesophageal reflux disease with esophagitis and hemorrhage 04/19/2024   Gastritis without bleeding 04/19/2024   Hematemesis 04/18/2024   Syncope 04/18/2024   LFT elevation 04/18/2024   Alcohol abuse 04/02/2024   Substance abuse (HCC) 04/01/2024   Alcohol use disorder, severe, dependence (HCC) 03/12/2024   MDD (major depressive disorder), recurrent episode, severe (HCC) 08/02/2023   Drug overdose, intentional self-harm, initial encounter (HCC) 10/07/2019   Alcoholic intoxication without complication (HCC)     Suicide attempt (HCC)    Viral illness 08/21/2019   Hypokalemia 06/23/2019   Attention deficit hyperactivity disorder (ADHD) 06/23/2019   Seizure-like activity (HCC) 06/23/2019   History of alcohol abuse 06/23/2019   Chronic migraine 06/23/2019   Diarrhea 06/23/2019

## 2024-04-29 NOTE — ED Notes (Signed)
 Patient reported that he is better today. He said, I am anxious It could be because of withdrawal. He also stated that he is depressed. He has sleep disturbances. At mid night he was reading,not able to fall asleep. He reported not having adequate nutrition because of poor appetite. Denied N/V, SI/HI, and AVH. He said, my tremor is improving. Denied sweating . Med compliant. Cooperative and calm, no issue noted.

## 2024-04-29 NOTE — ED Provider Notes (Signed)
 Behavioral Health Progress Note   Date: 04/29/24 Patient Name: Brett Sanford MRN: [id number] Chief Complaint: Worsening alcohol & THC dependency  Diagnoses:  Final diagnoses:  Alcohol use disorder  GAD (generalized anxiety disorder)  Severe episode of recurrent major depressive disorder, without psychotic features (HCC)   HPI: Brett Sanford is a 33 y.o. Caucasian male with a reported prior mental health history of MDD, ADHD, GAD & alcohol use d/o who presented to the [facility] on 06/28/24 seeking assistance with regaining his sobriety from alcohol. Pt reported during assessment that his last drink consisted of a pint of Vodka prior to presenting at this urgent care. Patient was [facility] to the [facility] here at the [facility] center with a goal of detoxification from alcohol, and possible rehabilitation placement thereafter.  Assessment, 04/29/2024: On assessment today, the pt reports that their mood is still depressed, but adds that there is a significant improvement as compared to yesterday. Other than mild tremors that are visible to his b/l arms, there are no overt signs of withdrawals. Patient reports that anxiety today is also less as compared to yesterday. Reports that anxiety is rated today as 5, with 10 being, the worst.  Sleep is fair, but improving. Appetite is fair and also improving. Concentration is good.  Energy level is fair and also improving. Denies suicidal thoughts. Denies suicidal intent and plan. Denies paranoia and denies delusional thinking. There are no overt signs of psychosis and pt denies first rank symptoms. Denies having any HI.  Denies having psychotic symptoms.   Denies having side effects to current psychiatric medications. Reports however that Zoloft  as well as other SSRI type medications caused sexual dysfunction in the past. He is asking for the Zoloft  to be discontinued. We talked about replacing with Remeron  at  the low dose of 7.5 initially, with a potential to cap at a max dose of 15 mg nightly to mitigate the potential for weight gain tp which patient was agreeable. Benefits, rationales and side effects of this medication discussed with patient. He is agreeable to trying it. 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  Ativan  taper ends on 07/23, and pt's projected discharge date will be same day of 07/24, or any other day coordinated with CSW.   Labs Reviewed: Ordered repeat CK, Vit D, Vit B12, and labs are still pending.  PHQ 2-9:  Flowsheet Row ED from 04/27/2024 in [facility] Most recent reading at 04/28/2024  3:02 PM ED from 04/27/2024 in [facility] Most recent reading at 04/27/2024  9:13 AM ED from 04/02/2024 in [facility] Most recent reading at 04/03/2024  9:58 AM  Thoughts that you would be better off dead, or of hurting yourself in some way Several days Not at all Several days  PHQ-9 Total Score 9 7 11     Flowsheet Row ED from 04/27/2024 in [facility] Most recent reading at 04/27/2024 10:15 AM ED from 04/27/2024 in [facility] Most recent reading at 04/27/2024  9:13 AM ED from 04/26/2024 in [facility] Emergency Department at [facility] Most recent reading at 04/26/2024  1:09 AM  C-SSRS RISK CATEGORY High Risk Error: Question 2 not populated Error: Q3, 4, or  5 should not be populated when Q2 is No    Screenings    Flowsheet Row Most Recent Value  CIWA-Ar Total 2    Total Time spent with patient: 1.5 hours  Musculoskeletal  Strength & Muscle Tone: within normal limits Gait & Station: normal Patient leans: N/A  Psychiatric Specialty Exam  Presentation General Appearance:  Fairly  Groomed  Eye Contact: Fair  Speech: Clear and Coherent  Speech Volume: Normal  Handedness: Right   Mood and Affect  Mood: Depressed; Anxious  Affect: Congruent   Thought Process  Thought Processes: Coherent  Descriptions of Associations:Intact  Orientation:Full (Time, Place and Person)  Thought Content:Logical  Diagnosis of Schizophrenia or Schizoaffective disorder in past: No   Hallucinations:Hallucinations: None  Ideas of Reference:None  Suicidal Thoughts:Suicidal Thoughts: No  Homicidal Thoughts:Homicidal Thoughts: No   Sensorium  Memory: Immediate Fair  Judgment: Fair  Insight: Fair   Art therapist  Concentration: Fair  Attention Span: Fair  Recall: Fair  Fund of Knowledge: Fair  Language: Fair   Psychomotor Activity  Psychomotor Activity: Psychomotor Activity: Normal   Assets  Assets: Resilience   Sleep  Sleep: Sleep: Fair   Nutritional Assessment (For OBS and FBC admissions only) Has the patient had a weight loss or gain of 10 pounds or more in the last 3 months?: No Has the patient had a decrease in food intake/or appetite?: Yes Does the patient have dental problems?: No Does the patient have eating habits or behaviors that may be indicators of an eating disorder including binging or inducing vomiting?: No Has the patient recently lost weight without trying?: 0 Has the patient been eating poorly because of a decreased appetite?: 0 Malnutrition Screening Tool Score: 0    Physical Exam Constitutional:      Appearance: Normal appearance.  Musculoskeletal:        General: Normal range of motion.     Cervical back: Normal range of motion.  Neurological:     General: No focal deficit present.     Mental Status: He is alert and oriented to person, place, and time.    Review of Systems  Psychiatric/Behavioral:  Positive for depression and substance abuse. Negative for hallucinations, memory loss and  suicidal ideas. The patient is nervous/anxious and has insomnia.   All other systems reviewed and are negative.   Blood pressure (!) 137/95, pulse 71, temperature 98.7 F (37.1 C), temperature source Oral, resp. rate 16, SpO2 99%. There is no height or weight on file to calculate BMI.  Is the patient at risk to self? Yes -With worsening alcohol consumption Has the patient been a risk to self in the past 6 months? Yes .    Has the patient been a risk to self within the distant past? No   Is the patient a risk to others? No   Has the patient been a risk to others in the past 6 months? No   Has the patient been a risk to others within the distant past? No   Past Medical History: GERD Last Labs:  Admission on 04/27/2024  Component Date Value Ref Range Status   Color, Urine 04/27/2024 AMBER (A)  YELLOW Final   BIOCHEMICALS MAY BE AFFECTED BY COLOR   APPearance 04/27/2024 CLEAR  CLEAR Final   Specific Gravity, Urine 04/27/2024 1.023  1.005 - 1.030 Final   pH 04/27/2024 5.0  5.0 - 8.0 Final   Glucose, UA 04/27/2024 NEGATIVE  NEGATIVE mg/dL Final   Hgb urine dipstick 04/27/2024  NEGATIVE  NEGATIVE Final   Bilirubin Urine 04/27/2024 NEGATIVE  NEGATIVE Final   Ketones, ur 04/27/2024 5 (A)  NEGATIVE mg/dL Final   Protein, ur 92/80/7974 NEGATIVE  NEGATIVE mg/dL Final   Nitrite 92/80/7974 NEGATIVE  NEGATIVE Final   Leukocytes,Ua 04/27/2024 NEGATIVE  NEGATIVE Final   Performed at [facility], [address], [place], [place] [address]   POC Amphetamine UR 04/27/2024 None Detected  NONE DETECTED (Cut Off Level 1000 ng/mL) Final   POC Secobarbital (BAR) 04/27/2024 None Detected  NONE DETECTED (Cut Off Level 300 ng/mL) Final   POC Buprenorphine (BUP) 04/27/2024 None Detected  NONE DETECTED (Cut Off Level 10 ng/mL) Final   POC Oxazepam (BZO) 04/27/2024 Positive (A)  NONE DETECTED (Cut Off Level 300 ng/mL) Final   POC Cocaine UR 04/27/2024 None Detected  NONE DETECTED (Cut Off Level 300 ng/mL)  Final   POC Methamphetamine UR 04/27/2024 None Detected  NONE DETECTED (Cut Off Level 1000 ng/mL) Final   POC Morphine  04/27/2024 None Detected  NONE DETECTED (Cut Off Level 300 ng/mL) Final   POC Methadone UR 04/27/2024 None Detected  NONE DETECTED (Cut Off Level 300 ng/mL) Final   POC Oxycodone UR 04/27/2024 None Detected  NONE DETECTED (Cut Off Level 100 ng/mL) Final   POC Marijuana UR 04/27/2024 Positive (A)  NONE DETECTED (Cut Off Level 50 ng/mL) Final  Admission on 04/27/2024, Discharged on 04/27/2024  Component Date Value Ref Range Status   WBC 04/27/2024 5.5  4.0 - 10.5 K/uL Final   RBC 04/27/2024 5.13  4.22 - 5.81 MIL/uL Final   Hemoglobin 04/27/2024 16.5  13.0 - 17.0 g/dL Final   HCT 92/80/7974 47.9  39.0 - 52.0 % Final   MCV 04/27/2024 93.4  80.0 - 100.0 fL Final   MCH 04/27/2024 32.2  26.0 - 34.0 pg Final   MCHC 04/27/2024 34.4  30.0 - 36.0 g/dL Final   RDW 92/80/7974 13.8  11.5 - 15.5 % Final   Platelets 04/27/2024 224  150 - 400 K/uL Final   nRBC 04/27/2024 0.0  0.0 - 0.2 % Final   Neutrophils Relative % 04/27/2024 59  % Final   Neutro Abs 04/27/2024 3.3  1.7 - 7.7 K/uL Final   Lymphocytes Relative 04/27/2024 27  % Final   Lymphs Abs 04/27/2024 1.5  0.7 - 4.0 K/uL Final   Monocytes Relative 04/27/2024 10  % Final   Monocytes Absolute 04/27/2024 0.5  0.1 - 1.0 K/uL Final   Eosinophils Relative 04/27/2024 2  % Final   Eosinophils Absolute 04/27/2024 0.1  0.0 - 0.5 K/uL Final   Basophils Relative 04/27/2024 1  % Final   Basophils Absolute 04/27/2024 0.1  0.0 - 0.1 K/uL Final   Immature Granulocytes 04/27/2024 1  % Final   Abs Immature Granulocytes 04/27/2024 0.03  0.00 - 0.07 K/uL Final   Performed at [facility], [address] [place], [place] [address]   Sodium 04/27/2024 139  135 - 145 mmol/L Final   Potassium 04/27/2024 3.9  3.5 - 5.1 mmol/L Final   Chloride 04/27/2024 99  98 - 111 mmol/L Final   CO2 04/27/2024 20 (L)  22 - 32 mmol/L Final   Glucose, Bld  04/27/2024 72  70 - 99 mg/dL Final   Glucose reference range applies only to samples taken after fasting for at least 8 hours.   BUN 04/27/2024 8  6 - 20 mg/dL Final   Creatinine, Ser 04/27/2024 0.87  0.61 - 1.24 mg/dL Final  Calcium  04/27/2024 9.0  8.9 - 10.3 mg/dL Final   Total Protein 92/80/7974 6.7  6.5 - 8.1 g/dL Final   Albumin 92/80/7974 4.1  3.5 - 5.0 g/dL Final   AST 92/80/7974 68 (H)  15 - 41 U/L Final   ALT 04/27/2024 49 (H)  0 - 44 U/L Final   Alkaline Phosphatase 04/27/2024 55  38 - 126 U/L Final   Total Bilirubin 04/27/2024 1.6 (H)  0.0 - 1.2 mg/dL Final   GFR, Estimated 04/27/2024 >60  >60 mL/min Final   Comment: (NOTE) Calculated using the CKD-EPI Creatinine Equation (2021)    Anion gap 04/27/2024 20 (H)  5 - 15 Final   Performed at [facility], [address] [place], [place] [address]   Hgb A1c MFr Bld 04/27/2024 4.7 (L)  4.8 - 5.6 % Final   Comment: (NOTE) Diagnosis of Diabetes The following HbA1c ranges recommended by the American Diabetes Association (ADA) may be used as an aid in the diagnosis of diabetes mellitus.  Hemoglobin             Suggested A1C NGSP%              Diagnosis  <5.7                   Non Diabetic  5.7-6.4                Pre-Diabetic  >6.4                   Diabetic  <7.0                   Glycemic control for                       adults with diabetes.     Mean Plasma Glucose 04/27/2024 88.19  mg/dL Final   Performed at [facility], [address], [place], [place] [address]   Magnesium  04/27/2024 1.7  1.7 - 2.4 mg/dL Final   Performed at [facility], [address], [place], [place] [address]   Alcohol, Ethyl (B) 04/27/2024 257 (H)  <15 mg/dL Final   Comment: (NOTE) For medical purposes only. Performed at [facility], [address], [place], [place] [address]    RPR Ser Ql 04/27/2024 NON REACTIVE  NON REACTIVE Final   Performed at [facility], [address], [place], [place] [address]   Admission on 04/26/2024, Discharged on 04/26/2024  Component Date Value Ref Range Status   Sodium 04/26/2024 140  135 - 145 mmol/L Final   Potassium 04/26/2024 3.6  3.5 - 5.1 mmol/L Final   Chloride 04/26/2024 104  98 - 111 mmol/L Final   CO2 04/26/2024 19 (L)  22 - 32 mmol/L Final   Glucose, Bld 04/26/2024 87  70 - 99 mg/dL Final   Glucose reference range applies only to samples taken after fasting for at least 8 hours.   BUN 04/26/2024 12  6 - 20 mg/dL Final   Creatinine, Ser 04/26/2024 0.94  0.61 - 1.24 mg/dL Final   Calcium  04/26/2024 8.7 (L)  8.9 - 10.3 mg/dL Final   Total Protein 92/81/7974 6.6  6.5 - 8.1 g/dL Final   Albumin 92/81/7974 3.7  3.5 - 5.0 g/dL Final   AST 92/81/7974 56 (H)  15 - 41 U/L Final   ALT 04/26/2024 42  0 - 44 U/L Final  Alkaline Phosphatase 04/26/2024 55  38 - 126 U/L Final   Total Bilirubin 04/26/2024 0.9  0.0 - 1.2 mg/dL Final   GFR, Estimated 04/26/2024 >60  >60 mL/min Final   Comment: (NOTE) Calculated using the CKD-EPI Creatinine Equation (2021)    Anion gap 04/26/2024 17 (H)  5 - 15 Final   Performed at [facility], [address], [place], [place] [address]   Alcohol, Ethyl (B) 04/26/2024 370 (HH)  <15 mg/dL Final   Comment: CRITICAL RESULT CALLED TO, READ BACK BY AND VERIFIED WITH GASQUE, S. RN @0311  04/26/24 SATRAINR (NOTE) For medical purposes only. Performed at [facility], [address], [place], [place] [address]    WBC 04/26/2024 6.1  4.0 - 10.5 K/uL Final   RBC 04/26/2024 5.11  4.22 - 5.81 MIL/uL Final   Hemoglobin 04/26/2024 16.6  13.0 - 17.0 g/dL Final   HCT 92/81/7974 48.4  39.0 - 52.0 % Final   MCV 04/26/2024 94.7  80.0 - 100.0 fL Final   MCH 04/26/2024 32.5  26.0 - 34.0 pg Final   MCHC 04/26/2024 34.3  30.0 - 36.0 g/dL Final   RDW 92/81/7974 13.7  11.5 - 15.5 % Final   Platelets 04/26/2024 220  150 - 400 K/uL Final   nRBC 04/26/2024 0.0  0.0 - 0.2 % Final   Neutrophils Relative % 04/26/2024 48  % Final   Neutro  Abs 04/26/2024 2.9  1.7 - 7.7 K/uL Final   Lymphocytes Relative 04/26/2024 37  % Final   Lymphs Abs 04/26/2024 2.2  0.7 - 4.0 K/uL Final   Monocytes Relative 04/26/2024 11  % Final   Monocytes Absolute 04/26/2024 0.7  0.1 - 1.0 K/uL Final   Eosinophils Relative 04/26/2024 2  % Final   Eosinophils Absolute 04/26/2024 0.1  0.0 - 0.5 K/uL Final   Basophils Relative 04/26/2024 1  % Final   Basophils Absolute 04/26/2024 0.1  0.0 - 0.1 K/uL Final   Immature Granulocytes 04/26/2024 1  % Final   Abs Immature Granulocytes 04/26/2024 0.03  0.00 - 0.07 K/uL Final   Performed at [facility], [address] [place], [place] [address]   Opiates 04/26/2024 NONE DETECTED  NONE DETECTED Final   Cocaine 04/26/2024 NONE DETECTED  NONE DETECTED Final   Benzodiazepines 04/26/2024 POSITIVE (A)  NONE DETECTED Final   Amphetamines 04/26/2024 NONE DETECTED  NONE DETECTED Final   Tetrahydrocannabinol 04/26/2024 POSITIVE (A)  NONE DETECTED Final   Barbiturates 04/26/2024 NONE DETECTED  NONE DETECTED Final   Comment: (NOTE) DRUG SCREEN FOR MEDICAL PURPOSES ONLY.  IF CONFIRMATION IS NEEDED FOR ANY PURPOSE, NOTIFY LAB WITHIN 5 DAYS.  LOWEST DETECTABLE LIMITS FOR URINE DRUG SCREEN Drug Class                     Cutoff (ng/mL) Amphetamine and metabolites    1000 Barbiturate and metabolites    200 Benzodiazepine                 200 Opiates and metabolites        300 Cocaine and metabolites        300 THC                            50 Performed at [facility], [address], [place], [place] [address]    Lactic Acid, Venous 04/26/2024 4.2 (HH)  0.5 - 1.9 mmol/L  Final   Comment 04/26/2024 NOTIFIED PHYSICIAN   Final   Total CK 04/26/2024 432 (H)  49 - 397 U/L Final   Performed at [facility], [address], [place], [place] [address]   Lactic Acid, Venous 04/26/2024 3.4 (HH)  0.5 - 1.9 mmol/L Final   Comment 04/26/2024 NOTIFIED PHYSICIAN   Final  Admission on 04/18/2024, Discharged on  04/19/2024  Component Date Value Ref Range Status   WBC 04/18/2024 8.2  4.0 - 10.5 K/uL Final   RBC 04/18/2024 4.94  4.22 - 5.81 MIL/uL Final   Hemoglobin 04/18/2024 16.1  13.0 - 17.0 g/dL Final   HCT 92/89/7974 46.0  39.0 - 52.0 % Final   MCV 04/18/2024 93.1  80.0 - 100.0 fL Final   MCH 04/18/2024 32.6  26.0 - 34.0 pg Final   MCHC 04/18/2024 35.0  30.0 - 36.0 g/dL Final   RDW 92/89/7974 13.1  11.5 - 15.5 % Final   Platelets 04/18/2024 228  150 - 400 K/uL Final   nRBC 04/18/2024 0.0  0.0 - 0.2 % Final   Neutrophils Relative % 04/18/2024 48  % Final   Neutro Abs 04/18/2024 4.1  1.7 - 7.7 K/uL Final   Lymphocytes Relative 04/18/2024 34  % Final   Lymphs Abs 04/18/2024 2.8  0.7 - 4.0 K/uL Final   Monocytes Relative 04/18/2024 11  % Final   Monocytes Absolute 04/18/2024 0.9  0.1 - 1.0 K/uL Final   Eosinophils Relative 04/18/2024 3  % Final   Eosinophils Absolute 04/18/2024 0.2  0.0 - 0.5 K/uL Final   Basophils Relative 04/18/2024 2  % Final   Basophils Absolute 04/18/2024 0.1  0.0 - 0.1 K/uL Final   Immature Granulocytes 04/18/2024 2  % Final   Abs Immature Granulocytes 04/18/2024 0.15 (H)  0.00 - 0.07 K/uL Final   Performed at [facility], [address] [place], [place] [address]   Sodium 04/18/2024 138  135 - 145 mmol/L Final   Potassium 04/18/2024 3.6  3.5 - 5.1 mmol/L Final   HEMOLYSIS AT THIS LEVEL MAY AFFECT RESULT   Chloride 04/18/2024 101  98 - 111 mmol/L Final   CO2 04/18/2024 20 (L)  22 - 32 mmol/L Final   Glucose, Bld 04/18/2024 108 (H)  70 - 99 mg/dL Final   Glucose reference range applies only to samples taken after fasting for at least 8 hours.   BUN 04/18/2024 8  6 - 20 mg/dL Final   Creatinine, Ser 04/18/2024 0.86  0.61 - 1.24 mg/dL Final   Calcium  04/18/2024 9.5  8.9 - 10.3 mg/dL Final   Total Protein 92/89/7974 7.2  6.5 - 8.1 g/dL Final   Albumin 92/89/7974 4.3  3.5 - 5.0 g/dL Final   AST 92/89/7974 56 (H)  15 - 41 U/L Final   HEMOLYSIS AT THIS LEVEL MAY  AFFECT RESULT   ALT 04/18/2024 33  0 - 44 U/L Final   HEMOLYSIS AT THIS LEVEL MAY AFFECT RESULT   Alkaline Phosphatase 04/18/2024 52  38 - 126 U/L Final   Total Bilirubin 04/18/2024 1.2  0.0 - 1.2 mg/dL Final   HEMOLYSIS AT THIS LEVEL MAY AFFECT RESULT   GFR, Estimated 04/18/2024 >60  >60 mL/min Final   Comment: (NOTE) Calculated using the CKD-EPI Creatinine Equation (2021)    Anion gap 04/18/2024 17 (H)  5 - 15 Final   Performed at [facility], [address], [place], [place] [address]   Alcohol, Ethyl (B)  04/18/2024 309 (HH)  <15 mg/dL Final   Comment: FIRST CALL ATTEMPT AT 0205 CRITICAL RESULT CALLED TO, READ BACK BY AND VERIFIED WITH DOROTHA SPALDING RN [phone] M. ALAMANO (NOTE) For medical purposes only. Performed at [facility], [address], [place], [place] [address]    Opiates 04/18/2024 NONE DETECTED  NONE DETECTED Final   Cocaine 04/18/2024 NONE DETECTED  NONE DETECTED Final   Benzodiazepines 04/18/2024 POSITIVE (A)  NONE DETECTED Final   Amphetamines 04/18/2024 NONE DETECTED  NONE DETECTED Final   Tetrahydrocannabinol 04/18/2024 POSITIVE (A)  NONE DETECTED Final   Barbiturates 04/18/2024 NONE DETECTED  NONE DETECTED Final   Comment: (NOTE) DRUG SCREEN FOR MEDICAL PURPOSES ONLY.  IF CONFIRMATION IS NEEDED FOR ANY PURPOSE, NOTIFY LAB WITHIN 5 DAYS.  LOWEST DETECTABLE LIMITS FOR URINE DRUG SCREEN Drug Class                     Cutoff (ng/mL) Amphetamine and metabolites    1000 Barbiturate and metabolites    200 Benzodiazepine                 200 Opiates and metabolites        300 Cocaine and metabolites        300 THC                            50 Performed at [facility], [address], [place], [place] [address]    Prothrombin Time 04/18/2024 13.7  11.4 - 15.2 seconds Final   INR 04/18/2024 1.0  0.8 - 1.2 Final   Comment: (NOTE) INR goal varies based on device and disease states. Performed at [facility], [address]  [place], [place] [address]    Lipase 04/18/2024 37  11 - 51 U/L Final   Performed at [facility], [address] [place], [place] [address]   Ammonia 04/18/2024 26  9 - 35 umol/L Final   Performed at [facility], [address], [place], [place] [address]   aPTT 04/18/2024 28  24 - 36 seconds Final   Performed at [facility], [address], [place], [place] [address]   ABO/RH(D) 04/18/2024 O POS   Final   Antibody Screen 04/18/2024 NEG   Final   Sample Expiration 04/18/2024    Final                   Value:04/21/2024,2359 Performed at [facility], [address], [place], [place] [address]    HIV Screen 4th Generation wRfx 04/18/2024 Non Reactive  Non Reactive Final   Performed at [facility], [address] [place], [place] [address]   WBC 04/18/2024 8.2  4.0 - 10.5 K/uL Final   RBC 04/18/2024 4.50  4.22 - 5.81 MIL/uL Final   Hemoglobin 04/18/2024 14.6  13.0 - 17.0 g/dL Final   HCT 92/89/7974 42.3  39.0 - 52.0 % Final   MCV 04/18/2024 94.0  80.0 - 100.0 fL Final   MCH 04/18/2024 32.4  26.0 - 34.0 pg Final   MCHC 04/18/2024 34.5  30.0 - 36.0 g/dL Final   RDW 92/89/7974 13.1  11.5 - 15.5 % Final   Platelets 04/18/2024 181  150 - 400 K/uL Final   nRBC 04/18/2024 0.0  0.0 - 0.2 % Final   Performed at [facility], [address], [place], [place] [address]   ABO/RH(D) 04/18/2024    Final                   Value:O POS Performed at [facility], [address], [place], [place] [address]    WBC 04/18/2024 6.7  4.0 - 10.5 K/uL Final   RBC 04/18/2024 4.31  4.22 - 5.81 MIL/uL Final   Hemoglobin 04/18/2024 14.2  13.0 - 17.0 g/dL Final   HCT 92/89/7974 40.0  39.0 - 52.0 % Final   MCV 04/18/2024 92.8  80.0 - 100.0 fL Final   MCH 04/18/2024 32.9  26.0 - 34.0 pg Final   MCHC 04/18/2024 35.5  30.0 - 36.0 g/dL Final   RDW 92/89/7974 13.1  11.5 - 15.5 % Final   Platelets 04/18/2024 166  150 - 400 K/uL Final   nRBC 04/18/2024 0.0  0.0 - 0.2 % Final    Performed at [facility] Lab, [address] [place], [place] [address]   WBC 04/19/2024 6.1  4.0 - 10.5 K/uL Final   RBC 04/19/2024 4.38  4.22 - 5.81 MIL/uL Final   Hemoglobin 04/19/2024 14.4  13.0 - 17.0 g/dL Final   HCT 92/88/7974 41.2  39.0 - 52.0 % Final   MCV 04/19/2024 94.1  80.0 - 100.0 fL Final   MCH 04/19/2024 32.9  26.0 - 34.0 pg Final   MCHC 04/19/2024 35.0  30.0 - 36.0 g/dL Final   RDW 92/88/7974 13.0  11.5 - 15.5 % Final   Platelets 04/19/2024 161  150 - 400 K/uL Final   nRBC 04/19/2024 0.0  0.0 - 0.2 % Final   Neutrophils Relative % 04/19/2024 49  % Final   Neutro Abs 04/19/2024 3.0  1.7 - 7.7 K/uL Final   Lymphocytes Relative 04/19/2024 34  % Final   Lymphs Abs 04/19/2024 2.1  0.7 - 4.0 K/uL Final   Monocytes Relative 04/19/2024 12  % Final   Monocytes Absolute 04/19/2024 0.8  0.1 - 1.0 K/uL Final   Eosinophils Relative 04/19/2024 3  % Final   Eosinophils Absolute 04/19/2024 0.2  0.0 - 0.5 K/uL Final   Basophils Relative 04/19/2024 1  % Final   Basophils Absolute 04/19/2024 0.1  0.0 - 0.1 K/uL Final   Immature Granulocytes 04/19/2024 1  % Final   Abs Immature Granulocytes 04/19/2024 0.06  0.00 - 0.07 K/uL Final   Performed at [facility], [address] [place], [place] [address]   Sodium 04/19/2024 139  135 - 145 mmol/L Final   Potassium 04/19/2024 2.8 (L)  3.5 - 5.1 mmol/L Final   DELTA CHECK NOTED   Chloride 04/19/2024 102  98 - 111 mmol/L Final   CO2 04/19/2024 26  22 - 32 mmol/L Final   Glucose, Bld 04/19/2024 89  70 - 99 mg/dL Final   Glucose reference range applies only to samples taken after fasting for at least 8 hours.   BUN 04/19/2024 <5 (L)  6 - 20 mg/dL Final   Creatinine, Ser 04/19/2024 0.83  0.61 - 1.24 mg/dL Final   Calcium  04/19/2024 9.2  8.9 - 10.3 mg/dL Final   Total Protein 92/88/7974 6.0 (L)  6.5 - 8.1 g/dL Final   Albumin 92/88/7974 3.4 (L)  3.5 - 5.0 g/dL Final   AST 92/88/7974 31  15 - 41 U/L Final   ALT 04/19/2024 26  0 - 44  U/L Final   Alkaline Phosphatase 04/19/2024 38  38 - 126 U/L Final  Total Bilirubin 04/19/2024 2.4 (H)  0.0 - 1.2 mg/dL Final   GFR, Estimated 04/19/2024 >60  >60 mL/min Final   Comment: (NOTE) Calculated using the CKD-EPI Creatinine Equation (2021)    Anion gap 04/19/2024 11  5 - 15 Final   Performed at [facility], [address] [place], [place] [address]   Magnesium  04/19/2024 1.6 (L)  1.7 - 2.4 mg/dL Final   Performed at [facility], [address], [place], [place] [address]   MRSA by PCR Next Gen 04/18/2024 NOT DETECTED  NOT DETECTED Final   Comment: (NOTE) The GeneXpert MRSA Assay (FDA approved for NASAL specimens only), is one component of a comprehensive MRSA colonization surveillance program. It is not intended to diagnose MRSA infection nor to guide or monitor treatment for MRSA infections. Test performance is not FDA approved in patients less than 29 years old. Performed at [facility], [address], [place], [place] [address]    HCV Ab 04/18/2024 NON REACTIVE  NON REACTIVE Final   Comment: (NOTE) Nonreactive HCV antibody screen is consistent with no HCV infections,  unless recent infection is suspected or other evidence exists to indicate HCV infection.  Performed at [facility], [address], [place], [place] [address]    Hepatitis B Surface Ag 04/18/2024 NON REACTIVE  NON REACTIVE Final   Performed at [facility], [address], [place], [place] [address]   SURGICAL PATHOLOGY 04/19/2024    Final-Edited                   Value:SURGICAL PATHOLOGY CASE: [id number] PATIENT: Brett Sanford Surgical Pathology Report     Clinical History: hematemesis, coffee ground emesis, R/O H. Pylori (cm)     FINAL MICROSCOPIC DIAGNOSIS:  A. STOMACH, ANTRUM AND BODY, BIOPSY: - Gastric antral mucosa with mild nonspecific reactive gastropathy - Gastric oxyntic mucosa with no specific histopathologic changes - Helicobacter  pylori-like organisms are not identified on routine HE stain     GROSS DESCRIPTION:  Received in formalin are tan, soft tissue fragments that are submitted in toto. Number: 3 size: 0.2 cm, each blocks: 1 (KW, 04/19/2024)  Final Diagnosis performed by Katrine Muskrat, MD.   Electronically signed 04/22/2024 Technical component performed at [employer], [address] [place], [place] [address].  Professional component performed at [facility], [address] [place], [place] [address].  Immunohistochemistry Technical component (if [employer]) was performed at [employer]. [address], [place], [place] [address].   IMMUNOHISTOCHEMISTRY DISCLAIMER (if applicable): Some of these immunohistochemical stains may have been developed and the performance characteristics determine by [facility]. Some may not have been cleared or approved by the U.S. Food and Drug Administration. The FDA has determined that such clearance or approval is not necessary. This test is used for clinical purposes. It should not be regarded as investigational or for research. This laboratory is certified under the Clinical Laboratory Improvement Amendments of 1988 (CLIA-88) as qualified to perform high complexity clinical laboratory testing.  The controls stained appropriately.   IHC stains are performed on formalin fixed, paraffin embedded tissue using a 3,3diaminobenzidine (DAB) chromogen and Leica Bond Autostainer System. The staining intensity of the nucleus is score manually and is reported  as the percentage of tumor cell nuclei demonstrating specific nuclear staining. The specimens are fixed in 10% Neutral Formalin for at least 6 hours and up to 72hrs. These tests are validated on decalcified tissue. Results should be interpreted with caution given the possibility of  false negative results on decalcified specimens. Antibody Clones are as follows ER-clone 30F, PR-clone 16, Ki67- clone MM1. Some of these immunohistochemical stains may have been developed and the performance characteristics determined by [facility].   Admission on 03/31/2024, Discharged on 04/02/2024  Component Date Value Ref Range Status   Salicylate Lvl 03/31/2024 <7.0 (L)  7.0 - 30.0 mg/dL Final   Performed at [facility], [address] [place], [place] [address]   Acetaminophen  (Tylenol ), Serum 03/31/2024 <10 (L)  10 - 30 ug/mL Final   Comment: (NOTE) Therapeutic concentrations vary significantly. A range of 10-30 ug/mL  may be an effective concentration for many patients. However, some  are best treated at concentrations outside of this range. Acetaminophen  concentrations >150 ug/mL at 4 hours after ingestion  and >50 ug/mL at 12 hours after ingestion are often associated with  toxic reactions.  Performed at [facility], [address] [place], [place] [address]    Alcohol, Ethyl (B) 03/31/2024 130 (H)  <15 mg/dL Final   Comment: (NOTE) For medical purposes only. Performed at [facility], [address] [place], [place] [address]    Opiates 03/31/2024 NONE DETECTED  NONE DETECTED Final   Cocaine 03/31/2024 NONE DETECTED  NONE DETECTED Final   Benzodiazepines 03/31/2024 POSITIVE (A)  NONE DETECTED Final   Amphetamines 03/31/2024 NONE DETECTED  NONE DETECTED Final   Tetrahydrocannabinol 03/31/2024 POSITIVE (A)  NONE DETECTED Final   Barbiturates 03/31/2024 NONE DETECTED  NONE DETECTED Final   Comment: (NOTE) DRUG SCREEN FOR MEDICAL PURPOSES ONLY.  IF CONFIRMATION IS NEEDED FOR ANY PURPOSE, NOTIFY LAB WITHIN 5 DAYS.  LOWEST DETECTABLE LIMITS FOR URINE DRUG SCREEN Drug Class                     Cutoff (ng/mL) Amphetamine and metabolites    1000 Barbiturate and metabolites    200 Benzodiazepine                  200 Opiates and metabolites        300 Cocaine and metabolites        300 THC                            50 Performed at [facility], [address] [place], [place] [address]    WBC 03/31/2024 8.4  4.0 - 10.5 K/uL Final   RBC 03/31/2024 4.96  4.22 - 5.81 MIL/uL Final   Hemoglobin 03/31/2024 16.5  13.0 - 17.0 g/dL Final   HCT 93/77/7974 48.5  39.0 - 52.0 % Final   MCV 03/31/2024 97.8  80.0 - 100.0 fL Final   MCH 03/31/2024 33.3  26.0 - 34.0 pg Final   MCHC 03/31/2024 34.0  30.0 - 36.0 g/dL Final   RDW 93/77/7974 12.8  11.5 - 15.5 % Final   Platelets 03/31/2024 306  150 - 400 K/uL Final   nRBC 03/31/2024 0.0  0.0 - 0.2 % Final   Neutrophils Relative % 03/31/2024 58  % Final   Neutro Abs 03/31/2024 4.9  1.7 - 7.7 K/uL Final   Lymphocytes Relative 03/31/2024 32  % Final  Lymphs Abs 03/31/2024 2.7  0.7 - 4.0 K/uL Final   Monocytes Relative 03/31/2024 7  % Final   Monocytes Absolute 03/31/2024 0.6  0.1 - 1.0 K/uL Final   Eosinophils Relative 03/31/2024 1  % Final   Eosinophils Absolute 03/31/2024 0.1  0.0 - 0.5 K/uL Final   Basophils Relative 03/31/2024 1  % Final   Basophils Absolute 03/31/2024 0.1  0.0 - 0.1 K/uL Final   Immature Granulocytes 03/31/2024 1  % Final   Abs Immature Granulocytes 03/31/2024 0.09 (H)  0.00 - 0.07 K/uL Final   Performed at [facility], [address], [place], [place] [address]   Sodium 03/31/2024 137  135 - 145 mmol/L Final   Potassium 03/31/2024 3.5  3.5 - 5.1 mmol/L Final   Chloride 03/31/2024 103  98 - 111 mmol/L Final   CO2 03/31/2024 20 (L)  22 - 32 mmol/L Final   Glucose, Bld 03/31/2024 156 (H)  70 - 99 mg/dL Final   Glucose reference range applies only to samples taken after fasting for at least 8 hours.   BUN 03/31/2024 18  6 - 20 mg/dL Final   Creatinine, Ser 03/31/2024 0.92  0.61 - 1.24 mg/dL Final   Calcium  03/31/2024 8.8 (L)  8.9 - 10.3 mg/dL Final   Total Protein 93/77/7974 6.7  6.5 - 8.1  g/dL Final   Albumin 93/77/7974 3.8  3.5 - 5.0 g/dL Final   AST 93/77/7974 30  15 - 41 U/L Final   ALT 03/31/2024 24  0 - 44 U/L Final   Alkaline Phosphatase 03/31/2024 48  38 - 126 U/L Final   Total Bilirubin 03/31/2024 1.0  0.0 - 1.2 mg/dL Final   GFR, Estimated 03/31/2024 >60  >60 mL/min Final   Comment: (NOTE) Calculated using the CKD-EPI Creatinine Equation (2021)    Anion gap 03/31/2024 14  5 - 15 Final   Performed at [facility], [address], [place], [place] [address]  Admission on 03/11/2024, Discharged on 03/12/2024  Component Date Value Ref Range Status   WBC 03/11/2024 7.3  4.0 - 10.5 K/uL Final   RBC 03/11/2024 5.72  4.22 - 5.81 MIL/uL Final   Hemoglobin 03/11/2024 19.0 (H)  13.0 - 17.0 g/dL Final   HCT 93/97/7974 54.7 (H)  39.0 - 52.0 % Final   MCV 03/11/2024 95.6  80.0 - 100.0 fL Final   MCH 03/11/2024 33.2  26.0 - 34.0 pg Final   MCHC 03/11/2024 34.7  30.0 - 36.0 g/dL Final   RDW 93/97/7974 12.3  11.5 - 15.5 % Final   Platelets 03/11/2024 359  150 - 400 K/uL Final   nRBC 03/11/2024 0.0  0.0 - 0.2 % Final   Neutrophils Relative % 03/11/2024 52  % Final   Neutro Abs 03/11/2024 3.8  1.7 - 7.7 K/uL Final   Lymphocytes Relative 03/11/2024 35  % Final   Lymphs Abs 03/11/2024 2.6  0.7 - 4.0 K/uL Final   Monocytes Relative 03/11/2024 10  % Final   Monocytes Absolute 03/11/2024 0.7  0.1 - 1.0 K/uL Final   Eosinophils Relative 03/11/2024 1  % Final   Eosinophils Absolute 03/11/2024 0.1  0.0 - 0.5 K/uL Final   Basophils Relative 03/11/2024 1  % Final   Basophils Absolute 03/11/2024 0.1  0.0 - 0.1 K/uL Final   Immature Granulocytes 03/11/2024 1  % Final   Abs Immature Granulocytes 03/11/2024 0.05  0.00 - 0.07 K/uL Final   Performed at [facility], [address] [place], [place] [address]   Sodium 03/11/2024 137  135 - 145 mmol/L Final   Potassium 03/11/2024 3.8  3.5 - 5.1 mmol/L Final   Chloride 03/11/2024 95 (L)  98 - 111 mmol/L Final   CO2  03/11/2024 25  22 - 32 mmol/L Final   Glucose, Bld 03/11/2024 95  70 - 99 mg/dL Final   Glucose reference range applies only to samples taken after fasting for at least 8 hours.   BUN 03/11/2024 7  6 - 20 mg/dL Final   Creatinine, Ser 03/11/2024 1.10  0.61 - 1.24 mg/dL Final   Calcium  03/11/2024 10.4 (H)  8.9 - 10.3 mg/dL Final   Total Protein 93/97/7974 7.7  6.5 - 8.1 g/dL Final   Albumin 93/97/7974 4.5  3.5 - 5.0 g/dL Final   AST 93/97/7974 130 (H)  15 - 41 U/L Final   ALT 03/11/2024 109 (H)  0 - 44 U/L Final   Alkaline Phosphatase 03/11/2024 59  38 - 126 U/L Final   Total Bilirubin 03/11/2024 1.6 (H)  0.0 - 1.2 mg/dL Final   GFR, Estimated 03/11/2024 >60  >60 mL/min Final   Comment: (NOTE) Calculated using the CKD-EPI Creatinine Equation (2021)    Anion gap 03/11/2024 17 (H)  5 - 15 Final   Performed at [facility], [address], [place], [place] [address]   Alcohol, Ethyl (B) 03/11/2024 195 (H)  <15 mg/dL Final   Comment: (NOTE) For medical purposes only. Performed at [facility], [address], [place], [place] [address]    TSH 03/11/2024 2.348  0.350 - 4.500 uIU/mL Final   Comment: Performed by a 3rd Generation assay with a functional sensitivity of <=0.01 uIU/mL. Performed at [facility], [address] [place], [place] [address]    POC Amphetamine UR 03/11/2024 None Detected  NONE DETECTED (Cut Off Level 1000 ng/mL) Final   POC Secobarbital (BAR) 03/11/2024 None Detected  NONE DETECTED (Cut Off Level 300 ng/mL) Final   POC Buprenorphine (BUP) 03/11/2024 None Detected  NONE DETECTED (Cut Off Level 10 ng/mL) Final   POC Oxazepam (BZO) 03/11/2024 Positive (A)  NONE DETECTED (Cut Off Level 300 ng/mL) Final   POC Cocaine UR 03/11/2024 None Detected  NONE DETECTED (Cut Off Level 300 ng/mL) Final   POC Methamphetamine UR 03/11/2024 None Detected  NONE DETECTED (Cut Off Level 1000 ng/mL) Final   POC Morphine  03/11/2024 None Detected  NONE DETECTED (Cut Off  Level 300 ng/mL) Final   POC Methadone UR 03/11/2024 None Detected  NONE DETECTED (Cut Off Level 300 ng/mL) Final   POC Oxycodone UR 03/11/2024 None Detected  NONE DETECTED (Cut Off Level 100 ng/mL) Final   POC Marijuana UR 03/11/2024 Positive (A)  NONE DETECTED (Cut Off Level 50 ng/mL) Final  Admission on 02/25/2024, Discharged on 02/25/2024  Component Date Value Ref Range Status   Influenza A, POC 02/25/2024 Positive (A)  Negative Final   Influenza B, POC 02/25/2024 Negative  Negative Final    Allergies: Reglan  [metoclopramide ]  Medications:  Facility Ordered Medications  Medication   alum & mag hydroxide-simeth (MAALOX/MYLANTA) 200-200-20 MG/5ML suspension 30 mL   magnesium  hydroxide (MILK OF MAGNESIA) suspension 30 mL   OLANZapine  zydis (ZYPREXA ) disintegrating tablet 5 mg   OLANZapine  (ZYPREXA ) injection 5 mg   OLANZapine  (ZYPREXA ) injection 10 mg   [COMPLETED] thiamine  (VITAMIN B1) injection 100 mg   thiamine  (VITAMIN B1) tablet 100 mg   multivitamin with minerals  tablet 1 tablet   loperamide  (IMODIUM ) capsule 2-4 mg   ondansetron  (ZOFRAN -ODT) disintegrating tablet 4 mg   [COMPLETED] cloNIDine  (CATAPRES ) tablet 0.1 mg   propranolol  (INDERAL ) tablet 10 mg   [COMPLETED] LORazepam  (ATIVAN ) tablet 1 mg   Followed by   [COMPLETED] LORazepam  (ATIVAN ) tablet 1 mg   Followed by   LORazepam  (ATIVAN ) tablet 1 mg   Followed by   NOREEN ON 05/01/2024] LORazepam  (ATIVAN ) tablet 1 mg   pantoprazole  (PROTONIX ) EC tablet 20 mg   propranolol  (INDERAL ) tablet 10 mg   hydrOXYzine  (ATARAX ) tablet 50 mg   cloNIDine  (CATAPRES ) tablet 0.1 mg   ibuprofen  (ADVIL ) tablet 400 mg   PTA Medications  Medication Sig   propranolol  (INDERAL ) 10 MG tablet Take 1 tablet (10 mg total) by mouth every 6 (six) hours as needed for up to 15 days (anxiety).   pantoprazole  (PROTONIX ) 40 MG tablet Take 1 tablet (40 mg total) by mouth 2 (two) times daily before a meal.   thiamine  (VITAMIN B1) 100 MG tablet Take  1 tablet (100 mg total) by mouth daily.   sertraline  (ZOLOFT ) 50 MG tablet Take 1 tablet (50 mg total) by mouth daily. (Patient not taking: Reported on 04/18/2024)   Long Term Goals: Improvement in symptoms so as ready for discharge  Short Term Goals: Patient will verbalize feelings in meetings with treatment team members., Patient will attend at least of 50% of the groups daily., Pt will complete the PHQ9 on admission, day 3 and discharge., Patient will participate in completing the [place] Suicide Severity Rating Scale, Patient will score a low risk of violence for 24 hours prior to discharge, and Patient will take medications as prescribed daily.  Medical Decision Making  - Admitted to the facility based crisis center -Baseline labs ordered including hemoglobin A1c, TSH, lipid panel.  BAL 257 on admission.  Lactic acid was 3.4, we will recheck to ensure that it has triggered downwards.  We will also recheck CK which was 432 to 2 days ago. LFTs slightly elevated, but might be due to alcoholism.  We will educate patient to follow this and with his PCP at discharge.  Also urging the following lab: Vitamins B12, vitamin D . EKG WNL.   Patient on the following medications: -Start Remeron  7.5 mg nightly for depressive symptoms and sleep -Continue Clonidine  0.1 mg for SBP>160 or DBP>100 Continue: -Ativan  taper for alcohol use disorder-See MAR for detailed order -Hydroxyzine  25 mg 3 times daily as needed for anxiety -Inderal  10 mg 3 times daily for GAD/tachycardia/hypertension -Agitation protocol: Ativan /Benadryl/Haldol  as needed-Please see MAR for detailed order -Oral thiamine  and MVI replacement  Recommendations  - Abstinence from substances encouraged -SW to look into options for outpatient SA treatment at discharge  -Monitor for signs of withdrawal via CIWAs as ordered -Q15 minute checks as per unit protocol -Attend unit group sessions as mandated -72 hr notice required if needing to be  discharged  Donia Snell, NP 04/29/24  2:23 PM

## 2024-04-29 NOTE — ED Notes (Signed)
 Patient is in the Dayroom calm and composed watching TV with other patients. NAD. Denies needing anything atm.  Respirations even and unlabored. Environment secured per policy. Will keep monitoring for safety.

## 2024-04-29 NOTE — Discharge Planning (Signed)
 LCSW met with patient to assess current mood, affect, physical state, and inquire about needs/goals while here in [facility] and after discharge. Patient reports he presented to [facility] for alcohol detox. Patient reports he was drinking 1/5 of liquor a day since around age 33 and stated it had increased over the past 3 years.   Patient reported to be living with his mother and was here for detox a few weeks ago and then went to [facility] for outpatient SAIOP for initial assessment and did not follow back up. He stated that he has been coping with his mother having a new dx of dementia and caring for her.   Patient stated he does have transportation and wants to follow up with [facility] again for their SAIOP and plans to eventually do a residential when he has things better situated with his mother.  Patient denies any prior history of outpatient or inpatient substance abuse treatment. Patient currently denies any SI/HI/AVH.  Patient expressed understanding and appreciation of LCSW assistance. No other needs were reported at this time by patient.

## 2024-04-29 NOTE — ED Notes (Signed)
Patient is awake

## 2024-04-29 NOTE — ED Notes (Signed)
 Patient is in the bedroom calm and and sleeping. NAD.  Will monitor for safety

## 2024-04-30 DIAGNOSIS — I10 Essential (primary) hypertension: Secondary | ICD-10-CM | POA: Diagnosis not present

## 2024-04-30 DIAGNOSIS — F411 Generalized anxiety disorder: Secondary | ICD-10-CM | POA: Diagnosis not present

## 2024-04-30 DIAGNOSIS — F1220 Cannabis dependence, uncomplicated: Secondary | ICD-10-CM | POA: Diagnosis not present

## 2024-04-30 DIAGNOSIS — F332 Major depressive disorder, recurrent severe without psychotic features: Secondary | ICD-10-CM | POA: Diagnosis not present

## 2024-04-30 NOTE — ED Notes (Signed)
 Pt sitting in dayroom watching television and interacting with peers. No acute distress noted. No concerns voiced. Informed pt to notify staff with any needs or assistance. Pt verbalized understanding and agreement. Will continue to monitor for safety.

## 2024-04-30 NOTE — Group Note (Signed)
 Group Topic: Change and Accountability  Group Date: 04/30/2024 Start Time: 0800 End Time: 0900 Facilitators: Lonzell Dwayne RAMAN, NT  Department: [facility]  Number of Participants: 7  Group Focus: acceptance Treatment Modality:  Behavior Modification Therapy Interventions utilized were problem solving Purpose: increase insight  Name: Brett Sanford Date of Birth: [birth year]  MR: [id number]    Level of Participation: Patient attended groupactive Quality of Participation: attentive Interactions with others: gave feedback Mood/Affect: positive Triggers (if applicable): N/A Cognition: coherent/clear Progress: Moderate Response: Appropriate Plan: patient will be encouraged to be positive in his future plans and not to give up.  Patients Problems:  Patient Active Problem List   Diagnosis Date Noted   Alcohol use disorder 04/27/2024   Gastroesophageal reflux disease with esophagitis and hemorrhage 04/19/2024   Gastritis without bleeding 04/19/2024   Hematemesis 04/18/2024   Syncope 04/18/2024   LFT elevation 04/18/2024   Alcohol abuse 04/02/2024   Substance abuse (HCC) 04/01/2024   Alcohol use disorder, severe, dependence (HCC) 03/12/2024   MDD (major depressive disorder), recurrent episode, severe (HCC) 08/02/2023   Drug overdose, intentional self-harm, initial encounter (HCC) 10/07/2019   Alcoholic intoxication without complication (HCC)    Suicide attempt (HCC)    Viral illness 08/21/2019   Hypokalemia 06/23/2019   Attention deficit hyperactivity disorder (ADHD) 06/23/2019   Seizure-like activity (HCC) 06/23/2019   History of alcohol abuse 06/23/2019   Chronic migraine 06/23/2019   Diarrhea 06/23/2019

## 2024-04-30 NOTE — ED Notes (Signed)
 Patient asleep in bed. NAD. Will keep monitoring for safety.

## 2024-04-30 NOTE — Group Note (Signed)
 Group Topic: Relapse and Recovery  Group Date: 04/29/2024 Start Time: 1205 End Time: 1230 Facilitators: Stanly Stabile, RN  Department: [facility]  Number of Participants: 5  Group Focus: check in, chemical dependency education, and chemical dependency issues Treatment Modality:  Behavior Modification Therapy Interventions utilized were clarification, exploration, patient education, and problem solving Purpose: enhance coping skills, explore maladaptive thinking, express feelings, express irrational fears, improve communication skills, increase insight, regain self-worth, reinforce self-care, and relapse prevention strategies  Name: Brett Sanford Date of Birth: 10-Nov-1990  MR: [id number]    Level of Participation: moderate Quality of Participation: attentive and cooperative Interactions with others: gave feedback Mood/Affect: appropriate Triggers (if applicable):   Cognition: coherent/clear Progress: Gaining insight Response:   Plan: follow-up needed  Patients Problems:  Patient Active Problem List   Diagnosis Date Noted   Alcohol use disorder 04/27/2024   Gastroesophageal reflux disease with esophagitis and hemorrhage 04/19/2024   Gastritis without bleeding 04/19/2024   Hematemesis 04/18/2024   Syncope 04/18/2024   LFT elevation 04/18/2024   Alcohol abuse 04/02/2024   Substance abuse (HCC) 04/01/2024   Alcohol use disorder, severe, dependence (HCC) 03/12/2024   MDD (major depressive disorder), recurrent episode, severe (HCC) 08/02/2023   Drug overdose, intentional self-harm, initial encounter (HCC) 10/07/2019   Alcoholic intoxication without complication (HCC)    Suicide attempt (HCC)    Viral illness 08/21/2019   Hypokalemia 06/23/2019   Attention deficit hyperactivity disorder (ADHD) 06/23/2019   Seizure-like activity (HCC) 06/23/2019   History of alcohol abuse 06/23/2019   Chronic migraine 06/23/2019   Diarrhea 06/23/2019

## 2024-04-30 NOTE — ED Provider Notes (Signed)
 Behavioral Health Progress Note   Date: 04/30/24 Patient Name: Brett Sanford MRN: [id number] Chief Complaint: Worsening alcohol & THC dependency  Diagnoses:  Final diagnoses:  Alcohol use disorder  GAD (generalized anxiety disorder)  Severe episode of recurrent major depressive disorder, without psychotic features (HCC)   HPI: Brett Sanford is a 33 y.o. Caucasian male with a reported prior mental health history of MDD, ADHD, GAD & alcohol use d/o who presented to the [facility] on 06/28/24 seeking assistance with regaining his sobriety from alcohol. Pt reported during assessment that his last drink consisted of a pint of Vodka prior to presenting at this urgent care. Patient was admitted to the [facility] here at the [facility] behavioral heath center with a goal of detoxification from alcohol, and possible rehabilitation placement thereafter.  Assessment, 04/30/2024: On assessment today, the pt reports that their mood is euthymic, improved since admission, and stable. Denies feeling down, depressed, or sad.  Reports that anxiety symptoms are at manageable level.  Sleep is stable. Appetite is stable.  Concentration is without complaint.  Energy level is adequate. Denies having any suicidal thoughts. Denies having any suicidal intent and plan.  Denies having any HI.  Denies having psychotic symptoms. Denies AVH, denies paranoia and denies delusional thinking.  Denies having side effects to current psychiatric medications. We discussed keeping medications same today with no changes.  Ativan  taper ends on 07/23, and pt's projected discharge date will be same day of 07/23. He has no withdrawal symptoms currently, and we will plan to discharge tomorrow pending CSW getting patient into an IOP substance abuse treatment program. He is not willing to go inpatient.  Labs Reviewed: Ordered repeat CK, Vit D, Vit B12, and labs are still pending.  PHQ 2-9:  Flowsheet Row ED  from 04/27/2024 in [facility] Most recent reading at 04/28/2024  3:02 PM ED from 04/27/2024 in [facility] Most recent reading at 04/27/2024  9:13 AM ED from 04/02/2024 in [facility] Most recent reading at 04/03/2024  9:58 AM  Thoughts that you would be better off dead, or of hurting yourself in some way Several days Not at all Several days  PHQ-9 Total Score 9 7 11     Flowsheet Row ED from 04/27/2024 in [facility] Most recent reading at 04/27/2024 10:15 AM ED from 04/27/2024 in [facility] Most recent reading at 04/27/2024  9:13 AM ED from 04/26/2024 in [facility] Emergency Department at [facility] Most recent reading at 04/26/2024  1:09 AM  C-SSRS RISK CATEGORY High Risk Error: Question 2 not populated Error: Q3, 4, or 5 should not be populated when Q2 is No    Screenings    Flowsheet Row Most Recent Value  CIWA-Ar Total 0    Total Time spent with patient: 1.5 hours  Musculoskeletal  Strength & Muscle Tone: within normal limits Gait & Station: normal Patient leans: N/A  Psychiatric Specialty Exam  Presentation General Appearance:  Fairly Groomed  Eye Contact: Fair  Speech: Clear and Coherent  Speech Volume: Normal  Handedness: Right   Mood and Affect  Mood: Euthymic  Affect: Congruent   Thought Process  Thought Processes: Coherent  Descriptions of Associations:Intact  Orientation:Full (Time, Place and Person)  Thought Content:Logical  Diagnosis of Schizophrenia or Schizoaffective disorder in past: No   Hallucinations:Hallucinations: None  Ideas of Reference:None  Suicidal Thoughts:Suicidal Thoughts: No  Homicidal Thoughts:Homicidal  Thoughts: No   Sensorium  Memory: Immediate Good  Judgment: Good  Insight: Good   Executive Functions  Concentration: Good  Attention  Span: Good  Recall: Good  Fund of Knowledge: Good  Language: Good   Psychomotor Activity  Psychomotor Activity: Psychomotor Activity: Normal   Assets  Assets: Resilience; Social Support   Sleep  Sleep: Sleep: Good   No data recorded   Physical Exam Constitutional:      Appearance: Normal appearance.  Musculoskeletal:        General: Normal range of motion.     Cervical back: Normal range of motion.  Neurological:     General: No focal deficit present.     Mental Status: He is alert and oriented to person, place, and time.    Review of Systems  Psychiatric/Behavioral:  Positive for depression and substance abuse. Negative for hallucinations, memory loss and suicidal ideas. The patient is nervous/anxious and has insomnia.   All other systems reviewed and are negative.   Blood pressure 132/77, pulse 83, temperature 98.3 F (36.8 C), temperature source Oral, resp. rate 18, SpO2 98%. There is no height or weight on file to calculate BMI.  Is the patient at risk to self? Yes -With worsening alcohol consumption Has the patient been a risk to self in the past 6 months? Yes .    Has the patient been a risk to self within the distant past? No   Is the patient a risk to others? No   Has the patient been a risk to others in the past 6 months? No   Has the patient been a risk to others within the distant past? No   Past Medical History: GERD Last Labs:  Admission on 04/27/2024  Component Date Value Ref Range Status   Color, Urine 04/27/2024 AMBER (A)  YELLOW Final   BIOCHEMICALS MAY BE AFFECTED BY COLOR   APPearance 04/27/2024 CLEAR  CLEAR Final   Specific Gravity, Urine 04/27/2024 1.023  1.005 - 1.030 Final   pH 04/27/2024 5.0  5.0 - 8.0 Final   Glucose, UA 04/27/2024 NEGATIVE  NEGATIVE mg/dL Final   Hgb urine dipstick 04/27/2024 NEGATIVE  NEGATIVE Final   Bilirubin Urine 04/27/2024 NEGATIVE  NEGATIVE Final   Ketones, ur 04/27/2024 5 (A)  NEGATIVE mg/dL Final    Protein, ur 92/80/7974 NEGATIVE  NEGATIVE mg/dL Final   Nitrite 92/80/7974 NEGATIVE  NEGATIVE Final   Leukocytes,Ua 04/27/2024 NEGATIVE  NEGATIVE Final   Performed at [facility], [address] [place], [place] [address]   POC Amphetamine UR 04/27/2024 None Detected  NONE DETECTED (Cut Off Level 1000 ng/mL) Final   POC Secobarbital (BAR) 04/27/2024 None Detected  NONE DETECTED (Cut Off Level 300 ng/mL) Final   POC Buprenorphine (BUP) 04/27/2024 None Detected  NONE DETECTED (Cut Off Level 10 ng/mL) Final   POC Oxazepam (BZO) 04/27/2024 Positive (A)  NONE DETECTED (Cut Off Level 300 ng/mL) Final   POC Cocaine UR 04/27/2024 None Detected  NONE DETECTED (Cut Off Level 300 ng/mL) Final   POC Methamphetamine UR 04/27/2024 None Detected  NONE DETECTED (Cut Off Level 1000 ng/mL) Final   POC Morphine  04/27/2024 None Detected  NONE DETECTED (Cut Off Level 300 ng/mL) Final   POC Methadone UR 04/27/2024 None Detected  NONE DETECTED (Cut Off Level 300 ng/mL) Final   POC Oxycodone UR 04/27/2024 None Detected  NONE DETECTED (Cut Off Level 100 ng/mL) Final   POC Marijuana UR 04/27/2024 Positive (A)  NONE DETECTED (Cut  Off Level 50 ng/mL) Final  Admission on 04/27/2024, Discharged on 04/27/2024  Component Date Value Ref Range Status   WBC 04/27/2024 5.5  4.0 - 10.5 K/uL Final   RBC 04/27/2024 5.13  4.22 - 5.81 MIL/uL Final   Hemoglobin 04/27/2024 16.5  13.0 - 17.0 g/dL Final   HCT 92/80/7974 47.9  39.0 - 52.0 % Final   MCV 04/27/2024 93.4  80.0 - 100.0 fL Final   MCH 04/27/2024 32.2  26.0 - 34.0 pg Final   MCHC 04/27/2024 34.4  30.0 - 36.0 g/dL Final   RDW 92/80/7974 13.8  11.5 - 15.5 % Final   Platelets 04/27/2024 224  150 - 400 K/uL Final   nRBC 04/27/2024 0.0  0.0 - 0.2 % Final   Neutrophils Relative % 04/27/2024 59  % Final   Neutro Abs 04/27/2024 3.3  1.7 - 7.7 K/uL Final   Lymphocytes Relative 04/27/2024 27  % Final   Lymphs Abs 04/27/2024 1.5  0.7 - 4.0 K/uL Final   Monocytes Relative  04/27/2024 10  % Final   Monocytes Absolute 04/27/2024 0.5  0.1 - 1.0 K/uL Final   Eosinophils Relative 04/27/2024 2  % Final   Eosinophils Absolute 04/27/2024 0.1  0.0 - 0.5 K/uL Final   Basophils Relative 04/27/2024 1  % Final   Basophils Absolute 04/27/2024 0.1  0.0 - 0.1 K/uL Final   Immature Granulocytes 04/27/2024 1  % Final   Abs Immature Granulocytes 04/27/2024 0.03  0.00 - 0.07 K/uL Final   Performed at [facility], [address] [place], [place] [address]   Sodium 04/27/2024 139  135 - 145 mmol/L Final   Potassium 04/27/2024 3.9  3.5 - 5.1 mmol/L Final   Chloride 04/27/2024 99  98 - 111 mmol/L Final   CO2 04/27/2024 20 (L)  22 - 32 mmol/L Final   Glucose, Bld 04/27/2024 72  70 - 99 mg/dL Final   Glucose reference range applies only to samples taken after fasting for at least 8 hours.   BUN 04/27/2024 8  6 - 20 mg/dL Final   Creatinine, Ser 04/27/2024 0.87  0.61 - 1.24 mg/dL Final   Calcium  04/27/2024 9.0  8.9 - 10.3 mg/dL Final   Total Protein 92/80/7974 6.7  6.5 - 8.1 g/dL Final   Albumin 92/80/7974 4.1  3.5 - 5.0 g/dL Final   AST 92/80/7974 68 (H)  15 - 41 U/L Final   ALT 04/27/2024 49 (H)  0 - 44 U/L Final   Alkaline Phosphatase 04/27/2024 55  38 - 126 U/L Final   Total Bilirubin 04/27/2024 1.6 (H)  0.0 - 1.2 mg/dL Final   GFR, Estimated 04/27/2024 >60  >60 mL/min Final   Comment: (NOTE) Calculated using the CKD-EPI Creatinine Equation (2021)    Anion gap 04/27/2024 20 (H)  5 - 15 Final   Performed at [facility], [address] [place], [place] [address]   Hgb A1c MFr Bld 04/27/2024 4.7 (L)  4.8 - 5.6 % Final   Comment: (NOTE) Diagnosis of Diabetes The following HbA1c ranges recommended by the American Diabetes Association (ADA) may be used as an aid in the diagnosis of diabetes mellitus.  Hemoglobin             Suggested A1C NGSP%              Diagnosis  <5.7                   Non Diabetic  5.7-6.4                Pre-Diabetic  >6.4                    Diabetic  <7.0                   Glycemic control for                       adults with diabetes.     Mean Plasma Glucose 04/27/2024 88.19  mg/dL Final   Performed at [facility], [address] [place], [place] [address]   Magnesium  04/27/2024 1.7  1.7 - 2.4 mg/dL Final   Performed at [facility], [address], [place], [place] [address]   Alcohol, Ethyl (B) 04/27/2024 257 (H)  <15 mg/dL Final   Comment: (NOTE) For medical purposes only. Performed at [facility], [address] [place], [place] [address]    RPR Ser Ql 04/27/2024 NON REACTIVE  NON REACTIVE Final   Performed at [facility], [address], [place], [place] [address]  Admission on 04/26/2024, Discharged on 04/26/2024  Component Date Value Ref Range Status   Sodium 04/26/2024 140  135 - 145 mmol/L Final   Potassium 04/26/2024 3.6  3.5 - 5.1 mmol/L Final   Chloride 04/26/2024 104  98 - 111 mmol/L Final   CO2 04/26/2024 19 (L)  22 - 32 mmol/L Final   Glucose, Bld 04/26/2024 87  70 - 99 mg/dL Final   Glucose reference range applies only to samples taken after fasting for at least 8 hours.   BUN 04/26/2024 12  6 - 20 mg/dL Final   Creatinine, Ser 04/26/2024 0.94  0.61 - 1.24 mg/dL Final   Calcium  04/26/2024 8.7 (L)  8.9 - 10.3 mg/dL Final   Total Protein 92/81/7974 6.6  6.5 - 8.1 g/dL Final   Albumin 92/81/7974 3.7  3.5 - 5.0 g/dL Final   AST 92/81/7974 56 (H)  15 - 41 U/L Final   ALT 04/26/2024 42  0 - 44 U/L Final   Alkaline Phosphatase 04/26/2024 55  38 - 126 U/L Final   Total Bilirubin 04/26/2024 0.9  0.0 - 1.2 mg/dL Final   GFR, Estimated 04/26/2024 >60  >60 mL/min Final   Comment: (NOTE) Calculated using the CKD-EPI Creatinine Equation (2021)    Anion gap 04/26/2024 17 (H)  5 - 15 Final   Performed at [facility], [address] [place], [place] [address]   Alcohol, Ethyl (B) 04/26/2024 370 (HH)  <15 mg/dL Final   Comment: CRITICAL RESULT CALLED TO, READ BACK BY  AND VERIFIED WITH GASQUE, S. RN @0311  04/26/24 SATRAINR (NOTE) For medical purposes only. Performed at [facility], [address] [place], [place] [address]    WBC 04/26/2024 6.1  4.0 - 10.5 K/uL Final   RBC 04/26/2024 5.11  4.22 - 5.81 MIL/uL Final   Hemoglobin 04/26/2024 16.6  13.0 - 17.0 g/dL Final   HCT 92/81/7974 48.4  39.0 - 52.0 % Final   MCV 04/26/2024 94.7  80.0 - 100.0 fL Final   MCH 04/26/2024 32.5  26.0 - 34.0 pg Final   MCHC 04/26/2024 34.3  30.0 - 36.0 g/dL Final   RDW 92/81/7974 13.7  11.5 - 15.5 % Final   Platelets 04/26/2024 220  150 - 400 K/uL Final   nRBC 04/26/2024 0.0  0.0 -  0.2 % Final   Neutrophils Relative % 04/26/2024 48  % Final   Neutro Abs 04/26/2024 2.9  1.7 - 7.7 K/uL Final   Lymphocytes Relative 04/26/2024 37  % Final   Lymphs Abs 04/26/2024 2.2  0.7 - 4.0 K/uL Final   Monocytes Relative 04/26/2024 11  % Final   Monocytes Absolute 04/26/2024 0.7  0.1 - 1.0 K/uL Final   Eosinophils Relative 04/26/2024 2  % Final   Eosinophils Absolute 04/26/2024 0.1  0.0 - 0.5 K/uL Final   Basophils Relative 04/26/2024 1  % Final   Basophils Absolute 04/26/2024 0.1  0.0 - 0.1 K/uL Final   Immature Granulocytes 04/26/2024 1  % Final   Abs Immature Granulocytes 04/26/2024 0.03  0.00 - 0.07 K/uL Final   Performed at [facility], [address], [place], [place] [address]   Opiates 04/26/2024 NONE DETECTED  NONE DETECTED Final   Cocaine 04/26/2024 NONE DETECTED  NONE DETECTED Final   Benzodiazepines 04/26/2024 POSITIVE (A)  NONE DETECTED Final   Amphetamines 04/26/2024 NONE DETECTED  NONE DETECTED Final   Tetrahydrocannabinol 04/26/2024 POSITIVE (A)  NONE DETECTED Final   Barbiturates 04/26/2024 NONE DETECTED  NONE DETECTED Final   Comment: (NOTE) DRUG SCREEN FOR MEDICAL PURPOSES ONLY.  IF CONFIRMATION IS NEEDED FOR ANY PURPOSE, NOTIFY LAB WITHIN 5 DAYS.  LOWEST DETECTABLE LIMITS FOR URINE DRUG SCREEN Drug Class                     Cutoff  (ng/mL) Amphetamine and metabolites    1000 Barbiturate and metabolites    200 Benzodiazepine                 200 Opiates and metabolites        300 Cocaine and metabolites        300 THC                            50 Performed at [facility], [address] [place], [place] [address]    Lactic Acid, Venous 04/26/2024 4.2 (HH)  0.5 - 1.9 mmol/L Final   Comment 04/26/2024 NOTIFIED PHYSICIAN   Final   Total CK 04/26/2024 432 (H)  49 - 397 U/L Final   Performed at [facility], [address] [place], [place] [address]   Lactic Acid, Venous 04/26/2024 3.4 (HH)  0.5 - 1.9 mmol/L Final   Comment 04/26/2024 NOTIFIED PHYSICIAN   Final  Admission on 04/18/2024, Discharged on 04/19/2024  Component Date Value Ref Range Status   WBC 04/18/2024 8.2  4.0 - 10.5 K/uL Final   RBC 04/18/2024 4.94  4.22 - 5.81 MIL/uL Final   Hemoglobin 04/18/2024 16.1  13.0 - 17.0 g/dL Final   HCT 92/89/7974 46.0  39.0 - 52.0 % Final   MCV 04/18/2024 93.1  80.0 - 100.0 fL Final   MCH 04/18/2024 32.6  26.0 - 34.0 pg Final   MCHC 04/18/2024 35.0  30.0 - 36.0 g/dL Final   RDW 92/89/7974 13.1  11.5 - 15.5 % Final   Platelets 04/18/2024 228  150 - 400 K/uL Final   nRBC 04/18/2024 0.0  0.0 - 0.2 % Final   Neutrophils Relative % 04/18/2024 48  % Final   Neutro Abs 04/18/2024 4.1  1.7 - 7.7 K/uL Final   Lymphocytes Relative 04/18/2024 34  % Final   Lymphs Abs 04/18/2024 2.8  0.7 - 4.0 K/uL Final  Monocytes Relative 04/18/2024 11  % Final   Monocytes Absolute 04/18/2024 0.9  0.1 - 1.0 K/uL Final   Eosinophils Relative 04/18/2024 3  % Final   Eosinophils Absolute 04/18/2024 0.2  0.0 - 0.5 K/uL Final   Basophils Relative 04/18/2024 2  % Final   Basophils Absolute 04/18/2024 0.1  0.0 - 0.1 K/uL Final   Immature Granulocytes 04/18/2024 2  % Final   Abs Immature Granulocytes 04/18/2024 0.15 (H)  0.00 - 0.07 K/uL Final   Performed at [facility], [address], [place], [place] [address]   Sodium  04/18/2024 138  135 - 145 mmol/L Final   Potassium 04/18/2024 3.6  3.5 - 5.1 mmol/L Final   HEMOLYSIS AT THIS LEVEL MAY AFFECT RESULT   Chloride 04/18/2024 101  98 - 111 mmol/L Final   CO2 04/18/2024 20 (L)  22 - 32 mmol/L Final   Glucose, Bld 04/18/2024 108 (H)  70 - 99 mg/dL Final   Glucose reference range applies only to samples taken after fasting for at least 8 hours.   BUN 04/18/2024 8  6 - 20 mg/dL Final   Creatinine, Ser 04/18/2024 0.86  0.61 - 1.24 mg/dL Final   Calcium  04/18/2024 9.5  8.9 - 10.3 mg/dL Final   Total Protein 92/89/7974 7.2  6.5 - 8.1 g/dL Final   Albumin 92/89/7974 4.3  3.5 - 5.0 g/dL Final   AST 92/89/7974 56 (H)  15 - 41 U/L Final   HEMOLYSIS AT THIS LEVEL MAY AFFECT RESULT   ALT 04/18/2024 33  0 - 44 U/L Final   HEMOLYSIS AT THIS LEVEL MAY AFFECT RESULT   Alkaline Phosphatase 04/18/2024 52  38 - 126 U/L Final   Total Bilirubin 04/18/2024 1.2  0.0 - 1.2 mg/dL Final   HEMOLYSIS AT THIS LEVEL MAY AFFECT RESULT   GFR, Estimated 04/18/2024 >60  >60 mL/min Final   Comment: (NOTE) Calculated using the CKD-EPI Creatinine Equation (2021)    Anion gap 04/18/2024 17 (H)  5 - 15 Final   Performed at [facility], [address], [place], [place] [address]   Alcohol, Ethyl (B) 04/18/2024 309 (HH)  <15 mg/dL Final   Comment: FIRST CALL ATTEMPT AT 0205 CRITICAL RESULT CALLED TO, READ BACK BY AND VERIFIED WITH DOROTHA SPALDING RN [phone] M. ALAMANO (NOTE) For medical purposes only. Performed at [facility], [address], [place], [place] [address]    Opiates 04/18/2024 NONE DETECTED  NONE DETECTED Final   Cocaine 04/18/2024 NONE DETECTED  NONE DETECTED Final   Benzodiazepines 04/18/2024 POSITIVE (A)  NONE DETECTED Final   Amphetamines 04/18/2024 NONE DETECTED  NONE DETECTED Final   Tetrahydrocannabinol 04/18/2024 POSITIVE (A)  NONE DETECTED Final   Barbiturates 04/18/2024 NONE DETECTED  NONE DETECTED Final   Comment: (NOTE) DRUG SCREEN FOR MEDICAL  PURPOSES ONLY.  IF CONFIRMATION IS NEEDED FOR ANY PURPOSE, NOTIFY LAB WITHIN 5 DAYS.  LOWEST DETECTABLE LIMITS FOR URINE DRUG SCREEN Drug Class                     Cutoff (ng/mL) Amphetamine and metabolites    1000 Barbiturate and metabolites    200 Benzodiazepine                 200 Opiates and metabolites        300 Cocaine and metabolites        300 THC  50 Performed at [facility], [address] [place], [place] [address]    Prothrombin Time 04/18/2024 13.7  11.4 - 15.2 seconds Final   INR 04/18/2024 1.0  0.8 - 1.2 Final   Comment: (NOTE) INR goal varies based on device and disease states. Performed at [facility], [address], [place], [place] [address]    Lipase 04/18/2024 37  11 - 51 U/L Final   Performed at [facility], [address], [place], [place] [address]   Ammonia 04/18/2024 26  9 - 35 umol/L Final   Performed at [facility], [address] [place], [place] [address]   aPTT 04/18/2024 28  24 - 36 seconds Final   Performed at [facility], [address] [place], [place] [address]   ABO/RH(D) 04/18/2024 O POS   Final   Antibody Screen 04/18/2024 NEG   Final   Sample Expiration 04/18/2024    Final                   Value:04/21/2024,2359 Performed at [facility], [address] [place], [place] [address]    HIV Screen 4th Generation wRfx 04/18/2024 Non Reactive  Non Reactive Final   Performed at [facility], [address], [place], [place] [address]   WBC 04/18/2024 8.2  4.0 - 10.5 K/uL Final   RBC 04/18/2024 4.50  4.22 - 5.81 MIL/uL Final   Hemoglobin 04/18/2024 14.6  13.0 - 17.0 g/dL Final   HCT 92/89/7974 42.3  39.0 - 52.0 % Final   MCV 04/18/2024 94.0  80.0 - 100.0 fL Final   MCH 04/18/2024 32.4  26.0 - 34.0 pg Final   MCHC 04/18/2024 34.5  30.0 - 36.0 g/dL Final   RDW 92/89/7974 13.1  11.5 - 15.5 % Final   Platelets 04/18/2024 181  150 - 400 K/uL Final   nRBC 04/18/2024  0.0  0.0 - 0.2 % Final   Performed at [facility], [address] [place], [place] [address]   ABO/RH(D) 04/18/2024    Final                   Value:O POS Performed at [facility], [address], [place], [place] [address]    WBC 04/18/2024 6.7  4.0 - 10.5 K/uL Final   RBC 04/18/2024 4.31  4.22 - 5.81 MIL/uL Final   Hemoglobin 04/18/2024 14.2  13.0 - 17.0 g/dL Final   HCT 92/89/7974 40.0  39.0 - 52.0 % Final   MCV 04/18/2024 92.8  80.0 - 100.0 fL Final   MCH 04/18/2024 32.9  26.0 - 34.0 pg Final   MCHC 04/18/2024 35.5  30.0 - 36.0 g/dL Final   RDW 92/89/7974 13.1  11.5 - 15.5 % Final   Platelets 04/18/2024 166  150 - 400 K/uL Final   nRBC 04/18/2024 0.0  0.0 - 0.2 % Final   Performed at [facility] Lab, [address], [place], [place] [address]   WBC 04/19/2024 6.1  4.0 - 10.5 K/uL Final   RBC 04/19/2024 4.38  4.22 - 5.81 MIL/uL Final   Hemoglobin 04/19/2024 14.4  13.0 - 17.0 g/dL Final   HCT 92/88/7974 41.2  39.0 - 52.0 % Final   MCV 04/19/2024 94.1  80.0 - 100.0 fL Final   MCH 04/19/2024 32.9  26.0 - 34.0 pg Final   MCHC 04/19/2024 35.0  30.0 - 36.0 g/dL  Final   RDW 04/19/2024 13.0  11.5 - 15.5 % Final   Platelets 04/19/2024 161  150 - 400 K/uL Final   nRBC 04/19/2024 0.0  0.0 - 0.2 % Final   Neutrophils Relative % 04/19/2024 49  % Final   Neutro Abs 04/19/2024 3.0  1.7 - 7.7 K/uL Final   Lymphocytes Relative 04/19/2024 34  % Final   Lymphs Abs 04/19/2024 2.1  0.7 - 4.0 K/uL Final   Monocytes Relative 04/19/2024 12  % Final   Monocytes Absolute 04/19/2024 0.8  0.1 - 1.0 K/uL Final   Eosinophils Relative 04/19/2024 3  % Final   Eosinophils Absolute 04/19/2024 0.2  0.0 - 0.5 K/uL Final   Basophils Relative 04/19/2024 1  % Final   Basophils Absolute 04/19/2024 0.1  0.0 - 0.1 K/uL Final   Immature Granulocytes 04/19/2024 1  % Final   Abs Immature Granulocytes 04/19/2024 0.06  0.00 - 0.07 K/uL Final   Performed at [facility] Lab, [address],  [place], [place] [address]   Sodium 04/19/2024 139  135 - 145 mmol/L Final   Potassium 04/19/2024 2.8 (L)  3.5 - 5.1 mmol/L Final   DELTA CHECK NOTED   Chloride 04/19/2024 102  98 - 111 mmol/L Final   CO2 04/19/2024 26  22 - 32 mmol/L Final   Glucose, Bld 04/19/2024 89  70 - 99 mg/dL Final   Glucose reference range applies only to samples taken after fasting for at least 8 hours.   BUN 04/19/2024 <5 (L)  6 - 20 mg/dL Final   Creatinine, Ser 04/19/2024 0.83  0.61 - 1.24 mg/dL Final   Calcium  04/19/2024 9.2  8.9 - 10.3 mg/dL Final   Total Protein 92/88/7974 6.0 (L)  6.5 - 8.1 g/dL Final   Albumin 92/88/7974 3.4 (L)  3.5 - 5.0 g/dL Final   AST 92/88/7974 31  15 - 41 U/L Final   ALT 04/19/2024 26  0 - 44 U/L Final   Alkaline Phosphatase 04/19/2024 38  38 - 126 U/L Final   Total Bilirubin 04/19/2024 2.4 (H)  0.0 - 1.2 mg/dL Final   GFR, Estimated 04/19/2024 >60  >60 mL/min Final   Comment: (NOTE) Calculated using the CKD-EPI Creatinine Equation (2021)    Anion gap 04/19/2024 11  5 - 15 Final   Performed at [facility], [address], [place], [place] [address]   Magnesium  04/19/2024 1.6 (L)  1.7 - 2.4 mg/dL Final   Performed at [facility], [address], [place], [place] [address]   MRSA by PCR Next Gen 04/18/2024 NOT DETECTED  NOT DETECTED Final   Comment: (NOTE) The GeneXpert MRSA Assay (FDA approved for NASAL specimens only), is one component of a comprehensive MRSA colonization surveillance program. It is not intended to diagnose MRSA infection nor to guide or monitor treatment for MRSA infections. Test performance is not FDA approved in patients less than 6 years old. Performed at [facility], [address] [place], [place] [address]    HCV Ab 04/18/2024 NON REACTIVE  NON REACTIVE Final   Comment: (NOTE) Nonreactive HCV antibody screen is consistent with no HCV infections,  unless recent infection is suspected or other evidence exists to indicate HCV  infection.  Performed at [facility], [address], [place], [place] [address]    Hepatitis B Surface Ag 04/18/2024 NON REACTIVE  NON REACTIVE Final   Performed at [facility], [address] [address], [place], [place] [address]   SURGICAL PATHOLOGY 04/19/2024    Final-Edited                   Value:SURGICAL PATHOLOGY CASE: [id number] PATIENT: Bary Voorhees Surgical Pathology Report     Clinical History: hematemesis, coffee ground emesis, R/O H. Pylori (cm)     FINAL MICROSCOPIC DIAGNOSIS:  A. STOMACH, ANTRUM AND BODY, BIOPSY: - Gastric antral mucosa with mild nonspecific reactive gastropathy - Gastric oxyntic mucosa with no specific histopathologic changes - Helicobacter pylori-like organisms are not identified on routine HE stain     GROSS DESCRIPTION:  Received in formalin are tan, soft tissue fragments that are submitted in toto. Number: 3 size: 0.2 cm, each blocks: 1 (KW, 04/19/2024)  Final Diagnosis performed by Katrine Muskrat, MD.   Electronically signed 04/22/2024 Technical component performed at [employer], [address] [place], [place] [address].  Professional component performed at [facility], [address] [place], [place] [address].  Immunohistochemistry Technical component (if [employer]) was performed at [employer]. [address], [place], [place] [address].   IMMUNOHISTOCHEMISTRY DISCLAIMER (if applicable): Some of these immunohistochemical stains may have been developed and the performance characteristics determine by [facility]. Some may not have been cleared or approved by the U.S. Food and Drug Administration. The FDA has determined that such clearance or approval is not necessary. This test is used for clinical purposes. It should not be regarded as investigational or for research. This laboratory is certified under the  Clinical Laboratory Improvement Amendments of 1988 (CLIA-88) as qualified to perform high complexity clinical laboratory testing.  The controls stained appropriately.   IHC stains are performed on formalin fixed, paraffin embedded tissue using a 3,3diaminobenzidine (DAB) chromogen and Leica Bond Autostainer System. The staining intensity of the nucleus is score manually and is reported                          as the percentage of tumor cell nuclei demonstrating specific nuclear staining. The specimens are fixed in 10% Neutral Formalin for at least 6 hours and up to 72hrs. These tests are validated on decalcified tissue. Results should be interpreted with caution given the possibility of false negative results on decalcified specimens. Antibody Clones are as follows ER-clone 80F, PR-clone 16, Ki67- clone MM1. Some of these immunohistochemical stains may have been developed and the performance characteristics determined by [facility].   Admission on 03/31/2024, Discharged on 04/02/2024  Component Date Value Ref Range Status   Salicylate Lvl 03/31/2024 <7.0 (L)  7.0 - 30.0 mg/dL Final   Performed at [facility], [address], [place], [place] [address]   Acetaminophen  (Tylenol ), Serum 03/31/2024 <10 (L)  10 - 30 ug/mL Final   Comment: (NOTE) Therapeutic concentrations vary significantly. A range of 10-30 ug/mL  may be an effective concentration for many patients. However, some  are best treated at concentrations outside of this range. Acetaminophen  concentrations >150 ug/mL at 4 hours after ingestion  and >50 ug/mL at 12 hours after ingestion are often associated with  toxic reactions.  Performed at [facility], [address] [place], [place] [address]    Alcohol, Ethyl (B) 03/31/2024 130 (H)  <15 mg/dL Final   Comment: (  NOTE) For medical purposes only. Performed at [facility], [address], [place], [place] [address]    Opiates 03/31/2024 NONE DETECTED  NONE DETECTED Final   Cocaine 03/31/2024 NONE DETECTED  NONE DETECTED Final   Benzodiazepines 03/31/2024 POSITIVE (A)  NONE DETECTED Final   Amphetamines 03/31/2024 NONE DETECTED  NONE DETECTED Final   Tetrahydrocannabinol 03/31/2024 POSITIVE (A)  NONE DETECTED Final   Barbiturates 03/31/2024 NONE DETECTED  NONE DETECTED Final   Comment: (NOTE) DRUG SCREEN FOR MEDICAL PURPOSES ONLY.  IF CONFIRMATION IS NEEDED FOR ANY PURPOSE, NOTIFY LAB WITHIN 5 DAYS.  LOWEST DETECTABLE LIMITS FOR URINE DRUG SCREEN Drug Class                     Cutoff (ng/mL) Amphetamine and metabolites    1000 Barbiturate and metabolites    200 Benzodiazepine                 200 Opiates and metabolites        300 Cocaine and metabolites        300 THC                            50 Performed at [facility], [address] [place], [place] [address]    WBC 03/31/2024 8.4  4.0 - 10.5 K/uL Final   RBC 03/31/2024 4.96  4.22 - 5.81 MIL/uL Final   Hemoglobin 03/31/2024 16.5  13.0 - 17.0 g/dL Final   HCT 93/77/7974 48.5  39.0 - 52.0 % Final   MCV 03/31/2024 97.8  80.0 - 100.0 fL Final   MCH 03/31/2024 33.3  26.0 - 34.0 pg Final   MCHC 03/31/2024 34.0  30.0 - 36.0 g/dL Final   RDW 93/77/7974 12.8  11.5 - 15.5 % Final   Platelets 03/31/2024 306  150 - 400 K/uL Final   nRBC 03/31/2024 0.0  0.0 - 0.2 % Final   Neutrophils Relative % 03/31/2024 58  % Final   Neutro Abs 03/31/2024 4.9  1.7 - 7.7 K/uL Final   Lymphocytes Relative 03/31/2024 32  % Final   Lymphs Abs 03/31/2024 2.7  0.7 - 4.0 K/uL Final   Monocytes Relative 03/31/2024 7  % Final   Monocytes Absolute 03/31/2024 0.6  0.1 - 1.0 K/uL Final   Eosinophils Relative 03/31/2024 1  % Final   Eosinophils Absolute 03/31/2024 0.1  0.0 - 0.5 K/uL Final   Basophils Relative 03/31/2024 1  % Final   Basophils Absolute 03/31/2024 0.1  0.0 - 0.1 K/uL Final   Immature Granulocytes  03/31/2024 1  % Final   Abs Immature Granulocytes 03/31/2024 0.09 (H)  0.00 - 0.07 K/uL Final   Performed at [facility], [address], [place], [place] [address]   Sodium 03/31/2024 137  135 - 145 mmol/L Final   Potassium 03/31/2024 3.5  3.5 - 5.1 mmol/L Final   Chloride 03/31/2024 103  98 - 111 mmol/L Final   CO2 03/31/2024 20 (L)  22 - 32 mmol/L Final   Glucose, Bld 03/31/2024 156 (H)  70 - 99 mg/dL Final   Glucose reference range applies only to samples taken after fasting for at least 8 hours.   BUN 03/31/2024 18  6 - 20 mg/dL Final   Creatinine, Ser 03/31/2024 0.92  0.61 - 1.24 mg/dL Final   Calcium  03/31/2024 8.8 (L)  8.9 - 10.3 mg/dL Final   Total  Protein 03/31/2024 6.7  6.5 - 8.1 g/dL Final   Albumin 93/77/7974 3.8  3.5 - 5.0 g/dL Final   AST 93/77/7974 30  15 - 41 U/L Final   ALT 03/31/2024 24  0 - 44 U/L Final   Alkaline Phosphatase 03/31/2024 48  38 - 126 U/L Final   Total Bilirubin 03/31/2024 1.0  0.0 - 1.2 mg/dL Final   GFR, Estimated 03/31/2024 >60  >60 mL/min Final   Comment: (NOTE) Calculated using the CKD-EPI Creatinine Equation (2021)    Anion gap 03/31/2024 14  5 - 15 Final   Performed at [facility], [address], [place], [place] [address]  Admission on 03/11/2024, Discharged on 03/12/2024  Component Date Value Ref Range Status   WBC 03/11/2024 7.3  4.0 - 10.5 K/uL Final   RBC 03/11/2024 5.72  4.22 - 5.81 MIL/uL Final   Hemoglobin 03/11/2024 19.0 (H)  13.0 - 17.0 g/dL Final   HCT 93/97/7974 54.7 (H)  39.0 - 52.0 % Final   MCV 03/11/2024 95.6  80.0 - 100.0 fL Final   MCH 03/11/2024 33.2  26.0 - 34.0 pg Final   MCHC 03/11/2024 34.7  30.0 - 36.0 g/dL Final   RDW 93/97/7974 12.3  11.5 - 15.5 % Final   Platelets 03/11/2024 359  150 - 400 K/uL Final   nRBC 03/11/2024 0.0  0.0 - 0.2 % Final   Neutrophils Relative % 03/11/2024 52  % Final   Neutro Abs 03/11/2024 3.8  1.7 - 7.7 K/uL Final   Lymphocytes Relative 03/11/2024  35  % Final   Lymphs Abs 03/11/2024 2.6  0.7 - 4.0 K/uL Final   Monocytes Relative 03/11/2024 10  % Final   Monocytes Absolute 03/11/2024 0.7  0.1 - 1.0 K/uL Final   Eosinophils Relative 03/11/2024 1  % Final   Eosinophils Absolute 03/11/2024 0.1  0.0 - 0.5 K/uL Final   Basophils Relative 03/11/2024 1  % Final   Basophils Absolute 03/11/2024 0.1  0.0 - 0.1 K/uL Final   Immature Granulocytes 03/11/2024 1  % Final   Abs Immature Granulocytes 03/11/2024 0.05  0.00 - 0.07 K/uL Final   Performed at [facility], [address], [place], [place] [address]   Sodium 03/11/2024 137  135 - 145 mmol/L Final   Potassium 03/11/2024 3.8  3.5 - 5.1 mmol/L Final   Chloride 03/11/2024 95 (L)  98 - 111 mmol/L Final   CO2 03/11/2024 25  22 - 32 mmol/L Final   Glucose, Bld 03/11/2024 95  70 - 99 mg/dL Final   Glucose reference range applies only to samples taken after fasting for at least 8 hours.   BUN 03/11/2024 7  6 - 20 mg/dL Final   Creatinine, Ser 03/11/2024 1.10  0.61 - 1.24 mg/dL Final   Calcium  03/11/2024 10.4 (H)  8.9 - 10.3 mg/dL Final   Total Protein 93/97/7974 7.7  6.5 - 8.1 g/dL Final   Albumin 93/97/7974 4.5  3.5 - 5.0 g/dL Final   AST 93/97/7974 130 (H)  15 - 41 U/L Final   ALT 03/11/2024 109 (H)  0 - 44 U/L Final   Alkaline Phosphatase 03/11/2024 59  38 - 126 U/L Final   Total Bilirubin 03/11/2024 1.6 (H)  0.0 - 1.2 mg/dL Final   GFR, Estimated 03/11/2024 >60  >60 mL/min Final   Comment: (NOTE) Calculated using the CKD-EPI Creatinine Equation (2021)    Anion gap 03/11/2024 17 (H)  5 - 15 Final  Performed at [facility], [address], [place], [place] [address]   Alcohol, Ethyl (B) 03/11/2024 195 (H)  <15 mg/dL Final   Comment: (NOTE) For medical purposes only. Performed at [facility], [address], [place], [place] [address]    TSH 03/11/2024 2.348  0.350 - 4.500 uIU/mL Final   Comment: Performed by a 3rd Generation assay with a functional sensitivity of  <=0.01 uIU/mL. Performed at [facility], [address], [place], [place] [address]    POC Amphetamine UR 03/11/2024 None Detected  NONE DETECTED (Cut Off Level 1000 ng/mL) Final   POC Secobarbital (BAR) 03/11/2024 None Detected  NONE DETECTED (Cut Off Level 300 ng/mL) Final   POC Buprenorphine (BUP) 03/11/2024 None Detected  NONE DETECTED (Cut Off Level 10 ng/mL) Final   POC Oxazepam (BZO) 03/11/2024 Positive (A)  NONE DETECTED (Cut Off Level 300 ng/mL) Final   POC Cocaine UR 03/11/2024 None Detected  NONE DETECTED (Cut Off Level 300 ng/mL) Final   POC Methamphetamine UR 03/11/2024 None Detected  NONE DETECTED (Cut Off Level 1000 ng/mL) Final   POC Morphine  03/11/2024 None Detected  NONE DETECTED (Cut Off Level 300 ng/mL) Final   POC Methadone UR 03/11/2024 None Detected  NONE DETECTED (Cut Off Level 300 ng/mL) Final   POC Oxycodone UR 03/11/2024 None Detected  NONE DETECTED (Cut Off Level 100 ng/mL) Final   POC Marijuana UR 03/11/2024 Positive (A)  NONE DETECTED (Cut Off Level 50 ng/mL) Final  Admission on 02/25/2024, Discharged on 02/25/2024  Component Date Value Ref Range Status   Influenza A, POC 02/25/2024 Positive (A)  Negative Final   Influenza B, POC 02/25/2024 Negative  Negative Final    Allergies: Reglan  [metoclopramide ]  Medications:  Facility Ordered Medications  Medication   alum & mag hydroxide-simeth (MAALOX/MYLANTA) 200-200-20 MG/5ML suspension 30 mL   magnesium  hydroxide (MILK OF MAGNESIA) suspension 30 mL   OLANZapine  zydis (ZYPREXA ) disintegrating tablet 5 mg   OLANZapine  (ZYPREXA ) injection 5 mg   OLANZapine  (ZYPREXA ) injection 10 mg   [COMPLETED] thiamine  (VITAMIN B1) injection 100 mg   thiamine  (VITAMIN B1) tablet 100 mg   multivitamin with minerals tablet 1 tablet   [EXPIRED] loperamide  (IMODIUM ) capsule 2-4 mg   [EXPIRED] ondansetron  (ZOFRAN -ODT) disintegrating tablet 4 mg   [COMPLETED] cloNIDine  (CATAPRES ) tablet 0.1 mg   [COMPLETED] LORazepam   (ATIVAN ) tablet 1 mg   Followed by   [COMPLETED] LORazepam  (ATIVAN ) tablet 1 mg   Followed by   [COMPLETED] LORazepam  (ATIVAN ) tablet 1 mg   Followed by   NOREEN ON 05/01/2024] LORazepam  (ATIVAN ) tablet 1 mg   pantoprazole  (PROTONIX ) EC tablet 20 mg   propranolol  (INDERAL ) tablet 10 mg   hydrOXYzine  (ATARAX ) tablet 50 mg   cloNIDine  (CATAPRES ) tablet 0.1 mg   ibuprofen  (ADVIL ) tablet 400 mg   mirtazapine  (REMERON ) tablet 7.5 mg   PTA Medications  Medication Sig   propranolol  (INDERAL ) 10 MG tablet Take 1 tablet (10 mg total) by mouth every 6 (six) hours as needed for up to 15 days (anxiety).   pantoprazole  (PROTONIX ) 40 MG tablet Take 1 tablet (40 mg total) by mouth 2 (two) times daily before a meal.   thiamine  (VITAMIN B1) 100 MG tablet Take 1 tablet (100 mg total) by mouth daily.   sertraline  (ZOLOFT ) 50 MG tablet Take 1 tablet (50 mg total) by mouth daily. (Patient not taking: Reported on 04/18/2024)   Long Term Goals: Improvement in symptoms so as ready for discharge  Short Term Goals: Patient will verbalize feelings in meetings with treatment team members., Patient will attend at least of 50% of the groups daily., Pt will complete the PHQ9 on admission, day 3 and discharge., Patient will participate in completing the [place] Suicide Severity Rating Scale, Patient will score a low risk of violence for 24 hours prior to discharge, and Patient will take medications as prescribed daily.  Medical Decision Making  - Admitted to the facility based crisis center -Baseline labs ordered including hemoglobin A1c, TSH, lipid panel.  BAL 257 on admission.  Lactic acid was 3.4, we will recheck to ensure that it has triggered downwards.  We will also recheck CK which was 432 to 2 days ago. LFTs slightly elevated, but might be due to alcoholism.  We will educate patient to follow this and with his PCP at discharge.  Also urging the following lab: Vitamins B12, vitamin D . EKG WNL.   Patient on the  following medications: -Continue Remeron  7.5 mg nightly for depressive symptoms and sleep-Zoloft  was discontinued due to concerns for it's sexual side effects by patient who states that SSRIs in the past have inhibited his sex drive. -Continue Clonidine  0.1 mg for SBP>160 or DBP>100 Continue: -Ativan  taper for alcohol use disorder-See MAR for detailed order -Hydroxyzine  25 mg 3 times daily as needed for anxiety -Inderal  10 mg 3 times daily for GAD/tachycardia/hypertension -Agitation protocol: Ativan /Benadryl/Haldol  as needed-Please see MAR for detailed order -Oral thiamine  and MVI replacement  Recommendations  - Abstinence from substances encouraged -SW to look into options for outpatient SA treatment at discharge  -Monitor for signs of withdrawal via CIWAs as ordered -Q15 minute checks as per unit protocol -Attend unit group sessions as mandated -72 hr notice required if needing to be discharged  Donia Snell, NP 04/30/24  5:03 PM

## 2024-04-30 NOTE — Group Note (Signed)
 Group Topic: Communication  Group Date: 04/30/2024 Start Time: 2000 End Time: 2100 Facilitators: Joan Plowman B  Department: [facility]  Number of Participants: 5  Group Focus: abuse issues and activities of daily living skills Treatment Modality:  Individual Therapy Interventions utilized were leisure development Purpose: enhance coping skills, express feelings, relapse prevention strategies, and trigger / craving management  Name: Brett Sanford Date of Birth: 1991-09-10  MR: [id number]    Level of Participation: active Quality of Participation: attentive, cooperative, and initiates communication Interactions with others: gave feedback Mood/Affect: appropriate, brightens with interaction, and positive Triggers (if applicable): NA Cognition: coherent/clear Progress: Gaining insight Response: He has a plan that suits him and he wants to make sure he gets an appointment before leaving here for OP. Plan: patient will be encouraged to keep going to groups.   Patients Problems:  Patient Active Problem List   Diagnosis Date Noted   Alcohol use disorder 04/27/2024   Gastroesophageal reflux disease with esophagitis and hemorrhage 04/19/2024   Gastritis without bleeding 04/19/2024   Hematemesis 04/18/2024   Syncope 04/18/2024   LFT elevation 04/18/2024   Alcohol abuse 04/02/2024   Substance abuse (HCC) 04/01/2024   Alcohol use disorder, severe, dependence (HCC) 03/12/2024   MDD (major depressive disorder), recurrent episode, severe (HCC) 08/02/2023   Drug overdose, intentional self-harm, initial encounter (HCC) 10/07/2019   Alcoholic intoxication without complication (HCC)    Suicide attempt (HCC)    Viral illness 08/21/2019   Hypokalemia 06/23/2019   Attention deficit hyperactivity disorder (ADHD) 06/23/2019   Seizure-like activity (HCC) 06/23/2019   History of alcohol abuse 06/23/2019   Chronic migraine 06/23/2019   Diarrhea 06/23/2019

## 2024-04-30 NOTE — Group Note (Signed)
 Group Topic: Relapse and Recovery  Group Date: 04/30/2024 Start Time: 9089 End Time: 1000 Facilitators: Herold Lajuana NOVAK, RN  Department: [facility]  Number of Participants: 6  Group Focus: coping skills Treatment Modality:  Leisure Development Interventions utilized were leisure development Purpose: relapse prevention strategies  Name: Brett Sanford Date of Birth: 1991/07/08  MR: [id number]    Level of Participation: active Quality of Participation: cooperative Interactions with others: gave feedback Mood/Affect: appropriate Triggers (if applicable): none identified Cognition: coherent/clear Progress: Significant Response: My strategy for distraction will be to go hiking. I love the outdoors. I get to clear my mind Plan: patient will be encouraged to utilize recognized coping skills to manage cravings  Patients Problems:  Patient Active Problem List   Diagnosis Date Noted   Alcohol use disorder 04/27/2024   Gastroesophageal reflux disease with esophagitis and hemorrhage 04/19/2024   Gastritis without bleeding 04/19/2024   Hematemesis 04/18/2024   Syncope 04/18/2024   LFT elevation 04/18/2024   Alcohol abuse 04/02/2024   Substance abuse (HCC) 04/01/2024   Alcohol use disorder, severe, dependence (HCC) 03/12/2024   MDD (major depressive disorder), recurrent episode, severe (HCC) 08/02/2023   Drug overdose, intentional self-harm, initial encounter (HCC) 10/07/2019   Alcoholic intoxication without complication (HCC)    Suicide attempt (HCC)    Viral illness 08/21/2019   Hypokalemia 06/23/2019   Attention deficit hyperactivity disorder (ADHD) 06/23/2019   Seizure-like activity (HCC) 06/23/2019   History of alcohol abuse 06/23/2019   Chronic migraine 06/23/2019   Diarrhea 06/23/2019

## 2024-04-30 NOTE — ED Notes (Signed)
 Pt asleep at this time, CIWA assessment could not be completed.

## 2024-04-30 NOTE — ED Notes (Signed)
 Patient A&Ox4. Denies intent to harm self/others when asked. Denies A/VH. Patient denies any physical complaints when asked. No acute distress noted. Support and encouragement provided. Routine safety checks conducted according to facility protocol. Encouraged patient to notify staff if thoughts of harm toward self or others arise. Patient verbalize understanding and agreement. Will continue to monitor for safety.

## 2024-05-01 DIAGNOSIS — F1220 Cannabis dependence, uncomplicated: Secondary | ICD-10-CM | POA: Diagnosis not present

## 2024-05-01 DIAGNOSIS — I10 Essential (primary) hypertension: Secondary | ICD-10-CM | POA: Diagnosis not present

## 2024-05-01 DIAGNOSIS — F411 Generalized anxiety disorder: Secondary | ICD-10-CM | POA: Diagnosis not present

## 2024-05-01 DIAGNOSIS — F332 Major depressive disorder, recurrent severe without psychotic features: Secondary | ICD-10-CM | POA: Diagnosis not present

## 2024-05-01 LAB — CK TOTAL AND CKMB (NOT AT ARMC)
CK, MB: 1.8 ng/mL (ref 0.5–5.0)
Total CK: 118 U/L (ref 49–397)

## 2024-05-01 LAB — VITAMIN D 25 HYDROXY (VIT D DEFICIENCY, FRACTURES): Vit D, 25-Hydroxy: 25.48 ng/mL — ABNORMAL LOW (ref 30–100)

## 2024-05-01 LAB — VITAMIN B12: Vitamin B-12: 656 pg/mL (ref 180–914)

## 2024-05-01 MED ORDER — VITAMIN B-1 100 MG PO TABS: 100.0000 mg | ORAL_TABLET | Freq: Every day | ORAL | 0 refills | Status: AC

## 2024-05-01 MED ORDER — ADULT MULTIVITAMIN W/MINERALS CH: 1.0000 | ORAL_TABLET | Freq: Every day | ORAL | Status: AC

## 2024-05-01 MED ORDER — MIRTAZAPINE 7.5 MG PO TABS: 7.5000 mg | ORAL_TABLET | Freq: Every day | ORAL | 0 refills | Status: AC

## 2024-05-01 MED ORDER — PANTOPRAZOLE SODIUM 20 MG PO TBEC: 20.0000 mg | DELAYED_RELEASE_TABLET | Freq: Every day | ORAL | 0 refills | Status: AC

## 2024-05-01 MED ORDER — PROPRANOLOL HCL 10 MG PO TABS: 10.0000 mg | ORAL_TABLET | Freq: Three times a day (TID) | ORAL | 0 refills | Status: AC

## 2024-05-01 MED ORDER — HYDROXYZINE HCL 50 MG PO TABS: 50.0000 mg | ORAL_TABLET | Freq: Three times a day (TID) | ORAL | 0 refills | Status: AC | PRN

## 2024-05-01 NOTE — Group Note (Signed)
 Group Topic: Recovery Basics  Group Date: 05/01/2024 Start Time: 1000 End Time: 1100 Facilitators: Nicholaus Arlyne BIRCH, NT  Department: [facility]  Number of Participants: 5  Group Focus: substance abuse education Treatment Modality:  Spiritual Interventions utilized were clarification Purpose: increase insight  Name: Brett Sanford Date of Birth: 04/23/1991  MR: [id number]    Level of Participation: active Quality of Participation: cooperative Interactions with others: open Mood/Affect: appropriate Triggers (if applicable): none Cognition: coherent/clear Progress: Gaining insight Response: positive Plan: patient will be encouraged to continue recovery process  Patients Problems:  Patient Active Problem List   Diagnosis Date Noted   Alcohol use disorder 04/27/2024   Gastroesophageal reflux disease with esophagitis and hemorrhage 04/19/2024   Gastritis without bleeding 04/19/2024   Hematemesis 04/18/2024   Syncope 04/18/2024   LFT elevation 04/18/2024   Alcohol abuse 04/02/2024   Substance abuse (HCC) 04/01/2024   Alcohol use disorder, severe, dependence (HCC) 03/12/2024   MDD (major depressive disorder), recurrent episode, severe (HCC) 08/02/2023   Drug overdose, intentional self-harm, initial encounter (HCC) 10/07/2019   Alcoholic intoxication without complication (HCC)    Suicide attempt (HCC)    Viral illness 08/21/2019   Hypokalemia 06/23/2019   Attention deficit hyperactivity disorder (ADHD) 06/23/2019   Seizure-like activity (HCC) 06/23/2019   History of alcohol abuse 06/23/2019   Chronic migraine 06/23/2019   Diarrhea 06/23/2019

## 2024-05-01 NOTE — ED Notes (Signed)
 Patient A&Ox4. Denies intent to harm self/others when asked. Denies A/VH. Patient denies any physical complaints when asked. No acute distress noted. Support and encouragement provided. Routine safety checks conducted according to facility protocol. Encouraged patient to notify staff if thoughts of harm toward self or others arise. Patient verbalize understanding and agreement. Will continue to monitor for safety.

## 2024-05-01 NOTE — Discharge Instructions (Signed)
 You have a follow up appointment scheduled at [facility] for Friday July 25 @ 1:00 for [facility] evaluation. You are welcome to come earlier for this appointment. Please bring your ID and insurance card.  [facility]  [address] [place], [place] [address]  Outpatient Hours: Mon-Fri 8AM to 5PM  Phone: [phone] Fax: [phone]  [facility]) Hours: 24/7 Phone: [phone] Fax: [phone]

## 2024-05-01 NOTE — ED Notes (Signed)
 Patient is sleeping. Respirations equal and unlabored. No change in assessment or acuity. Routine safety checks conducted according to facility protocol.

## 2024-05-01 NOTE — ED Notes (Signed)
Pt is sleeping, no acute distress noted.

## 2024-05-01 NOTE — Discharge Planning (Signed)
 SW made call to [facility] to schedule patients follow up SAIOP evaluation for Friday July, 25 @ 1:00. Instructions will be provided in patients AVS upon dc which is anticipated for Thursday. Will continue to follow.

## 2024-05-01 NOTE — ED Notes (Signed)
 Discharged

## 2024-05-01 NOTE — ED Provider Notes (Addendum)
 FBC/OBS ASAP Discharge Summary  Date and Time: 05/01/2024 11:59 AM  Name: Brett Sanford  MRN:  [id number]   Discharge Diagnoses:  Final diagnoses:  Alcohol use disorder  GAD (generalized anxiety disorder)  Severe episode of recurrent major depressive disorder, without psychotic features (HCC)   HPI: Brett Sanford is a 33 y.o. Caucasian male with a reported prior mental health history of MDD, ADHD, GAD & alcohol use d/o who presented to the [facility] on 06/28/24 seeking assistance with regaining his sobriety from alcohol. Pt reported during assessment that his last drink consisted of a pint of Vodka prior to presenting at this urgent care.  Stay Summary:   Patient was [facility] to the [facility] at the [facility] center with a goal of detoxification from alcohol, and possible rehabilitation placement thereafter. Patient was started on an Ativan  taper for alcohol related withdrawal symptoms. Taper ended earlier today morning and pt continues to exhibit no withdrawal symptoms.  His stay at this facility has been uneventful, and he is being discharged on the following medications: -Continue Remeron  7.5 mg nightly for depressive symptoms and sleep-Zoloft  was discontinued during his stay at the [facility] due to concerns for it's sexual side effects by patient who states that SSRIs in the past have inhibited his sex drive. -Ativan  taper for alcohol use disorder ended  earlier today morning (7/23). -Hydroxyzine  25 mg 3 times daily as needed for anxiety -Inderal  10 mg 3 times daily for GAD/tachycardia/hypertension -Continue Protonix  20 mg daily for GERD -Continue Hydroxyzine  50 mg TID PRN for anxiety -Continue Multivitamins and thiamine  Patient has been provided with a 7 day supply of his medications by the [facility] pharmacist due to him not having insurance coverage for his medications.  Patient is assessed prior to discharge, and presents with a euthymic mood,  attention to personal hygiene and grooming is fair, eye contact is good, speech is clear & coherent. Thought contents are organized and logical, and pt currently denies SI/HI/AVH or paranoia. There is no evidence of delusional thoughts.  There are no overt signs of psychosis and pt denies first rank symptoms.  Labs were reviewed with the patient, and abnormal results were discussed with the patient.  The patient is able to verbalize their individual safety plan to this provider.  # It is recommended to the patient to continue psychiatric medications as prescribed, after discharge from the hospital.    # It is recommended to the patient to follow up with your outpatient psychiatric provider and PCP.  # It was discussed with the patient, the impact of alcohol, drugs, tobacco have been there overall psychiatric and medical wellbeing, and total abstinence from substance use was recommended the patient.ed.  # Prescriptions provided or sent directly to preferred pharmacy at discharge. Patient agreeable to plan. Given opportunity to ask questions. Appears to feel comfortable with discharge.    # In the event of worsening symptoms, the patient is instructed to call the crisis hotline (988), 911 and or go to the nearest ED for appropriate evaluation and treatment of symptoms. To follow-up with primary care provider for other medical issues, concerns and or health care needs  # Patient was discharged home with a plan to follow up with his SAIOP as discussed with CSW.    Total Time spent with patient: 45 minutes  Past Psychiatric History: See H & P  Past Medical History: See H & P  Family History: See H & P  Family  Psychiatric History: See H & P  Social History: See H & P  Tobacco Cessation:  A prescription for an FDA-approved tobacco cessation medication was offered at discharge and the patient refused  Current Medications:  Current Facility-Administered Medications  Medication Dose Route  Frequency Provider Last Rate Last Admin   alum & mag hydroxide-simeth (MAALOX/MYLANTA) 200-200-20 MG/5ML suspension 30 mL  30 mL Oral Q4H PRN Randall, Veronique M, NP       cloNIDine  (CATAPRES ) tablet 0.1 mg  0.1 mg Oral BID PRN Tex Drilling, NP       hydrOXYzine  (ATARAX ) tablet 50 mg  50 mg Oral TID PRN Cole Kandi BROCKS, MD   50 mg at 04/30/24 2127   ibuprofen  (ADVIL ) tablet 400 mg  400 mg Oral Q6H PRN Tex Drilling, NP   400 mg at 05/01/24 9047   magnesium  hydroxide (MILK OF MAGNESIA) suspension 30 mL  30 mL Oral Daily PRN Randall Starlyn HERO, NP       mirtazapine  (REMERON ) tablet 7.5 mg  7.5 mg Oral QHS Karey Suthers, NP   7.5 mg at 04/30/24 2127   multivitamin with minerals tablet 1 tablet  1 tablet Oral Daily Byungura, Veronique M, NP   1 tablet at 05/01/24 9080   OLANZapine  (ZYPREXA ) injection 10 mg  10 mg Intramuscular TID PRN Randall Starlyn HERO, NP       OLANZapine  (ZYPREXA ) injection 5 mg  5 mg Intramuscular TID PRN Randall Starlyn HERO, NP       OLANZapine  zydis (ZYPREXA ) disintegrating tablet 5 mg  5 mg Oral TID PRN Byungura, Veronique M, NP       pantoprazole  (PROTONIX ) EC tablet 20 mg  20 mg Oral Daily Aariv Medlock, NP   20 mg at 05/01/24 9081   propranolol  (INDERAL ) tablet 10 mg  10 mg Oral TID Tex Drilling, NP   10 mg at 05/01/24 9080   thiamine  (VITAMIN B1) tablet 100 mg  100 mg Oral Daily Byungura, Veronique M, NP   100 mg at 05/01/24 9080   Current Outpatient Medications  Medication Sig Dispense Refill   hydrOXYzine  (ATARAX ) 50 MG tablet Take 1 tablet (50 mg total) by mouth 3 (three) times daily as needed for anxiety. 30 tablet 0   mirtazapine  (REMERON ) 7.5 MG tablet Take 1 tablet (7.5 mg total) by mouth at bedtime. 30 tablet 0   [START ON 05/02/2024] Multiple Vitamin (MULTIVITAMIN WITH MINERALS) TABS tablet Take 1 tablet by mouth daily.     [START ON 05/02/2024] pantoprazole  (PROTONIX ) 20 MG tablet Take 1 tablet (20 mg total) by mouth daily. 30 tablet 0    propranolol  (INDERAL ) 10 MG tablet Take 1 tablet (10 mg total) by mouth 3 (three) times daily. 90 tablet 0   [START ON 05/02/2024] thiamine  (VITAMIN B-1) 100 MG tablet Take 1 tablet (100 mg total) by mouth daily. 30 tablet 0    PTA Medications:  Facility Ordered Medications  Medication   alum & mag hydroxide-simeth (MAALOX/MYLANTA) 200-200-20 MG/5ML suspension 30 mL   magnesium  hydroxide (MILK OF MAGNESIA) suspension 30 mL   OLANZapine  zydis (ZYPREXA ) disintegrating tablet 5 mg   OLANZapine  (ZYPREXA ) injection 5 mg   OLANZapine  (ZYPREXA ) injection 10 mg   [COMPLETED] thiamine  (VITAMIN B1) injection 100 mg   thiamine  (VITAMIN B1) tablet 100 mg   multivitamin with minerals tablet 1 tablet   [EXPIRED] loperamide  (IMODIUM ) capsule 2-4 mg   [EXPIRED] ondansetron  (ZOFRAN -ODT) disintegrating tablet 4 mg   [COMPLETED] cloNIDine  (CATAPRES ) tablet 0.1 mg   [  COMPLETED] LORazepam  (ATIVAN ) tablet 1 mg   Followed by   [COMPLETED] LORazepam  (ATIVAN ) tablet 1 mg   Followed by   [COMPLETED] LORazepam  (ATIVAN ) tablet 1 mg   Followed by   [COMPLETED] LORazepam  (ATIVAN ) tablet 1 mg   pantoprazole  (PROTONIX ) EC tablet 20 mg   propranolol  (INDERAL ) tablet 10 mg   hydrOXYzine  (ATARAX ) tablet 50 mg   cloNIDine  (CATAPRES ) tablet 0.1 mg   ibuprofen  (ADVIL ) tablet 400 mg   mirtazapine  (REMERON ) tablet 7.5 mg   PTA Medications  Medication Sig   hydrOXYzine  (ATARAX ) 50 MG tablet Take 1 tablet (50 mg total) by mouth 3 (three) times daily as needed for anxiety.   mirtazapine  (REMERON ) 7.5 MG tablet Take 1 tablet (7.5 mg total) by mouth at bedtime.   [START ON 05/02/2024] Multiple Vitamin (MULTIVITAMIN WITH MINERALS) TABS tablet Take 1 tablet by mouth daily.   [START ON 05/02/2024] pantoprazole  (PROTONIX ) 20 MG tablet Take 1 tablet (20 mg total) by mouth daily.   propranolol  (INDERAL ) 10 MG tablet Take 1 tablet (10 mg total) by mouth 3 (three) times daily.   [START ON 05/02/2024] thiamine  (VITAMIN B-1) 100 MG  tablet Take 1 tablet (100 mg total) by mouth daily.       04/30/2024    5:03 PM 04/28/2024    3:02 PM 04/27/2024    9:13 AM  Depression screen PHQ 2/9  Decreased Interest 1 1 1   Down, Depressed, Hopeless 1 1 1   PHQ - 2 Score 2 2 2   Altered sleeping 1 1 1   Tired, decreased energy 1 1 1   Change in appetite 0 1 0  Feeling bad or failure about yourself  0 1 1  Trouble concentrating 0 1 1  Moving slowly or fidgety/restless  1 1  Suicidal thoughts 0 1 0  PHQ-9 Score 4 9 7   Difficult doing work/chores  Somewhat difficult Very difficult    Flowsheet Row ED from 04/27/2024 in [facility] Most recent reading at 04/27/2024 10:15 AM ED from 04/27/2024 in [facility] Most recent reading at 04/27/2024  9:13 AM ED from 04/26/2024 in [facility] Most recent reading at 04/26/2024  1:09 AM  C-SSRS RISK CATEGORY High Risk Error: Question 2 not populated Error: Q3, 4, or 5 should not be populated when Q2 is No    Musculoskeletal  Strength & Muscle Tone: within normal limits Gait & Station: normal Patient leans: N/A  Psychiatric Specialty Exam  Presentation  General Appearance:  Appropriate for Environment  Eye Contact: Fair  Speech: Clear and Coherent  Speech Volume: Normal  Handedness: Right   Mood and Affect  Mood: Euthymic  Affect: Congruent   Thought Process  Thought Processes: Coherent  Descriptions of Associations:Intact  Orientation:Full (Time, Place and Person)  Thought Content:Logical; WDL  Diagnosis of Schizophrenia or Schizoaffective disorder in past: No    Hallucinations:Hallucinations: None  Ideas of Reference:None  Suicidal Thoughts:Suicidal Thoughts: No  Homicidal Thoughts:Homicidal Thoughts: No   Sensorium  Memory: Immediate Good  Judgment: Good  Insight: Good   Executive Functions  Concentration: Good  Attention  Span: Good  Recall: Good  Fund of Knowledge: Good  Language: Good   Psychomotor Activity  Psychomotor Activity: Psychomotor Activity: Normal   Assets  Assets: Resilience   Sleep  Sleep: Sleep: Good  Estimated Sleeping Duration (Last 24 Hours): 6.00-7.75 hours  No data recorded  Physical Exam  Physical Exam Constitutional:  Appearance: Normal appearance.  Musculoskeletal:     Cervical back: Normal range of motion.  Skin:    General: Skin is warm.  Neurological:     General: No focal deficit present.     Mental Status: He is alert and oriented to person, place, and time.  Psychiatric:        Mood and Affect: Mood normal.        Behavior: Behavior normal.        Thought Content: Thought content normal.        Judgment: Judgment normal.    Review of Systems  Psychiatric/Behavioral:  Positive for depression (stable for discharge) and substance abuse. Negative for hallucinations, memory loss and suicidal ideas. The patient is nervous/anxious and has insomnia.   All other systems reviewed and are negative.  Blood pressure (!) 140/87, pulse 91, temperature 98.1 F (36.7 C), temperature source Oral, resp. rate 18, SpO2 100%. There is no height or weight on file to calculate BMI.  Demographic Factors:  Male and Low socioeconomic status  Loss Factors: Decrease in vocational status  Historical Factors: Family history of mental illness or substance abuse  Risk Reduction Factors:   Living with another person, especially a relative  Continued Clinical Symptoms:  Alcohol/Substance Abuse/Dependencies  Cognitive Features That Contribute To Risk:  None    Suicide Risk:  Mild:  No Suicidal ideation, no intent and no plan verbalized by patient.  There are no identifiable plans, no associated intent, mild dysphoria and related symptoms, good self-control (both objective and subjective assessment), few other risk factors, and identifiable protective factors,  including available and accessible social support.  Plan Of Care/Follow-up recommendations:  SAIOP treatment.  Donia Snell, NP 05/01/2024, 11:59 AM

## 2024-05-01 NOTE — ED Notes (Signed)
 Patient A&O x 4, ambulatory. Patient discharged in no acute distress. Patient denied SI/HI, A/VH upon discharge. Patient verbalized understanding of all discharge instructions reviewed on AVS via staff, to include follow up appointments, RX's and safety. Suicide safety plan completed and reviewed with Clinical research associate. A copy given to pt. Patient reported mood 10/10.  Pt belongings returned to patient from locker #10 intact. Patient escorted to lobby via staff for transport to destination. Safety maintained.

## 2024-05-01 NOTE — Group Note (Signed)
 Group Topic: Communication  Group Date: 05/01/2024 Start Time: 0900 End Time: 1000 Facilitators: Herold Lajuana NOVAK, RN  Department: [facility]  Number of Participants: 8  Group Focus: communication Treatment Modality:  Individual Therapy Interventions utilized were patient education Purpose: increase insight  Name: Brett Sanford Date of Birth: 11-22-1990  MR: [id number]      Patients Problems:  Level of Participation: active Quality of Participation: cooperative Interactions with others: gave feedback Mood/Affect: appropriate Triggers (if applicable): none identified Cognition: coherent/clear and goal directed Progress: Significant Response: Pt verbalized understanding of all medications administered Plan: patient will be encouraged to remain med compliant throughout tx and to notify staff with any questions or concerns

## 2024-05-01 NOTE — ED Notes (Signed)
 Pt is in the dayroom watching TV with peers. Pt endorsed anxiety. According to pt , he is getting discharged tomorrow, he lives with his mom and mom will need him around so he won't want to be in an outpatient program. Pt denies SI/HI/AVH. Pt has no further complain.No acute distress noted.

## 2024-05-10 ENCOUNTER — Other Ambulatory Visit: Payer: Self-pay
